# Patient Record
Sex: Male | Born: 1970 | Race: White | Hispanic: No | Marital: Single | Smoking: Current every day smoker
Health system: Southern US, Community
[De-identification: ages and names within clinical notes are randomized; demographics above are authoritative.]

## PROBLEM LIST (undated history)

## (undated) DIAGNOSIS — K219 Gastro-esophageal reflux disease without esophagitis: Secondary | ICD-10-CM

## (undated) DIAGNOSIS — G893 Neoplasm related pain (acute) (chronic): Secondary | ICD-10-CM

## (undated) DIAGNOSIS — C099 Malignant neoplasm of tonsil, unspecified: Secondary | ICD-10-CM

## (undated) HISTORY — DX: Neoplasm related pain (acute) (chronic): G89.3

## (undated) HISTORY — DX: Malignant neoplasm of tonsil, unspecified: C09.9

---

## 2006-04-09 ENCOUNTER — Emergency Department: Payer: Self-pay | Admitting: Emergency Medicine

## 2019-05-06 ENCOUNTER — Emergency Department: Payer: Self-pay

## 2019-05-06 ENCOUNTER — Emergency Department
Admission: EM | Admit: 2019-05-06 | Discharge: 2019-05-06 | Disposition: A | Discharge status: Home / Self Care | Payer: Self-pay | Attending: Emergency Medicine | Admitting: Emergency Medicine

## 2019-05-06 ENCOUNTER — Other Ambulatory Visit: Payer: Self-pay

## 2019-05-06 DIAGNOSIS — F10920 Alcohol use, unspecified with intoxication, uncomplicated: Secondary | ICD-10-CM

## 2019-05-06 DIAGNOSIS — Y907 Blood alcohol level of 200-239 mg/100 ml: Secondary | ICD-10-CM | POA: Insufficient documentation

## 2019-05-06 LAB — URINE DRUG SCREEN, QUALITATIVE (ARMC ONLY)
Amphetamines, Ur Screen: NOT DETECTED
Barbiturates, Ur Screen: NOT DETECTED
Benzodiazepine, Ur Scrn: NOT DETECTED
Cannabinoid 50 Ng, Ur ~~LOC~~: POSITIVE — AB
Cocaine Metabolite,Ur ~~LOC~~: NOT DETECTED
MDMA (Ecstasy)Ur Screen: NOT DETECTED
Methadone Scn, Ur: NOT DETECTED
Opiate, Ur Screen: NOT DETECTED
Phencyclidine (PCP) Ur S: NOT DETECTED
Tricyclic, Ur Screen: NOT DETECTED

## 2019-05-06 LAB — CBC WITH DIFFERENTIAL/PLATELET
Abs Immature Granulocytes: 0.04 10*3/uL (ref 0.00–0.07)
Basophils Absolute: 0.1 10*3/uL (ref 0.0–0.1)
Basophils Relative: 1 %
Eosinophils Absolute: 0.2 10*3/uL (ref 0.0–0.5)
Eosinophils Relative: 2 %
HCT: 43.7 % (ref 39.0–52.0)
Hemoglobin: 15.7 g/dL (ref 13.0–17.0)
Immature Granulocytes: 1 %
Lymphocytes Relative: 33 %
Lymphs Abs: 2.7 10*3/uL (ref 0.7–4.0)
MCH: 33.7 pg (ref 26.0–34.0)
MCHC: 35.9 g/dL (ref 30.0–36.0)
MCV: 93.8 fL (ref 80.0–100.0)
Monocytes Absolute: 0.6 10*3/uL (ref 0.1–1.0)
Monocytes Relative: 8 %
Neutro Abs: 4.7 10*3/uL (ref 1.7–7.7)
Neutrophils Relative %: 55 %
Platelets: 223 10*3/uL (ref 150–400)
RBC: 4.66 MIL/uL (ref 4.22–5.81)
RDW: 12.4 % (ref 11.5–15.5)
WBC: 8.2 10*3/uL (ref 4.0–10.5)
nRBC: 0 % (ref 0.0–0.2)

## 2019-05-06 LAB — URINALYSIS, COMPLETE (UACMP) WITH MICROSCOPIC
Bacteria, UA: NONE SEEN
Bilirubin Urine: NEGATIVE
Glucose, UA: NEGATIVE mg/dL
Hgb urine dipstick: NEGATIVE
Ketones, ur: NEGATIVE mg/dL
Leukocytes,Ua: NEGATIVE
Nitrite: NEGATIVE
Protein, ur: NEGATIVE mg/dL
Specific Gravity, Urine: 1.002 — ABNORMAL LOW (ref 1.005–1.030)
Squamous Epithelial / HPF: NONE SEEN (ref 0–5)
WBC, UA: NONE SEEN WBC/hpf (ref 0–5)
pH: 6 (ref 5.0–8.0)

## 2019-05-06 LAB — COMPREHENSIVE METABOLIC PANEL
ALT: 15 U/L (ref 0–44)
AST: 24 U/L (ref 15–41)
Albumin: 4.3 g/dL (ref 3.5–5.0)
Alkaline Phosphatase: 58 U/L (ref 38–126)
Anion gap: 13 (ref 5–15)
BUN: 6 mg/dL (ref 6–20)
CO2: 22 mmol/L (ref 22–32)
Calcium: 8.3 mg/dL — ABNORMAL LOW (ref 8.9–10.3)
Chloride: 104 mmol/L (ref 98–111)
Creatinine, Ser: 0.75 mg/dL (ref 0.61–1.24)
GFR calc Af Amer: >60 mL/min (ref >60)
GFR calc non Af Amer: >60 mL/min (ref >60)
Glucose, Bld: 109 mg/dL — ABNORMAL HIGH (ref 70–99)
Potassium: 2.5 mmol/L — CL (ref 3.5–5.1)
Sodium: 139 mmol/L (ref 135–145)
Total Bilirubin: 0.6 mg/dL (ref 0.3–1.2)
Total Protein: 7 g/dL (ref 6.5–8.1)

## 2019-05-06 LAB — ETHANOL: Alcohol, Ethyl (B): 222 mg/dL — ABNORMAL HIGH (ref <10)

## 2019-05-06 LAB — GLUCOSE, CAPILLARY: Glucose-Capillary: 121 mg/dL — ABNORMAL HIGH (ref 70–99)

## 2019-05-06 LAB — LACTIC ACID, PLASMA
Lactic Acid, Venous: 2.2 mmol/L (ref 0.5–1.9)
Lactic Acid, Venous: 1.2 mmol/L (ref 0.5–1.9)

## 2019-05-06 LAB — TROPONIN I (HIGH SENSITIVITY): Troponin I (High Sensitivity): 4 ng/L (ref <18)

## 2019-05-06 LAB — SALICYLATE LEVEL: Salicylate Lvl: <7 mg/dL (ref 2.8–30.0)

## 2019-05-06 LAB — ACETAMINOPHEN LEVEL: Acetaminophen (Tylenol), Serum: <10 ug/mL — ABNORMAL LOW (ref 10–30)

## 2019-05-06 MED ORDER — THIAMINE HCL 100 MG/ML IJ SOLN
Freq: Once | INTRAVENOUS | Status: AC
  Administered 2019-05-06: 05:00:00 via INTRAVENOUS
  Filled 2019-05-06: qty 1000

## 2019-05-06 MED ORDER — POTASSIUM CHLORIDE 20 MEQ/15ML (10%) PO SOLN
40.0000 meq | Freq: Once | ORAL | Status: AC
  Administered 2019-05-06: 06:00:00 40 meq via ORAL
  Filled 2019-05-06: qty 30

## 2019-05-06 MED ORDER — NALOXONE HCL 2 MG/2ML IJ SOSY
1.0000 mg | PREFILLED_SYRINGE | Freq: Once | INTRAMUSCULAR | Status: AC
  Administered 2019-05-06: 03:00:00 1 mg via INTRAVENOUS
  Filled 2019-05-06: qty 2

## 2019-05-06 MED ORDER — NALOXONE HCL 2 MG/2ML IJ SOSY
2.0000 mg | PREFILLED_SYRINGE | Freq: Once | INTRAMUSCULAR | Status: AC
  Administered 2019-05-06: 03:00:00 2 mg via INTRAVENOUS

## 2019-05-06 MED ORDER — THIAMINE HCL 100 MG/ML IJ SOLN
100.0000 mg | Freq: Once | INTRAMUSCULAR | Status: AC
  Administered 2019-05-06: 04:00:00 100 mg via INTRAVENOUS

## 2019-05-06 MED ORDER — SODIUM CHLORIDE 0.9 % IV BOLUS
1000.0000 mL | Freq: Once | INTRAVENOUS | Status: AC
  Administered 2019-05-06: 03:00:00 1000 mL via INTRAVENOUS

## 2019-05-06 MED ORDER — POTASSIUM CHLORIDE 10 MEQ/100ML IV SOLN
10.0000 meq | Freq: Once | INTRAVENOUS | Status: AC
  Administered 2019-05-06: 05:00:00 10 meq via INTRAVENOUS
  Filled 2019-05-06: qty 100

## 2019-05-06 MED ORDER — NALOXONE HCL 2 MG/2ML IJ SOSY
PREFILLED_SYRINGE | INTRAMUSCULAR | Status: AC
  Filled 2019-05-06: qty 2

## 2019-05-06 MED ORDER — POTASSIUM CHLORIDE 10 MEQ/100ML IV SOLN
10.0000 meq | Freq: Once | INTRAVENOUS | Status: AC
  Administered 2019-05-06: 06:00:00 10 meq via INTRAVENOUS
  Filled 2019-05-06: qty 100

## 2019-05-06 NOTE — ED Notes (Signed)
Pt refuses to get blood drawn. Pt st "I have to get out of here now". This Rn explained pt he has half a bag of infusion running at this time. Pt st 'I don't care I'm leaving now". MD Jimmye Norman made aware.

## 2019-05-06 NOTE — Discharge Instructions (Addendum)
Please be careful.  You passed out in the bathtub.  He could have hit your head and possibly even kill yourself.  Do not drink so much.

## 2019-05-06 NOTE — ED Notes (Signed)
Patient awake and alert x 4. Patient unable to recall how he got to ED.

## 2019-05-06 NOTE — ED Notes (Signed)
This RN walks into pt's room. Pt removed his IV and standing at the door at this time. Pt refuses to sing DC. Pt with DC documents on hand. This RN has offered to provide disposable blue scrub top, pt refuses and walks out the room w/o a shirt on.

## 2019-05-06 NOTE — ED Triage Notes (Signed)
Pt to ED via EMS from motel. Pt found in bathtub unresponsive, no water in tub. Pt is breathing wnl, pt opened eyes when being moved from ems stretcher to ed stretcher. CBG 121, per ems pt with male at motel, pt arrives with scratches on his neck, per ems male stated pt bit male. Unknown if any drugs or alcohol were used at this time.

## 2019-05-06 NOTE — ED Provider Notes (Signed)
[facility] Emergency Department Provider Note   ____________________________________________   First MD Initiated Contact with Patient 05/06/19 0153     (approximate)  I have reviewed the triage vital signs and the nursing notes.   HISTORY  Chief Complaint Drug Overdose    HPI Paul Hebert is a 48 y.o. male brought in by EMS.  He was found sitting across the bathtub and members motor large.  He has some scratches on his neck apparently from his girlfriend is what EMS heard.  Patient CBG here in the emergency room was 121.  He is breathing responsive only to deep pain with moaning.  He does not have any apparent bumps on his head.         History reviewed. No pertinent past medical history.  There are no active problems to display for this patient.   History reviewed. No pertinent surgical history.  Prior to Admission medications   Not on File    Allergies Patient has no allergy information on record.  No family history on file.  Social History Social History   Tobacco Use  . Smoking status: Unknown If Ever Smoked  Substance Use Topics  . Alcohol use: Not Currently  . Drug use: Not Currently    Review of Systems   ____________________________________________   PHYSICAL EXAM:  VITAL SIGNS: ED Triage Vitals  Enc Vitals Group     BP      Pulse      Resp      Temp      Temp src      SpO2      Weight      Height      Head Circumference      Peak Flow      Pain Score      Pain Loc      Pain Edu?      Excl. in [place]?     Constitutional: Sleeping and not responsive Eyes: Conjunctivae are normal.  Disconjugate gaze pupils small round reactive Head: Atraumatic. Nose: No congestion/rhinnorhea. Mouth/Throat: Mucous membranes are moist.  Oropharynx non-erythematous. Neck: No stridor. Cardiovascular: Normal rate, regular rhythm. Grossly normal heart sounds.  Good peripheral circulation. Respiratory: Normal respiratory  effort.  No retractions. Lungs CTAB. Gastrointestinal: Soft and nontender. No distention. No abdominal bruits. No CVA tenderness. Musculoskeletal: No lower extremity tenderness nor edema.  Neurologic: Unable to get good exam due to being unresponsive Skin:  Skin is warm, dry and intact. No rash noted.   ____________________________________________   LABS (all labs ordered are listed, but only abnormal results are displayed)  Labs Reviewed  ACETAMINOPHEN LEVEL - Abnormal; Notable for the following components:      Result Value   Acetaminophen (Tylenol), Serum <10 (*)    All other components within normal limits  COMPREHENSIVE METABOLIC PANEL - Abnormal; Notable for the following components:   Potassium 2.5 (*)    Glucose, Bld 109 (*)    Calcium 8.3 (*)    All other components within normal limits  ETHANOL - Abnormal; Notable for the following components:   Alcohol, Ethyl (B) 222 (*)    All other components within normal limits  LACTIC ACID, PLASMA - Abnormal; Notable for the following components:   Lactic Acid, Venous 2.2 (*)    All other components within normal limits  GLUCOSE, CAPILLARY - Abnormal; Notable for the following components:   Glucose-Capillary 121 (*)    All other components within normal limits  SALICYLATE  LEVEL  CBC WITH DIFFERENTIAL/PLATELET  LACTIC ACID, PLASMA  URINALYSIS, COMPLETE (UACMP) WITH MICROSCOPIC  URINE DRUG SCREEN, QUALITATIVE ([facility] ONLY)  CBG MONITORING, ED  TROPONIN I (HIGH SENSITIVITY)  TROPONIN I (HIGH SENSITIVITY)   ____________________________________________  EKG  EKG read interpreted by me shows normal sinus rhythm rate of 85 normal axis essentially normal EKG ____________________________________________  RADIOLOGY  ED MD interpretation: CT of the head neck and chest x-ray read by radiology reviewed by me showed no acute pathology  Official radiology report(s): Ct Head Wo Contrast  Result Date: 05/06/2019 CLINICAL DATA:   Found unresponsive in hotel bath tub. EXAM: CT HEAD WITHOUT CONTRAST CT CERVICAL SPINE WITHOUT CONTRAST TECHNIQUE: Multidetector CT imaging of the head and cervical spine was performed following the standard protocol without intravenous contrast. Multiplanar CT image reconstructions of the cervical spine were also generated. COMPARISON:  None. FINDINGS: CT HEAD FINDINGS Brain: There is no mass, hemorrhage or extra-axial collection. The size and configuration of the ventricles and extra-axial CSF spaces are normal. The brain parenchyma is normal, without evidence of acute or chronic infarction. Vascular: No abnormal hyperdensity of the major intracranial arteries or dural venous sinuses. No intracranial atherosclerosis. Skull: The visualized skull base, calvarium and extracranial soft tissues are normal. Sinuses/Orbits: No fluid levels or advanced mucosal thickening of the visualized paranasal sinuses. No mastoid or middle ear effusion. The orbits are normal. CT CERVICAL SPINE FINDINGS Alignment: No static subluxation. Facets are aligned. Occipital condyles are normally positioned. Skull base and vertebrae: No acute fracture. Soft tissues and spinal canal: No prevertebral fluid or swelling. No visible canal hematoma. Disc levels: No advanced spinal canal or neural foraminal stenosis. Upper chest: No pneumothorax, pulmonary nodule or pleural effusion. Other: Normal visualized paraspinal cervical soft tissues. IMPRESSION: Normal head and cervical spine. Electronically Signed   By: Ulyses Jarred M.D.   On: 05/06/2019 02:36   Ct Cervical Spine Wo Contrast  Result Date: 05/06/2019 CLINICAL DATA:  Found unresponsive in hotel bath tub. EXAM: CT HEAD WITHOUT CONTRAST CT CERVICAL SPINE WITHOUT CONTRAST TECHNIQUE: Multidetector CT imaging of the head and cervical spine was performed following the standard protocol without intravenous contrast. Multiplanar CT image reconstructions of the cervical spine were also generated.  COMPARISON:  None. FINDINGS: CT HEAD FINDINGS Brain: There is no mass, hemorrhage or extra-axial collection. The size and configuration of the ventricles and extra-axial CSF spaces are normal. The brain parenchyma is normal, without evidence of acute or chronic infarction. Vascular: No abnormal hyperdensity of the major intracranial arteries or dural venous sinuses. No intracranial atherosclerosis. Skull: The visualized skull base, calvarium and extracranial soft tissues are normal. Sinuses/Orbits: No fluid levels or advanced mucosal thickening of the visualized paranasal sinuses. No mastoid or middle ear effusion. The orbits are normal. CT CERVICAL SPINE FINDINGS Alignment: No static subluxation. Facets are aligned. Occipital condyles are normally positioned. Skull base and vertebrae: No acute fracture. Soft tissues and spinal canal: No prevertebral fluid or swelling. No visible canal hematoma. Disc levels: No advanced spinal canal or neural foraminal stenosis. Upper chest: No pneumothorax, pulmonary nodule or pleural effusion. Other: Normal visualized paraspinal cervical soft tissues. IMPRESSION: Normal head and cervical spine. Electronically Signed   By: Ulyses Jarred M.D.   On: 05/06/2019 02:36   Dg Chest Portable 1 View  Result Date: 05/06/2019 CLINICAL DATA:  Suspected drug overdose EXAM: PORTABLE CHEST 1 VIEW COMPARISON:  None. FINDINGS: The heart size and mediastinal contours are within normal limits. Both lungs are clear. The visualized skeletal  structures are unremarkable. IMPRESSION: No active disease. Electronically Signed   By: Ulyses Jarred M.D.   On: 05/06/2019 02:37    ____________________________________________   PROCEDURES  Procedure(s) performed (including Critical Care):  Procedures   ____________________________________________   INITIAL IMPRESSION / ASSESSMENT AND PLAN / ED COURSE  Patient does wake up later.  He understands that he has had a little bit too much to drink  after went for a little bit unless he can get a ride.    Patient's hypokalemia should be treated with p.o. and IV potassium.    SANG BLOUNT was evaluated in Emergency Department on 05/06/2019 for the symptoms described in the history of present illness. He was evaluated in the context of the global COVID-19 pandemic, which necessitated consideration that the patient might be at risk for infection with the SARS-CoV-2 virus that causes COVID-19. Institutional protocols and algorithms that pertain to the evaluation of patients at risk for COVID-19 are in a state of rapid change based on information released by regulatory bodies including the CDC and federal and state organizations. These policies and algorithms were followed during the patient's care in the ED.      ____________________________________________   FINAL CLINICAL IMPRESSION(S) / ED DIAGNOSES  Final diagnoses:  Alcoholic intoxication without complication [facility])     ED Discharge Orders    None       Note:  This document was prepared using Dragon voice recognition software and may include unintentional dictation errors.    Nena Polio, MD 05/06/19 [phone]

## 2019-05-06 NOTE — ED Notes (Signed)
Patient snoring when this writer came into room. 2 mg Narcan given with no effect.

## 2019-05-06 NOTE — ED Notes (Signed)
Patient woke up when this writer unhooked IV fluids. Writer asked patient what medications or drug he took. Patient states might have took Suboxone.

## 2019-08-16 ENCOUNTER — Encounter: Payer: Self-pay | Admitting: Emergency Medicine

## 2019-08-16 ENCOUNTER — Emergency Department: Payer: Self-pay

## 2019-08-16 ENCOUNTER — Other Ambulatory Visit: Payer: Self-pay

## 2019-08-16 ENCOUNTER — Emergency Department
Admission: EM | Admit: 2019-08-16 | Discharge: 2019-08-16 | Disposition: A | Discharge status: Home / Self Care | Payer: Self-pay | Attending: Emergency Medicine | Admitting: Emergency Medicine

## 2019-08-16 DIAGNOSIS — F17210 Nicotine dependence, cigarettes, uncomplicated: Secondary | ICD-10-CM | POA: Insufficient documentation

## 2019-08-16 DIAGNOSIS — L03011 Cellulitis of right finger: Secondary | ICD-10-CM | POA: Insufficient documentation

## 2019-08-16 LAB — COMPREHENSIVE METABOLIC PANEL
ALT: 16 U/L (ref 0–44)
AST: 19 U/L (ref 15–41)
Albumin: 4.2 g/dL (ref 3.5–5.0)
Alkaline Phosphatase: 63 U/L (ref 38–126)
Anion gap: 9 (ref 5–15)
BUN: 9 mg/dL (ref 6–20)
CO2: 30 mmol/L (ref 22–32)
Calcium: 9.4 mg/dL (ref 8.9–10.3)
Chloride: 98 mmol/L (ref 98–111)
Creatinine, Ser: 0.83 mg/dL (ref 0.61–1.24)
GFR calc Af Amer: >60 mL/min (ref >60)
GFR calc non Af Amer: >60 mL/min (ref >60)
Glucose, Bld: 109 mg/dL — ABNORMAL HIGH (ref 70–99)
Potassium: 4.5 mmol/L (ref 3.5–5.1)
Sodium: 137 mmol/L (ref 135–145)
Total Bilirubin: 0.6 mg/dL (ref 0.3–1.2)
Total Protein: 7.5 g/dL (ref 6.5–8.1)

## 2019-08-16 LAB — CBC WITH DIFFERENTIAL/PLATELET
Abs Immature Granulocytes: 0.03 10*3/uL (ref 0.00–0.07)
Basophils Absolute: 0 10*3/uL (ref 0.0–0.1)
Basophils Relative: 0 %
Eosinophils Absolute: 0.2 10*3/uL (ref 0.0–0.5)
Eosinophils Relative: 2 %
HCT: 42.3 % (ref 39.0–52.0)
Hemoglobin: 14.8 g/dL (ref 13.0–17.0)
Immature Granulocytes: 0 %
Lymphocytes Relative: 18 %
Lymphs Abs: 1.7 10*3/uL (ref 0.7–4.0)
MCH: 33.5 pg (ref 26.0–34.0)
MCHC: 35 g/dL (ref 30.0–36.0)
MCV: 95.7 fL (ref 80.0–100.0)
Monocytes Absolute: 0.6 10*3/uL (ref 0.1–1.0)
Monocytes Relative: 6 %
Neutro Abs: 6.7 10*3/uL (ref 1.7–7.7)
Neutrophils Relative %: 74 %
Platelets: 253 10*3/uL (ref 150–400)
RBC: 4.42 MIL/uL (ref 4.22–5.81)
RDW: 12.5 % (ref 11.5–15.5)
WBC: 9.2 10*3/uL (ref 4.0–10.5)
nRBC: 0 % (ref 0.0–0.2)

## 2019-08-16 LAB — LACTIC ACID, PLASMA: Lactic Acid, Venous: 0.9 mmol/L (ref 0.5–1.9)

## 2019-08-16 MED ORDER — CLINDAMYCIN PHOSPHATE 600 MG/50ML IV SOLN: 600.0000 mg | Freq: Once | INTRAVENOUS | Status: DC

## 2019-08-16 MED ORDER — SODIUM CHLORIDE 0.9 % IV SOLN
1.0000 g | Freq: Once | INTRAVENOUS | Status: AC
  Administered 2019-08-16: 1 g via INTRAVENOUS
  Filled 2019-08-16: qty 10

## 2019-08-16 MED ORDER — KETOROLAC TROMETHAMINE 30 MG/ML IJ SOLN
30.0000 mg | Freq: Once | INTRAMUSCULAR | Status: AC
  Administered 2019-08-16: 30 mg via INTRAVENOUS
  Filled 2019-08-16: qty 1

## 2019-08-16 MED ORDER — LIDOCAINE 5 % EX PTCH: 1.0000 | MEDICATED_PATCH | CUTANEOUS | Status: DC

## 2019-08-16 MED ORDER — CEPHALEXIN 500 MG PO CAPS: 500.0000 mg | ORAL_CAPSULE | Freq: Four times a day (QID) | ORAL | 0 refills | Status: AC

## 2019-08-16 MED ORDER — DOXYCYCLINE MONOHYDRATE 100 MG PO CAPS: 100.0000 mg | ORAL_CAPSULE | Freq: Two times a day (BID) | ORAL | 0 refills | Status: DC

## 2019-08-16 MED ORDER — IBUPROFEN 600 MG PO TABS: 600.0000 mg | ORAL_TABLET | Freq: Once | ORAL | Status: DC

## 2019-08-16 MED ORDER — NAPROXEN 500 MG PO TABS: 500.0000 mg | ORAL_TABLET | Freq: Two times a day (BID) | ORAL | Status: DC

## 2019-08-16 NOTE — ED Triage Notes (Signed)
Here for wound to right 4th proximal digit.  Sever pain per pt radiating up to elbow.  Redness and swelling extend from 4th digit to wrist.  Started 2 days ago.  Pt unsure if got bit by anything. No known fevers.

## 2019-08-16 NOTE — ED Notes (Signed)
See triage note  States he thought he had an infected hair to finger  States finger is more swollen and tender today

## 2019-08-16 NOTE — ED Provider Notes (Signed)
[facility] Emergency Department Provider Note   ____________________________________________   First MD Initiated Contact with Patient 08/16/19 1142     (approximate)  I have reviewed the triage vital signs and the nursing notes.   HISTORY  Chief Complaint Hand Problem    HPI Paul Hebert is a 48 y.o. male patient presents with edema and erythema to the right hand.  Patient has ulcer to the dorsal aspect of the proximal fourth digit of the right hand.  Edema and erythema spreading to the right wrist.  Patient states there was a bump on his hand 2 days ago.  Patient state he ruptured the lesion and tried to express material out with minimal success.  Patient state after rupture increased pain and edema.  Patient denies loss of sensation or loss of function.  Patient denies fever associated with complaint.  Patient is right-hand dominant.  Patient rates his pain as 8/10.  Patient described the pain as "sore".  No palliative measure for complaint.         History reviewed. No pertinent past medical history.  There are no active problems to display for this patient.   History reviewed. No pertinent surgical history.  Prior to Admission medications   Medication Sig Start Date End Date Taking? Authorizing Provider  cephALEXin (KEFLEX) 500 MG capsule Take 1 capsule (500 mg total) by mouth 4 (four) times daily for 10 days. 08/16/19 08/26/19  Sable Feil, PA-C  doxycycline (MONODOX) 100 MG capsule Take 1 capsule (100 mg total) by mouth 2 (two) times daily. 08/16/19   Sable Feil, PA-C  naproxen (NAPROSYN) 500 MG tablet Take 1 tablet (500 mg total) by mouth 2 (two) times daily with a meal. 08/16/19   Sable Feil, PA-C    Allergies Patient has no known allergies.  History reviewed. No pertinent family history.  Social History Social History   Tobacco Use  . Smoking status: Current Every Day Smoker  . Smokeless tobacco: Never Used   Substance Use Topics  . Alcohol use: Not Currently  . Drug use: Not Currently    Review of Systems Constitutional: No fever/chills Eyes: No visual changes. ENT: No sore throat. Cardiovascular: Denies chest pain. Respiratory: Denies shortness of breath. Gastrointestinal: No abdominal pain.  No nausea, no vomiting.  No diarrhea.  No constipation. Genitourinary: Negative for dysuria. Musculoskeletal: Negative for back pain. Skin: Negative for rash.  Redness and swelling to the fourth digit right hand. Neurological: Negative for headaches, focal weakness or numbness.   ____________________________________________   PHYSICAL EXAM:  VITAL SIGNS: ED Triage Vitals [08/16/19 1036]  Enc Vitals Group     BP (!) 141/84     Pulse Rate (!) 104     Resp 20     Temp 98.6 F (37 C)     Temp Source Oral     SpO2 97 %     Weight 160 lb (72.6 kg)     Height 5\' 10"  (1.778 m)     Head Circumference      Peak Flow      Pain Score 8     Pain Loc      Pain Edu?      Excl. in [address]?    Constitutional: Alert and oriented. Well appearing and in no acute distress. Hematological/Lymphatic/Immunilogical: No cervical lymphadenopathy. Cardiovascular: Normal rate, regular rhythm. Grossly normal heart sounds.  Good peripheral circulation. Respiratory: Normal respiratory effort.  No retractions. Lungs CTAB. Neurologic:  Normal  speech and language. No gross focal neurologic deficits are appreciated. No gait instability. Skin: Edema and erythema to fourth digit right hand spreading to right wrist. Psychiatric: Mood and affect are normal. Speech and behavior are normal.  ____________________________________________   LABS (all labs ordered are listed, but only abnormal results are displayed)  Labs Reviewed  COMPREHENSIVE METABOLIC PANEL - Abnormal; Notable for the following components:      Result Value   Glucose, Bld 109 (*)    All other components within normal limits  LACTIC ACID, PLASMA  CBC  WITH DIFFERENTIAL/PLATELET  LACTIC ACID, PLASMA   ____________________________________________  EKG   ____________________________________________  RADIOLOGY  ED MD interpretation:    Official radiology report(s): Dg Hand Complete Right  Result Date: 08/16/2019 CLINICAL DATA:  Wound posterior to the right ring finger.  Swelling. EXAM: RIGHT HAND - COMPLETE 3+ VIEW COMPARISON:  None. FINDINGS: No fractures or dislocations. Soft tissue swelling is seen over the dorsum of the hand. No foreign body seen near the base of the fourth finger. A punctate focus of high attenuation projects over the distal soft tissues of the fourth finger. IMPRESSION: 1. No fractures. 2. No foreign bodies near the base of the fourth finger, at the region of soft tissue swelling. 3. Small round focus of high attenuation projected over the distal soft tissues of the fourth finger may represent an age indeterminate foreign body or something on the patient or under the fingernail. Electronically Signed   By: Dorise Bullion III M.D   On: 08/16/2019 12:00    ____________________________________________   PROCEDURES  Procedure(s) performed (including Critical Care):  Procedures   ____________________________________________   INITIAL IMPRESSION / ASSESSMENT AND PLAN / ED COURSE  As part of my medical decision making, I reviewed the following data within the [place] was evaluated in Emergency Department on 08/16/2019 for the symptoms described in the history of present illness. He was evaluated in the context of the global COVID-19 pandemic, which necessitated consideration that the patient might be at risk for infection with the SARS-CoV-2 virus that causes COVID-19. Institutional protocols and algorithms that pertain to the evaluation of patients at risk for COVID-19 are in a state of rapid change based on information released by regulatory bodies including the CDC  and federal and state organizations. These policies and algorithms were followed during the patient's care in the ED.  Patient presents with cellulitis to the right hand secondary to infected fourth digit of the right hand.  Patient given IV Rocephin.  Patient given discharge care instructions.  Patient will be discharged with Keflex and doxycycline.  Patient advised [facility].      ____________________________________________   FINAL CLINICAL IMPRESSION(S) / ED DIAGNOSES  Final diagnoses:  Cellulitis of finger of right hand     ED Discharge Orders         Ordered    cephALEXin (KEFLEX) 500 MG capsule  4 times daily     08/16/19 1256    doxycycline (MONODOX) 100 MG capsule  2 times daily     08/16/19 1256    naproxen (NAPROSYN) 500 MG tablet  2 times daily with meals     08/16/19 1256           Note:  This document was prepared using Dragon voice recognition software and may include unintentional dictation errors.    Sable Feil, PA-C 08/16/19  [place] Cameron, MD 08/16/19 2005

## 2019-08-16 NOTE — ED Notes (Signed)
First Nurse Note: Pt ED stating that he is having swelling and pain in his right hand and arm. Pt is in NAD.

## 2019-08-16 NOTE — Discharge Instructions (Addendum)
Follow discharge care instruction take medication as directed. °

## 2019-08-26 ENCOUNTER — Encounter: Payer: Self-pay | Admitting: Emergency Medicine

## 2019-08-26 ENCOUNTER — Emergency Department
Admission: EM | Admit: 2019-08-26 | Discharge: 2019-08-26 | Disposition: A | Discharge status: Home / Self Care | Payer: Self-pay | Attending: Student | Admitting: Student

## 2019-08-26 ENCOUNTER — Other Ambulatory Visit: Payer: Self-pay

## 2019-08-26 DIAGNOSIS — Y92810 Car as the place of occurrence of the external cause: Secondary | ICD-10-CM | POA: Insufficient documentation

## 2019-08-26 DIAGNOSIS — S0181XA Laceration without foreign body of other part of head, initial encounter: Secondary | ICD-10-CM | POA: Insufficient documentation

## 2019-08-26 DIAGNOSIS — R451 Restlessness and agitation: Secondary | ICD-10-CM | POA: Insufficient documentation

## 2019-08-26 DIAGNOSIS — Y9389 Activity, other specified: Secondary | ICD-10-CM | POA: Insufficient documentation

## 2019-08-26 DIAGNOSIS — Y999 Unspecified external cause status: Secondary | ICD-10-CM | POA: Insufficient documentation

## 2019-08-26 DIAGNOSIS — W2209XA Striking against other stationary object, initial encounter: Secondary | ICD-10-CM | POA: Insufficient documentation

## 2019-08-26 DIAGNOSIS — Z23 Encounter for immunization: Secondary | ICD-10-CM | POA: Insufficient documentation

## 2019-08-26 DIAGNOSIS — Z008 Encounter for other general examination: Secondary | ICD-10-CM

## 2019-08-26 DIAGNOSIS — F17210 Nicotine dependence, cigarettes, uncomplicated: Secondary | ICD-10-CM | POA: Insufficient documentation

## 2019-08-26 MED ORDER — TETANUS-DIPHTH-ACELL PERTUSSIS 5-2.5-18.5 LF-MCG/0.5 IM SUSP
0.5000 mL | Freq: Once | INTRAMUSCULAR | Status: AC
  Administered 2019-08-26: 19:00:00 0.5 mL via INTRAMUSCULAR
  Filled 2019-08-26: qty 0.5
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
## 2019-08-26 NOTE — Discharge Instructions (Addendum)
Patient sustained a laceration to the forehead.  This shallow and does not need sutures. The laceration is closed with derma-clips.  These will fall off in 5 to 7 days.  No further treatment necessary of the wound is needed.  Should the patient should remove the clips prior to the 5 to 7 days, cover the area with a dressing/Band-Aids. TDAP was administered today. No medications at this time.  Patient is cleared for incarceration.

## 2020-01-16 ENCOUNTER — Emergency Department (HOSPITAL_COMMUNITY)
Admission: EM | Admit: 2020-01-16 | Discharge: 2020-01-16 | Disposition: A | Discharge status: Home / Self Care | Payer: Self-pay | Attending: Emergency Medicine | Admitting: Emergency Medicine

## 2020-01-16 ENCOUNTER — Other Ambulatory Visit: Payer: Self-pay

## 2020-01-16 ENCOUNTER — Encounter (HOSPITAL_COMMUNITY): Payer: Self-pay | Admitting: Emergency Medicine

## 2020-01-16 ENCOUNTER — Emergency Department (HOSPITAL_COMMUNITY): Payer: Self-pay

## 2020-01-16 DIAGNOSIS — F17210 Nicotine dependence, cigarettes, uncomplicated: Secondary | ICD-10-CM | POA: Insufficient documentation

## 2020-01-16 DIAGNOSIS — M542 Cervicalgia: Secondary | ICD-10-CM | POA: Insufficient documentation

## 2020-01-16 LAB — CBC WITH DIFFERENTIAL/PLATELET
Abs Immature Granulocytes: 0.01 10*3/uL (ref 0.00–0.07)
Basophils Absolute: 0 10*3/uL (ref 0.0–0.1)
Basophils Relative: 1 %
Eosinophils Absolute: 0.2 10*3/uL (ref 0.0–0.5)
Eosinophils Relative: 4 %
HCT: 40.2 % (ref 39.0–52.0)
Hemoglobin: 14.1 g/dL (ref 13.0–17.0)
Immature Granulocytes: 0 %
Lymphocytes Relative: 38 %
Lymphs Abs: 2.3 10*3/uL (ref 0.7–4.0)
MCH: 33.5 pg (ref 26.0–34.0)
MCHC: 35.1 g/dL (ref 30.0–36.0)
MCV: 95.5 fL (ref 80.0–100.0)
Monocytes Absolute: 0.6 10*3/uL (ref 0.1–1.0)
Monocytes Relative: 9 %
Neutro Abs: 3 10*3/uL (ref 1.7–7.7)
Neutrophils Relative %: 48 %
Platelets: 192 10*3/uL (ref 150–400)
RBC: 4.21 MIL/uL — ABNORMAL LOW (ref 4.22–5.81)
RDW: 12.1 % (ref 11.5–15.5)
WBC: 6.1 10*3/uL (ref 4.0–10.5)
nRBC: 0 % (ref 0.0–0.2)

## 2020-01-16 LAB — COMPREHENSIVE METABOLIC PANEL
ALT: 17 U/L (ref 0–44)
AST: 18 U/L (ref 15–41)
Albumin: 4.3 g/dL (ref 3.5–5.0)
Alkaline Phosphatase: 62 U/L (ref 38–126)
Anion gap: 9 (ref 5–15)
BUN: 11 mg/dL (ref 6–20)
CO2: 27 mmol/L (ref 22–32)
Calcium: 9.1 mg/dL (ref 8.9–10.3)
Chloride: 107 mmol/L (ref 98–111)
Creatinine, Ser: 0.79 mg/dL (ref 0.61–1.24)
GFR calc Af Amer: >60 mL/min (ref >60)
GFR calc non Af Amer: >60 mL/min (ref >60)
Glucose, Bld: 86 mg/dL (ref 70–99)
Potassium: 3.9 mmol/L (ref 3.5–5.1)
Sodium: 143 mmol/L (ref 135–145)
Total Bilirubin: 0.6 mg/dL (ref 0.3–1.2)
Total Protein: 7.4 g/dL (ref 6.5–8.1)

## 2020-01-16 MED ORDER — SODIUM CHLORIDE 0.9 % IV BOLUS
1000.0000 mL | Freq: Once | INTRAVENOUS | Status: AC
  Administered 2020-01-16: 1000 mL via INTRAVENOUS

## 2020-01-16 MED ORDER — IOHEXOL 300 MG/ML  SOLN
75.0000 mL | Freq: Once | INTRAMUSCULAR | Status: AC | PRN
  Administered 2020-01-16: 75 mL via INTRAVENOUS

## 2020-01-16 MED ORDER — SODIUM CHLORIDE (PF) 0.9 % IJ SOLN
INTRAMUSCULAR | Status: AC
  Filled 2020-01-16: qty 50

## 2020-01-16 NOTE — Discharge Instructions (Signed)
If you develop new or worsening neck pain, trouble breathing or swallowing, coughing up blood, fever, or any other new/concerning symptoms then return to the ER for evaluation.  It is important to establish care with a primary care physician for outpatient follow-up and further work-up.  One suggestion has been given to you.

## 2020-01-16 NOTE — ED Triage Notes (Signed)
Per pt, states he has a swollen gland, left neck-states pain radiates up into left ear, head-states symptoms for 4 months

## 2020-01-16 NOTE — ED Provider Notes (Signed)
[place] DEPT Provider Note   CSN: [id number] Arrival date & time: 01/16/20  1325     History Chief Complaint  Patient presents with  . Lymphadenopathy    Paul Hebert is a 49 y.o. male.  HPI 49 year old male presents with swollen left neck.  States he thinks it is lymph nodes.  Has been ongoing for about 4 months.  Getting bigger and more painful.  Now feels like a burning sensation.  He also has pain on the inside of his mouth and his cheek and left teeth.  Painful swallowing.  Longtime smoker and is worried about cancer.  Pain radiates into his head and ear on the left side.  No shortness of breath, fever, diaphoresis, or weight loss.  He has not noticed swelling/lymphadenopathy anywhere else.   History reviewed. No pertinent past medical history.  There are no problems to display for this patient.   History reviewed. No pertinent surgical history.     No family history on file.  Social History   Tobacco Use  . Smoking status: Current Every Day Smoker  . Smokeless tobacco: Never Used  Substance Use Topics  . Alcohol use: Yes  . Drug use: Not on file    Home Medications Prior to Admission medications   Medication Sig Start Date End Date Taking? Authorizing Provider  doxycycline (MONODOX) 100 MG capsule Take 1 capsule (100 mg total) by mouth 2 (two) times daily. 08/16/19   Sable Feil, PA-C  naproxen (NAPROSYN) 500 MG tablet Take 1 tablet (500 mg total) by mouth 2 (two) times daily with a meal. 08/16/19   Sable Feil, PA-C    Allergies    Patient has no known allergies.  Review of Systems   Review of Systems  Constitutional: Negative for diaphoresis, fever and unexpected weight change.  HENT: Positive for trouble swallowing.   Respiratory: Negative for shortness of breath.   Musculoskeletal: Positive for neck pain.  All other systems reviewed and are negative.   Physical Exam Updated Vital Signs BP (!) 142/90 (BP  Location: Right Arm)   Pulse 92   Temp 98.1 F (36.7 C) (Oral)   Resp 17   SpO2 100%   Physical Exam Vitals and nursing note reviewed.  Constitutional:      Appearance: He is well-developed.  HENT:     Head: Normocephalic and atraumatic.     Right Ear: Tympanic membrane and external ear normal.     Left Ear: External ear normal.     Nose: Nose normal.     Mouth/Throat:     Pharynx: No oropharyngeal exudate or posterior oropharyngeal erythema.     Comments: Overall poor dentition but no acute obvious intraoral abscess/infection. Eyes:     General:        Right eye: No discharge.        Left eye: No discharge.  Neck:     Comments: Mild lymphadenopathy to left anterior/lateral neck. Minimal tenderness. overall no significant swelling. Cardiovascular:     Rate and Rhythm: Normal rate and regular rhythm.     Heart sounds: Normal heart sounds.  Pulmonary:     Effort: Pulmonary effort is normal.     Breath sounds: Normal breath sounds.  Abdominal:     Palpations: Abdomen is soft.     Tenderness: There is no abdominal tenderness.  Musculoskeletal:     Cervical back: Neck supple.  Lymphadenopathy:     Cervical: Cervical adenopathy (left lateral neck,  small but palpable) present.  Skin:    General: Skin is warm and dry.  Neurological:     Mental Status: He is alert.  Psychiatric:        Mood and Affect: Mood is not anxious.     ED Results / Procedures / Treatments   Labs (all labs ordered are listed, but only abnormal results are displayed) Labs Reviewed  CBC WITH DIFFERENTIAL/PLATELET - Abnormal; Notable for the following components:      Result Value   RBC 4.21 (*)    All other components within normal limits  COMPREHENSIVE METABOLIC PANEL    EKG None  Radiology CT Soft Tissue Neck W Contrast  Result Date: 01/16/2020 CLINICAL DATA:  Lymphadenopathy, neck. Additional history provided: Patient reports left-sided neck swelling for 3-4 months. EXAM: CT NECK WITH  CONTRAST TECHNIQUE: Multidetector CT imaging of the neck was performed using the standard protocol following the bolus administration of intravenous contrast. CONTRAST:  71mL OMNIPAQUE IOHEXOL 300 MG/ML  SOLN COMPARISON:  05/06/2019 CT cervical spine FINDINGS: Pharynx and larynx: Streak artifact from dental restoration limits evaluation of the oral cavity. Within this limitation, no swelling or discrete mass is identified within the oral cavity, pharynx or larynx. Salivary glands: No inflammation, mass, or stone. Thyroid: Unremarkable. Lymph nodes: No pathologically enlarged cervical chain lymph nodes are identified. Vascular: The major vascular structures of the neck appear patent. Limited intracranial: No abnormality identified. Visualized orbits: Incompletely imaged. Visualized orbits demonstrate no acute abnormality. Mastoids and visualized paranasal sinuses: Scattered opacification of the ethmoid air cells at the imaged levels. No significant mastoid effusion. Skeleton: No acute bony abnormality or aggressive osseous lesion. Upper chest: No consolidation within the imaged lung apices. Right apical blebs. IMPRESSION: No soft tissue neck mass or pathologically enlarged cervical chain lymph nodes identified. Specifically, no abnormality is identified deep to the left upper neck skin surface marker to account for the reported palpable finding. Ethmoid sinusitis. Electronically Signed   By: Kellie Simmering DO   On: 01/16/2020 15:37    Procedures Procedures (including critical care time)  Medications Ordered in ED Medications  sodium chloride (PF) 0.9 % injection (has no administration in time range)  sodium chloride 0.9 % bolus 1,000 mL (1,000 mLs Intravenous New Bag/Given 01/16/20 1420)  iohexol (OMNIPAQUE) 300 MG/ML solution 75 mL (75 mLs Intravenous Contrast Given 01/16/20 1507)    ED Course  I have reviewed the triage vital signs and the nursing notes.  Pertinent labs & imaging results that were  available during my care of the patient were reviewed by me and considered in my medical decision making (see chart for details).    MDM Rules/Calculators/A&P                      Patient CT scan is reassuring.  Labs are overall unremarkable.  I can feel lymph nodes on the left side of his neck but they are not enlarged or with skin erythema or neck swelling.  Normal WBC.  I think he needs to follow-up with a PCP as an outpatient for further work-up if he continues to have this type of pain but at this point there does not appear to be an emergent condition requiring further treatment.  Doubt infection.  Discharged home with return precautions. Final Clinical Impression(s) / ED Diagnoses Final diagnoses:  Neck pain    Rx / DC Orders ED Discharge Orders    None       Sherwood Gambler,  MD 01/16/20 1608

## 2020-04-07 ENCOUNTER — Emergency Department (HOSPITAL_COMMUNITY)
Admission: EM | Admit: 2020-04-07 | Discharge: 2020-04-07 | Disposition: A | Discharge status: Home / Self Care | Payer: Self-pay | Attending: Emergency Medicine | Admitting: Emergency Medicine

## 2020-04-07 ENCOUNTER — Other Ambulatory Visit: Payer: Self-pay

## 2020-04-07 ENCOUNTER — Encounter (HOSPITAL_COMMUNITY): Payer: Self-pay | Admitting: Emergency Medicine

## 2020-04-07 DIAGNOSIS — F17210 Nicotine dependence, cigarettes, uncomplicated: Secondary | ICD-10-CM | POA: Insufficient documentation

## 2020-04-07 DIAGNOSIS — M7918 Myalgia, other site: Secondary | ICD-10-CM | POA: Insufficient documentation

## 2020-04-07 MED ORDER — IBUPROFEN 800 MG PO TABS
800.0000 mg | ORAL_TABLET | Freq: Once | ORAL | Status: AC
  Administered 2020-04-07: 800 mg via ORAL
  Filled 2020-04-07: qty 1

## 2020-04-07 NOTE — Discharge Instructions (Signed)
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
## 2020-04-21 ENCOUNTER — Telehealth: Payer: Self-pay | Admitting: General Practice

## 2020-04-21 NOTE — Telephone Encounter (Signed)
Individual has been contacted 3+ times regarding the ED referral and has been given information regarding the clinic. No further attempts to contact the individual will be made.

## 2020-09-27 ENCOUNTER — Emergency Department: Payer: Medicaid Other

## 2020-09-27 ENCOUNTER — Emergency Department
Admission: EM | Admit: 2020-09-27 | Discharge: 2020-09-27 | Disposition: A | Discharge status: Home / Self Care | Payer: Medicaid Other | Attending: Emergency Medicine | Admitting: Emergency Medicine

## 2020-09-27 ENCOUNTER — Other Ambulatory Visit: Payer: Self-pay

## 2020-09-27 ENCOUNTER — Encounter: Payer: Self-pay | Admitting: Emergency Medicine

## 2020-09-27 DIAGNOSIS — F17200 Nicotine dependence, unspecified, uncomplicated: Secondary | ICD-10-CM | POA: Insufficient documentation

## 2020-09-27 DIAGNOSIS — K148 Other diseases of tongue: Secondary | ICD-10-CM

## 2020-09-27 DIAGNOSIS — D490 Neoplasm of unspecified behavior of digestive system: Secondary | ICD-10-CM | POA: Diagnosis not present

## 2020-09-27 DIAGNOSIS — R1310 Dysphagia, unspecified: Secondary | ICD-10-CM | POA: Insufficient documentation

## 2020-09-27 LAB — COMPREHENSIVE METABOLIC PANEL
ALT: 15 U/L (ref 0–44)
AST: 26 U/L (ref 15–41)
Albumin: 4.4 g/dL (ref 3.5–5.0)
Alkaline Phosphatase: 59 U/L (ref 38–126)
Anion gap: 13 (ref 5–15)
BUN: 14 mg/dL (ref 6–20)
CO2: 23 mmol/L (ref 22–32)
Calcium: 9.1 mg/dL (ref 8.9–10.3)
Chloride: 101 mmol/L (ref 98–111)
Creatinine, Ser: 0.94 mg/dL (ref 0.61–1.24)
GFR, Estimated: >60 mL/min (ref >60)
Glucose, Bld: 119 mg/dL — ABNORMAL HIGH (ref 70–99)
Potassium: 3.8 mmol/L (ref 3.5–5.1)
Sodium: 137 mmol/L (ref 135–145)
Total Bilirubin: 0.7 mg/dL (ref 0.3–1.2)
Total Protein: 7.5 g/dL (ref 6.5–8.1)

## 2020-09-27 LAB — CBC
HCT: 40.1 % (ref 39.0–52.0)
Hemoglobin: 14.5 g/dL (ref 13.0–17.0)
MCH: 34 pg (ref 26.0–34.0)
MCHC: 36.2 g/dL — ABNORMAL HIGH (ref 30.0–36.0)
MCV: 93.9 fL (ref 80.0–100.0)
Platelets: 236 10*3/uL (ref 150–400)
RBC: 4.27 MIL/uL (ref 4.22–5.81)
RDW: 12.2 % (ref 11.5–15.5)
WBC: 9.5 10*3/uL (ref 4.0–10.5)
nRBC: 0 % (ref 0.0–0.2)

## 2020-09-27 MED ORDER — IOHEXOL 300 MG/ML  SOLN
75.0000 mL | Freq: Once | INTRAMUSCULAR | Status: AC | PRN
  Administered 2020-09-27: 75 mL via INTRAVENOUS

## 2020-09-27 NOTE — ED Notes (Signed)
Patient verbalizes understanding of discharge instructions. Opportunity for questioning and answers were provided. Armband removed by staff, pt discharged from ED. Advised of followup need for Onc and ENT referral. Ambulated out to lobby with SO

## 2020-09-27 NOTE — ED Provider Notes (Signed)
[facility] Emergency Department Provider Note   ____________________________________________   Event Date/Time   First MD Initiated Contact with Patient 09/27/20 0209     (approximate)  I have reviewed the triage vital signs and the nursing notes.   HISTORY  Chief Complaint Neck Pain    HPI Paul Hebert is a 49 y.o. male with past medical history of heavy tobacco abuse who presents for 1 year of a sensation of a mass in the back of his throat.  Patient states that he is having difficulty swallowing and has had some weight loss over the past 3 months.  Patient states that he has been seen for this in the past and had a CT scan that showed "nothing there".  Patient states that it is particularly painful to swallow acidic drinks.  Patient denies any difficulty breathing, shortness of breath, dyspnea on exertion         History reviewed. No pertinent past medical history.  There are no problems to display for this patient.   History reviewed. No pertinent surgical history.  Prior to Admission medications   Medication Sig Start Date End Date Taking? Authorizing Provider  doxycycline (MONODOX) 100 MG capsule Take 1 capsule (100 mg total) by mouth 2 (two) times daily. Patient not taking: Reported on 04/07/2020 08/16/19   Sable Feil, PA-C  naproxen (NAPROSYN) 500 MG tablet Take 1 tablet (500 mg total) by mouth 2 (two) times daily with a meal. Patient not taking: Reported on 04/07/2020 08/16/19   Sable Feil, PA-C    Allergies Patient has no known allergies.  No family history on file.  Social History Social History   Tobacco Use   Smoking status: Current Every Day Smoker   Smokeless tobacco: Never Used  Scientific laboratory technician Use: Unknown  Substance Use Topics   Alcohol use: Yes    Review of Systems Constitutional: No fever/chills Eyes: No visual changes. ENT: Endorses sore throat and masslike sensation Cardiovascular: Denies  chest pain. Respiratory: Denies shortness of breath. Gastrointestinal: No abdominal pain.  No nausea, no vomiting.  No diarrhea. Genitourinary: Negative for dysuria. Musculoskeletal: Negative for acute arthralgias Skin: Negative for rash. Neurological: Negative for headaches, weakness/numbness/paresthesias in any extremity Psychiatric: Negative for suicidal ideation/homicidal ideation   ____________________________________________   PHYSICAL EXAM:  VITAL SIGNS: ED Triage Vitals  Enc Vitals Group     BP 09/27/20 0021 133/76     Pulse Rate 09/27/20 0021 100     Resp 09/27/20 0021 18     Temp 09/27/20 0021 98 F (36.7 C)     Temp Source 09/27/20 0021 Oral     SpO2 09/27/20 0021 96 %     Weight 09/27/20 0022 149 lb (67.6 kg)     Height 09/27/20 0022 5\' 10"  (1.778 m)     Head Circumference --      Peak Flow --      Pain Score 09/27/20 0022 8     Pain Loc --      Pain Edu? --      Excl. in [place]? --    Constitutional: Alert and oriented. Well appearing and in no acute distress. Eyes: Conjunctivae are normal. PERRL. Head: Atraumatic. Nose: No congestion/rhinnorhea. Mouth/Throat: Mucous membranes are moist.  Mass to the left aspect of the proximal tongue Neck: No stridor Cardiovascular: Grossly normal heart sounds.  Good peripheral circulation. Respiratory: Normal respiratory effort.  No retractions. Gastrointestinal: Soft and nontender. No distention. Musculoskeletal: No  obvious deformities Neurologic:  Normal speech and language. No gross focal neurologic deficits are appreciated. Skin:  Skin is warm and dry. No rash noted. Psychiatric: Mood and affect are normal. Speech and behavior are normal.  ____________________________________________   LABS (all labs ordered are listed, but only abnormal results are displayed)  Labs Reviewed  CBC - Abnormal; Notable for the following components:      Result Value   MCHC 36.2 (*)    All other components within normal limits   COMPREHENSIVE METABOLIC PANEL - Abnormal; Notable for the following components:   Glucose, Bld 119 (*)    All other components within normal limits   RADIOLOGY  ED MD interpretation: CT soft tissue neck with IV contrast shows a mass to the left tongue base concerning for primary malignancy as well as reactive lymphadenopathy  Official radiology report(s): CT Soft Tissue Neck W Contrast  Result Date: 09/27/2020 CLINICAL DATA:  Initial evaluation for left neck mass EXAM: CT NECK WITH CONTRAST TECHNIQUE: Multidetector CT imaging of the neck was performed using the standard protocol following the bolus administration of intravenous contrast. CONTRAST:  66mL OMNIPAQUE IOHEXOL 300 MG/ML  SOLN COMPARISON:  Prior CT from 01/16/2020 FINDINGS: Pharynx and larynx: Somewhat poorly defined enhancing mass centered at the left base of tongue measures approximately 3.4 x 2.6 x 3.3 cm (series 2, image 49). Lesion invades the tongue along its anterior and medial margin. Inferior and medial aspect of the lesion partially extends into the left vallecula (series 2, image 53). Probable superior and posterior extension to involve the left palatine tonsil (series 2, image 38). Superior and medial aspect of the lesion abuts the uvula with possible early extension into the adjacent soft palate (series 7, image 45). Asymmetric fullness at the left nasopharynx without definite neoplastic involvement. Lesion closely approximates but does not definitely cross the midline. Finding is highly concerning for a primary head neck malignancy/squamous cell carcinoma. Right oropharynx within normal limits. No retropharyngeal collection or swelling. Epiglottis itself appears to be within normal limits. Remainder of the hypopharynx and supraglottic larynx within normal limits. Glottis normal. Subglottic airway clear. Salivary glands: Salivary glands including the parotid and submandibular glands are within normal limits. Thyroid: Normal.  Lymph nodes: Multiple necrotic left level II lymph nodes are seen, largest of which measures 1.4 cm (series 2, images 47, 48). Additional necrotic left level II/III nodal conglomerate measures up to 1.3 cm (series 2, images 61, 64). Few additional subcentimeter left level III nodes are slightly increased in prominence from prior exam, and may be involved as well (series 2, image 55). No right-sided adenopathy. Vascular: Normal intravascular enhancement seen throughout the neck. Limited intracranial: Unremarkable. Visualized orbits: Unremarkable. Mastoids and visualized paranasal sinuses: Moderate mucosal thickening noted throughout the ethmoidal air cells, likely allergic/inflammatory nature. More mild mucosal thickening seen elsewhere within the visualized paranasal sinuses. No air-fluid levels to suggest acute sinusitis. Mastoid air cells and middle ear cavities are largely clear and free of fluid. Skeleton: No acute osseous abnormality. No discrete lytic or blastic osseous lesions. Upper chest: Visualized upper chest demonstrates no acute finding. Partially visualized lungs are clear. Paraseptal emphysematous changes noted at the lung apices. Other: None. IMPRESSION: 1. 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 ENT referral and consultation recommended. 2. Enlarged and necrotic left level II/III lymph nodes as above, consistent with nodal metastases. 3. No other  acute abnormality within the neck. 4.  Emphysema (ICD10-J43.9). Electronically Signed   By: Jeannine Boga M.D.   On: 09/27/2020 04:18    ____________________________________________   PROCEDURES  Procedure(s) performed (including Critical Care):  Procedures   ____________________________________________   INITIAL IMPRESSION / ASSESSMENT AND PLAN / ED COURSE  As part of my medical decision making, I  reviewed the following data within the [place] notes reviewed and incorporated, Labs reviewed, Old chart reviewed, Radiograph reviewed and Notes from prior ED visits reviewed and incorporated        Patient is a 49 year old male who presents for a throat mass for the last year.  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  Patient agrees with plan for discharge home and follow-up with ENT and oncology for further work-up of this mass.  Patient p.o. tolerant and pain under control.      ____________________________________________   FINAL CLINICAL IMPRESSION(S) / ED DIAGNOSES  Final diagnoses:  Mass of tongue  Dysphagia, unspecified type     ED Discharge Orders    None       Note:  This document was prepared using Dragon voice recognition software and may include unintentional dictation errors.   Naaman Plummer, MD 09/27/20 [phone]

## 2020-09-27 NOTE — ED Notes (Signed)
Patient transported to CT 

## 2020-09-27 NOTE — ED Triage Notes (Signed)
Patient states that he has a growth in the back of his throat time one year. Patient says he has been seen twice for it in the past. Patient states that now it is larger. Patient states that he is starting to have difficulty swallowing and that he has had some weight loss.

## 2020-09-27 NOTE — ED Triage Notes (Addendum)
FIRST NURSE NOTE:  Pt reports there is a "growth" in the back of his throat for the past year. Pt states he has been seen for it in the past. Painful to swallow acidic drinks.  No distress noted at this time, respirations equal and unlabored.   Pt states he came to the ED today because SO was coming.

## 2020-09-28 ENCOUNTER — Inpatient Hospital Stay: Payer: Medicaid Other

## 2020-09-28 ENCOUNTER — Other Ambulatory Visit: Payer: Self-pay | Admitting: *Deleted

## 2020-09-28 ENCOUNTER — Telehealth: Payer: Self-pay | Admitting: *Deleted

## 2020-09-28 ENCOUNTER — Encounter: Payer: Self-pay | Admitting: Oncology

## 2020-09-28 ENCOUNTER — Inpatient Hospital Stay: Payer: Medicaid Other | Attending: Oncology | Admitting: Oncology

## 2020-09-28 VITALS — BP 126/89 | HR 89 | Temp 97.8°F | Resp 16 | Wt 149.0 lb

## 2020-09-28 DIAGNOSIS — G893 Neoplasm related pain (acute) (chronic): Secondary | ICD-10-CM | POA: Diagnosis not present

## 2020-09-28 DIAGNOSIS — K148 Other diseases of tongue: Secondary | ICD-10-CM

## 2020-09-28 DIAGNOSIS — R599 Enlarged lymph nodes, unspecified: Secondary | ICD-10-CM | POA: Diagnosis not present

## 2020-09-28 MED ORDER — OXYCODONE HCL 5 MG PO TABS: 5.0000 mg | ORAL_TABLET | Freq: Three times a day (TID) | ORAL | 0 refills | Status: DC | PRN

## 2020-09-28 NOTE — Telephone Encounter (Signed)
Called pt and let him know that we did get his call about the [facility] does not have any oxycodone and wants a rx sent to [facility] on [address]. Originally I was told that he uses [facility] and pt says no he did not say that. I apologized for the error and I called [facility] and cancelled the prescription. Then I called [facility] and was on hold for 35 minutes and I finally left message on there provider line to cancel the rx due to they do not have any in stock. I have sent a rx of the drug to Dr. Janese Banks that will go to [facility] and pt has been told that dr Janese Banks will send the rx to the other pharmacy and he can check with them when it is ready.

## 2020-10-01 ENCOUNTER — Other Ambulatory Visit: Payer: Self-pay | Admitting: Unknown Physician Specialty

## 2020-10-01 DIAGNOSIS — R221 Localized swelling, mass and lump, neck: Secondary | ICD-10-CM

## 2020-10-04 ENCOUNTER — Encounter: Payer: Self-pay | Admitting: Oncology

## 2020-10-04 NOTE — Progress Notes (Signed)
Hematology/Oncology Consult note [facility] Telephone:([phone] [phone]  Patient Care Team: Patient, No Pcp Per as PCP - General (General Practice)   Name of the patient: Paul Hebert  [id number]  1971-05-04    Reason for referral-base of tongue lesion concerning for malignancy   Referring physician-Dr. Cheri Hebert  Date of visit: 10/04/20   History of presenting illness-patient is a 49 year old male who has been having ongoing throat pain for over 6 months. Patient states that he presented with these complaints 6 months ago and had a CT soft tissue neck which did not show any malignancy.  He continued to have on and off pain which was gradually getting worse and he presented back to the ER on 09/27/2020.    CT soft tissue neck showed a left base of tongue mass measuring 3.4 x 2.6 x 3.3 cm which was invading the tongue along the anterior and medial margin and extending into the left vallecula.  Possible superior and posterior extension to involve the left palatine tonsil the lesion abuts the uvula with early extension into the soft palate.  Multiple left level 2 lymph nodes largest of which was 1.2 cm.  Additional necrotic limited to level 3 nodal conglomerate measures 1.3 cm.  No right-sided adenopathy.  Patient will also be meeting Dr. Tami Hebert soon for ENT exam  Currently patient states that his pain is not well controlled And he has been trying to take naproxen on a daily basis to help him with his pain.  Pain mainly bothers him at night.  Reports some difficulty swallowing but is able to eat normally.  Denies any difficulty breathing  ECOG PS- 0  Pain scale- 7   Review of systems- Review of Systems  Constitutional: Negative for chills, fever, malaise/fatigue and weight loss.  HENT: Negative for congestion, ear discharge and nosebleeds.        Left-sided neck pain  Eyes: Negative for blurred vision.  Respiratory: Negative for cough,  hemoptysis, sputum production, shortness of breath and wheezing.   Cardiovascular: Negative for chest pain, palpitations, orthopnea and claudication.  Gastrointestinal: Negative for abdominal pain, blood in stool, constipation, diarrhea, heartburn, melena, nausea and vomiting.  Genitourinary: Negative for dysuria, flank pain, frequency, hematuria and urgency.  Musculoskeletal: Negative for back pain, joint pain and myalgias.  Skin: Negative for rash.  Neurological: Negative for dizziness, tingling, focal weakness, seizures, weakness and headaches.  Endo/Heme/Allergies: Does not bruise/bleed easily.  Psychiatric/Behavioral: Negative for depression and suicidal ideas. The patient does not have insomnia.     No Known Allergies  There are no problems to display for this patient.    History reviewed. No pertinent past medical history.   History reviewed. No pertinent surgical history.  Social History   Socioeconomic History  . Marital status: Single    Spouse name: Not on file  . Number of children: Not on file  . Years of education: Not on file  . Highest education level: Not on file  Occupational History  . Not on file  Tobacco Use  . Smoking status: Current Every Day Smoker  . Smokeless tobacco: Never Used  Vaping Use  . Vaping Use: Unknown  Substance and Sexual Activity  . Alcohol use: Yes  . Drug use: Not on file  . Sexual activity: Not on file  Other Topics Concern  . Not on file  Social History Narrative  . Not on file   Social Determinants of Health   Financial Resource Strain: Not on  file  Food Insecurity: Not on file  Transportation Needs: Not on file  Physical Activity: Not on file  Stress: Not on file  Social Connections: Not on file  Intimate Partner Violence: Not on file     History reviewed. No pertinent family history.   Current Outpatient Medications:  .  doxycycline (MONODOX) 100 MG capsule, Take 1 capsule (100 mg total) by mouth 2 (two) times  daily. (Patient not taking: Reported on 04/07/2020), Disp: 20 capsule, Rfl: 0 .  naproxen (NAPROSYN) 500 MG tablet, Take 1 tablet (500 mg total) by mouth 2 (two) times daily with a meal. (Patient not taking: Reported on 04/07/2020), Disp: 20 tablet, Rfl: 00 .  oxyCODONE (OXY IR/ROXICODONE) 5 MG immediate release tablet, Take 1 tablet (5 mg total) by mouth every 8 (eight) hours as needed for severe pain., Disp: 42 tablet, Rfl: 0   Physical exam:  Vitals:   09/28/20 1409  BP: 126/89  Pulse: 89  Resp: 16  Temp: 97.8 F (36.6 C)  TempSrc: Tympanic  SpO2: 98%  Weight: 149 lb (67.6 kg)   Physical Exam HENT:     Head: Normocephalic and atraumatic.     Mouth/Throat:     Mouth: Mucous membranes are moist.     Pharynx: Oropharynx is clear.     Comments: There is a mass seen in the left base of tongue/oropharynx but difficult to quantify the size of the mass based on exam Eyes:     Extraocular Movements: EOM normal.  Cardiovascular:     Rate and Rhythm: Normal rate and regular rhythm.     Heart sounds: Normal heart sounds.  Pulmonary:     Effort: Pulmonary effort is normal.     Breath sounds: Normal breath sounds.  Abdominal:     General: Bowel sounds are normal.     Palpations: Abdomen is soft.  Lymphadenopathy:     Comments: Palpable left cervical adenopathy which are firm and appear multiple.  Skin:    General: Skin is warm and dry.  Neurological:     Mental Status: He is alert and oriented to person, place, and time.        CMP Latest Ref Rng & Units 09/27/2020  Glucose 70 - 99 mg/dL 119(H)  BUN 6 - 20 mg/dL 14  Creatinine 0.61 - 1.24 mg/dL 0.94  Sodium 135 - 145 mmol/L 137  Potassium 3.5 - 5.1 mmol/L 3.8  Chloride 98 - 111 mmol/L 101  CO2 22 - 32 mmol/L 23  Calcium 8.9 - 10.3 mg/dL 9.1  Total Protein 6.5 - 8.1 g/dL 7.5  Total Bilirubin 0.3 - 1.2 mg/dL 0.7  Alkaline Phos 38 - 126 U/L 59  AST 15 - 41 U/L 26  ALT 0 - 44 U/L 15   CBC Latest Ref Rng & Units 09/27/2020   WBC 4.0 - 10.5 K/uL 9.5  Hemoglobin 13.0 - 17.0 g/dL 14.5  Hematocrit 39.0 - 52.0 % 40.1  Platelets 150 - 400 K/uL 236    No images are attached to the encounter.  CT Soft Tissue Neck W Contrast  Result Date: 09/27/2020 CLINICAL DATA:  Initial evaluation for left neck mass EXAM: CT NECK WITH CONTRAST TECHNIQUE: Multidetector CT imaging of the neck was performed using the standard protocol following the bolus administration of intravenous contrast. CONTRAST:  30mL OMNIPAQUE IOHEXOL 300 MG/ML  SOLN COMPARISON:  Prior CT from 01/16/2020 FINDINGS: Pharynx and larynx: Somewhat poorly defined enhancing mass centered at the left base of tongue measures approximately  3.4 x 2.6 x 3.3 cm (series 2, image 49). Lesion invades the tongue along its anterior and medial margin. Inferior and medial aspect of the lesion partially extends into the left vallecula (series 2, image 53). Probable superior and posterior extension to involve the left palatine tonsil (series 2, image 38). Superior and medial aspect of the lesion abuts the uvula with possible early extension into the adjacent soft palate (series 7, image 45). Asymmetric fullness at the left nasopharynx without definite neoplastic involvement. Lesion closely approximates but does not definitely cross the midline. Finding is highly concerning for a primary head neck malignancy/squamous cell carcinoma. Right oropharynx within normal limits. No retropharyngeal collection or swelling. Epiglottis itself appears to be within normal limits. Remainder of the hypopharynx and supraglottic larynx within normal limits. Glottis normal. Subglottic airway clear. Salivary glands: Salivary glands including the parotid and submandibular glands are within normal limits. Thyroid: Normal. Lymph nodes: Multiple necrotic left level II lymph nodes are seen, largest of which measures 1.4 cm (series 2, images 47, 48). Additional necrotic left level II/III nodal conglomerate measures up to  1.3 cm (series 2, images 61, 64). Few additional subcentimeter left level III nodes are slightly increased in prominence from prior exam, and may be involved as well (series 2, image 55). No right-sided adenopathy. Vascular: Normal intravascular enhancement seen throughout the neck. Limited intracranial: Unremarkable. Visualized orbits: Unremarkable. Mastoids and visualized paranasal sinuses: Moderate mucosal thickening noted throughout the ethmoidal air cells, likely allergic/inflammatory nature. More mild mucosal thickening seen elsewhere within the visualized paranasal sinuses. No air-fluid levels to suggest acute sinusitis. Mastoid air cells and middle ear cavities are largely clear and free of fluid. Skeleton: No acute osseous abnormality. No discrete lytic or blastic osseous lesions. Upper chest: Visualized upper chest demonstrates no acute finding. Partially visualized lungs are clear. Paraseptal emphysematous changes noted at the lung apices. Other: None. IMPRESSION: 1. 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 ENT referral and consultation recommended. 2. Enlarged and necrotic left level II/III lymph nodes as above, consistent with nodal metastases. 3. No other acute abnormality within the neck. 4.  Emphysema (ICD10-J43.9). Electronically Signed   By: Jeannine Boga M.D.   On: 09/27/2020 04:18    Assessment and plan- Patient is a 49 y.o. 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 Tami Hebert to get an ENT exam and will likely get a biopsy soon.  I will proceed with a PET CT scan at this time.  Patient does not have any health insurance and we will work on getting him financial aid if possible.  We are likely looking at concurrent chemoradiation if this turns out to be  squamous cell carcinoma of the oropharynx.  I will see him tentatively in 2 weeks time and he will see Dr. Donella Stade from radiation oncology on the same day as well.  Neoplasm related pain: I have asked him to hold off on using NSAIDs regularly and I will send him a prescription for oxycodone 5 mg every 8 hours as needed for pain  Thank you for this kind referral and the opportunity to participate in the care of this patient   Visit Diagnosis 1. Tongue mass     Dr. Randa Evens, MD, MPH [facility] [phone] 10/04/2020  8:49  AM

## 2020-10-05 NOTE — Progress Notes (Signed)
Patient on schedule for Cervical LN biopsy,10/07/20. Called and spoke with patient on phone with pre procedure instructions given. Made aware to be here @ 0930, NPO after MN prior to procedure.,and driver for discharge post procedure/recovery. Stated understanding.

## 2020-10-06 ENCOUNTER — Other Ambulatory Visit: Payer: Self-pay | Admitting: Radiology

## 2020-10-07 ENCOUNTER — Other Ambulatory Visit: Payer: Self-pay

## 2020-10-07 ENCOUNTER — Ambulatory Visit
Admission: RE | Admit: 2020-10-07 | Discharge: 2020-10-07 | Disposition: A | Discharge status: Home / Self Care | Payer: Medicaid Other | Source: Ambulatory Visit | Attending: Unknown Physician Specialty | Admitting: Unknown Physician Specialty

## 2020-10-07 DIAGNOSIS — C770 Secondary and unspecified malignant neoplasm of lymph nodes of head, face and neck: Secondary | ICD-10-CM | POA: Diagnosis not present

## 2020-10-07 DIAGNOSIS — C801 Malignant (primary) neoplasm, unspecified: Secondary | ICD-10-CM | POA: Insufficient documentation

## 2020-10-07 DIAGNOSIS — R221 Localized swelling, mass and lump, neck: Secondary | ICD-10-CM | POA: Diagnosis present

## 2020-10-07 NOTE — Progress Notes (Signed)
Patient awake/alert x4. After discussion with radiologist , patient will not have sedation. Verbalizes understanding of procedure and follow up care.

## 2020-10-07 NOTE — Discharge Instructions (Signed)

## 2020-10-12 ENCOUNTER — Other Ambulatory Visit: Payer: Self-pay | Admitting: Anatomic Pathology & Clinical Pathology

## 2020-10-12 LAB — SURGICAL PATHOLOGY

## 2020-10-13 ENCOUNTER — Other Ambulatory Visit: Payer: Self-pay | Admitting: *Deleted

## 2020-10-13 ENCOUNTER — Telehealth: Payer: Self-pay | Admitting: *Deleted

## 2020-10-13 MED ORDER — OXYCODONE HCL 5 MG PO TABS: 5.0000 mg | ORAL_TABLET | Freq: Three times a day (TID) | ORAL | 0 refills | Status: DC | PRN

## 2020-10-13 NOTE — Telephone Encounter (Signed)
Steward Drone in triage got a call that pt wants to talk to Smith Robert about him. Steward Drone let him know that Smith Robert is not at work today. I told Steward Drone I would call him. When I called I got his girlfriend and she said that the pt. Has a knot that is hard under his left arm and has 3 knots under right arm .. they are hard and non moving. Pt. Was going to come to the clinic after his pet scan tomorrow. I called Dr. Smith Robert and she says that the pt. Is having scan tomorrow and that scan will show [place] if there is anything there and how it looks and if need be after the scan has been read then when dr Smith Robert look at it and he may need a bx. Of one of these nodules. He has an appt 1/3 and he should keep the appt. Already made.  When I called the pt's number the phone call went to voicemail so I left the message about the answer from Dr. Smith Robert. I did leave direct phone number if she or the pt. Has questions

## 2020-10-14 ENCOUNTER — Ambulatory Visit
Admission: RE | Admit: 2020-10-14 | Discharge: 2020-10-14 | Disposition: A | Discharge status: Home / Self Care | Payer: Medicaid Other | Source: Ambulatory Visit | Attending: Oncology | Admitting: Oncology

## 2020-10-14 ENCOUNTER — Other Ambulatory Visit: Payer: Self-pay

## 2020-10-14 DIAGNOSIS — I700 Atherosclerosis of aorta: Secondary | ICD-10-CM | POA: Diagnosis not present

## 2020-10-14 DIAGNOSIS — K148 Other diseases of tongue: Secondary | ICD-10-CM | POA: Insufficient documentation

## 2020-10-14 DIAGNOSIS — J439 Emphysema, unspecified: Secondary | ICD-10-CM | POA: Diagnosis not present

## 2020-10-14 LAB — GLUCOSE, CAPILLARY: Glucose-Capillary: 90 mg/dL (ref 70–99)

## 2020-10-14 MED ORDER — FLUDEOXYGLUCOSE F - 18 (FDG) INJECTION
7.8650 | Freq: Once | INTRAVENOUS | Status: AC | PRN
  Administered 2020-10-14: 12:00:00 7.865 via INTRAVENOUS

## 2020-10-18 ENCOUNTER — Encounter: Payer: Self-pay | Admitting: Oncology

## 2020-10-18 ENCOUNTER — Inpatient Hospital Stay (HOSPITAL_BASED_OUTPATIENT_CLINIC_OR_DEPARTMENT_OTHER): Payer: Medicaid Other | Admitting: Oncology

## 2020-10-18 ENCOUNTER — Other Ambulatory Visit: Payer: Self-pay | Admitting: *Deleted

## 2020-10-18 ENCOUNTER — Ambulatory Visit: Payer: Self-pay

## 2020-10-18 ENCOUNTER — Encounter: Payer: Self-pay | Admitting: Radiation Oncology

## 2020-10-18 ENCOUNTER — Ambulatory Visit
Admission: RE | Admit: 2020-10-18 | Discharge: 2020-10-18 | Disposition: A | Discharge status: Home / Self Care | Payer: Self-pay | Source: Ambulatory Visit | Attending: Radiation Oncology | Admitting: Radiation Oncology

## 2020-10-18 VITALS — BP 129/93 | HR 78 | Temp 98.8°F | Resp 16 | Wt 147.0 lb

## 2020-10-18 VITALS — BP 129/93 | HR 78 | Temp 97.0°F | Wt 147.0 lb

## 2020-10-18 DIAGNOSIS — F17210 Nicotine dependence, cigarettes, uncomplicated: Secondary | ICD-10-CM | POA: Insufficient documentation

## 2020-10-18 DIAGNOSIS — Z510 Encounter for antineoplastic radiation therapy: Secondary | ICD-10-CM | POA: Diagnosis present

## 2020-10-18 DIAGNOSIS — C109 Malignant neoplasm of oropharynx, unspecified: Secondary | ICD-10-CM | POA: Insufficient documentation

## 2020-10-18 DIAGNOSIS — A630 Anogenital (venereal) warts: Secondary | ICD-10-CM | POA: Insufficient documentation

## 2020-10-18 DIAGNOSIS — E46 Unspecified protein-calorie malnutrition: Secondary | ICD-10-CM | POA: Insufficient documentation

## 2020-10-18 DIAGNOSIS — G893 Neoplasm related pain (acute) (chronic): Secondary | ICD-10-CM

## 2020-10-18 DIAGNOSIS — Z7189 Other specified counseling: Secondary | ICD-10-CM

## 2020-10-18 DIAGNOSIS — Z79899 Other long term (current) drug therapy: Secondary | ICD-10-CM | POA: Insufficient documentation

## 2020-10-18 DIAGNOSIS — R221 Localized swelling, mass and lump, neck: Secondary | ICD-10-CM

## 2020-10-18 DIAGNOSIS — Z5111 Encounter for antineoplastic chemotherapy: Secondary | ICD-10-CM | POA: Insufficient documentation

## 2020-10-18 NOTE — Addendum Note (Signed)
Addended by: Soledad Gerlach on: 10/18/2020 04:07 PM   Modules accepted: Orders

## 2020-10-18 NOTE — Progress Notes (Signed)
   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 Patient states that he presented with these complaints 6 months ago and had a CT soft tissue neck which did not show any malignancy.  He continued to have on and off pain which was gradually getting worse and he presented back to the ER on 09/27/2020.    CT soft tissue neck showed a left base of tongue mass measuring 3.4 x 2.6 x 3.3 cm which was invading the tongue along the anterior and medial margin and extending into the left vallecula.  Possible superior and posterior extension to involve the left palatine tonsil the lesion abuts the uvula with early extension into the soft palate.  Multiple left level 2 lymph nodes largest of which was 1.2 cm.  Additional necrotic limited to level 3 nodal conglomerate measures 1.3 cm.  No right-sided adenopathy.  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  PET CT scan Showed left base of tongue mass measuring about 2.8 cm with enlarged centrally necrotic level 2/3 left-sided and lymph nodes with an SUV about 3.7.  No hypermetabolic right neck nodes.  No findings of distant metastatic disease  Interval history-patient continues to have pain during swallowing but is able to drink liquids without difficulty.  He remains active after his ADLs and IADLs  ECOG PS- 0 Pain scale- 4 Opioid associated constipation- no  Review of systems- Review of Systems  Constitutional: Negative for chills,  fever, malaise/fatigue and weight loss.  HENT: Negative for congestion, ear discharge and nosebleeds.        Left neck pain and pain during swallowing  Eyes: Negative for blurred vision.  Respiratory: Negative for cough, hemoptysis, sputum production, shortness of breath and wheezing.   Cardiovascular: Negative for chest pain, palpitations, orthopnea and claudication.  Gastrointestinal: Negative for abdominal pain, blood in stool, constipation, diarrhea, heartburn, melena, nausea and vomiting.  Genitourinary: Negative for dysuria, flank pain, frequency, hematuria and urgency.  Musculoskeletal: Positive for neck pain. Negative for back pain, joint pain and myalgias.  Skin: Negative for rash.  Neurological: Negative for dizziness, tingling, focal weakness, seizures, weakness and headaches.  Endo/Heme/Allergies: Does not bruise/bleed easily.  Psychiatric/Behavioral: Negative for depression and suicidal ideas. The patient does not have insomnia.       No Known Allergies   Past Medical History:  Diagnosis Date  . Tonsil cancer (HCC)      History reviewed. No pertinent surgical history.  Social History   Socioeconomic History  . Marital status: Single    Spouse name: Not on file  . Number of children: Not on file  . Years of education: Not on file  . Highest education level: Not on file  Occupational History  . Not on file  Tobacco Use  . Smoking status: Current Every Day Smoker  . Smokeless tobacco: Never Used  Vaping Use  . Vaping Use: Unknown  Substance and Sexual Activity  . Alcohol use: Yes  . Drug use: Not on file  . Sexual activity: Not on   file  Other Topics Concern  . Not on file  Social History Narrative  . Not on file   Social Determinants of Health   Financial Resource Strain: Not on file  Food Insecurity: Not on file  Transportation Needs: Not on file  Physical Activity: Not on file  Stress: Not on file  Social Connections: Not on file  Intimate  Partner Violence: Not on file    Family History  Problem Relation Age of Onset  . Cancer Father      Current Outpatient Medications:  .  oxyCODONE (OXY IR/ROXICODONE) 5 MG immediate release tablet, Take 1 tablet (5 mg total) by mouth every 8 (eight) hours as needed for severe pain., Disp: 42 tablet, Rfl: 0  Physical exam:  Vitals:   10/18/20 1402  BP: (!) 129/93  Pulse: 78  Resp: 16  Temp: 98.8 F (37.1 C)  TempSrc: Tympanic  Weight: 146 lb 15.7 oz (66.7 kg)   Physical Exam Eyes:     Extraocular Movements: EOM normal.  Neck:     Comments: Palpable left cervical adenopathy Cardiovascular:     Rate and Rhythm: Normal rate and regular rhythm.     Heart sounds: Normal heart sounds.  Pulmonary:     Effort: Pulmonary effort is normal.     Breath sounds: Normal breath sounds.  Abdominal:     General: Bowel sounds are normal.     Palpations: Abdomen is soft.  Musculoskeletal:     Cervical back: Normal range of motion.  Skin:    General: Skin is warm and dry.  Neurological:     Mental Status: He is alert and oriented to person, place, and time.      CMP Latest Ref Rng & Units 09/27/2020  Glucose 70 - 99 mg/dL 119(H)  BUN 6 - 20 mg/dL 14  Creatinine 0.61 - 1.24 mg/dL 0.94  Sodium 135 - 145 mmol/L 137  Potassium 3.5 - 5.1 mmol/L 3.8  Chloride 98 - 111 mmol/L 101  CO2 22 - 32 mmol/L 23  Calcium 8.9 - 10.3 mg/dL 9.1  Total Protein 6.5 - 8.1 g/dL 7.5  Total Bilirubin 0.3 - 1.2 mg/dL 0.7  Alkaline Phos 38 - 126 U/L 59  AST 15 - 41 U/L 26  ALT 0 - 44 U/L 15   CBC Latest Ref Rng & Units 09/27/2020  WBC 4.0 - 10.5 K/uL 9.5  Hemoglobin 13.0 - 17.0 g/dL 14.5  Hematocrit 39.0 - 52.0 % 40.1  Platelets 150 - 400 K/uL 236    No images are attached to the encounter.  CT Soft Tissue Neck W Contrast  Result Date: 09/27/2020 CLINICAL DATA:  Initial evaluation for left neck mass EXAM: CT NECK WITH CONTRAST TECHNIQUE: Multidetector CT imaging of the neck was performed using  the standard protocol following the bolus administration of intravenous contrast. CONTRAST:  19mL OMNIPAQUE IOHEXOL 300 MG/ML  SOLN COMPARISON:  Prior CT from 01/16/2020 FINDINGS: Pharynx and larynx: Somewhat poorly defined enhancing mass centered at the left base of tongue measures approximately 3.4 x 2.6 x 3.3 cm (series 2, image 49). Lesion invades the tongue along its anterior and medial margin. Inferior and medial aspect of the lesion partially extends into the left vallecula (series 2, image 53). Probable superior and posterior extension to involve the left palatine tonsil (series 2, image 38). Superior and medial aspect of the lesion abuts the uvula with possible early extension into the adjacent soft palate (series 7, image 45). Asymmetric fullness at the left  nasopharynx without definite neoplastic involvement. Lesion closely approximates but does not definitely cross the midline. Finding is highly concerning for a primary head neck malignancy/squamous cell carcinoma. Right oropharynx within normal limits. No retropharyngeal collection or swelling. Epiglottis itself appears to be within normal limits. Remainder of the hypopharynx and supraglottic larynx within normal limits. Glottis normal. Subglottic airway clear. Salivary glands: Salivary glands including the parotid and submandibular glands are within normal limits. Thyroid: Normal. Lymph nodes: Multiple necrotic left level II lymph nodes are seen, largest of which measures 1.4 cm (series 2, images 47, 48). Additional necrotic left level II/III nodal conglomerate measures up to 1.3 cm (series 2, images 61, 64). Few additional subcentimeter left level III nodes are slightly increased in prominence from prior exam, and may be involved as well (series 2, image 55). No right-sided adenopathy. Vascular: Normal intravascular enhancement seen throughout the neck. Limited intracranial: Unremarkable. Visualized orbits: Unremarkable. Mastoids and visualized  paranasal sinuses: Moderate mucosal thickening noted throughout the ethmoidal air cells, likely allergic/inflammatory nature. More mild mucosal thickening seen elsewhere within the visualized paranasal sinuses. No air-fluid levels to suggest acute sinusitis. Mastoid air cells and middle ear cavities are largely clear and free of fluid. Skeleton: No acute osseous abnormality. No discrete lytic or blastic osseous lesions. Upper chest: Visualized upper chest demonstrates no acute finding. Partially visualized lungs are clear. Paraseptal emphysematous changes noted at the lung apices. Other: None. IMPRESSION: 1. 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 ENT referral and consultation recommended. 2. Enlarged and necrotic left level II/III lymph nodes as above, consistent with nodal metastases. 3. No other acute abnormality within the neck. 4.  Emphysema (ICD10-J43.9). Electronically Signed   By: Jeannine Boga M.D.   On: 09/27/2020 04:18   NM PET Image Initial (PI) Skull Base To Thigh  Result Date: 10/14/2020 CLINICAL DATA:  Initial treatment strategy for left base of tongue mass with necrotic left neck lymph nodes. 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 EXAM: NUCLEAR MEDICINE PET SKULL BASE TO THIGH TECHNIQUE: 7.9 mCi F-18 FDG was injected intravenously. Full-ring PET imaging was performed from the skull base to thigh after the radiotracer. CT data was obtained and used for attenuation correction and anatomic localization. Fasting blood glucose: 121 mg/dl COMPARISON:  09/27/2020 neck CT. FINDINGS: Mediastinal blood pool activity: SUV max 2.0 Liver activity: SUV max NA NECK: Hypermetabolic left base of tongue mass measuring approximately 2.8 cm with max SUV 10.8 (series 3/image 40). Mildly enlarged centrally necrotic left level 2/3 neck lymph nodes with low  level hypermetabolism. Representative 1.2 cm high left level 2 node with max SUV 3.7 (series 3/image 40). Representative 1.2 cm low left level 2/upper level 3 node with max SUV 3.4 (series 3/image 52). No hypermetabolic right neck lymph nodes. No hypermetabolic lateral retropharyngeal nodes. Incidental CT findings: none CHEST: No enlarged or hypermetabolic axillary, mediastinal or hilar lymph nodes. No hypermetabolic pulmonary findings. Incidental CT findings: Mildly atherosclerotic nonaneurysmal thoracic aorta. Mild centrilobular and paraseptal emphysema. Tiny 3 mm dense anterior right lower lobe (series 3/image 130) and 3 mm anterior left lower lobe (series 3/image 106) solid pulmonary nodules, below PET resolution. ABDOMEN/PELVIS: No abnormal hypermetabolic activity within the liver, pancreas, adrenal glands, or spleen. No hypermetabolic lymph nodes in the abdomen or pelvis. Incidental CT findings: Atherosclerotic nonaneurysmal abdominal aorta. SKELETON: No focal hypermetabolic activity to suggest skeletal metastasis. Incidental  CT findings: none IMPRESSION: 1. Hypermetabolic left base of tongue mass compatible with known primary malignancy in this location. 2. Hypermetabolic left level 2/3 neck lymph nodes. 3. No hypermetabolic metastatic disease in the chest, abdomen, pelvis or skeleton. 4. Two tiny 3 mm solid pulmonary nodules, below PET resolution. Recommend follow-up chest CT in 3 months. 5. Aortic Atherosclerosis (ICD10-I70.0) and Emphysema (ICD10-J43.9). Electronically Signed   By: Ilona Sorrel M.D.   On: 10/14/2020 15:54   [place] CORE BIOPSY (LYMPH NODES)  Result Date: 10/07/2020 INDICATION: Base of tongue tumor, left cervical adenopathy. EXAM: ULTRASOUND GUIDED CORE BIOPSY OF LEFT CERVICAL ADENOPATHY MEDICATIONS: Lidocaine 1% subcutaneous ANESTHESIA/SEDATION: None PROCEDURE: The procedure, risks, benefits, and alternatives were explained to the patient. Questions regarding the procedure were encouraged  and answered. The patient understands and consents to the procedure. Survey ultrasound elect Cordella Register was performed. Complex left cervical adenopathy was localized corresponding to recent CT neck, and an appropriate skin entry site was determined and marked. The operative field was prepped with chlorhexidine in a sterile fashion, and a sterile drape was applied covering the operative field. A sterile gown and sterile gloves were used for the procedure. Local anesthesia was provided with 1% Lidocaine. Under real-time ultrasound guidance, multiple 18-gauge core biopsy samples were obtained, submitted in saline on Telfa to surgical pathology. The guide needle was removed. Postprocedure scans show no hemorrhage or other apparent complication. The patient tolerated the procedure well. COMPLICATIONS: None immediate. FINDINGS: Complex left cervical adenopathy was localized corresponding to recent CT findings. Representative core biopsy samples obtained as above. IMPRESSION: 1. Technically successful ultrasound-guided core biopsy, left cervical adenopathy. Electronically Signed   By: Lucrezia Europe M.D.   On: 10/07/2020 14:28     Assessment and plan- Patient is a 49 y.o. 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  Overall patient noted to have a 2.8 cm tumor on PET CT scan with multiple ipsilateral left-sided cervical adenopathy But no contralateral adenopathy.  Biopsy was consistent with HPV positive squamous cell carcinoma which makes this a stage I T2 N1 M0 disease.  Per NCCN guidelines I would recommend concurrent chemoradiation with weekly cisplatin at 40 mg per metered square for 7 weeks.  Discussed risks and benefits of cisplatin including all but not limited to nausea, vomiting, low blood counts, risk of infections and  hospitalization.  Risk of acute kidney injury, hearing loss, peripheral neuropathy associated with cisplatin.  Treatment will be given with a curative intent.  Patient understands and agrees to proceed as planned.  He will need port placement to start chemotherapy as well as chemo teach.  I will also have him see Cyndi Bender from nutrition.  For now patient is able to swallow liquids.  If he is unable to keep up with his oral intake during chemoradiation feeding tube would be recommended.  Neoplasm related pain: Continue as needed oxycodone  I will tentatively see him back in 2 weeks time with CBC with differential, CMP to start weekly cisplatin chemotherapy  Patient was concerned about swollen lymph nodes in his axilla.  He does not have any palpable axillary adenopathy or PET uptake in the axilla.  He has folliculitis especially in his left axilla.  This appears mild and does not require any oral antibiotic treatment at this time.  I have encouraged the patient to take  his Covid vaccine prior to starting chemotherapy  Cancer Staging Squamous cell carcinoma of oropharynx (HCC) Staging form: Pharynx - HPV-Mediated Oropharynx, AJCC 8th Edition - Clinical stage from 10/18/2020: Stage I (cT2, cN1, cM0) - Signed by Creig Hines, MD on 10/18/2020     Visit Diagnosis 1. Squamous cell carcinoma of oropharynx (HCC)   2. Goals of care, counseling/discussion   3. Neoplasm related pain      Dr. Owens Shark, MD, MPH [facility] [phone] 10/18/2020 3:05 PM

## 2020-10-18 NOTE — Progress Notes (Signed)
Pt was checked in through radiation so info should be updated. Tonsil pain and lymph nodes under his arms

## 2020-10-18 NOTE — H&P (View-Only) (Signed)
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 Patient states that he presented with these complaints 6 months ago and had a CT soft tissue neck which did not show any malignancy.  He continued to have on and off pain which was gradually getting worse and he presented back to the ER on 09/27/2020.    CT soft tissue neck showed a left base of tongue mass measuring 3.4 x 2.6 x 3.3 cm which was invading the tongue along the anterior and medial margin and extending into the left vallecula.  Possible superior and posterior extension to involve the left palatine tonsil the lesion abuts the uvula with early extension into the soft palate.  Multiple left level 2 lymph nodes largest of which was 1.2 cm.  Additional necrotic limited to level 3 nodal conglomerate measures 1.3 cm.  No right-sided adenopathy.  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  PET CT scan Showed left base of tongue mass measuring about 2.8 cm with enlarged centrally necrotic level 2/3 left-sided and lymph nodes with an SUV about 3.7.  No hypermetabolic right neck nodes.  No findings of distant metastatic disease  Interval history-patient continues to have pain during swallowing but is able to drink liquids without difficulty.  He remains active after his ADLs and IADLs  ECOG PS- 0 Pain scale- 4 Opioid associated constipation- no  Review of systems- Review of Systems  Constitutional: Negative for chills,  fever, malaise/fatigue and weight loss.  HENT: Negative for congestion, ear discharge and nosebleeds.        Left neck pain and pain during swallowing  Eyes: Negative for blurred vision.  Respiratory: Negative for cough, hemoptysis, sputum production, shortness of breath and wheezing.   Cardiovascular: Negative for chest pain, palpitations, orthopnea and claudication.  Gastrointestinal: Negative for abdominal pain, blood in stool, constipation, diarrhea, heartburn, melena, nausea and vomiting.  Genitourinary: Negative for dysuria, flank pain, frequency, hematuria and urgency.  Musculoskeletal: Positive for neck pain. Negative for back pain, joint pain and myalgias.  Skin: Negative for rash.  Neurological: Negative for dizziness, tingling, focal weakness, seizures, weakness and headaches.  Endo/Heme/Allergies: Does not bruise/bleed easily.  Psychiatric/Behavioral: Negative for depression and suicidal ideas. The patient does not have insomnia.       No Known Allergies   Past Medical History:  Diagnosis Date  . Tonsil cancer ([place])      History reviewed. No pertinent surgical history.  Social History   Socioeconomic History  . Marital status: Single    Spouse name: Not on file  . Number of children: Not on file  . Years of education: Not on file  . Highest education level: Not on file  Occupational History  . Not on file  Tobacco Use  . Smoking status: Current Every Day Smoker  . Smokeless tobacco: Never Used  Vaping Use  . Vaping Use: Unknown  Substance and Sexual Activity  . Alcohol use: Yes  . Drug use: Not on file  . Sexual activity: Not on  file  Other Topics Concern  . Not on file  Social History Narrative  . Not on file   Social Determinants of Health   Financial Resource Strain: Not on file  Food Insecurity: Not on file  Transportation Needs: Not on file  Physical Activity: Not on file  Stress: Not on file  Social Connections: Not on file  Intimate  Partner Violence: Not on file    Family History  Problem Relation Age of Onset  . Cancer Father      Current Outpatient Medications:  .  oxyCODONE (OXY IR/ROXICODONE) 5 MG immediate release tablet, Take 1 tablet (5 mg total) by mouth every 8 (eight) hours as needed for severe pain., Disp: 42 tablet, Rfl: 0  Physical exam:  Vitals:   10/18/20 1402  BP: (!) 129/93  Pulse: 78  Resp: 16  Temp: 98.8 F (37.1 C)  TempSrc: Tympanic  Weight: 146 lb 15.7 oz (66.7 kg)   Physical Exam Eyes:     Extraocular Movements: EOM normal.  Neck:     Comments: Palpable left cervical adenopathy Cardiovascular:     Rate and Rhythm: Normal rate and regular rhythm.     Heart sounds: Normal heart sounds.  Pulmonary:     Effort: Pulmonary effort is normal.     Breath sounds: Normal breath sounds.  Abdominal:     General: Bowel sounds are normal.     Palpations: Abdomen is soft.  Musculoskeletal:     Cervical back: Normal range of motion.  Skin:    General: Skin is warm and dry.  Neurological:     Mental Status: He is alert and oriented to person, place, and time.      CMP Latest Ref Rng & Units 09/27/2020  Glucose 70 - 99 mg/dL 119(H)  BUN 6 - 20 mg/dL 14  Creatinine 0.61 - 1.24 mg/dL 0.94  Sodium 135 - 145 mmol/L 137  Potassium 3.5 - 5.1 mmol/L 3.8  Chloride 98 - 111 mmol/L 101  CO2 22 - 32 mmol/L 23  Calcium 8.9 - 10.3 mg/dL 9.1  Total Protein 6.5 - 8.1 g/dL 7.5  Total Bilirubin 0.3 - 1.2 mg/dL 0.7  Alkaline Phos 38 - 126 U/L 59  AST 15 - 41 U/L 26  ALT 0 - 44 U/L 15   CBC Latest Ref Rng & Units 09/27/2020  WBC 4.0 - 10.5 K/uL 9.5  Hemoglobin 13.0 - 17.0 g/dL 14.5  Hematocrit 39.0 - 52.0 % 40.1  Platelets 150 - 400 K/uL 236    No images are attached to the encounter.  CT Soft Tissue Neck W Contrast  Result Date: 09/27/2020 CLINICAL DATA:  Initial evaluation for left neck mass EXAM: CT NECK WITH CONTRAST TECHNIQUE: Multidetector CT imaging of the neck was performed using  the standard protocol following the bolus administration of intravenous contrast. CONTRAST:  82mL OMNIPAQUE IOHEXOL 300 MG/ML  SOLN COMPARISON:  Prior CT from 01/16/2020 FINDINGS: Pharynx and larynx: Somewhat poorly defined enhancing mass centered at the left base of tongue measures approximately 3.4 x 2.6 x 3.3 cm (series 2, image 49). Lesion invades the tongue along its anterior and medial margin. Inferior and medial aspect of the lesion partially extends into the left vallecula (series 2, image 53). Probable superior and posterior extension to involve the left palatine tonsil (series 2, image 38). Superior and medial aspect of the lesion abuts the uvula with possible early extension into the adjacent soft palate (series 7, image 45). Asymmetric fullness at the left  nasopharynx without definite neoplastic involvement. Lesion closely approximates but does not definitely cross the midline. Finding is highly concerning for a primary head neck malignancy/squamous cell carcinoma. Right oropharynx within normal limits. No retropharyngeal collection or swelling. Epiglottis itself appears to be within normal limits. Remainder of the hypopharynx and supraglottic larynx within normal limits. Glottis normal. Subglottic airway clear. Salivary glands: Salivary glands including the parotid and submandibular glands are within normal limits. Thyroid: Normal. Lymph nodes: Multiple necrotic left level II lymph nodes are seen, largest of which measures 1.4 cm (series 2, images 47, 48). Additional necrotic left level II/III nodal conglomerate measures up to 1.3 cm (series 2, images 61, 64). Few additional subcentimeter left level III nodes are slightly increased in prominence from prior exam, and may be involved as well (series 2, image 55). No right-sided adenopathy. Vascular: Normal intravascular enhancement seen throughout the neck. Limited intracranial: Unremarkable. Visualized orbits: Unremarkable. Mastoids and visualized  paranasal sinuses: Moderate mucosal thickening noted throughout the ethmoidal air cells, likely allergic/inflammatory nature. More mild mucosal thickening seen elsewhere within the visualized paranasal sinuses. No air-fluid levels to suggest acute sinusitis. Mastoid air cells and middle ear cavities are largely clear and free of fluid. Skeleton: No acute osseous abnormality. No discrete lytic or blastic osseous lesions. Upper chest: Visualized upper chest demonstrates no acute finding. Partially visualized lungs are clear. Paraseptal emphysematous changes noted at the lung apices. Other: None. IMPRESSION: 1. 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 ENT referral and consultation recommended. 2. Enlarged and necrotic left level II/III lymph nodes as above, consistent with nodal metastases. 3. No other acute abnormality within the neck. 4.  Emphysema (ICD10-J43.9). Electronically Signed   By: Jeannine Boga M.D.   On: 09/27/2020 04:18   NM PET Image Initial (PI) Skull Base To Thigh  Result Date: 10/14/2020 CLINICAL DATA:  Initial treatment strategy for left base of tongue mass with necrotic left neck lymph nodes. 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 EXAM: NUCLEAR MEDICINE PET SKULL BASE TO THIGH TECHNIQUE: 7.9 mCi F-18 FDG was injected intravenously. Full-ring PET imaging was performed from the skull base to thigh after the radiotracer. CT data was obtained and used for attenuation correction and anatomic localization. Fasting blood glucose: 121 mg/dl COMPARISON:  09/27/2020 neck CT. FINDINGS: Mediastinal blood pool activity: SUV max 2.0 Liver activity: SUV max NA NECK: Hypermetabolic left base of tongue mass measuring approximately 2.8 cm with max SUV 10.8 (series 3/image 40). Mildly enlarged centrally necrotic left level 2/3 neck lymph nodes with low  level hypermetabolism. Representative 1.2 cm high left level 2 node with max SUV 3.7 (series 3/image 40). Representative 1.2 cm low left level 2/upper level 3 node with max SUV 3.4 (series 3/image 52). No hypermetabolic right neck lymph nodes. No hypermetabolic lateral retropharyngeal nodes. Incidental CT findings: none CHEST: No enlarged or hypermetabolic axillary, mediastinal or hilar lymph nodes. No hypermetabolic pulmonary findings. Incidental CT findings: Mildly atherosclerotic nonaneurysmal thoracic aorta. Mild centrilobular and paraseptal emphysema. Tiny 3 mm dense anterior right lower lobe (series 3/image 130) and 3 mm anterior left lower lobe (series 3/image 106) solid pulmonary nodules, below PET resolution. ABDOMEN/PELVIS: No abnormal hypermetabolic activity within the liver, pancreas, adrenal glands, or spleen. No hypermetabolic lymph nodes in the abdomen or pelvis. Incidental CT findings: Atherosclerotic nonaneurysmal abdominal aorta. SKELETON: No focal hypermetabolic activity to suggest skeletal metastasis. Incidental  CT findings: none IMPRESSION: 1. Hypermetabolic left base of tongue mass compatible with known primary malignancy in this location. 2. Hypermetabolic left level 2/3 neck lymph nodes. 3. No hypermetabolic metastatic disease in the chest, abdomen, pelvis or skeleton. 4. Two tiny 3 mm solid pulmonary nodules, below PET resolution. Recommend follow-up chest CT in 3 months. 5. Aortic Atherosclerosis (ICD10-I70.0) and Emphysema (ICD10-J43.9). Electronically Signed   By: Ilona Sorrel M.D.   On: 10/14/2020 15:54   [place] CORE BIOPSY (LYMPH NODES)  Result Date: 10/07/2020 INDICATION: Base of tongue tumor, left cervical adenopathy. EXAM: ULTRASOUND GUIDED CORE BIOPSY OF LEFT CERVICAL ADENOPATHY MEDICATIONS: Lidocaine 1% subcutaneous ANESTHESIA/SEDATION: None PROCEDURE: The procedure, risks, benefits, and alternatives were explained to the patient. Questions regarding the procedure were encouraged  and answered. The patient understands and consents to the procedure. Survey ultrasound elect Cordella Register was performed. Complex left cervical adenopathy was localized corresponding to recent CT neck, and an appropriate skin entry site was determined and marked. The operative field was prepped with chlorhexidine in a sterile fashion, and a sterile drape was applied covering the operative field. A sterile gown and sterile gloves were used for the procedure. Local anesthesia was provided with 1% Lidocaine. Under real-time ultrasound guidance, multiple 18-gauge core biopsy samples were obtained, submitted in saline on Telfa to surgical pathology. The guide needle was removed. Postprocedure scans show no hemorrhage or other apparent complication. The patient tolerated the procedure well. COMPLICATIONS: None immediate. FINDINGS: Complex left cervical adenopathy was localized corresponding to recent CT findings. Representative core biopsy samples obtained as above. IMPRESSION: 1. Technically successful ultrasound-guided core biopsy, left cervical adenopathy. Electronically Signed   By: Lucrezia Europe M.D.   On: 10/07/2020 14:28     Assessment and plan- Patient is a 50 y.o. 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  Overall patient noted to have a 2.8 cm tumor on PET CT scan with multiple ipsilateral left-sided cervical adenopathy But no contralateral adenopathy.  Biopsy was consistent with HPV positive squamous cell carcinoma which makes this a stage I T2 N1 M0 disease.  Per NCCN guidelines I would recommend concurrent chemoradiation with weekly cisplatin at 40 mg per metered square for 7 weeks.  Discussed risks and benefits of cisplatin including all but not limited to nausea, vomiting, low blood counts, risk of infections and  hospitalization.  Risk of acute kidney injury, hearing loss, peripheral neuropathy associated with cisplatin.  Treatment will be given with a curative intent.  Patient understands and agrees to proceed as planned.  He will need port placement to start chemotherapy as well as chemo teach.  I will also have him see Cyndi Bender from nutrition.  For now patient is able to swallow liquids.  If he is unable to keep up with his oral intake during chemoradiation feeding tube would be recommended.  Neoplasm related pain: Continue as needed oxycodone  I will tentatively see him back in 2 weeks time with CBC with differential, CMP to start weekly cisplatin chemotherapy  Patient was concerned about swollen lymph nodes in his axilla.  He does not have any palpable axillary adenopathy or PET uptake in the axilla.  He has folliculitis especially in his left axilla.  This appears mild and does not require any oral antibiotic treatment at this time.  I have encouraged the patient to take  his Covid vaccine prior to starting chemotherapy  Cancer Staging Squamous cell carcinoma of oropharynx (HCC) Staging form: Pharynx - HPV-Mediated Oropharynx, AJCC 8th Edition - Clinical stage from 10/18/2020: Stage I (cT2, cN1, cM0) - Signed by Creig Hines, MD on 10/18/2020     Visit Diagnosis 1. Squamous cell carcinoma of oropharynx (HCC)   2. Goals of care, counseling/discussion   3. Neoplasm related pain      Dr. Owens Shark, MD, MPH [facility] [phone] 10/18/2020 3:05 PM

## 2020-10-18 NOTE — Progress Notes (Addendum)
Nutrition Assessment   Reason for Assessment:  New head and neck cancer patient   ASSESSMENT:  50 year old male with squamous cell carcinoma of oropharynx/left base of tongue, HPV +.  Planning concurrent chemotherapy and radiation therapy.    Met with patient today as add on in clinic.  Patient reports that he has lost 24 lbs in the last several months. Reports burning when he swallows foods on when foods hit the spot on his tongue.  Is avoiding spicy foods, tomato based foods but recently all foods are burning.  Reports was able to eat ribs, green beans last night but burned. Patient is concerned about his weight loss and ability to eat during treatment.     Medications: reviewed   Labs: reviewed   Anthropometrics:   Height: 69 inches Weight: 146 lb 15.7 today UBW: 167-169 lb about 4 months ago BMI: 21  14% weight loss in the last 4 months, signficant   Estimated Energy Needs  Kcals: 1980-2300 Protein: 99-115 g Fluid: > 2 L   NUTRITION DIAGNOSIS: Predicted sub optimal energy intake related to base of tongue cancer and side effects of treatment effecting nutritional intake.    INTERVENTION:  Concerned about patient's nutritional status prior to starting treatment with significant weight loss, burning when eating foods and would recommend upfront PEG tube to provide nutrition.  Spoke with MD regarding recommendations Discussed PEG tube with patient today and sample tube shown. Patient to discuss with sister and decide about tube placement. Discussed ways to add calories and protein to diet. Handouts provided Provided recipes (shakes, smoothies, etc) Provides samples of ensure plus, ensure complete, Dillard [place], boost plus shake). Recommend evaluation by SLP. Contact information provided   MONITORING, EVALUATION, GOAL: weight trends, intake   Next Visit: Thursday, Jan 20th phone visit if not sooner  Shalaunda Weatherholtz B. Zenia Resides, [place], [place] Registered Dietitian [phone]  (mobile)

## 2020-10-18 NOTE — Consult Note (Signed)
NEW PATIENT EVALUATION  Name: Paul Hebert  MRN: [id number]  Date:   10/18/2020     DOB: Feb 20, 1971   This 50 y.o. male patient presents to the clinic for initial evaluation of stage IVa (T2N2B M0) p16 positive squamous cell carcinoma of the left tongue base.  REFERRING PHYSICIAN: Sindy Guadeloupe, MD  CHIEF COMPLAINT:  Chief Complaint  Patient presents with  . Consult    DIAGNOSIS: The encounter diagnosis was Neck mass.   PREVIOUS INVESTIGATIONS:  PET/CT and CT scans reviewed Clinical notes reviewed Pathology report reviewed  HPI: Patient is a 50 year old male whose father was a previous lung cancer patient of ours back in 2018.  He is presented with a year of increasing dysphagia pain in his posterior oropharynx.  He had a CT scan initially which did not show any evidence of malignancy.Marland Kitchen  He was seen in the ER back 1213 when CT scan demonstrated a 3.4 x 2.6 x 3.3 cm base of tongue mass invading the tongue along the anterior medial margin extending into the left vallecula.  There is also extension to involve the left palatine tonsil.  There is also multiple level 2 left-sided lymph nodes largest measuring 1.2 cm.  There was no right sided adenopathy.  He underwent ultrasound-guided biopsy of his neck nodes showing metastatic squamous cell carcinoma p16 positive.  PET CT scan demonstrated hypermetabolic activity in the left tongue base compatible with known malignancy there is also left level 2 3 lymph nodes which are hypermetabolic no other evidence of distant metastatic disease.  He has been seen by medical oncology is now referred to radiation collagen for consideration of treatment.  He continues to have dysphagia is had about a 20 pound weight loss over the past several months.  PLANNED TREATMENT REGIMEN: Concurrent chemoradiation  PAST MEDICAL HISTORY:  has no past medical history on file.    PAST SURGICAL HISTORY: No past surgical history on file.  FAMILY HISTORY: family history  is not on file.  SOCIAL HISTORY:  reports that he has been smoking. He has never used smokeless tobacco. He reports current alcohol use.  Drug: Marijuana.  ALLERGIES: Patient has no known allergies.  MEDICATIONS:  Current Outpatient Medications  Medication Sig Dispense Refill  . oxyCODONE (OXY IR/ROXICODONE) 5 MG immediate release tablet Take 1 tablet (5 mg total) by mouth every 8 (eight) hours as needed for severe pain. 42 tablet 0   No current facility-administered medications for this encounter.    ECOG PERFORMANCE STATUS:  1 - Symptomatic but completely ambulatory  REVIEW OF SYSTEMS: Patient denies any weight loss, fatigue, weakness, fever, chills or night sweats. Patient denies any loss of vision, blurred vision. Patient denies any ringing  of the ears or hearing loss. No irregular heartbeat. Patient denies heart murmur or history of fainting. Patient denies any chest pain or pain radiating to her upper extremities. Patient denies any shortness of breath, difficulty breathing at night, cough or hemoptysis. Patient denies any swelling in the lower legs. Patient denies any nausea vomiting, vomiting of blood, or coffee ground material in the vomitus. Patient denies any stomach pain. Patient states has had normal bowel movements no significant constipation or diarrhea. Patient denies any dysuria, hematuria or significant nocturia. Patient denies any problems walking, swelling in the joints or loss of balance. Patient denies any skin changes, loss of hair or loss of weight. Patient denies any excessive worrying or anxiety or significant depression. Patient denies any problems with insomnia. Patient denies  excessive thirst, polyuria, polydipsia. Patient denies any swollen glands, patient denies easy bruising or easy bleeding. Patient denies any recent infections, allergies or URI. Patient "s visual fields have not changed significantly in recent time.   PHYSICAL EXAM: BP (!) 129/93   Pulse 78    Temp (!) 97 F (36.1 C) (Tympanic)   Wt 147 lb (66.7 kg)   BMI 21.71 kg/m  Patient does have prominent high cervical lymph node involvement.  Well-developed well-nourished patient in NAD. HEENT reveals PERLA, EOMI, discs not visualized.  Oral cavity is clear. No oral mucosal lesions are identified. Neck is clear without evidence of cervical or supraclavicular adenopathy. Lungs are clear to A&P. Cardiac examination is essentially unremarkable with regular rate and rhythm without murmur rub or thrill. Abdomen is benign with no organomegaly or masses noted. Motor sensory and DTR levels are equal and symmetric in the upper and lower extremities. Cranial nerves II through XII are grossly intact. Proprioception is intact. No peripheral adenopathy or edema is identified. No motor or sensory levels are noted. Crude visual fields are within normal range.  LABORATORY DATA: Pathology report reviewed    RADIOLOGY RESULTS: CT scan PET CT scan reviewed compatible with above-stated findings   IMPRESSION: Stage IVa p16Positive squamous cell carcinoma the base of tongue in 50 year old male  PLAN: At this time elect go ahead with concurrent chemoradiation I would use IMRT treatment planning and delivery to deliver 70 Gray to areas of hypermetabolic activity both in the primary base of tongue tumor as well as his left cervical nodes.  Because the lesion approaches the midline would treat the remaining lymph nodes on both sides of his neck up to 5400 cGy using IMRT dose painting technique.  Risks and benefits of treatment occluding loss of taste oral mucositis xerostomia skin reaction alteration of blood counts fatigue all were described in detail to the patient.  I have personally set up and ordered CT simulation for later this week.  We will coordinate chemotherapy with medical oncology.  There will be extra effort by both professional staff as well as technical staff to coordinate and manage concurrent chemoradiation  and ensuing side effects during his treatments. Patient comprehends my recommendations well.  I personally set up and ordered CT simulation and will use PET CT fusion study for treatment planning.  I would like to take this opportunity to thank you for allowing me to participate in the care of your patient.Carmina Miller, MD

## 2020-10-19 ENCOUNTER — Telehealth (INDEPENDENT_AMBULATORY_CARE_PROVIDER_SITE_OTHER): Payer: Self-pay

## 2020-10-19 ENCOUNTER — Telehealth: Payer: Self-pay

## 2020-10-19 ENCOUNTER — Ambulatory Visit
Admission: RE | Admit: 2020-10-19 | Discharge: 2020-10-19 | Disposition: A | Discharge status: Home / Self Care | Payer: Medicaid Other | Source: Ambulatory Visit | Attending: Radiation Oncology | Admitting: Radiation Oncology

## 2020-10-19 DIAGNOSIS — Z510 Encounter for antineoplastic radiation therapy: Secondary | ICD-10-CM | POA: Insufficient documentation

## 2020-10-19 DIAGNOSIS — C109 Malignant neoplasm of oropharynx, unspecified: Secondary | ICD-10-CM | POA: Insufficient documentation

## 2020-10-19 NOTE — Telephone Encounter (Signed)
Spoke with the patient and he is scheduled with Dr. Wyn Quaker for a port placement on 10/25/20 with a 10:45 am arrival time to the [facility]. Covid testing on  10/21/20 between 8-1 pm at the [facility]. Pre-procedure instructions were discussed and will be mailed.

## 2020-10-19 NOTE — Telephone Encounter (Signed)
Nutrition  Patient called RD this am to say that he is interested in going ahead and having feeding tube placed.  I shared with patient that I would inform Dr Smith Robert and her team of his decision and they would be able to get this set up.  Patient verbalized understanding.   RD sent secure chart message to Dr Smith Robert and Roanna Raider regarding patients decision.    Tawsha Terrero B. Freida Busman, RD, LDN Registered Dietitian [phone] (mobile)

## 2020-10-19 NOTE — Telephone Encounter (Signed)
Nutrition  Patient just called RD back to say that he does not want feeding tube placement at this time.  He apologized for being undecided about tube placement.  Dr Smith Robert and team notified via secure chat.  Tayvien Kane B. Freida Busman, RD, LDN Registered Dietitian [phone] (mobile)

## 2020-10-20 ENCOUNTER — Ambulatory Visit: Payer: Self-pay | Admitting: Surgery

## 2020-10-21 ENCOUNTER — Other Ambulatory Visit: Payer: Self-pay

## 2020-10-21 ENCOUNTER — Other Ambulatory Visit
Admission: RE | Admit: 2020-10-21 | Discharge: 2020-10-21 | Disposition: A | Discharge status: Home / Self Care | Payer: Medicaid Other | Source: Ambulatory Visit | Attending: Vascular Surgery | Admitting: Vascular Surgery

## 2020-10-21 DIAGNOSIS — Z01812 Encounter for preprocedural laboratory examination: Secondary | ICD-10-CM | POA: Insufficient documentation

## 2020-10-21 DIAGNOSIS — Z20822 Contact with and (suspected) exposure to covid-19: Secondary | ICD-10-CM | POA: Diagnosis not present

## 2020-10-21 LAB — SARS CORONAVIRUS 2 (TAT 6-24 HRS): SARS Coronavirus 2: NEGATIVE

## 2020-10-25 ENCOUNTER — Encounter: Payer: Self-pay | Admitting: Vascular Surgery

## 2020-10-25 ENCOUNTER — Other Ambulatory Visit: Payer: Self-pay | Admitting: Oncology

## 2020-10-25 ENCOUNTER — Telehealth: Payer: Self-pay | Admitting: *Deleted

## 2020-10-25 ENCOUNTER — Other Ambulatory Visit (INDEPENDENT_AMBULATORY_CARE_PROVIDER_SITE_OTHER): Payer: Self-pay | Admitting: Nurse Practitioner

## 2020-10-25 ENCOUNTER — Encounter
Admission: RE | Disposition: A | Discharge status: Home / Self Care | Payer: Self-pay | Source: Home / Self Care | Attending: Vascular Surgery

## 2020-10-25 ENCOUNTER — Ambulatory Visit
Admission: RE | Admit: 2020-10-25 | Discharge: 2020-10-25 | Disposition: A | Discharge status: Home / Self Care | Payer: Medicaid Other | Attending: Vascular Surgery | Admitting: Vascular Surgery

## 2020-10-25 DIAGNOSIS — G893 Neoplasm related pain (acute) (chronic): Secondary | ICD-10-CM | POA: Insufficient documentation

## 2020-10-25 DIAGNOSIS — C109 Malignant neoplasm of oropharynx, unspecified: Secondary | ICD-10-CM | POA: Insufficient documentation

## 2020-10-25 DIAGNOSIS — F17200 Nicotine dependence, unspecified, uncomplicated: Secondary | ICD-10-CM | POA: Diagnosis not present

## 2020-10-25 HISTORY — PX: PORTA CATH INSERTION: CATH118285

## 2020-10-25 SURGERY — PORTA CATH INSERTION
Anesthesia: Moderate Sedation

## 2020-10-25 MED ORDER — FAMOTIDINE 20 MG PO TABS: 40.0000 mg | ORAL_TABLET | Freq: Once | ORAL | Status: DC | PRN

## 2020-10-25 MED ORDER — OXYCODONE HCL 10 MG PO TABS: 10.0000 mg | ORAL_TABLET | ORAL | 0 refills | Status: DC | PRN

## 2020-10-25 MED ORDER — FENTANYL CITRATE (PF) 100 MCG/2ML IJ SOLN
INTRAMUSCULAR | Status: DC | PRN
  Administered 2020-10-25: 50 ug via INTRAVENOUS

## 2020-10-25 MED ORDER — MIDAZOLAM HCL 2 MG/ML PO SYRP: 8.0000 mg | ORAL_SOLUTION | Freq: Once | ORAL | Status: DC | PRN

## 2020-10-25 MED ORDER — MIDAZOLAM HCL 2 MG/2ML IJ SOLN
INTRAMUSCULAR | Status: DC | PRN
  Administered 2020-10-25: 2 mg via INTRAVENOUS

## 2020-10-25 MED ORDER — SODIUM CHLORIDE 0.9 % IV SOLN
Freq: Once | INTRAVENOUS | Status: DC
  Filled 2020-10-25: qty 2

## 2020-10-25 MED ORDER — LORAZEPAM 0.5 MG PO TABS: 0.5000 mg | ORAL_TABLET | Freq: Three times a day (TID) | ORAL | 0 refills | Status: DC

## 2020-10-25 MED ORDER — METHYLPREDNISOLONE SODIUM SUCC 125 MG IJ SOLR: 125.0000 mg | Freq: Once | INTRAMUSCULAR | Status: DC | PRN

## 2020-10-25 MED ORDER — HYDROMORPHONE HCL 1 MG/ML IJ SOLN: 1.0000 mg | Freq: Once | INTRAMUSCULAR | Status: DC | PRN

## 2020-10-25 MED ORDER — CEFAZOLIN SODIUM-DEXTROSE 2-4 GM/100ML-% IV SOLN: 2.0000 g | Freq: Once | INTRAVENOUS | Status: DC

## 2020-10-25 MED ORDER — DIPHENHYDRAMINE HCL 50 MG/ML IJ SOLN: 50.0000 mg | Freq: Once | INTRAMUSCULAR | Status: DC | PRN

## 2020-10-25 MED ORDER — CHLORHEXIDINE GLUCONATE CLOTH 2 % EX PADS
6.0000 | MEDICATED_PAD | Freq: Every day | CUTANEOUS | Status: DC
  Administered 2020-10-25: 6 via TOPICAL

## 2020-10-25 MED ORDER — SODIUM CHLORIDE 0.9 % IV SOLN
INTRAVENOUS | Status: DC
  Administered 2020-10-25: 1000 mL via INTRAVENOUS

## 2020-10-25 MED ORDER — ONDANSETRON HCL 4 MG/2ML IJ SOLN: 4.0000 mg | Freq: Four times a day (QID) | INTRAMUSCULAR | Status: DC | PRN

## 2020-10-25 MED ORDER — CEFAZOLIN SODIUM-DEXTROSE 2-4 GM/100ML-% IV SOLN
INTRAVENOUS | Status: AC
  Filled 2020-10-25: qty 100

## 2020-10-25 SURGICAL SUPPLY — 7 items
DERMABOND ADVANCED (GAUZE/BANDAGES/DRESSINGS) ×1
DERMABOND ADVANCED .7 DNX12 (GAUZE/BANDAGES/DRESSINGS) ×1 IMPLANT
KIT PORT POWER 8FR ISP CVUE (Port) ×2 IMPLANT
PACK ANGIOGRAPHY (CUSTOM PROCEDURE TRAY) ×2 IMPLANT
SUT MNCRL AB 4-0 PS2 18 (SUTURE) ×2 IMPLANT
SUT VIC AB 3-0 CT1 27 (SUTURE) ×1
SUT VIC AB 3-0 CT1 TAPERPNT 27 (SUTURE) ×1 IMPLANT

## 2020-10-25 NOTE — Interval H&P Note (Signed)
History and Physical Interval Note:  10/25/2020 12:09 PM  Paul Hebert  has presented today for surgery, with the diagnosis of Port Placement   Squamous cell carcinoma of oropharynx Pt to have Covid test on 1-6.  The various methods of treatment have been discussed with the patient and family. After consideration of risks, benefits and other options for treatment, the patient has consented to  Procedure(s): PORTA CATH INSERTION (N/A) as a surgical intervention.  The patient's history has been reviewed, patient examined, no change in status, stable for surgery.  I have reviewed the patient's chart and labs.  Questions were answered to the patient's satisfaction.     Leotis Pain

## 2020-10-25 NOTE — Discharge Instructions (Signed)
Moderate Conscious Sedation, Adult, Care After This sheet gives you information about how to care for yourself after your procedure. Your health care provider may also give you more specific instructions. If you have problems or questions, contact your health care provider. What can I expect after the procedure? After the procedure, it is common to have:  Sleepiness for several hours.  Impaired judgment for several hours.  Difficulty with balance.  Vomiting if you eat too soon. Follow these instructions at home: For the time period you were told by your health care provider:  Rest.  Do not participate in activities where you could fall or become injured.  Do not drive or use machinery.  Do not drink alcohol.  Do not take sleeping pills or medicines that cause drowsiness.  Do not make important decisions or sign legal documents.  Do not take care of children on your own.      Eating and drinking  Follow the diet recommended by your health care provider.  Drink enough fluid to keep your urine pale yellow.  If you vomit: ? Drink water, juice, or soup when you can drink without vomiting. ? Make sure you have little or no nausea before eating solid foods.   General instructions  Take over-the-counter and prescription medicines only as told by your health care provider.  Have a responsible adult stay with you for the time you are told. It is important to have someone help care for you until you are awake and alert.  Do not smoke.  Keep all follow-up visits as told by your health care provider. This is important. Contact a health care provider if:  You are still sleepy or having trouble with balance after 24 hours.  You feel light-headed.  You keep feeling nauseous or you keep vomiting.  You develop a rash.  You have a fever.  You have redness or swelling around the IV site. Get help right away if:  You have trouble breathing.  You have new-onset confusion at  home. Summary  After the procedure, it is common to feel sleepy, have impaired judgment, or feel nauseous if you eat too soon.  Rest after you get home. Know the things you should not do after the procedure.  Follow the diet recommended by your health care provider and drink enough fluid to keep your urine pale yellow.  Get help right away if you have trouble breathing or new-onset confusion at home. This information is not intended to replace advice given to you by your health care provider. Make sure you discuss any questions you have with your health care provider. Document Revised: 01/30/2020 Document Reviewed: 08/28/2019 Elsevier Patient Education  2021 Elsevier Inc. Implanted Port Insertion, Care After This sheet gives you information about how to care for yourself after your procedure. Your health care provider may also give you more specific instructions. If you have problems or questions, contact your health care provider. What can I expect after the procedure? After the procedure, it is common to have:  Discomfort at the port insertion site.  Bruising on the skin over the port. This should improve over 3-4 days. Follow these instructions at home: Port care  After your port is placed, you will get a manufacturer's information card. The card has information about your port. Keep this card with you at all times.  Take care of the port as told by your health care provider. Ask your health care provider if you or a family member can   get training for taking care of the port at home. A home health care nurse may also take care of the port.  Make sure to remember what type of port you have. Incision care  Follow instructions from your health care provider about how to take care of your port insertion site. Make sure you: ? Wash your hands with soap and water before and after you change your bandage (dressing). If soap and water are not available, use hand sanitizer. ? Change your  dressing as told by your health care provider. ? Leave stitches (sutures), skin glue, or adhesive strips in place. These skin closures may need to stay in place for 2 weeks or longer. If adhesive strip edges start to loosen and curl up, you may trim the loose edges. Do not remove adhesive strips completely unless your health care provider tells you to do that.  Check your port insertion site every day for signs of infection. Check for: ? Redness, swelling, or pain. ? Fluid or blood. ? Warmth. ? Pus or a bad smell.      Activity  Return to your normal activities as told by your health care provider. Ask your health care provider what activities are safe for you.  Do not lift anything that is heavier than 10 lb (4.5 kg), or the limit that you are told, until your health care provider says that it is safe. General instructions  Take over-the-counter and prescription medicines only as told by your health care provider.  Do not take baths, swim, or use a hot tub until your health care provider approves. Ask your health care provider if you may take showers. You may only be allowed to take sponge baths.  Do not drive for 24 hours if you were given a sedative during your procedure.  Wear a medical alert bracelet in case of an emergency. This will tell any health care providers that you have a port.  Keep all follow-up visits as told by your health care provider. This is important. Contact a health care provider if:  You cannot flush your port with saline as directed, or you cannot draw blood from the port.  You have a fever or chills.  You have redness, swelling, or pain around your port insertion site.  You have fluid or blood coming from your port insertion site.  Your port insertion site feels warm to the touch.  You have pus or a bad smell coming from the port insertion site. Get help right away if:  You have chest pain or shortness of breath.  You have bleeding from your port  that you cannot control. Summary  Take care of the port as told by your health care provider. Keep the manufacturer's information card with you at all times.  Change your dressing as told by your health care provider.  Contact a health care provider if you have a fever or chills or if you have redness, swelling, or pain around your port insertion site.  Keep all follow-up visits as told by your health care provider. This information is not intended to replace advice given to you by your health care provider. Make sure you discuss any questions you have with your health care provider. Document Revised: 04/30/2018 Document Reviewed: 04/30/2018 Elsevier Patient Education  2021 Elsevier Inc.  

## 2020-10-25 NOTE — Op Note (Addendum)
      [facility]       Operative Note  Date: 10/25/2020  Preoperative diagnosis:  1. Oropharyngeal cancer  Postoperative diagnosis:  Same as above  Procedures: #1. Ultrasound guidance for vascular access to the right internal jugular vein. #2. Fluoroscopic guidance for placement of catheter. #3. Placement of CT compatible Port-A-Cath, right internal jugular vein.  Surgeon: Leotis Pain, MD.   Anesthesia: Local with moderate conscious sedation for approximately 27  minutes using 2 mg of Versed and 50 mcg of Fentanyl  Fluoroscopy time: less than 1 minute  Contrast used: 0  Estimated blood loss: 5 cc  Indication for the procedure:  The patient is a 50 y.o.male with oropharyngeal cancer.  The patient needs a Port-A-Cath for durable venous access, chemotherapy, lab draws, and CT scans. We are asked to place this. Risks and benefits were discussed and informed consent was obtained.  Description of procedure: The patient was brought to the vascular and interventional radiology suite.  Moderate conscious sedation was administered throughout the procedure during a face to face encounter with the patient with my supervision of the RN administering medicines and monitoring the patient's vital signs, pulse oximetry, telemetry and mental status throughout from the start of the procedure until the patient was taken to the recovery room. The right neck chest and shoulder were sterilely prepped and draped, and a sterile surgical field was created. Ultrasound was used to help visualize a patent right internal jugular vein. This was then accessed under direct ultrasound guidance without difficulty with the Seldinger needle and a permanent image was recorded. A J-wire was placed. After skin nick and dilatation, the peel-away sheath was then placed over the wire. I then anesthetized an area under the clavicle approximately 1-2 fingerbreadths. A transverse incision was created and an  inferior pocket was created with electrocautery and blunt dissection. The port was then brought onto the field, placed into the pocket and secured to the chest wall with 2 Prolene sutures. The catheter was connected to the port and tunneled from the subclavicular incision to the access site. Fluoroscopic guidance was then used to cut the catheter to an appropriate length. The catheter was then placed through the peel-away sheath and the peel-away sheath was removed. The catheter tip was parked in excellent location under fluorocoscopic guidance in the cavoatrial junction. The pocket was then irrigated with antibiotic impregnated saline and the wound was closed with a running 3-0 Vicryl and a 4-0 Monocryl. The access incision was closed with a single 4-0 Monocryl. The Huber needle was used to withdraw blood and flush the port with heparinized saline. Dermabond was then placed as a dressing. The patient tolerated the procedure well and was taken to the recovery room in stable condition.   Leotis Pain 10/25/2020 12:58 PM   This note was created with Dragon Medical transcription system. Any errors in dictation are purely unintentional.

## 2020-10-25 NOTE — Telephone Encounter (Signed)
Patient called reporting that Paul Hebert does not have the 2 prescription Dr Janese Banks sent in and would like for them to be resent to [facility] instead

## 2020-10-26 DIAGNOSIS — Z510 Encounter for antineoplastic radiation therapy: Secondary | ICD-10-CM | POA: Diagnosis not present

## 2020-10-27 NOTE — Patient Instructions (Signed)

## 2020-10-28 ENCOUNTER — Encounter (INDEPENDENT_AMBULATORY_CARE_PROVIDER_SITE_OTHER): Payer: Self-pay

## 2020-10-28 ENCOUNTER — Inpatient Hospital Stay: Payer: Medicaid Other

## 2020-10-28 ENCOUNTER — Other Ambulatory Visit: Payer: Self-pay | Admitting: *Deleted

## 2020-10-28 ENCOUNTER — Ambulatory Visit: Admission: RE | Admit: 2020-10-28 | Payer: Medicaid Other | Source: Ambulatory Visit

## 2020-10-28 MED ORDER — FENTANYL 12 MCG/HR TD PT72: 1.0000 | MEDICATED_PATCH | TRANSDERMAL | 0 refills | Status: DC

## 2020-11-01 ENCOUNTER — Inpatient Hospital Stay: Payer: Medicaid Other

## 2020-11-01 ENCOUNTER — Encounter: Payer: Self-pay | Admitting: Oncology

## 2020-11-01 ENCOUNTER — Ambulatory Visit
Admission: RE | Admit: 2020-11-01 | Discharge: 2020-11-01 | Disposition: A | Discharge status: Home / Self Care | Payer: Medicaid Other | Source: Ambulatory Visit | Attending: Radiation Oncology | Admitting: Radiation Oncology

## 2020-11-01 ENCOUNTER — Inpatient Hospital Stay: Payer: Medicaid Other | Admitting: Oncology

## 2020-11-01 ENCOUNTER — Inpatient Hospital Stay (HOSPITAL_BASED_OUTPATIENT_CLINIC_OR_DEPARTMENT_OTHER): Payer: Medicaid Other | Admitting: Oncology

## 2020-11-01 ENCOUNTER — Ambulatory Visit: Payer: Self-pay | Admitting: Surgery

## 2020-11-01 VITALS — BP 125/91 | HR 84 | Temp 98.4°F | Resp 16 | Ht 69.0 in | Wt 153.0 lb

## 2020-11-01 DIAGNOSIS — G893 Neoplasm related pain (acute) (chronic): Secondary | ICD-10-CM | POA: Diagnosis not present

## 2020-11-01 DIAGNOSIS — C109 Malignant neoplasm of oropharynx, unspecified: Secondary | ICD-10-CM

## 2020-11-01 DIAGNOSIS — Z510 Encounter for antineoplastic radiation therapy: Secondary | ICD-10-CM | POA: Diagnosis not present

## 2020-11-01 DIAGNOSIS — Z5111 Encounter for antineoplastic chemotherapy: Secondary | ICD-10-CM

## 2020-11-01 LAB — COMPREHENSIVE METABOLIC PANEL
ALT: 18 U/L (ref 0–44)
AST: 22 U/L (ref 15–41)
Albumin: 4 g/dL (ref 3.5–5.0)
Alkaline Phosphatase: 55 U/L (ref 38–126)
Anion gap: 11 (ref 5–15)
BUN: 14 mg/dL (ref 6–20)
CO2: 26 mmol/L (ref 22–32)
Calcium: 8.8 mg/dL — ABNORMAL LOW (ref 8.9–10.3)
Chloride: 99 mmol/L (ref 98–111)
Creatinine, Ser: 0.9 mg/dL (ref 0.61–1.24)
GFR, Estimated: >60 mL/min (ref >60)
Glucose, Bld: 133 mg/dL — ABNORMAL HIGH (ref 70–99)
Potassium: 3.8 mmol/L (ref 3.5–5.1)
Sodium: 136 mmol/L (ref 135–145)
Total Bilirubin: 0.3 mg/dL (ref 0.3–1.2)
Total Protein: 7 g/dL (ref 6.5–8.1)

## 2020-11-01 LAB — CBC WITH DIFFERENTIAL/PLATELET
Abs Immature Granulocytes: 0.01 10*3/uL (ref 0.00–0.07)
Basophils Absolute: 0 10*3/uL (ref 0.0–0.1)
Basophils Relative: 1 %
Eosinophils Absolute: 0.2 10*3/uL (ref 0.0–0.5)
Eosinophils Relative: 2 %
HCT: 38 % — ABNORMAL LOW (ref 39.0–52.0)
Hemoglobin: 13.7 g/dL (ref 13.0–17.0)
Immature Granulocytes: 0 %
Lymphocytes Relative: 25 %
Lymphs Abs: 1.8 10*3/uL (ref 0.7–4.0)
MCH: 33.5 pg (ref 26.0–34.0)
MCHC: 36.1 g/dL — ABNORMAL HIGH (ref 30.0–36.0)
MCV: 92.9 fL (ref 80.0–100.0)
Monocytes Absolute: 0.5 10*3/uL (ref 0.1–1.0)
Monocytes Relative: 8 %
Neutro Abs: 4.5 10*3/uL (ref 1.7–7.7)
Neutrophils Relative %: 64 %
Platelets: 212 10*3/uL (ref 150–400)
RBC: 4.09 MIL/uL — ABNORMAL LOW (ref 4.22–5.81)
RDW: 12 % (ref 11.5–15.5)
WBC: 7 10*3/uL (ref 4.0–10.5)
nRBC: 0 % (ref 0.0–0.2)

## 2020-11-01 MED ORDER — MAGNESIUM SULFATE 2 GM/50ML IV SOLN
2.0000 g | Freq: Once | INTRAVENOUS | Status: AC
  Administered 2020-11-01: 2 g via INTRAVENOUS
  Filled 2020-11-01: qty 50

## 2020-11-01 MED ORDER — PROCHLORPERAZINE MALEATE 10 MG PO TABS: 10.0000 mg | ORAL_TABLET | Freq: Four times a day (QID) | ORAL | 1 refills | Status: DC | PRN

## 2020-11-01 MED ORDER — SODIUM CHLORIDE 0.9 % IV SOLN
40.0000 mg/m2 | Freq: Once | INTRAVENOUS | Status: AC
  Administered 2020-11-01: 74 mg via INTRAVENOUS
  Filled 2020-11-01: qty 74

## 2020-11-01 MED ORDER — SODIUM CHLORIDE 0.9 % IV SOLN
Freq: Once | INTRAVENOUS | Status: AC
  Filled 2020-11-01: qty 250

## 2020-11-01 MED ORDER — SODIUM CHLORIDE 0.9 % IV SOLN
10.0000 mg | Freq: Once | INTRAVENOUS | Status: AC
  Administered 2020-11-01: 10 mg via INTRAVENOUS
  Filled 2020-11-01: qty 10

## 2020-11-01 MED ORDER — ONDANSETRON HCL 8 MG PO TABS: 8.0000 mg | ORAL_TABLET | Freq: Two times a day (BID) | ORAL | 1 refills | Status: DC | PRN

## 2020-11-01 MED ORDER — HEPARIN SOD (PORK) LOCK FLUSH 100 UNIT/ML IV SOLN
500.0000 [IU] | Freq: Once | INTRAVENOUS | Status: AC
  Administered 2020-11-01: 500 [IU] via INTRAVENOUS
  Filled 2020-11-01: qty 5

## 2020-11-01 MED ORDER — DEXAMETHASONE 4 MG PO TABS: 8.0000 mg | ORAL_TABLET | Freq: Every day | ORAL | 1 refills | Status: DC

## 2020-11-01 MED ORDER — POTASSIUM CHLORIDE IN NACL 20-0.9 MEQ/L-% IV SOLN
Freq: Once | INTRAVENOUS | Status: AC
  Filled 2020-11-01: qty 1000

## 2020-11-01 MED ORDER — LIDOCAINE-PRILOCAINE 2.5-2.5 % EX CREA: TOPICAL_CREAM | CUTANEOUS | 3 refills | Status: DC

## 2020-11-01 MED ORDER — SODIUM CHLORIDE 0.9% FLUSH
10.0000 mL | INTRAVENOUS | Status: DC | PRN
  Filled 2020-11-01: qty 10

## 2020-11-01 MED ORDER — GABAPENTIN 300 MG PO CAPS: 300.0000 mg | ORAL_CAPSULE | Freq: Three times a day (TID) | ORAL | 2 refills | Status: DC

## 2020-11-01 MED ORDER — PALONOSETRON HCL INJECTION 0.25 MG/5ML
0.2500 mg | Freq: Once | INTRAVENOUS | Status: AC
  Administered 2020-11-01: 0.25 mg via INTRAVENOUS
  Filled 2020-11-01: qty 5

## 2020-11-01 MED ORDER — SODIUM CHLORIDE 0.9% FLUSH
10.0000 mL | INTRAVENOUS | Status: DC | PRN
  Administered 2020-11-01: 10 mL via INTRAVENOUS
  Filled 2020-11-01: qty 10

## 2020-11-01 MED ORDER — SODIUM CHLORIDE 0.9 % IV SOLN
150.0000 mg | Freq: Once | INTRAVENOUS | Status: AC
  Administered 2020-11-01: 150 mg via INTRAVENOUS
  Filled 2020-11-01: qty 5

## 2020-11-01 NOTE — Progress Notes (Signed)
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 He continued to have on and off pain which was gradually getting worse and he presented back to the ER on 09/27/2020.   CT soft tissue neck showed a left base of tongue mass measuring 3.4 x 2.6 x 3.3 cm which was invading the tongue along the anterior and medial margin and extending into the left vallecula. Possible superior and posterior extension to involve the left palatine tonsil the lesion abuts the uvula with early extension into the soft palate. Multiple left level 2 lymph nodes largest of which was 1.2 cm. Additional necrotic limited to level 3 nodal conglomerate measures 1.3 cm. No right-sided adenopathy.  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  PET CT scan Showed left base of tongue mass measuring about 2.8 cm with enlarged centrally necrotic level 2/3 left-sided and lymph nodes with an SUV about 3.7.  No hypermetabolic right neck nodes.  No findings of distant metastatic disease Plan is for concurrent weekly cisplatin with radiation treatment  Interval history-he still has significant involving the left side of his throat that radiates to his ear. He is currently on fentanyl patch 12 mcg and as needed oxycodone which is not controlling his pain  well  ECOG PS- 0 Pain scale- 9 Opioid associated constipation- no  Review of systems- Review of Systems  Constitutional: Negative for chills, fever, malaise/fatigue and weight loss.  HENT: Negative for congestion, ear discharge and nosebleeds.        Left-sided neck pain  Eyes: Negative for blurred vision.  Respiratory: Negative for cough, hemoptysis, sputum production, shortness of breath and wheezing.   Cardiovascular: Negative for chest pain, palpitations, orthopnea and claudication.  Gastrointestinal: Negative for abdominal pain, blood in stool, constipation, diarrhea, heartburn, melena, nausea and vomiting.  Genitourinary: Negative for dysuria, flank pain, frequency, hematuria and urgency.  Musculoskeletal: Negative for back pain, joint pain and myalgias.  Skin: Negative for rash.  Neurological: Negative for dizziness, tingling, focal weakness, seizures, weakness and headaches.  Endo/Heme/Allergies: Does not bruise/bleed easily.  Psychiatric/Behavioral: Negative for depression and suicidal ideas. The patient does not have insomnia.       No Known Allergies   Past Medical History:  Diagnosis Date  . 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 PORTA CATH INSERTION N/A 10/25/2020   Procedure: PORTA CATH INSERTION;  Surgeon: Algernon Huxley, MD;  Location: [facility];  Service: Cardiovascular;  Laterality: N/A;    Social History   Socioeconomic History  . Marital status: Single    Spouse name: Not on file  . Number of children: Not on file  . Years of education: Not on file  . Highest education level: Not on file  Occupational History  . Not on file  Tobacco Use  .  Smoking status: Current Every Day Smoker  . Smokeless tobacco: Never Used  Vaping Use  . Vaping Use: Unknown  Substance and Sexual Activity  . Alcohol use: Not Currently  . Drug use: Not on file  . Sexual activity: Not on file  Other Topics Concern  . Not on  file  Social History Narrative  . Not on file   Social Determinants of Health   Financial Resource Strain: Not on file  Food Insecurity: Not on file  Transportation Needs: Not on file  Physical Activity: Not on file  Stress: Not on file  Social Connections: Not on file  Intimate Partner Violence: Not on file    Family History  Problem Relation Age of Onset  . Cancer Father      Current Outpatient Medications:  .  fentaNYL (DURAGESIC) 12 MCG/HR, Place 1 patch onto the skin every 3 (three) days., Disp: 10 patch, Rfl: 0 .  LORazepam (ATIVAN) 0.5 MG tablet, Take 1 tablet (0.5 mg total) by mouth every 8 (eight) hours., Disp: 60 tablet, Rfl: 0 .  Oxycodone HCl 10 MG TABS, Take 1 tablet (10 mg total) by mouth every 4 (four) hours as needed., Disp: 180 tablet, Rfl: 0  Physical exam:  Vitals:   11/01/20 1121  BP: (!) 125/91  Pulse: 84  Resp: 16  Temp: 98.4 F (36.9 C)  TempSrc: Oral  Weight: 153 lb (69.4 kg)  Height: 5\' 9"  (1.753 m)   Physical Exam Constitutional:      Comments: Appears in distress due to ongoing pain  HENT:     Head: Normocephalic and atraumatic.  Eyes:     Extraocular Movements: EOM normal.     Pupils: Pupils are equal, round, and reactive to light.  Cardiovascular:     Rate and Rhythm: Normal rate and regular rhythm.     Heart sounds: Normal heart sounds.  Pulmonary:     Effort: Pulmonary effort is normal.     Breath sounds: Normal breath sounds.  Abdominal:     General: Bowel sounds are normal.     Palpations: Abdomen is soft.  Musculoskeletal:     Cervical back: Normal range of motion.  Lymphadenopathy:     Comments: Palpable left cervical adenopathy  Skin:    General: Skin is warm and dry.  Neurological:     Mental Status: He is alert and oriented to person, place, and time.      CMP Latest Ref Rng & Units 09/27/2020  Glucose 70 - 99 mg/dL 119(H)  BUN 6 - 20 mg/dL 14  Creatinine 0.61 - 1.24 mg/dL 0.94  Sodium 135 - 145 mmol/L 137   Potassium 3.5 - 5.1 mmol/L 3.8  Chloride 98 - 111 mmol/L 101  CO2 22 - 32 mmol/L 23  Calcium 8.9 - 10.3 mg/dL 9.1  Total Protein 6.5 - 8.1 g/dL 7.5  Total Bilirubin 0.3 - 1.2 mg/dL 0.7  Alkaline Phos 38 - 126 U/L 59  AST 15 - 41 U/L 26  ALT 0 - 44 U/L 15   CBC Latest Ref Rng & Units 09/27/2020  WBC 4.0 - 10.5 K/uL 9.5  Hemoglobin 13.0 - 17.0 g/dL 14.5  Hematocrit 39.0 - 52.0 % 40.1  Platelets 150 - 400 K/uL 236    No images are attached to the encounter.  PERIPHERAL VASCULAR CATHETERIZATION  Result Date: 10/25/2020 See op note  NM PET Image Initial (PI) Skull Base To Thigh  Result Date: 10/14/2020 CLINICAL DATA:  Initial treatment strategy for  left base of tongue mass with necrotic left neck lymph nodes. 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 EXAM: NUCLEAR MEDICINE PET SKULL BASE TO THIGH TECHNIQUE: 7.9 mCi F-18 FDG was injected intravenously. Full-ring PET imaging was performed from the skull base to thigh after the radiotracer. CT data was obtained and used for attenuation correction and anatomic localization. Fasting blood glucose: 121 mg/dl COMPARISON:  76/22/6333 neck CT. FINDINGS: Mediastinal blood pool activity: SUV max 2.0 Liver activity: SUV max NA NECK: Hypermetabolic left base of tongue mass measuring approximately 2.8 cm with max SUV 10.8 (series 3/image 40). Mildly enlarged centrally necrotic left level 2/3 neck lymph nodes with low level hypermetabolism. Representative 1.2 cm high left level 2 node with max SUV 3.7 (series 3/image 40). Representative 1.2 cm low left level 2/upper level 3 node with max SUV 3.4 (series 3/image 52). No hypermetabolic right neck lymph nodes. No hypermetabolic lateral retropharyngeal nodes. Incidental CT findings: none CHEST: No enlarged or hypermetabolic axillary, mediastinal or hilar lymph nodes. No hypermetabolic pulmonary findings. Incidental CT findings: Mildly atherosclerotic nonaneurysmal thoracic aorta. Mild  centrilobular and paraseptal emphysema. Tiny 3 mm dense anterior right lower lobe (series 3/image 130) and 3 mm anterior left lower lobe (series 3/image 106) solid pulmonary nodules, below PET resolution. ABDOMEN/PELVIS: No abnormal hypermetabolic activity within the liver, pancreas, adrenal glands, or spleen. No hypermetabolic lymph nodes in the abdomen or pelvis. Incidental CT findings: Atherosclerotic nonaneurysmal abdominal aorta. SKELETON: No focal hypermetabolic activity to suggest skeletal metastasis. Incidental CT findings: none IMPRESSION: 1. Hypermetabolic left base of tongue mass compatible with known primary malignancy in this location. 2. Hypermetabolic left level 2/3 neck lymph nodes. 3. No hypermetabolic metastatic disease in the chest, abdomen, pelvis or skeleton. 4. Two tiny 3 mm solid pulmonary nodules, below PET resolution. Recommend follow-up chest CT in 3 months. 5. Aortic Atherosclerosis (ICD10-I70.0) and Emphysema (ICD10-J43.9). Electronically Signed   By: Delbert Phenix M.D.   On: 10/14/2020 15:54   [place] CORE BIOPSY (LYMPH NODES)  Result Date: 10/07/2020 INDICATION: Base of tongue tumor, left cervical adenopathy. EXAM: ULTRASOUND GUIDED CORE BIOPSY OF LEFT CERVICAL ADENOPATHY MEDICATIONS: Lidocaine 1% subcutaneous ANESTHESIA/SEDATION: None PROCEDURE: The procedure, risks, benefits, and alternatives were explained to the patient. Questions regarding the procedure were encouraged and answered. The patient understands and consents to the procedure. Survey ultrasound elect Penelope Coop was performed. Complex left cervical adenopathy was localized corresponding to recent CT neck, and an appropriate skin entry site was determined and marked. The operative field was prepped with chlorhexidine in a sterile fashion, and a sterile drape was applied covering the operative field. A sterile gown and sterile gloves were used for the procedure. Local anesthesia was provided with 1% Lidocaine. Under real-time  ultrasound guidance, multiple 18-gauge core biopsy samples were obtained, submitted in saline on Telfa to surgical pathology. The guide needle was removed. Postprocedure scans show no hemorrhage or other apparent complication. The patient tolerated the procedure well. COMPLICATIONS: None immediate. FINDINGS: Complex left cervical adenopathy was localized corresponding to recent CT findings. Representative core biopsy samples obtained as above. IMPRESSION: 1. Technically successful ultrasound-guided core biopsy, left cervical adenopathy. Electronically Signed   By: Corlis Leak M.D.   On: 10/07/2020 14:28     Assessment and plan- Patient is a 50 y.o. male with stage I HPV positive squamous cell carcinoma of the oropharynx/left base of tongue T2 N1 M0 here for on treatment assessment prior to cycle 1 of weekly cisplatin chemotherapy  Counts okay to proceed with  cycle 1 of weekly cisplatin chemotherapy today.  I will see him back in 1 week's time for cycle 2.  He starts radiation treatment today as well.  Neoplasm related pain: Currently he is on fentanyl patch 12 mcg along with as needed oxycodone 10 mg every 4 hours as needed.  Pain is likely to improve after he starts radiation treatment. I will also add a gabapentin 300 mg 3 times daily starting today. Continue to monitor  Protein calorie malnutrition: Patient reports significant throat pain especially when he swallows.  The food irritates the mass and causes him to bleed intermittently.  He is therefore not been able to keep up with his oral intake.  Plan was to get a feeding tube placed today but due to weather he will not be completing his chemotherapy in a timely manner to get a feeding tube today.  This will therefore have to be rescheduled and we will get in touch with Dr. Dahlia Byes  about this     Visit Diagnosis 1. Encounter for antineoplastic chemotherapy   2. Squamous cell carcinoma of oropharynx ([place])   3. Neoplasm related pain      Dr.  Randa Evens, MD, MPH [facility] [phone] 11/01/2020 10:26 AM

## 2020-11-01 NOTE — Progress Notes (Signed)
Pt tolerated infusion well. No s/s of distress or reaction noted. Pt stable at discharge.  

## 2020-11-01 NOTE — Progress Notes (Signed)
Pt says that his cancer is causing pain to radiate to his left ear. Rating pain 9. The pain does not get better and he is taking patch, oxycodone and ativan. The ear pain is severe

## 2020-11-01 NOTE — Progress Notes (Signed)
START ON PATHWAY REGIMEN - Head and Neck     A cycle is every 7 days:     Cisplatin   **Always confirm dose/schedule in your pharmacy ordering system**  Patient Characteristics: Oropharynx, HPV Positive, Preoperative or Nonsurgical Candidate (Clinical Staging), cT0-4, cN1-3 or cT3-4, cN0 Disease Classification: Oropharynx HPV Status: Positive (+) Therapeutic Status: Preoperative or Nonsurgical Candidate (Clinical Staging) AJCC T Category: cT2 AJCC 8 Stage Grouping: I AJCC N Category: cN1 AJCC M Category: cM0 Intent of Therapy: Curative Intent, Discussed with Patient 

## 2020-11-02 ENCOUNTER — Other Ambulatory Visit: Payer: Self-pay | Admitting: Internal Medicine

## 2020-11-02 ENCOUNTER — Ambulatory Visit
Admission: RE | Admit: 2020-11-02 | Discharge: 2020-11-02 | Disposition: A | Discharge status: Home / Self Care | Payer: Medicaid Other | Source: Ambulatory Visit | Attending: Radiation Oncology | Admitting: Radiation Oncology

## 2020-11-02 ENCOUNTER — Other Ambulatory Visit: Payer: Self-pay

## 2020-11-02 ENCOUNTER — Ambulatory Visit: Payer: Medicaid Other | Attending: Internal Medicine

## 2020-11-02 DIAGNOSIS — Z510 Encounter for antineoplastic radiation therapy: Secondary | ICD-10-CM | POA: Diagnosis not present

## 2020-11-02 DIAGNOSIS — Z23 Encounter for immunization: Secondary | ICD-10-CM

## 2020-11-02 NOTE — Progress Notes (Signed)
   Covid-19 Vaccination Clinic  Name:  Paul Hebert    MRN: [id number] DOB: 1971-02-27  11/02/2020  [name] was observed post Covid-19 immunization for 15 minutes without incident. He was provided with Vaccine Information Sheet and instruction to access the V-Safe system.   [name] was instructed to call 911 with any severe reactions post vaccine: Marland Kitchen Difficulty breathing  . Swelling of face and throat  . A fast heartbeat  . A bad rash all over body  . Dizziness and weakness   Immunizations Administered    Name Date Dose VIS Date Route   Pfizer COVID-19 Vaccine 11/02/2020 11:13 AM 0.3 mL 08/04/2020 Intramuscular   Manufacturer: [employer]   Lot: [id number]   [place]: [id number]

## 2020-11-03 ENCOUNTER — Encounter: Payer: Self-pay | Admitting: Surgery

## 2020-11-03 ENCOUNTER — Ambulatory Visit (INDEPENDENT_AMBULATORY_CARE_PROVIDER_SITE_OTHER): Payer: Medicaid Other | Admitting: Surgery

## 2020-11-03 ENCOUNTER — Ambulatory Visit
Admission: RE | Admit: 2020-11-03 | Discharge: 2020-11-03 | Disposition: A | Discharge status: Home / Self Care | Payer: Medicaid Other | Source: Ambulatory Visit | Attending: Radiation Oncology | Admitting: Radiation Oncology

## 2020-11-03 ENCOUNTER — Telehealth: Payer: Self-pay | Admitting: *Deleted

## 2020-11-03 ENCOUNTER — Telehealth: Payer: Self-pay

## 2020-11-03 ENCOUNTER — Other Ambulatory Visit: Payer: Self-pay | Admitting: *Deleted

## 2020-11-03 VITALS — BP 160/116 | HR 97 | Temp 98.3°F | Ht 68.0 in | Wt 152.8 lb

## 2020-11-03 DIAGNOSIS — C109 Malignant neoplasm of oropharynx, unspecified: Secondary | ICD-10-CM

## 2020-11-03 DIAGNOSIS — Z510 Encounter for antineoplastic radiation therapy: Secondary | ICD-10-CM | POA: Diagnosis not present

## 2020-11-03 DIAGNOSIS — F17210 Nicotine dependence, cigarettes, uncomplicated: Secondary | ICD-10-CM

## 2020-11-03 NOTE — Patient Instructions (Addendum)
Our surgery scheduler Pamala Hurry will contact you within the next 24-48 hours to discuss the preparation prior to surgery and also discuss the times to align with your schedule. Please have the BLUE sheet when she contacts you. If you have any questions or concerns, please do not hesitate to give our office a call.  Gastrostomy Tube Home Guide, Adult A gastrostomy tube, or G-tube, is a tube that is inserted through the abdomen into the stomach. The tube is used to give feedings and medicines when a person cannot eat and drink enough on his or her own or take medicines by mouth. How to care for the insertion site Supplies needed:  Saline solution or clean, warm water and soap. Saline solution is made of salt and water.  Cotton swab or gauze.  Pre-cut gauze bandage (dressing) and tape, if needed. Instructions Follow these steps daily to clean the insertion site: 1. Wash your hands with soap and water for at least 20 seconds. 2. Remove the dressing (if there is one) that is between the person's skin and the tube. 3. Check the area where the tube enters the skin. Check daily for problems such as: ? Redness, rash, or irritation. ? Swelling. ? Pus-like drainage. ? Extra skin growth. 4. Moisten the cotton swab or gauze with the saline solution or with a soap-and-water mixture. Gently clean around the insertion site. Remove any drainage or crusted material. ? When the G-tube is first put in, a normal saline solution or water can be used to clean the skin. ? After the skin around the tube has healed, mild soap and water may be used. 5. Apply a dressing (if there should be one) between the person's skin and the tube.   How to flush a G-tube Flush the G-tube regularly to keep it from clogging. Flush it before and after feedings and as often as told by the health care provider. Supplies needed:  Purified or germ-free (sterile) water, warmed.  Container with lid for boiling water, if needed.  60 cc  G-tube syringe. Instructions Before you begin, decide whether to use sterile water or purified drinking water.  Use only sterile water if: ? The person has a weak disease-fighting (immune) system. ? The person has trouble fighting off infections (is immunocompromised). ? You are unsure about the amount of chemical contaminants in purified or drinking water.  Use purified drinking water in all other cases. To purify drinking water by boiling: ? Boil water for at least 1 minute. Keep lid over water while it boils. ? Let water cool to room temperature before using. Follow these steps to flush the G-tube: 1. Wash your hands with soap and water for at least 20 seconds. 2. Bring out (draw up) 30 mL of warm water in a syringe. 3. Connect the syringe to the tube. 4. Slowly and gently push the water into the tube. General tips  If the tube comes out: ? Cover the opening with a clean dressing and tape. ? Get help right away.  If there is skin or scar tissue growing where the tube enters the skin: ? Keep the area clean and dry. ? Secure the tube with tape so that the tube does not move around too much.  If the tube gets clogged: ? Slowly push warm water into the tube with a large syringe. ? Do not force the fluid into the tube or push an object into the tube. ? Get help right away if you cannot unclog the  tube. Follow these instructions at home: Feedings  Give feedings at room temperature.  If feedings are continuous: ? Do not put more than 4 hours' worth of feedings in the feeding bag. ? Stop the feedings when you need to give medicine or flush the tube. Be sure to restart the feedings. ? Make sure the person's head is above his or her stomach (upright position). This will prevent choking and discomfort.  Make sure the person is in the right position during and after feedings. ? During feedings, have the person in the upright position. ? After a non-continuous feeding (bolus  feeding), have the person stay in the upright position for 1 hour.  Cover and place unused feedings in the refrigerator.  Replace feeding bags and syringes as told. Good hygiene  Make sure the person takes good care of his or her mouth and teeth (oral hygiene), such as by brushing his or her teeth.  Keep the area where the tube enters the skin clean and dry. General instructions  Use syringes made only for G-tubes.  Do not pull or put tension on the tube.  Before you remove the tube cap or disconnect a syringe, close the tube by using a clamp (clamping) or bending (kinking) the tube.  Measure the length of the G-tube every day from the insertion site to the end of the tube.  If the person's G-tube has a balloon, check the fluid in the balloon every week. Check the manufacturer's specifications to find the amount of fluid that should be in the balloon.  Remove excess air from the G-tube as told. This is called venting.  Do not push feedings, medicines, or flushes fast. Contact a health care provider if:  The person with the tube has constipation or a fever.  A large amount of fluid or mucus-like liquid is leaking from the tube.  Skin or scar tissue appears to be growing where the tube enters the skin.  The length of tube from the insertion site to the G-tube gets longer. Get help right away if:  The person with the tube has any of these problems: ? Severe pain, tenderness, or bloating in the abdomen. ? Nausea or vomiting. ? Trouble breathing or shortness of breath.  Any of these problems happen in the area where the tube enters the skin: ? Redness, irritation, swelling, or soreness. ? Pus-like discharge. ? A bad smell.  The tube is clogged and cannot be flushed.  The tube comes out. The tube will need to put back in within 4 hours. Summary  A gastrostomy tube, or G-tube, is a tube that is inserted through the abdomen into the stomach. The tube is used to give  feedings and medicines when a person cannot eat and drink enough on his or her own or cannot take medicine by mouth.  Check and clean the insertion site daily as told by the person's health care provider.  Flush the G-tube regularly to keep it from clogging. Flush it before and after feedings and as often as told.  Keep the area where the tube enters the skin clean and dry. This information is not intended to replace advice given to you by your health care provider. Make sure you discuss any questions you have with your health care provider. Document Revised: 02/16/2020 Document Reviewed: 02/19/2020 Elsevier Patient Education  [employer].

## 2020-11-03 NOTE — Telephone Encounter (Addendum)
Patient called reporting that he will be out of his pain medicine in the morning due to having to double up on it to control his pain. He states Dr Janese Banks said to let her know before he runs out if he had to do this. Please reorder with new directions if in agreement

## 2020-11-03 NOTE — H&P (View-Only) (Signed)
Patient ID: Paul Hebert, male   DOB: 1970-10-27, 50 y.o.   MRN: [id number]  HPI Paul Hebert is a 50 y.o. male in consultation at the request of Dr. Janese Banks for gastrostomy tube.  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  He has lost significant weight over the last few months.  He now has some dysphagia for solids and definitely significant pain when taking large meals.  He also reports some left sided neck pain that is intermittent and chronic in nature moderate intensity and sharp.  He has started on chemoradiation therapy already.  He is serous starting to feels worsening pain in his left neck. Initially he thought he was able to cope with the pain and with the p.o. intake but he is realized that he will be able to do it.  I have personally reviewed his PET/CT as well as a CT of the neck showing evidence of a mass Left neck.  There is also evidence of lymphadenopathy.  Abdominally there is no acute abnormalities.  There is no contraindication for surgical G-tube. He is able to perform more than 4 METS of activity without any shortness of breath or chest pain.  He denies any previous abdominal operations.  HPI  Past Medical History:  Diagnosis Date  . 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 PORTA CATH INSERTION N/A 10/25/2020   Procedure: PORTA CATH INSERTION;  Surgeon: Algernon Huxley, MD;  Location: [facility];  Service: Cardiovascular;  Laterality: N/A;    Family History  Problem Relation Age of Onset  . Cancer Father     Social History Social History   Tobacco Use  . Smoking status: Current Every Day Smoker    Packs/day: 1.00  . Smokeless tobacco: Never Used  Vaping Use  . Vaping Use: Never used  Substance Use Topics  . Alcohol use: Not Currently    No Known Allergies  Current Outpatient Medications  Medication Sig Dispense Refill  . dexamethasone (DECADRON) 4 MG tablet Take  2 tablets (8 mg total) by mouth daily. Take daily x 3 days starting the day after cisplatin chemotherapy. Take with food. 30 tablet 1  . fentaNYL (DURAGESIC) 12 MCG/HR Place 1 patch onto the skin every 3 (three) days. 10 patch 0  . gabapentin (NEURONTIN) 300 MG capsule Take 1 capsule (300 mg total) by mouth 3 (three) times daily. 90 capsule 2  . lidocaine-prilocaine (EMLA) cream Apply to affected area once 30 g 3  . LORazepam (ATIVAN) 0.5 MG tablet Take 1 tablet (0.5 mg total) by mouth every 8 (eight) hours. 60 tablet 0  . ondansetron (ZOFRAN) 8 MG tablet Take 1 tablet (8 mg total) by mouth 2 (two) times daily as needed. Start on the third day after cisplatin chemotherapy. 30 tablet 1  . Oxycodone HCl 10 MG TABS Take 1 tablet (10 mg total) by mouth every 4 (four) hours as needed. 180 tablet 0  . prochlorperazine (COMPAZINE) 10 MG tablet Take 1 tablet (10 mg total) by mouth every 6 (six) hours as needed (Nausea or vomiting). 30 tablet 1   No current facility-administered medications for this visit.     Review of Systems Full ROS  was asked and was negative except for the information on the HPI  Physical Exam Blood pressure (!) 160/116, pulse 97, temperature 98.3 F (36.8 C), temperature source Oral, height 5\' 8"  (1.727 m),  weight 152 lb 12.8 oz (69.3 kg), SpO2 97 %. CONSTITUTIONAL:NAD EYES: Pupils are equal, round, and reactive to light, Sclera are non-icteric. EARS, NOSE, MOUTH AND THROAT: Wearing a mask,  Hearing is intact to voice. Neck left neck fullness with LAD, ntrach midline, no stridor.  RESPIRATORY:  Lungs are clear. There is normal respiratory effort, with equal breath sounds bilaterally, and without pathologic use of accessory muscles. CARDIOVASCULAR: Heart is regular without murmurs, gallops, or rubs. GI: The abdomen is  soft, nontender, and nondistended. There are no palpable masses. There is no hepatosplenomegaly. There are normal bowel sounds in all quadrants. GU: Rectal  deferred.   MUSCULOSKELETAL: Normal muscle strength and tone. No cyanosis or edema.   SKIN: Turgor is good and there are no pathologic skin lesions or ulcers. NEUROLOGIC: Motor and sensation is grossly normal. Cranial nerves are grossly intact. PSYCH:  Oriented to person, place and time. Affect is normal.  Data Reviewed  I have personally reviewed the patient's imaging, laboratory findings and medical records.    Assessment/Plan 50 year old male with history of head and neck cancer with significant weight loss and malnutrition undergoing chemoradiation therapy.  He is currently in need for enteral access to optimize his nutritional status.  They will be a good candidate for robotic gastrostomy tube.  Procedure discussed with patient detail.  Risks, benefits and possible indications including but not limited to: Bleeding, infection injury to adjacent organs, catheter malfunction and potential intervention we will tentatively put him on the schedule next week. A copy of the report was sent to the referring provider    Caroleen Hamman, MD FACS General Surgeon 11/03/2020, 1:25 PM

## 2020-11-03 NOTE — Progress Notes (Signed)
Patient ID: Paul Hebert, male   DOB: 1970-10-27, 50 y.o.   MRN: [id number]  HPI Paul Hebert is a 50 y.o. male in consultation at the request of Dr. Janese Banks for gastrostomy tube.  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  He has lost significant weight over the last few months.  He now has some dysphagia for solids and definitely significant pain when taking large meals.  He also reports some left sided neck pain that is intermittent and chronic in nature moderate intensity and sharp.  He has started on chemoradiation therapy already.  He is serous starting to feels worsening pain in his left neck. Initially he thought he was able to cope with the pain and with the p.o. intake but he is realized that he will be able to do it.  I have personally reviewed his PET/CT as well as a CT of the neck showing evidence of a mass Left neck.  There is also evidence of lymphadenopathy.  Abdominally there is no acute abnormalities.  There is no contraindication for surgical G-tube. He is able to perform more than 4 METS of activity without any shortness of breath or chest pain.  He denies any previous abdominal operations.  HPI  Past Medical History:  Diagnosis Date  . 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 PORTA CATH INSERTION N/A 10/25/2020   Procedure: PORTA CATH INSERTION;  Surgeon: Algernon Huxley, MD;  Location: [facility];  Service: Cardiovascular;  Laterality: N/A;    Family History  Problem Relation Age of Onset  . Cancer Father     Social History Social History   Tobacco Use  . Smoking status: Current Every Day Smoker    Packs/day: 1.00  . Smokeless tobacco: Never Used  Vaping Use  . Vaping Use: Never used  Substance Use Topics  . Alcohol use: Not Currently    No Known Allergies  Current Outpatient Medications  Medication Sig Dispense Refill  . dexamethasone (DECADRON) 4 MG tablet Take  2 tablets (8 mg total) by mouth daily. Take daily x 3 days starting the day after cisplatin chemotherapy. Take with food. 30 tablet 1  . fentaNYL (DURAGESIC) 12 MCG/HR Place 1 patch onto the skin every 3 (three) days. 10 patch 0  . gabapentin (NEURONTIN) 300 MG capsule Take 1 capsule (300 mg total) by mouth 3 (three) times daily. 90 capsule 2  . lidocaine-prilocaine (EMLA) cream Apply to affected area once 30 g 3  . LORazepam (ATIVAN) 0.5 MG tablet Take 1 tablet (0.5 mg total) by mouth every 8 (eight) hours. 60 tablet 0  . ondansetron (ZOFRAN) 8 MG tablet Take 1 tablet (8 mg total) by mouth 2 (two) times daily as needed. Start on the third day after cisplatin chemotherapy. 30 tablet 1  . Oxycodone HCl 10 MG TABS Take 1 tablet (10 mg total) by mouth every 4 (four) hours as needed. 180 tablet 0  . prochlorperazine (COMPAZINE) 10 MG tablet Take 1 tablet (10 mg total) by mouth every 6 (six) hours as needed (Nausea or vomiting). 30 tablet 1   No current facility-administered medications for this visit.     Review of Systems Full ROS  was asked and was negative except for the information on the HPI  Physical Exam Blood pressure (!) 160/116, pulse 97, temperature 98.3 F (36.8 C), temperature source Oral, height 5\' 8"  (1.727 m),  weight 152 lb 12.8 oz (69.3 kg), SpO2 97 %. CONSTITUTIONAL:NAD EYES: Pupils are equal, round, and reactive to light, Sclera are non-icteric. EARS, NOSE, MOUTH AND THROAT: Wearing a mask,  Hearing is intact to voice. Neck left neck fullness with LAD, ntrach midline, no stridor.  RESPIRATORY:  Lungs are clear. There is normal respiratory effort, with equal breath sounds bilaterally, and without pathologic use of accessory muscles. CARDIOVASCULAR: Heart is regular without murmurs, gallops, or rubs. GI: The abdomen is  soft, nontender, and nondistended. There are no palpable masses. There is no hepatosplenomegaly. There are normal bowel sounds in all quadrants. GU: Rectal  deferred.   MUSCULOSKELETAL: Normal muscle strength and tone. No cyanosis or edema.   SKIN: Turgor is good and there are no pathologic skin lesions or ulcers. NEUROLOGIC: Motor and sensation is grossly normal. Cranial nerves are grossly intact. PSYCH:  Oriented to person, place and time. Affect is normal.  Data Reviewed  I have personally reviewed the patient's imaging, laboratory findings and medical records.    Assessment/Plan 50-year-old male with history of head and neck cancer with significant weight loss and malnutrition undergoing chemoradiation therapy.  He is currently in need for enteral access to optimize his nutritional status.  They will be a good candidate for robotic gastrostomy tube.  Procedure discussed with patient detail.  Risks, benefits and possible indications including but not limited to: Bleeding, infection injury to adjacent organs, catheter malfunction and potential intervention we will tentatively put him on the schedule next week. A copy of the report was sent to the referring provider    Diego Pabon, MD FACS General Surgeon 11/03/2020, 1:25 PM   

## 2020-11-03 NOTE — Telephone Encounter (Signed)
Telephone call to patient for follow up after receiving first infusion.   Patient states infusion went great.  States eating good and drinking plenty of fluids.   Denies any nausea or vomiting.  Encouraged patient to call for any concerns or questions. 

## 2020-11-04 ENCOUNTER — Ambulatory Visit
Admission: RE | Admit: 2020-11-04 | Discharge: 2020-11-04 | Disposition: A | Discharge status: Home / Self Care | Payer: Medicaid Other | Source: Ambulatory Visit | Attending: Radiation Oncology | Admitting: Radiation Oncology

## 2020-11-04 ENCOUNTER — Inpatient Hospital Stay: Payer: Medicaid Other

## 2020-11-04 ENCOUNTER — Telehealth: Payer: Self-pay | Admitting: Surgery

## 2020-11-04 ENCOUNTER — Other Ambulatory Visit: Payer: Self-pay | Admitting: *Deleted

## 2020-11-04 ENCOUNTER — Ambulatory Visit: Payer: Self-pay | Admitting: Oncology

## 2020-11-04 ENCOUNTER — Telehealth: Payer: Self-pay | Admitting: *Deleted

## 2020-11-04 ENCOUNTER — Ambulatory Visit: Payer: Self-pay

## 2020-11-04 ENCOUNTER — Other Ambulatory Visit: Payer: Self-pay

## 2020-11-04 DIAGNOSIS — Z510 Encounter for antineoplastic radiation therapy: Secondary | ICD-10-CM | POA: Diagnosis not present

## 2020-11-04 MED ORDER — OXYCODONE HCL 20 MG PO TABS: 20.0000 mg | ORAL_TABLET | ORAL | 0 refills | Status: DC | PRN

## 2020-11-04 MED ORDER — OSMOLITE 1.5 CAL PO LIQD: ORAL | 0 refills | Status: DC

## 2020-11-04 NOTE — Progress Notes (Addendum)
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  Patient receiving chemotherapy and radiation therapy.   Met with patient following radiation.  Patient in pain with eating solid foods but still trying to eat orally.  Drinking ensure shakes, 3-4 per day.  Reports eating steak recently with pain but forcing self to increase calories and protein.  Planning G-tube placement on 1/25 (Tuesday).  Reports that he is drinking water 5-6 bottles/day, tea and coffee orally.    Medications: reviwed  Labs: reviewed  Anthropometrics:   Weight increase to 152 lb on 1/19 from 146 lb 15.7 oz on 1/3.    UBW of 167-169 lb about 4 months ago   Estimated Energy Needs  Kcals: 1980-2300 Protein: 99-115 g Fluid: > 2 L  NUTRITION DIAGNOSIS: Predicted sub optimal energy intake continues   INTERVENTION:  Recommend osmolite 1.5, 6 cartons per day (1 1/2 cartons QID) Flush with 60 ml of water before and after each feeding.  Will need to drink additional 240 ml of water TID or give via feeding tube for better hydration.  Tube feeding will provide 2130 calories, 89 g protein and 2280 ml free water. Meets 100% of calorie needs.  May need to add protein modular if unable to meet protein needs with tube feeding and oral intake. Patient will start with giving 1 carton of tube feeding via feeding tube on day 1. Day 2 will give 2 cartons of tube feeding and continue to add 1 carton daily until up to goal rate.   Continue oral intake as able.  Case of ensure plus given to patient today. Complimentary case of osmolite and supplies given as well along with written instructions for tube feeding, G-tube care.   Patient does not have preference of enteral supply company.  Will contact [facility] with referral.   Patient has contact number      MONITORING, EVALUATION, GOAL: weight trends, intake, tube feeding   NEXT VISIT: Jan 27 phone  Mianna Iezzi B. Zenia Resides, [place],  [place] Registered Dietitian [phone] (mobile)

## 2020-11-04 NOTE — Telephone Encounter (Signed)
Patient has been advised of Pre-Admission date/time, COVID Testing date and Surgery date.  Surgery Date: 11/09/20 Preadmission Testing Date: 11/08/20(phone 8a1p- Covid Testing Date: 11/05/20 - patient advised to go to the [facility] ([place]) between 8a-1p  Patient has been made aware to call [phone], between 1-3:00pm the day before surgery, to find out what time to arrive for surgery.

## 2020-11-04 NOTE — Addendum Note (Signed)
Addended by: Jennet Maduro B on: 11/04/2020 12:46 PM   Modules accepted: Orders

## 2020-11-04 NOTE — Telephone Encounter (Signed)
Pt called yest. Evening late and then when he went to radiation today he told them that he was doubling up on meds and the ear pain not getting better, and he did start the gabapentin. Dr/ Janese Banks said she will increase dose to 20 mg and every 4 hours and he is not to increase himself unless he has called in and given permission for it. I sent this message to [facility] in radiation and pt was good with that.

## 2020-11-05 ENCOUNTER — Other Ambulatory Visit
Admission: RE | Admit: 2020-11-05 | Discharge: 2020-11-05 | Disposition: A | Discharge status: Home / Self Care | Payer: HRSA Program | Source: Ambulatory Visit | Attending: Surgery | Admitting: Surgery

## 2020-11-05 ENCOUNTER — Encounter: Payer: Self-pay | Admitting: Oncology

## 2020-11-05 ENCOUNTER — Ambulatory Visit
Admission: RE | Admit: 2020-11-05 | Discharge: 2020-11-05 | Disposition: A | Discharge status: Home / Self Care | Payer: Medicaid Other | Source: Ambulatory Visit | Attending: Radiation Oncology | Admitting: Radiation Oncology

## 2020-11-05 ENCOUNTER — Other Ambulatory Visit: Payer: Self-pay

## 2020-11-05 ENCOUNTER — Other Ambulatory Visit
Admission: RE | Admit: 2020-11-05 | Discharge: 2020-11-05 | Disposition: A | Discharge status: Home / Self Care | Payer: Self-pay | Source: Ambulatory Visit | Attending: Surgery | Admitting: Surgery

## 2020-11-05 DIAGNOSIS — Z01812 Encounter for preprocedural laboratory examination: Secondary | ICD-10-CM | POA: Insufficient documentation

## 2020-11-05 DIAGNOSIS — Z510 Encounter for antineoplastic radiation therapy: Secondary | ICD-10-CM | POA: Diagnosis not present

## 2020-11-05 DIAGNOSIS — Z20822 Contact with and (suspected) exposure to covid-19: Secondary | ICD-10-CM | POA: Insufficient documentation

## 2020-11-05 LAB — SARS CORONAVIRUS 2 (TAT 6-24 HRS): SARS Coronavirus 2: NEGATIVE

## 2020-11-05 NOTE — Patient Instructions (Signed)
Your procedure is scheduled on: Tuesday November 09, 2020 Report to Day Surgery inside [facility] 2nd floor (stop by Admissions desk first, before going upstairs). To find out your arrival time please call [phone] between 1PM - 3PM on Monday November 08, 2020.  Remember: Instructions that are not followed completely may result in serious medical risk,  up to and including death, or upon the discretion of your surgeon and anesthesiologist your  surgery may need to be rescheduled.     _X__ 1. Do not eat food after midnight the night before your procedure.                 No chewing gum or hard candies. You may drink clear liquids up to 2 hours                 before you are scheduled to arrive for your surgery- DO not drink clear                 liquids within 2 hours of the start of your surgery.                 Clear Liquids include:  water, apple juice without pulp, clear Gatorade, G2 or                  Gatorade Zero (avoid Red/Purple/Blue), Black Coffee or Tea (Do not add                 anything to coffee or tea).  __X__2.  On the morning of surgery brush your teeth with toothpaste and water, you                may rinse your mouth with mouthwash if you wish.  Do not swallow any toothpaste of mouthwash.     _X__ 3.  No Alcohol for 24 hours before or after surgery.   _X__ 4.  Do Not Smoke or use e-cigarettes For 24 Hours Prior to Your Surgery.                 Do not use any chewable tobacco products for at least 6 hours prior to                 Surgery.  _X__  5.  Do not use any recreational drugs (marijuana, cocaine, heroin, ecstasy, MDMA or other)                For at least one week prior to your surgery.  Combination of these drugs with anesthesia                May have life threatening results.  __X__ 6.  Notify your doctor if there is any change in your medical condition      (cold, fever, infections).     Do not wear jewelry, make-up, hairpins,  clips or nail polish. Do not wear lotions, powders, or perfumes. You may wear deodorant. Do not shave 48 hours prior to surgery. Men may shave face and neck. Do not bring valuables to the hospital.    [facility] is not responsible for any belongings or valuables.  Contacts, dentures or bridgework may not be worn into surgery. Leave your suitcase in the car. After surgery it may be brought to your room. For patients admitted to the hospital, discharge time is determined by your treatment team.   Patients discharged the day of surgery will not be allowed to drive home.  Make arrangements for someone to be with you for the first 24 hours of your Same Day Discharge.   __X__ Take these medicines the morning of surgery with A SIP OF WATER:    1.dexamethasone (DECADRON) 4 MG   2. gabapentin (NEURONTIN) 300 MG  3. ondansetron (ZOFRAN) 8 MG   4. Oxycodone HCl 20 MG    ____ Fleet Enema (as directed)   __X__ Use Antibacterial Soap as directed  ____ Use Benzoyl Peroxide Gel as instructed  ____ Use inhalers on the day of surgery  ____ Stop metformin 2 days prior to surgery    ____ Take 1/2 of usual insulin dose the night before surgery. No insulin the morning          of surgery.   __X__ Stop Anti-inflammatories such as Ibuprofen, Aleve, Advil, naproxen, aspirin and or BC powders.    __X__ Stop supplements until after surgery.    __X__ Do not start any herbal supplements before your procedure.    If you have any questions regarding your pre-procedure instructions,  Please call Pre-admit Testing at [phone].

## 2020-11-05 NOTE — Patient Instructions (Signed)
Your procedure is scheduled on: Tuesday November 09, 2020. Report to Day Surgery inside [facility] 2nd floor (stop by Admissions desk first before going upstairs). To find out your arrival time please call [phone] between 1PM - 3PM on Monday November 08, 2020.  Remember: Instructions that are not followed completely may result in serious medical risk,  up to and including death, or upon the discretion of your surgeon and anesthesiologist your  surgery may need to be rescheduled.     _X__ 1. Do not eat food after midnight the night before your procedure.                 No chewing gum or hard candies. You may drink clear liquids up to 2 hours                 before you are scheduled to arrive for your surgery- DO not drink clear                 liquids within 2 hours of the start of your surgery.                 Clear Liquids include:  water, apple juice without pulp, clear Gatorade, G2 or                  Gatorade Zero (avoid Red/Purple/Blue), Black Coffee or Tea (Do not add                 anything to coffee or tea).  __X__2.  On the morning of surgery brush your teeth with toothpaste and water, you                may rinse your mouth with mouthwash if you wish.  Do not swallow any toothpaste of mouthwash.     _X__ 3.  No Alcohol for 24 hours before or after surgery.   _X__ 4.  Do Not Smoke or use e-cigarettes For 24 Hours Prior to Your Surgery.                 Do not use any chewable tobacco products for at least 6 hours prior to                 Surgery.  _X__  5.  Do not use any recreational drugs (marijuana, cocaine, heroin, ecstasy, MDMA or other)                For at least one week prior to your surgery.  Combination of these drugs with anesthesia                May have life threatening results.  __X__6.  Notify your doctor if there is any change in your medical condition      (cold, fever, infections).     Do not wear jewelry, make-up, hairpins,  clips or nail polish. Do not wear lotions, powders, or perfumes. You may wear deodorant. Do not shave 48 hours prior to surgery. Men may shave face and neck. Do not bring valuables to the hospital.    [facility] is not responsible for any belongings or valuables.  Contacts, dentures or bridgework may not be worn into surgery. Leave your suitcase in the car. After surgery it may be brought to your room. For patients admitted to the hospital, discharge time is determined by your treatment team.   Patients discharged the day of surgery will not be allowed to drive home.  Make arrangements for someone to be with you for the first 24 hours of your Same Day Discharge.   __X__ Take these medicines the morning of surgery with A SIP OF WATER:    1. gabapentin (NEURONTIN) 300 MG   2. ondansetron (ZOFRAN) 8 MG  3. Oxycodone HCl 20 MG  4. dexamethasone (DECADRON) 4 MG tablet    ____ Fleet Enema (as directed)   __X__ Use Antibacterial soap as directed  ____ Use Benzoyl Peroxide Gel as instructed  ____ Use inhalers on the day of surgery  ____ Stop metformin 2 days prior to surgery    ____ Take 1/2 of usual insulin dose the night before surgery. No insulin the morning          of surgery.   __x__ Stop Anti-inflammatories such as Ibuprofen, Aleve, Advil, naproxen, aspirin and or BC powders.    __x__ Stop supplements until after surgery.    __x__ Do not start any herbal supplements before your procedure.     If you have any questions regarding your pre-procedure instructions,  Please call Pre-admit Testing at [phone].

## 2020-11-08 ENCOUNTER — Inpatient Hospital Stay
Admission: RE | Admit: 2020-11-08 | Discharge: 2020-11-08 | Disposition: A | Discharge status: Home / Self Care | Payer: Medicaid Other | Source: Ambulatory Visit

## 2020-11-08 ENCOUNTER — Encounter: Payer: Self-pay | Admitting: Anesthesiology

## 2020-11-08 ENCOUNTER — Ambulatory Visit
Admission: RE | Admit: 2020-11-08 | Discharge: 2020-11-08 | Disposition: A | Discharge status: Home / Self Care | Payer: Medicaid Other | Source: Ambulatory Visit | Attending: Radiation Oncology | Admitting: Radiation Oncology

## 2020-11-08 ENCOUNTER — Other Ambulatory Visit: Payer: Self-pay

## 2020-11-08 ENCOUNTER — Inpatient Hospital Stay (HOSPITAL_BASED_OUTPATIENT_CLINIC_OR_DEPARTMENT_OTHER): Payer: Medicaid Other | Admitting: Oncology

## 2020-11-08 ENCOUNTER — Inpatient Hospital Stay: Payer: Medicaid Other

## 2020-11-08 ENCOUNTER — Ambulatory Visit: Payer: Self-pay | Admitting: Surgery

## 2020-11-08 VITALS — BP 144/95 | HR 75 | Temp 99.3°F | Resp 18 | Wt 158.3 lb

## 2020-11-08 DIAGNOSIS — Z510 Encounter for antineoplastic radiation therapy: Secondary | ICD-10-CM | POA: Diagnosis not present

## 2020-11-08 DIAGNOSIS — G893 Neoplasm related pain (acute) (chronic): Secondary | ICD-10-CM

## 2020-11-08 DIAGNOSIS — Z5111 Encounter for antineoplastic chemotherapy: Secondary | ICD-10-CM

## 2020-11-08 DIAGNOSIS — C109 Malignant neoplasm of oropharynx, unspecified: Secondary | ICD-10-CM | POA: Diagnosis not present

## 2020-11-08 LAB — BASIC METABOLIC PANEL
Anion gap: 8 (ref 5–15)
BUN: 24 mg/dL — ABNORMAL HIGH (ref 6–20)
CO2: 31 mmol/L (ref 22–32)
Calcium: 9.1 mg/dL (ref 8.9–10.3)
Chloride: 92 mmol/L — ABNORMAL LOW (ref 98–111)
Creatinine, Ser: 0.73 mg/dL (ref 0.61–1.24)
GFR, Estimated: >60 mL/min (ref >60)
Glucose, Bld: 115 mg/dL — ABNORMAL HIGH (ref 70–99)
Potassium: 4.6 mmol/L (ref 3.5–5.1)
Sodium: 131 mmol/L — ABNORMAL LOW (ref 135–145)

## 2020-11-08 LAB — CBC WITH DIFFERENTIAL/PLATELET
Abs Immature Granulocytes: 0.14 10*3/uL — ABNORMAL HIGH (ref 0.00–0.07)
Basophils Absolute: 0 10*3/uL (ref 0.0–0.1)
Basophils Relative: 0 %
Eosinophils Absolute: 0 10*3/uL (ref 0.0–0.5)
Eosinophils Relative: 0 %
HCT: 35.9 % — ABNORMAL LOW (ref 39.0–52.0)
Hemoglobin: 13.1 g/dL (ref 13.0–17.0)
Immature Granulocytes: 1 %
Lymphocytes Relative: 7 %
Lymphs Abs: 1 10*3/uL (ref 0.7–4.0)
MCH: 33.9 pg (ref 26.0–34.0)
MCHC: 36.5 g/dL — ABNORMAL HIGH (ref 30.0–36.0)
MCV: 92.8 fL (ref 80.0–100.0)
Monocytes Absolute: 0.6 10*3/uL (ref 0.1–1.0)
Monocytes Relative: 4 %
Neutro Abs: 13.7 10*3/uL — ABNORMAL HIGH (ref 1.7–7.7)
Neutrophils Relative %: 88 %
Platelets: 244 10*3/uL (ref 150–400)
RBC: 3.87 MIL/uL — ABNORMAL LOW (ref 4.22–5.81)
RDW: 11.9 % (ref 11.5–15.5)
WBC: 15.5 10*3/uL — ABNORMAL HIGH (ref 4.0–10.5)
nRBC: 0 % (ref 0.0–0.2)

## 2020-11-08 LAB — MAGNESIUM: Magnesium: 2 mg/dL (ref 1.7–2.4)

## 2020-11-08 MED ORDER — SODIUM CHLORIDE 0.9 % IV SOLN
10.0000 mg | Freq: Once | INTRAVENOUS | Status: AC
  Administered 2020-11-08: 10 mg via INTRAVENOUS
  Filled 2020-11-08: qty 10

## 2020-11-08 MED ORDER — GABAPENTIN 300 MG PO CAPS: 600.0000 mg | ORAL_CAPSULE | Freq: Three times a day (TID) | ORAL | 2 refills | Status: DC

## 2020-11-08 MED ORDER — SODIUM CHLORIDE 0.9 % IV SOLN
150.0000 mg | Freq: Once | INTRAVENOUS | Status: AC
  Administered 2020-11-08: 150 mg via INTRAVENOUS
  Filled 2020-11-08: qty 150

## 2020-11-08 MED ORDER — HEPARIN SOD (PORK) LOCK FLUSH 100 UNIT/ML IV SOLN
500.0000 [IU] | Freq: Once | INTRAVENOUS | Status: AC | PRN
  Administered 2020-11-08: 500 [IU]
  Filled 2020-11-08: qty 5

## 2020-11-08 MED ORDER — MAGNESIUM SULFATE 2 GM/50ML IV SOLN
2.0000 g | Freq: Once | INTRAVENOUS | Status: AC
  Administered 2020-11-08: 2 g via INTRAVENOUS
  Filled 2020-11-08: qty 50

## 2020-11-08 MED ORDER — HEPARIN SOD (PORK) LOCK FLUSH 100 UNIT/ML IV SOLN
INTRAVENOUS | Status: AC
  Filled 2020-11-08: qty 5

## 2020-11-08 MED ORDER — SODIUM CHLORIDE 0.9% FLUSH
10.0000 mL | INTRAVENOUS | Status: DC | PRN
  Filled 2020-11-08: qty 10

## 2020-11-08 MED ORDER — POTASSIUM CHLORIDE IN NACL 20-0.9 MEQ/L-% IV SOLN
Freq: Once | INTRAVENOUS | Status: AC
  Filled 2020-11-08: qty 1000

## 2020-11-08 MED ORDER — SODIUM CHLORIDE 0.9 % IV SOLN
Freq: Once | INTRAVENOUS | Status: AC
  Filled 2020-11-08: qty 250

## 2020-11-08 MED ORDER — SODIUM CHLORIDE 0.9 % IV SOLN
40.0000 mg/m2 | Freq: Once | INTRAVENOUS | Status: AC
  Administered 2020-11-08: 74 mg via INTRAVENOUS
  Filled 2020-11-08: qty 74

## 2020-11-08 MED ORDER — PALONOSETRON HCL INJECTION 0.25 MG/5ML
0.2500 mg | Freq: Once | INTRAVENOUS | Status: AC
  Administered 2020-11-08: 0.25 mg via INTRAVENOUS
  Filled 2020-11-08: qty 5

## 2020-11-08 NOTE — Progress Notes (Signed)
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 He continued to have on and off pain which was gradually getting worse and he presented back to the ER on 09/27/2020.   CT soft tissue neck showed a left base of tongue mass measuring 3.4 x 2.6 x 3.3 cm which was invading the tongue along the anterior and medial margin and extending into the left vallecula. Possible superior and posterior extension to involve the left palatine tonsil the lesion abuts the uvula with early extension into the soft palate. Multiple left level 2 lymph nodes largest of which was 1.2 cm. Additional necrotic limited to level 3 nodal conglomerate measures 1.3 cm. No right-sided adenopathy.  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 PET CT scan Showed left base of tongue mass measuring about 2.8 cm with enlarged centrally necrotic level 2/3 left-sided and lymph nodes with an SUV about 3.7. No hypermetabolic right neck nodes. No findings of distant metastatic disease Plan is for concurrent weekly cisplatin with radiation treatment   Interval history-patient continues to report significant left-sided facial/neck pain which radiates to his ear.  He has been using 20 mg of oxycodone every 4 hours as needed along with 12 mcg of fentanyl patch  but it has not been helping his pain adequately.  Also reports significant anxiety He will be getting a feeding tube placed tomorrow.  He is trying to eat and drink boost as much as he can ECOG PS- 0 Pain scale- 8 Opioid associated constipation- no  Review of systems- Review of Systems  Constitutional: Positive for malaise/fatigue. Negative for chills, fever and weight loss.  HENT: Negative for congestion, ear discharge and nosebleeds.        Left-sided neck/oral pain  Eyes: Negative for blurred vision.  Respiratory: Negative for cough, hemoptysis, sputum production, shortness of breath and wheezing.   Cardiovascular: Negative for chest pain, palpitations, orthopnea and claudication.  Gastrointestinal: Negative for abdominal pain, blood in stool, constipation, diarrhea, heartburn, melena, nausea and vomiting.  Genitourinary: Negative for dysuria, flank pain, frequency, hematuria and urgency.  Musculoskeletal: Negative for back pain, joint pain and myalgias.  Skin: Negative for rash.  Neurological: Negative for dizziness, tingling, focal weakness, seizures, weakness and headaches.  Endo/Heme/Allergies: Does not bruise/bleed easily.  Psychiatric/Behavioral: Negative for depression and suicidal ideas. The patient does not have insomnia.       No Known Allergies   Past Medical History:  Diagnosis Date  . 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 PORTA CATH INSERTION N/A 10/25/2020   Procedure: PORTA CATH INSERTION;  Surgeon: Algernon Huxley, MD;  Location: [facility];  Service: Cardiovascular;  Laterality: N/A;    Social History   Socioeconomic History  . Marital status: Single    Spouse name: Not on file  .  Number of children: Not on file  . Years of education: Not on file  . Highest education level: Not on file  Occupational History  . Not on file  Tobacco Use  . Smoking status: Current Every Day Smoker    Packs/day: 1.00  .  Smokeless tobacco: Never Used  Vaping Use  . Vaping Use: Never used  Substance and Sexual Activity  . Alcohol use: Yes    Comment: rare   . Drug use: Not on file  . Sexual activity: Not on file  Other Topics Concern  . Not on file  Social History Narrative  . Not on file   Social Determinants of Health   Financial Resource Strain: Not on file  Food Insecurity: Not on file  Transportation Needs: Not on file  Physical Activity: Not on file  Stress: Not on file  Social Connections: Not on file  Intimate Partner Violence: Not on file    Family History  Problem Relation Age of Onset  . Cancer Father      Current Outpatient Medications:  .  dexamethasone (DECADRON) 4 MG tablet, Take 2 tablets (8 mg total) by mouth daily. Take daily x 3 days starting the day after cisplatin chemotherapy. Take with food. (Patient taking differently: Take 8 mg by mouth See admin instructions. Take 8 mg daily x 3 days starting the day after cisplatin chemotherapy. Take with food.), Disp: 30 tablet, Rfl: 1 .  fentaNYL (DURAGESIC) 12 MCG/HR, Place 1 patch onto the skin every 3 (three) days., Disp: 10 patch, Rfl: 0 .  gabapentin (NEURONTIN) 300 MG capsule, Take 1 capsule (300 mg total) by mouth 3 (three) times daily., Disp: 90 capsule, Rfl: 2 .  lidocaine-prilocaine (EMLA) cream, Apply to affected area once (Patient taking differently: Apply 1 application topically daily as needed (port access).), Disp: 30 g, Rfl: 3 .  LORazepam (ATIVAN) 0.5 MG tablet, Take 1 tablet (0.5 mg total) by mouth every 8 (eight) hours., Disp: 60 tablet, Rfl: 0 .  Nutritional Supplements (FEEDING SUPPLEMENT, OSMOLITE 1.5 CAL,) LIQD, Give 1 1/2 cartons of formula 4 times per day via feeding tube (8am, noon, 4pm and 8pm). Flush with 48ml of water before and after each feeding. Drink or give additional 255ml water TID between feedings via tube for better hydration., Disp: 1422 mL, Rfl: 0 .  ondansetron (ZOFRAN) 8 MG tablet, Take 1  tablet (8 mg total) by mouth 2 (two) times daily as needed. Start on the third day after cisplatin chemotherapy. (Patient taking differently: Take 8 mg by mouth 2 (two) times daily as needed for nausea or vomiting. Start on the third day after cisplatin chemotherapy.), Disp: 30 tablet, Rfl: 1 .  Oxycodone HCl 20 MG TABS, Take 1 tablet (20 mg total) by mouth every 4 (four) hours as needed., Disp: 180 tablet, Rfl: 0 .  prochlorperazine (COMPAZINE) 10 MG tablet, Take 1 tablet (10 mg total) by mouth every 6 (six) hours as needed (Nausea or vomiting)., Disp: 30 tablet, Rfl: 1  Physical exam:  Vitals:   11/08/20 0840  BP: (!) 144/95  Pulse: 75  Resp: 18  Temp: 99.3 F (37.4 C)  TempSrc: Tympanic  SpO2: 100%  Weight: 158 lb 5 oz (71.8 kg)   Physical Exam HENT:     Head: Normocephalic and atraumatic.     Mouth/Throat:     Mouth: Mucous membranes are moist.     Pharynx: Oropharynx is clear.  Eyes:     Extraocular Movements: EOM normal.  Neck:     Comments: Left cervical lymph node appears smaller Cardiovascular:     Rate and Rhythm: Normal rate and regular rhythm.     Heart sounds: Normal heart sounds.  Pulmonary:     Effort: Pulmonary effort is normal.     Breath sounds: Normal breath sounds.  Abdominal:     General: Bowel sounds are normal.     Palpations: Abdomen is soft.  Skin:    General: Skin is warm and dry.  Neurological:     Mental Status: He is alert and oriented to person, place, and time.      CMP Latest Ref Rng & Units 11/01/2020  Glucose 70 - 99 mg/dL 133(H)  BUN 6 - 20 mg/dL 14  Creatinine 0.61 - 1.24 mg/dL 0.90  Sodium 135 - 145 mmol/L 136  Potassium 3.5 - 5.1 mmol/L 3.8  Chloride 98 - 111 mmol/L 99  CO2 22 - 32 mmol/L 26  Calcium 8.9 - 10.3 mg/dL 8.8(L)  Total Protein 6.5 - 8.1 g/dL 7.0  Total Bilirubin 0.3 - 1.2 mg/dL 0.3  Alkaline Phos 38 - 126 U/L 55  AST 15 - 41 U/L 22  ALT 0 - 44 U/L 18   CBC Latest Ref Rng & Units 11/08/2020  WBC 4.0 - 10.5 K/uL  15.5(H)  Hemoglobin 13.0 - 17.0 g/dL 13.1  Hematocrit 39.0 - 52.0 % 35.9(L)  Platelets 150 - 400 K/uL 244    No images are attached to the encounter.  PERIPHERAL VASCULAR CATHETERIZATION  Result Date: 10/25/2020 See op note  NM PET Image Initial (PI) Skull Base To Thigh  Result Date: 10/14/2020 CLINICAL DATA:  Initial treatment strategy for left base of tongue mass with necrotic left neck lymph nodes. 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 EXAM: NUCLEAR MEDICINE PET SKULL BASE TO THIGH TECHNIQUE: 7.9 mCi F-18 FDG was injected intravenously. Full-ring PET imaging was performed from the skull base to thigh after the radiotracer. CT data was obtained and used for attenuation correction and anatomic localization. Fasting blood glucose: 121 mg/dl COMPARISON:  09/27/2020 neck CT. FINDINGS: Mediastinal blood pool activity: SUV max 2.0 Liver activity: SUV max NA NECK: Hypermetabolic left base of tongue mass measuring approximately 2.8 cm with max SUV 10.8 (series 3/image 40). Mildly enlarged centrally necrotic left level 2/3 neck lymph nodes with low level hypermetabolism. Representative 1.2 cm high left level 2 node with max SUV 3.7 (series 3/image 40). Representative 1.2 cm low left level 2/upper level 3 node with max SUV 3.4 (series 3/image 52). No hypermetabolic right neck lymph nodes. No hypermetabolic lateral retropharyngeal nodes. Incidental CT findings: none CHEST: No enlarged or hypermetabolic axillary, mediastinal or hilar lymph nodes. No hypermetabolic pulmonary findings. Incidental CT findings: Mildly atherosclerotic nonaneurysmal thoracic aorta. Mild centrilobular and paraseptal emphysema. Tiny 3 mm dense anterior right lower lobe (series 3/image 130) and 3 mm anterior left lower lobe (series 3/image 106) solid pulmonary nodules, below PET resolution. ABDOMEN/PELVIS: No abnormal hypermetabolic activity within the liver, pancreas, adrenal glands, or spleen. No  hypermetabolic lymph nodes in the abdomen or pelvis. Incidental CT findings: Atherosclerotic nonaneurysmal abdominal aorta. SKELETON: No focal hypermetabolic activity to suggest skeletal metastasis. Incidental CT findings: none IMPRESSION: 1. Hypermetabolic left base of tongue mass compatible with known primary malignancy in this location. 2. Hypermetabolic left level 2/3 neck lymph nodes. 3. No hypermetabolic metastatic disease in the chest, abdomen, pelvis or skeleton. 4. Two tiny 3 mm solid pulmonary nodules, below PET resolution. Recommend follow-up chest  CT in 3 months. 5. Aortic Atherosclerosis (ICD10-I70.0) and Emphysema (ICD10-J43.9). Electronically Signed   By: Ilona Sorrel M.D.   On: 10/14/2020 15:54     Assessment and plan- Patient is a 50 y.o. 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  He is here for on treatment assessment prior to cycle 2 of weekly cisplatin chemotherapy  Counts okay to proceed with cycle 2 of weekly cisplatin chemotherapy today.  He will proceed with cycle 3 next week and I will see him back in 2 weeks for cycle 4.  Neoplasm related pain: Patient is presently on oxycodone 20 mg every 4 hours as needed for pain in addition to fentanyl patch 12 mcg And gabapentin 300 mg 3 times daily.  I have asked him to increase his fentanyl patch to 25 mcg daily.  Continue oxycodone at the same dose.  Also slowly increase gabapentin to 600 mg 3 times daily.  Patient does admit to using marijuana and has agreed to stop taking it altogether.  Denies any illicit drug use  Protein calorie malnutrition: This is due to difficulty swallowing in the setting of oropharyngeal mass.  He is scheduled for a PEG tube tomorrow  Anxiety: Getting worse.  I will increase his Ativan to 0.5 mg every 6 hours as needed   Visit Diagnosis 1. Encounter for antineoplastic chemotherapy   2. Neoplasm related pain   3. Squamous cell carcinoma of  oropharynx ([place])      Dr. Randa Evens, MD, MPH [facility] at [facility] [id number] 11/08/2020 9:04 AM

## 2020-11-08 NOTE — Progress Notes (Signed)
Pt tolerated all infusions well today with no complaints or problems noted.  Pt left infusion suite stable and ambulatory.

## 2020-11-09 ENCOUNTER — Telehealth: Payer: Self-pay | Admitting: Surgery

## 2020-11-09 ENCOUNTER — Ambulatory Visit
Admission: RE | Admit: 2020-11-09 | Discharge: 2020-11-09 | Disposition: A | Discharge status: Home / Self Care | Payer: Medicaid Other | Source: Ambulatory Visit | Attending: Radiation Oncology | Admitting: Radiation Oncology

## 2020-11-09 ENCOUNTER — Encounter: Admission: RE | Payer: Self-pay | Source: Home / Self Care

## 2020-11-09 ENCOUNTER — Inpatient Hospital Stay: Admission: RE | Admit: 2020-11-09 | Payer: Self-pay | Source: Ambulatory Visit

## 2020-11-09 ENCOUNTER — Ambulatory Visit: Admission: RE | Admit: 2020-11-09 | Payer: Self-pay | Source: Home / Self Care | Admitting: Surgery

## 2020-11-09 DIAGNOSIS — Z510 Encounter for antineoplastic radiation therapy: Secondary | ICD-10-CM | POA: Diagnosis not present

## 2020-11-09 SURGERY — INSERTION, GASTROSTOMY TUBE, ROBOT-ASSISTED
Anesthesia: General

## 2020-11-09 MED ORDER — ORAL CARE MOUTH RINSE: 15.0000 mL | Freq: Once | OROMUCOSAL | Status: DC

## 2020-11-09 MED ORDER — CHLORHEXIDINE GLUCONATE CLOTH 2 % EX PADS: 6.0000 | MEDICATED_PAD | Freq: Once | CUTANEOUS | Status: DC

## 2020-11-09 MED ORDER — GABAPENTIN 300 MG PO CAPS: 300.0000 mg | ORAL_CAPSULE | ORAL | Status: DC

## 2020-11-09 MED ORDER — SODIUM CHLORIDE 0.9 % IV SOLN: 2.0000 g | INTRAVENOUS | Status: DC

## 2020-11-09 MED ORDER — CELECOXIB 200 MG PO CAPS: 200.0000 mg | ORAL_CAPSULE | ORAL | Status: DC

## 2020-11-09 MED ORDER — CHLORHEXIDINE GLUCONATE 0.12 % MT SOLN: 15.0000 mL | Freq: Once | OROMUCOSAL | Status: DC

## 2020-11-09 MED ORDER — LACTATED RINGERS IV SOLN: INTRAVENOUS | Status: DC

## 2020-11-09 MED ORDER — ACETAMINOPHEN 500 MG PO TABS: 1000.0000 mg | ORAL_TABLET | ORAL | Status: DC

## 2020-11-09 NOTE — Telephone Encounter (Signed)
Incoming call from the patient.  He saw his oncologist today.  Patient has already gained 10.5 lbs.  Per the patient his oncologist told him that at this time the gastrostomy tube not needed.  Patient wants to cancel his surgery  Dr. Dahlia Byes is informed and at patient request he is taken off the OR board from today and wished well.

## 2020-11-10 ENCOUNTER — Other Ambulatory Visit: Payer: Self-pay | Admitting: *Deleted

## 2020-11-10 ENCOUNTER — Ambulatory Visit
Admission: RE | Admit: 2020-11-10 | Discharge: 2020-11-10 | Disposition: A | Discharge status: Home / Self Care | Payer: Medicaid Other | Source: Ambulatory Visit | Attending: Radiation Oncology | Admitting: Radiation Oncology

## 2020-11-10 DIAGNOSIS — Z510 Encounter for antineoplastic radiation therapy: Secondary | ICD-10-CM | POA: Diagnosis not present

## 2020-11-10 NOTE — Telephone Encounter (Signed)
Patient called reporting that when he lies down at night he feels like his esophagus is closing off and he cannot breath causing a panic attack and him having to get up out of bed and sit in a chair . He also reports that he needs a refill of his anxiety medicine today

## 2020-11-11 ENCOUNTER — Ambulatory Visit: Payer: Medicaid Other

## 2020-11-11 ENCOUNTER — Inpatient Hospital Stay: Payer: Medicaid Other

## 2020-11-11 ENCOUNTER — Telehealth: Payer: Self-pay

## 2020-11-11 MED ORDER — LORAZEPAM 0.5 MG PO TABS: 0.5000 mg | ORAL_TABLET | Freq: Three times a day (TID) | ORAL | 0 refills | Status: DC

## 2020-11-11 NOTE — Telephone Encounter (Signed)
Nutrition  Called patient for nutrition follow-up.  No answer. Left message with call back number  [place] B. Javion Holmer, RD, LDN Registered Dietitian [phone] (mobile)  

## 2020-11-12 ENCOUNTER — Ambulatory Visit
Admission: RE | Admit: 2020-11-12 | Discharge: 2020-11-12 | Disposition: A | Discharge status: Home / Self Care | Payer: Medicaid Other | Source: Ambulatory Visit | Attending: Radiation Oncology | Admitting: Radiation Oncology

## 2020-11-12 DIAGNOSIS — Z510 Encounter for antineoplastic radiation therapy: Secondary | ICD-10-CM | POA: Diagnosis not present

## 2020-11-15 ENCOUNTER — Inpatient Hospital Stay (HOSPITAL_BASED_OUTPATIENT_CLINIC_OR_DEPARTMENT_OTHER): Payer: Medicaid Other | Admitting: Oncology

## 2020-11-15 ENCOUNTER — Inpatient Hospital Stay: Payer: Medicaid Other

## 2020-11-15 ENCOUNTER — Ambulatory Visit
Admission: RE | Admit: 2020-11-15 | Discharge: 2020-11-15 | Disposition: A | Discharge status: Home / Self Care | Payer: Medicaid Other | Source: Ambulatory Visit | Attending: Radiation Oncology | Admitting: Radiation Oncology

## 2020-11-15 ENCOUNTER — Encounter: Payer: Self-pay | Admitting: Oncology

## 2020-11-15 ENCOUNTER — Telehealth: Payer: Self-pay | Admitting: Surgery

## 2020-11-15 ENCOUNTER — Telehealth: Payer: Self-pay | Admitting: *Deleted

## 2020-11-15 ENCOUNTER — Other Ambulatory Visit: Payer: Self-pay | Admitting: *Deleted

## 2020-11-15 VITALS — BP 150/90 | HR 84 | Temp 97.8°F

## 2020-11-15 VITALS — BP 130/97 | HR 90 | Temp 97.6°F | Resp 18 | Wt 145.0 lb

## 2020-11-15 DIAGNOSIS — Z5111 Encounter for antineoplastic chemotherapy: Secondary | ICD-10-CM | POA: Diagnosis not present

## 2020-11-15 DIAGNOSIS — K1230 Oral mucositis (ulcerative), unspecified: Secondary | ICD-10-CM

## 2020-11-15 DIAGNOSIS — F419 Anxiety disorder, unspecified: Secondary | ICD-10-CM

## 2020-11-15 DIAGNOSIS — E860 Dehydration: Secondary | ICD-10-CM

## 2020-11-15 DIAGNOSIS — K1233 Oral mucositis (ulcerative) due to radiation: Secondary | ICD-10-CM

## 2020-11-15 DIAGNOSIS — R634 Abnormal weight loss: Secondary | ICD-10-CM

## 2020-11-15 DIAGNOSIS — Z510 Encounter for antineoplastic radiation therapy: Secondary | ICD-10-CM | POA: Diagnosis not present

## 2020-11-15 DIAGNOSIS — G893 Neoplasm related pain (acute) (chronic): Secondary | ICD-10-CM

## 2020-11-15 DIAGNOSIS — E871 Hypo-osmolality and hyponatremia: Secondary | ICD-10-CM

## 2020-11-15 DIAGNOSIS — C109 Malignant neoplasm of oropharynx, unspecified: Secondary | ICD-10-CM

## 2020-11-15 LAB — CBC WITH DIFFERENTIAL/PLATELET
Abs Immature Granulocytes: 0.04 10*3/uL (ref 0.00–0.07)
Basophils Absolute: 0 10*3/uL (ref 0.0–0.1)
Basophils Relative: 0 %
Eosinophils Absolute: 0 10*3/uL (ref 0.0–0.5)
Eosinophils Relative: 0 %
HCT: 40.7 % (ref 39.0–52.0)
Hemoglobin: 15.2 g/dL (ref 13.0–17.0)
Immature Granulocytes: 0 %
Lymphocytes Relative: 21 %
Lymphs Abs: 1.9 10*3/uL (ref 0.7–4.0)
MCH: 34 pg (ref 26.0–34.0)
MCHC: 37.3 g/dL — ABNORMAL HIGH (ref 30.0–36.0)
MCV: 91.1 fL (ref 80.0–100.0)
Monocytes Absolute: 0.8 10*3/uL (ref 0.1–1.0)
Monocytes Relative: 9 %
Neutro Abs: 6.4 10*3/uL (ref 1.7–7.7)
Neutrophils Relative %: 70 %
Platelets: 260 10*3/uL (ref 150–400)
RBC: 4.47 MIL/uL (ref 4.22–5.81)
RDW: 12.1 % (ref 11.5–15.5)
WBC: 9.2 10*3/uL (ref 4.0–10.5)
nRBC: 0 % (ref 0.0–0.2)

## 2020-11-15 LAB — BASIC METABOLIC PANEL
Anion gap: 8 (ref 5–15)
BUN: 18 mg/dL (ref 6–20)
CO2: 27 mmol/L (ref 22–32)
Calcium: 9 mg/dL (ref 8.9–10.3)
Chloride: 93 mmol/L — ABNORMAL LOW (ref 98–111)
Creatinine, Ser: 0.83 mg/dL (ref 0.61–1.24)
GFR, Estimated: >60 mL/min (ref >60)
Glucose, Bld: 98 mg/dL (ref 70–99)
Potassium: 3.8 mmol/L (ref 3.5–5.1)
Sodium: 128 mmol/L — ABNORMAL LOW (ref 135–145)

## 2020-11-15 LAB — MAGNESIUM: Magnesium: 2 mg/dL (ref 1.7–2.4)

## 2020-11-15 LAB — PHOSPHORUS: Phosphorus: 3.7 mg/dL (ref 2.5–4.6)

## 2020-11-15 MED ORDER — SODIUM CHLORIDE 0.9% FLUSH
10.0000 mL | INTRAVENOUS | Status: DC | PRN
  Administered 2020-11-15: 10 mL via INTRAVENOUS
  Filled 2020-11-15: qty 10

## 2020-11-15 MED ORDER — SODIUM CHLORIDE 0.9 % IV SOLN
Freq: Once | INTRAVENOUS | Status: AC
  Filled 2020-11-15: qty 250

## 2020-11-15 MED ORDER — HEPARIN SOD (PORK) LOCK FLUSH 100 UNIT/ML IV SOLN
500.0000 [IU] | Freq: Once | INTRAVENOUS | Status: AC
  Administered 2020-11-15: 500 [IU] via INTRAVENOUS
  Filled 2020-11-15: qty 5

## 2020-11-15 MED ORDER — LORAZEPAM 0.5 MG PO TABS: 0.5000 mg | ORAL_TABLET | Freq: Three times a day (TID) | ORAL | 0 refills | Status: DC

## 2020-11-15 NOTE — Progress Notes (Signed)
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  Patient receiving chemotherapy and radiation.    Met with patient during IV fluids. Chemotherapy cancelled for today due to mucositis.  Patient can't tolerate taste of magic mouthwash.  He cancelled PEG placement times 2.  Patient can only tolerate chicken broth with cooked egg whites in the soup.  Ensure shakes are too thick due to mucus in back of throat.     Medications: doxepin mouthwash   Labs: Na 128, phosphorus 3.7, mag 2.0  Anthropometrics:   Weight 145 lb today decreased from 158 lb on 11/08/20   Estimated Energy Needs  Kcals: 1980-2300 Protein: 99-115 g Fluid: > 2 L  NUTRITION DIAGNOSIS: Predicted sub optimal energy intake continues     INTERVENTION:  RD has asked patient to let me know when PEG will be reschedule to set back up enteral supplies.  Patient confirms that he has RD's contact information Provided samples of boost soothe (thinner, juice shake and [employer] 1.4 shake) to try.  Provide ensure rapid hydration packets to mix in water.      MONITORING, EVALUATION, GOAL: weight trends, intake   NEXT VISIT: Feb 7th or sooner pending PEG placement  Jonanthan Bolender B. Zenia Resides, [place], [place] Registered Dietitian [phone] (mobile)

## 2020-11-15 NOTE — Telephone Encounter (Signed)
[place] and cancelled the Lorazepam order that was sent on 11/11/20. RN will then send prescription refill to [facility] with [employer].

## 2020-11-15 NOTE — Progress Notes (Signed)
No chemotherapy today. IVF's only d/t dehydration / mucositis. Nutritionist visit while he is receiving fluids as pt has requested a G tube for TF's. Discharged to home. Ambulatory.

## 2020-11-15 NOTE — Telephone Encounter (Signed)
Patient calls back, he states that he wants to proceed with scheduling his surgery.  He thought about it and at this time realizing he needs the gastrostomy tube placed.     Patient has been advised of Pre-Admission date/time, COVID Testing date and Surgery date.  Surgery Date: 11/18/20 Preadmission Testing Date: 11/05/20 (phone already done) Covid Testing Date: 11/16/20 - patient advised to go to the [facility] ([place]) between 8a-1p  Patient has been made aware to call [phone], between 1-3:00pm the day before surgery, to find out what time to arrive for surgery.

## 2020-11-15 NOTE — Progress Notes (Signed)
Lost 13 lbs in 1 week. Having mouth soreness in throat as well. Difficulty eating. Can't tolerate medicated mouthwash- unplalatable. Pt is changing his mind about needing a G tube. Keeps a constant headache. Pt has panic attacks in radiation.

## 2020-11-15 NOTE — Progress Notes (Signed)
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 He continued to have on and off pain which was gradually getting worse and he presented back to the ER on 09/27/2020.   CT soft tissue neck showed a left base of tongue mass measuring 3.4 x 2.6 x 3.3 cm which was invading the tongue along the anterior and medial margin and extending into the left vallecula. Possible superior and posterior extension to involve the left palatine tonsil the lesion abuts the uvula with early extension into the soft palate. Multiple left level 2 lymph nodes largest of which was 1.2 cm. Additional necrotic limited to level 3 nodal conglomerate measures 1.3 cm. No right-sided adenopathy.  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 PET CT scan Showed left base of tongue mass measuring about 2.8 cm with enlarged centrally necrotic level 2/3 left-sided and lymph nodes with an SUV about 3.7. No hypermetabolic right neck nodes. No findings of distant metastatic disease Plan is for concurrent weekly cisplatin with radiation treatment   Interval history-patient canceled his PEG tube appointment the second time with Dr. Dahlia Byes and now he regrets his decision.  His mucositis is getting worse and he is unable to eat much and has lost 13 pounds in  the last 1 week.  Continues to have significant throat pain for which he uses fentanyl patch, as needed oxycodone.  He has been unable to use Magic mouthwash as he does not like the taste of it and would like to try something different  ECOG PS- 1 Pain scale- 8 Opioid associated constipation- no  Review of systems- Review of Systems  Constitutional: Positive for malaise/fatigue. Negative for chills, fever and weight loss.  HENT: Negative for congestion, ear discharge and nosebleeds.        Throat pain  Eyes: Negative for blurred vision.  Respiratory: Negative for cough, hemoptysis, sputum production, shortness of breath and wheezing.   Cardiovascular: Negative for chest pain, palpitations, orthopnea and claudication.  Gastrointestinal: Negative for abdominal pain, blood in stool, constipation, diarrhea, heartburn, melena, nausea and vomiting.  Genitourinary: Negative for dysuria, flank pain, frequency, hematuria and urgency.  Musculoskeletal: Negative for back pain, joint pain and myalgias.  Skin: Negative for rash.  Neurological: Negative for dizziness, tingling, focal weakness, seizures, weakness and headaches.  Endo/Heme/Allergies: Does not bruise/bleed easily.  Psychiatric/Behavioral: Negative for depression and suicidal ideas. The patient does not have insomnia.       No Known Allergies   Past Medical History:  Diagnosis Date  . 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 PORTA CATH INSERTION N/A 10/25/2020   Procedure: PORTA CATH INSERTION;  Surgeon: Algernon Huxley, MD;  Location: [place] [facility];  Service: Cardiovascular;  Laterality: N/A;    Social History   Socioeconomic History  . Marital status:  Single    Spouse name: Not on file  . Number of children: Not on file  . Years of education: Not on file  . Highest education level: Not on file  Occupational History  . Not on file  Tobacco Use  . Smoking status: Current Every  Day Smoker    Packs/day: 1.00  . Smokeless tobacco: Never Used  Vaping Use  . Vaping Use: Never used  Substance and Sexual Activity  . Alcohol use: Yes    Comment: rare   . Drug use: Not on file  . Sexual activity: Not on file  Other Topics Concern  . Not on file  Social History Narrative  . Not on file   Social Determinants of Health   Financial Resource Strain: Not on file  Food Insecurity: Not on file  Transportation Needs: Not on file  Physical Activity: Not on file  Stress: Not on file  Social Connections: Not on file  Intimate Partner Violence: Not on file    Family History  Problem Relation Age of Onset  . Cancer Father      Current Outpatient Medications:  .  dexamethasone (DECADRON) 4 MG tablet, Take 2 tablets (8 mg total) by mouth daily. Take daily x 3 days starting the day after cisplatin chemotherapy. Take with food. (Patient taking differently: Take 8 mg by mouth See admin instructions. Take 8 mg daily x 3 days starting the day after cisplatin chemotherapy. Take with food.), Disp: 30 tablet, Rfl: 1 .  fentaNYL (DURAGESIC) 12 MCG/HR, Place 1 patch onto the skin every 3 (three) days., Disp: 10 patch, Rfl: 0 .  gabapentin (NEURONTIN) 300 MG capsule, Take 2 capsules (600 mg total) by mouth 3 (three) times daily., Disp: 90 capsule, Rfl: 2 .  lidocaine-prilocaine (EMLA) cream, Apply to affected area once (Patient taking differently: Apply 1 application topically daily as needed (port access).), Disp: 30 g, Rfl: 3 .  LORazepam (ATIVAN) 0.5 MG tablet, Take 1 tablet (0.5 mg total) by mouth every 8 (eight) hours., Disp: 60 tablet, Rfl: 0 .  Nutritional Supplements (FEEDING SUPPLEMENT, OSMOLITE 1.5 CAL,) LIQD, Give 1 1/2 cartons of formula 4 times per day via feeding tube (8am, noon, 4pm and 8pm). Flush with 59ml of water before and after each feeding. Drink or give additional 252ml water TID between feedings via tube for better hydration., Disp: 1422 mL, Rfl: 0 .   ondansetron (ZOFRAN) 8 MG tablet, Take 1 tablet (8 mg total) by mouth 2 (two) times daily as needed. Start on the third day after cisplatin chemotherapy. (Patient taking differently: Take 8 mg by mouth 2 (two) times daily as needed for nausea or vomiting. Start on the third day after cisplatin chemotherapy.), Disp: 30 tablet, Rfl: 1 .  Oxycodone HCl 20 MG TABS, Take 1 tablet (20 mg total) by mouth every 4 (four) hours as needed., Disp: 180 tablet, Rfl: 0 .  prochlorperazine (COMPAZINE) 10 MG tablet, Take 1 tablet (10 mg total) by mouth every 6 (six) hours as needed (Nausea or vomiting)., Disp: 30 tablet, Rfl: 1 No current facility-administered medications for this visit.  Facility-Administered Medications Ordered in Other Visits:  .  heparin lock flush 100 unit/mL, 500 Units, Intravenous, Once, Randa Evens C, MD .  sodium chloride flush (NS) 0.9 % injection 10 mL, 10 mL, Intravenous, PRN, Sindy Guadeloupe, MD, 10 mL at 11/15/20 0815  Physical exam:  Vitals:   11/15/20 0906  BP: (!) 130/97  Pulse: 90  Resp: 18  Temp: 97.6 F (36.4 C)  TempSrc: Tympanic  SpO2: 100%  Weight: 145 lb (65.8 kg)   Physical Exam HENT:     Mouth/Throat:     Comments: Grade 2 radiation mucositis noted Eyes:     Extraocular Movements: EOM normal.  Cardiovascular:     Rate and Rhythm: Normal rate and regular rhythm.     Heart sounds: Normal heart sounds.  Pulmonary:     Effort: Pulmonary effort is normal.     Breath sounds: Normal breath sounds.  Abdominal:     General: Bowel sounds are normal.     Palpations: Abdomen is soft.  Skin:    General: Skin is warm and dry.  Neurological:     Mental Status: He is alert and oriented to person, place, and time.      CMP Latest Ref Rng & Units 11/08/2020  Glucose 70 - 99 mg/dL 115(H)  BUN 6 - 20 mg/dL 24(H)  Creatinine 0.61 - 1.24 mg/dL 0.73  Sodium 135 - 145 mmol/L 131(L)  Potassium 3.5 - 5.1 mmol/L 4.6  Chloride 98 - 111 mmol/L 92(L)  CO2 22 - 32 mmol/L  31  Calcium 8.9 - 10.3 mg/dL 9.1  Total Protein 6.5 - 8.1 g/dL -  Total Bilirubin 0.3 - 1.2 mg/dL -  Alkaline Phos 38 - 126 U/L -  AST 15 - 41 U/L -  ALT 0 - 44 U/L -   CBC Latest Ref Rng & Units 11/15/2020  WBC 4.0 - 10.5 K/uL 9.2  Hemoglobin 13.0 - 17.0 g/dL 15.2  Hematocrit 39.0 - 52.0 % 40.7  Platelets 150 - 400 K/uL 260    No images are attached to the encounter.  PERIPHERAL VASCULAR CATHETERIZATION  Result Date: 10/25/2020 See op note    Assessment and plan- Patient is a 50 y.o. male  with stage IHPV positive squamous cell carcinoma of the oropharynx/left base of tongue T2 N1 M0.   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 Patient has lost 13 pounds in the last week as well.  I did get in touch with Dr. Donella Stade and I plan to hold chemotherapy today.  Dr. Donella Stade will assess him tomorrow and may give him a break from radiation this week as well.  I will send a prescription for doxepin mouthwash as well since patient is not able to swallow Magic mouthwash because of its taste  Neoplasm related pain: Continue present dose of fentanyl patch, oxycodone and gabapentin.  Patient knows that he should only take pain medicine as prescribed as I will not be able to refill his medications ahead of time  Anxiety: We will res[facility] the [facility]  Abnormal weight loss: Patient has canceled his PEG tube appointment twice with Dr. Dahlia Byes and now regrets his decision.  He does want a feeding tube especially given his weight loss of 13 pounds in the last 1 week.  I have asked the patient to get in touch with Dr. Corlis Leak office himself at this time.  We have done that for him in the past twice.  Patient understands and will call himself  Hyponatremia: We will give him 1 L of IV fluids today  I will see him in 1 week to restart  chemotherapy   Visit Diagnosis 1. Encounter for antineoplastic chemotherapy   2. Squamous cell carcinoma of  oropharynx ([place])      Dr. Randa Evens, MD, MPH [facility] [id number] 11/15/2020 10:08 AM

## 2020-11-16 ENCOUNTER — Ambulatory Visit: Admission: RE | Admit: 2020-11-16 | Payer: Medicaid Other | Source: Ambulatory Visit

## 2020-11-16 ENCOUNTER — Other Ambulatory Visit: Payer: Self-pay

## 2020-11-16 ENCOUNTER — Ambulatory Visit: Payer: Medicaid Other

## 2020-11-16 ENCOUNTER — Other Ambulatory Visit
Admission: RE | Admit: 2020-11-16 | Discharge: 2020-11-16 | Disposition: A | Discharge status: Home / Self Care | Payer: Medicaid Other | Source: Ambulatory Visit | Attending: Surgery | Admitting: Surgery

## 2020-11-16 ENCOUNTER — Telehealth: Payer: Self-pay | Admitting: *Deleted

## 2020-11-16 ENCOUNTER — Other Ambulatory Visit: Payer: Self-pay | Admitting: Licensed Clinical Social Worker

## 2020-11-16 DIAGNOSIS — C109 Malignant neoplasm of oropharynx, unspecified: Secondary | ICD-10-CM

## 2020-11-16 DIAGNOSIS — Z01812 Encounter for preprocedural laboratory examination: Secondary | ICD-10-CM | POA: Insufficient documentation

## 2020-11-16 DIAGNOSIS — U071 COVID-19: Secondary | ICD-10-CM | POA: Diagnosis not present

## 2020-11-16 LAB — SARS CORONAVIRUS 2 (TAT 6-24 HRS): SARS Coronavirus 2: POSITIVE — AB

## 2020-11-16 MED ORDER — DEXAMETHASONE 4 MG PO TABS: 4.0000 mg | ORAL_TABLET | Freq: Two times a day (BID) | ORAL | 1 refills | Status: DC

## 2020-11-16 NOTE — Telephone Encounter (Signed)
[facility] to see if they can compound doxepin. Judeen Hammans said that they would have to call [employer] and see what is all the ingredients in it to make it. She is not here tomorrow and will leave this for Kennyth Lose when she comes in. I told her I will check again tomorrow. The pt. Does not have insurance to pay for the med and we have been using [employer] for his cost of meds but we do not have an account with [employer].

## 2020-11-17 ENCOUNTER — Telehealth: Payer: Self-pay

## 2020-11-17 ENCOUNTER — Inpatient Hospital Stay
Admission: RE | Admit: 2020-11-17 | Discharge: 2020-11-17 | Disposition: A | Discharge status: Home / Self Care | Payer: Medicaid Other | Source: Ambulatory Visit

## 2020-11-17 ENCOUNTER — Ambulatory Visit: Payer: Medicaid Other

## 2020-11-17 ENCOUNTER — Telehealth: Payer: Self-pay | Admitting: Family

## 2020-11-17 ENCOUNTER — Telehealth: Payer: Self-pay | Admitting: Surgery

## 2020-11-17 NOTE — Telephone Encounter (Signed)
Called to discuss with patient about COVID-19 symptoms and the use of one of the available treatments for those with mild to moderate Covid symptoms and at a high risk of hospitalization.  Pt appears to qualify for outpatient treatment due to co-morbid conditions and/or a member of an at-risk group in accordance with the FDA Emergency Use Authorization.    Symptom onset: Pt. Is unsure "because I'm getting chemo and radiation so I'm not sure." Vaccinated:  X 1 Booster? No Immunocompromised? Yes Qualifiers: Cancer  Discussed with patient via phone. I have asked them to expect a call from APP to discuss treatment options. Moved to pre-screened list.   Paul Hebert

## 2020-11-17 NOTE — Telephone Encounter (Signed)
Pt aware of covid results and will need to r/s his procedure. Verbalizes understanding.

## 2020-11-17 NOTE — Telephone Encounter (Signed)
Called to discuss with patient about COVID-19 symptoms and the use of one of the available treatments for those with mild to moderate Covid symptoms and at a high risk of hospitalization.  Pt appears to qualify for outpatient treatment due to co-morbid conditions and/or a member of an at-risk group in accordance with the FDA Emergency Use Authorization.   Symptom onset:  Vaccinated:  X 1 Booster? No Immunocompromised? Yes Qualifiers: Cancer  Mr. Paul Hebert was unable to speak at this time and would like a call back after 4pm today.   Terri Piedra, NP 11/17/2020 10:37 AM

## 2020-11-17 NOTE — Telephone Encounter (Signed)
Updated information regarding rescheduled surgery.  Patient tested positive for his Covid test on 11/16/20.  Per Dr. Dahlia Byes we will reschedule on Dr. Corlis Leak flex day, Feb. 14th as patient will need to get this done asap.  Patient has been advised of Pre-Admission date/time, COVID Testing date and Surgery date.  Surgery Date: 11/29/20 Preadmission Testing Date: 11/05/20 (phone already done) Covid Testing Date: 11/16/20 - patient advised to go to the [facility] between 8a-1p  Patient has been made aware to call [phone], between 1-3:00pm the day before surgery, to find out what time to arrive for surgery.

## 2020-11-18 ENCOUNTER — Ambulatory Visit: Payer: Medicaid Other

## 2020-11-18 ENCOUNTER — Telehealth: Payer: Self-pay | Admitting: *Deleted

## 2020-11-18 ENCOUNTER — Other Ambulatory Visit: Payer: Self-pay | Admitting: *Deleted

## 2020-11-18 MED ORDER — FENTANYL 25 MCG/HR TD PT72: 1.0000 | MEDICATED_PATCH | TRANSDERMAL | 0 refills | Status: DC

## 2020-11-18 NOTE — Telephone Encounter (Signed)
Patient requests refill for Duragesic patch. Patient states that after visit with Dr. Janese Banks last month she advised him to increase his dose by doubling patches every three days.

## 2020-11-18 NOTE — Telephone Encounter (Signed)
I called and spoke to pt and let him know that we [facility] and also spoke to him about the other mouthwash to use. It I sonly made at [employer] in [place]. It takes a few days to make it and I have asked the staff to call pt when it is ready and it will be paid for. Pa. Agreeable to this.

## 2020-11-18 NOTE — Telephone Encounter (Signed)
Called the pt and let him know that we have scheduled him for 3 weeks out which is 2/21. Arrival at cancer center 8 am for port labs, then radiation , followed by seeing Janese Banks and then chemo. I have asked Joli the nutrition person to see pt while in chemo. Pt agreeable to this.

## 2020-11-19 ENCOUNTER — Ambulatory Visit: Payer: Medicaid Other

## 2020-11-22 ENCOUNTER — Inpatient Hospital Stay: Payer: Medicaid Other | Admitting: Oncology

## 2020-11-22 ENCOUNTER — Inpatient Hospital Stay: Payer: Medicaid Other

## 2020-11-22 ENCOUNTER — Ambulatory Visit: Payer: Medicaid Other

## 2020-11-23 ENCOUNTER — Ambulatory Visit: Payer: Medicaid Other

## 2020-11-24 ENCOUNTER — Ambulatory Visit: Payer: Medicaid Other

## 2020-11-25 ENCOUNTER — Ambulatory Visit: Payer: Medicaid Other

## 2020-11-26 ENCOUNTER — Ambulatory Visit: Payer: Medicaid Other

## 2020-11-26 NOTE — Addendum Note (Signed)
Addended by: Caroleen Hamman F on: 11/26/2020 03:51 PM   Modules accepted: Orders, SmartSet

## 2020-11-29 ENCOUNTER — Ambulatory Visit: Payer: Medicaid Other

## 2020-11-29 ENCOUNTER — Other Ambulatory Visit: Payer: Self-pay

## 2020-11-29 ENCOUNTER — Encounter: Payer: Self-pay | Admitting: Surgery

## 2020-11-29 ENCOUNTER — Ambulatory Visit: Payer: Self-pay | Admitting: Oncology

## 2020-11-29 ENCOUNTER — Encounter
Admission: RE | Disposition: A | Discharge status: Home / Self Care | Payer: Self-pay | Source: Home / Self Care | Attending: Surgery

## 2020-11-29 ENCOUNTER — Ambulatory Visit
Admission: RE | Admit: 2020-11-29 | Discharge: 2020-11-29 | Disposition: A | Discharge status: Home / Self Care | Payer: Medicaid Other | Attending: Surgery | Admitting: Surgery

## 2020-11-29 ENCOUNTER — Telehealth: Payer: Self-pay

## 2020-11-29 ENCOUNTER — Ambulatory Visit: Payer: Self-pay

## 2020-11-29 DIAGNOSIS — E46 Unspecified protein-calorie malnutrition: Secondary | ICD-10-CM | POA: Insufficient documentation

## 2020-11-29 DIAGNOSIS — F17210 Nicotine dependence, cigarettes, uncomplicated: Secondary | ICD-10-CM | POA: Insufficient documentation

## 2020-11-29 DIAGNOSIS — K7460 Unspecified cirrhosis of liver: Secondary | ICD-10-CM | POA: Insufficient documentation

## 2020-11-29 DIAGNOSIS — Z79899 Other long term (current) drug therapy: Secondary | ICD-10-CM | POA: Diagnosis not present

## 2020-11-29 DIAGNOSIS — C109 Malignant neoplasm of oropharynx, unspecified: Secondary | ICD-10-CM

## 2020-11-29 DIAGNOSIS — Z7952 Long term (current) use of systemic steroids: Secondary | ICD-10-CM | POA: Diagnosis not present

## 2020-11-29 DIAGNOSIS — R1310 Dysphagia, unspecified: Secondary | ICD-10-CM | POA: Insufficient documentation

## 2020-11-29 LAB — URINE DRUG SCREEN, QUALITATIVE (ARMC ONLY)
Amphetamines, Ur Screen: NOT DETECTED
Barbiturates, Ur Screen: NOT DETECTED
Benzodiazepine, Ur Scrn: NOT DETECTED
Cannabinoid 50 Ng, Ur ~~LOC~~: POSITIVE — AB
Cocaine Metabolite,Ur ~~LOC~~: NOT DETECTED
MDMA (Ecstasy)Ur Screen: NOT DETECTED
Methadone Scn, Ur: NOT DETECTED
Opiate, Ur Screen: NOT DETECTED
Phencyclidine (PCP) Ur S: NOT DETECTED
Tricyclic, Ur Screen: NOT DETECTED

## 2020-11-29 LAB — COMPREHENSIVE METABOLIC PANEL
ALT: 15 U/L (ref 0–44)
AST: 20 U/L (ref 15–41)
Albumin: 3.7 g/dL (ref 3.5–5.0)
Alkaline Phosphatase: 55 U/L (ref 38–126)
Anion gap: 9 (ref 5–15)
BUN: 13 mg/dL (ref 6–20)
CO2: 29 mmol/L (ref 22–32)
Calcium: 9 mg/dL (ref 8.9–10.3)
Chloride: 98 mmol/L (ref 98–111)
Creatinine, Ser: 0.93 mg/dL (ref 0.61–1.24)
GFR, Estimated: >60 mL/min (ref >60)
Glucose, Bld: 104 mg/dL — ABNORMAL HIGH (ref 70–99)
Potassium: 4.2 mmol/L (ref 3.5–5.1)
Sodium: 136 mmol/L (ref 135–145)
Total Bilirubin: 0.7 mg/dL (ref 0.3–1.2)
Total Protein: 6.5 g/dL (ref 6.5–8.1)

## 2020-11-29 SURGERY — INSERTION, GASTROSTOMY TUBE, ROBOT-ASSISTED
Anesthesia: General | Site: Abdomen

## 2020-11-29 MED ORDER — GABAPENTIN 300 MG PO CAPS
ORAL_CAPSULE | ORAL | Status: AC
  Administered 2020-11-29: 300 mg via ORAL
  Filled 2020-11-29: qty 1

## 2020-11-29 MED ORDER — CHLORHEXIDINE GLUCONATE 0.12 % MT SOLN: 15.0000 mL | Freq: Once | OROMUCOSAL | Status: AC

## 2020-11-29 MED ORDER — ACETAMINOPHEN 500 MG PO TABS
ORAL_TABLET | ORAL | Status: AC
  Administered 2020-11-29: 1000 mg via ORAL
  Filled 2020-11-29: qty 2

## 2020-11-29 MED ORDER — BUPIVACAINE LIPOSOME 1.3 % IJ SUSP
INTRAMUSCULAR | Status: DC | PRN
  Administered 2020-11-29: 20 mL

## 2020-11-29 MED ORDER — BUPIVACAINE-EPINEPHRINE (PF) 0.25% -1:200000 IJ SOLN
INTRAMUSCULAR | Status: AC
  Filled 2020-11-29: qty 30

## 2020-11-29 MED ORDER — ORAL CARE MOUTH RINSE: 15.0000 mL | Freq: Once | OROMUCOSAL | Status: AC

## 2020-11-29 MED ORDER — HYDROMORPHONE HCL 1 MG/ML IJ SOLN
INTRAMUSCULAR | Status: AC
  Administered 2020-11-29: 0.5 mg via INTRAVENOUS
  Filled 2020-11-29: qty 1

## 2020-11-29 MED ORDER — GABAPENTIN 300 MG PO CAPS: 300.0000 mg | ORAL_CAPSULE | ORAL | Status: AC

## 2020-11-29 MED ORDER — BUPIVACAINE-EPINEPHRINE 0.25% -1:200000 IJ SOLN
INTRAMUSCULAR | Status: DC | PRN
  Administered 2020-11-29: 30 mL

## 2020-11-29 MED ORDER — OXYCODONE-ACETAMINOPHEN 10-325 MG PO TABS: 1.0000 | ORAL_TABLET | ORAL | 0 refills | Status: DC | PRN

## 2020-11-29 MED ORDER — DEXAMETHASONE SODIUM PHOSPHATE 10 MG/ML IJ SOLN
INTRAMUSCULAR | Status: DC | PRN
  Administered 2020-11-29: 10 mg via INTRAVENOUS

## 2020-11-29 MED ORDER — FENTANYL CITRATE (PF) 100 MCG/2ML IJ SOLN
INTRAMUSCULAR | Status: DC | PRN
  Administered 2020-11-29 (×3): 50 ug via INTRAVENOUS

## 2020-11-29 MED ORDER — OXYCODONE HCL 5 MG PO TABS: 5.0000 mg | ORAL_TABLET | Freq: Once | ORAL | Status: DC | PRN

## 2020-11-29 MED ORDER — CHLORHEXIDINE GLUCONATE CLOTH 2 % EX PADS
6.0000 | MEDICATED_PAD | Freq: Once | CUTANEOUS | Status: AC
  Administered 2020-11-29: 6 via TOPICAL

## 2020-11-29 MED ORDER — FAMOTIDINE 20 MG PO TABS: 20.0000 mg | ORAL_TABLET | Freq: Once | ORAL | Status: AC

## 2020-11-29 MED ORDER — LIDOCAINE HCL (PF) 2 % IJ SOLN
INTRAMUSCULAR | Status: AC
  Filled 2020-11-29: qty 5

## 2020-11-29 MED ORDER — PROMETHAZINE HCL 25 MG/ML IJ SOLN: 6.2500 mg | INTRAMUSCULAR | Status: DC | PRN

## 2020-11-29 MED ORDER — MEPERIDINE HCL 50 MG/ML IJ SOLN: 6.2500 mg | INTRAMUSCULAR | Status: DC | PRN

## 2020-11-29 MED ORDER — SUGAMMADEX SODIUM 200 MG/2ML IV SOLN
INTRAVENOUS | Status: DC | PRN
  Administered 2020-11-29: 130 mg via INTRAVENOUS

## 2020-11-29 MED ORDER — DROPERIDOL 2.5 MG/ML IJ SOLN
0.6250 mg | Freq: Once | INTRAMUSCULAR | Status: DC | PRN
  Filled 2020-11-29: qty 2

## 2020-11-29 MED ORDER — CELECOXIB 200 MG PO CAPS
ORAL_CAPSULE | ORAL | Status: AC
  Administered 2020-11-29: 200 mg via ORAL
  Filled 2020-11-29: qty 1

## 2020-11-29 MED ORDER — OXYCODONE HCL 5 MG/5ML PO SOLN: 5.0000 mg | Freq: Once | ORAL | Status: DC | PRN

## 2020-11-29 MED ORDER — LIDOCAINE HCL (CARDIAC) PF 100 MG/5ML IV SOSY
PREFILLED_SYRINGE | INTRAVENOUS | Status: DC | PRN
  Administered 2020-11-29: 100 mg via INTRAVENOUS

## 2020-11-29 MED ORDER — FENTANYL CITRATE (PF) 100 MCG/2ML IJ SOLN
INTRAMUSCULAR | Status: AC
  Filled 2020-11-29: qty 2

## 2020-11-29 MED ORDER — MIDAZOLAM HCL 2 MG/2ML IJ SOLN
INTRAMUSCULAR | Status: DC | PRN
  Administered 2020-11-29: 2 mg via INTRAVENOUS

## 2020-11-29 MED ORDER — CHLORHEXIDINE GLUCONATE 0.12 % MT SOLN
OROMUCOSAL | Status: AC
  Administered 2020-11-29: 15 mL via OROMUCOSAL
  Filled 2020-11-29: qty 15

## 2020-11-29 MED ORDER — FAMOTIDINE 20 MG PO TABS
ORAL_TABLET | ORAL | Status: AC
  Administered 2020-11-29: 20 mg via ORAL
  Filled 2020-11-29: qty 1

## 2020-11-29 MED ORDER — DEXMEDETOMIDINE (PRECEDEX) IN NS 20 MCG/5ML (4 MCG/ML) IV SYRINGE
PREFILLED_SYRINGE | INTRAVENOUS | Status: DC | PRN
  Administered 2020-11-29 (×2): 8 ug via INTRAVENOUS
  Administered 2020-11-29: 4 ug via INTRAVENOUS

## 2020-11-29 MED ORDER — ONDANSETRON HCL 4 MG/2ML IJ SOLN
INTRAMUSCULAR | Status: DC | PRN
  Administered 2020-11-29: 4 mg via INTRAVENOUS

## 2020-11-29 MED ORDER — SODIUM CHLORIDE 0.9 % IV SOLN
INTRAVENOUS | Status: DC | PRN
  Administered 2020-11-29: 40 ug/min via INTRAVENOUS

## 2020-11-29 MED ORDER — SODIUM CHLORIDE 0.9 % IV SOLN
INTRAVENOUS | Status: AC
  Filled 2020-11-29: qty 2

## 2020-11-29 MED ORDER — PROPOFOL 10 MG/ML IV BOLUS
INTRAVENOUS | Status: AC
  Filled 2020-11-29: qty 20

## 2020-11-29 MED ORDER — ROCURONIUM BROMIDE 10 MG/ML (PF) SYRINGE
PREFILLED_SYRINGE | INTRAVENOUS | Status: AC
  Filled 2020-11-29: qty 10

## 2020-11-29 MED ORDER — GLYCOPYRROLATE 0.2 MG/ML IJ SOLN
INTRAMUSCULAR | Status: DC | PRN
  Administered 2020-11-29: .2 mg via INTRAVENOUS

## 2020-11-29 MED ORDER — ONDANSETRON HCL 4 MG/2ML IJ SOLN
INTRAMUSCULAR | Status: AC
  Filled 2020-11-29: qty 2

## 2020-11-29 MED ORDER — HYDROMORPHONE HCL 1 MG/ML IJ SOLN
0.2500 mg | INTRAMUSCULAR | Status: DC | PRN
  Administered 2020-11-29 (×2): 0.5 mg via INTRAVENOUS

## 2020-11-29 MED ORDER — SUCCINYLCHOLINE CHLORIDE 20 MG/ML IJ SOLN
INTRAMUSCULAR | Status: DC | PRN
  Administered 2020-11-29: 140 mg via INTRAVENOUS

## 2020-11-29 MED ORDER — SODIUM CHLORIDE 0.9 % IV SOLN
2.0000 g | INTRAVENOUS | Status: AC
  Administered 2020-11-29: 2 g via INTRAVENOUS

## 2020-11-29 MED ORDER — CELECOXIB 200 MG PO CAPS: 200.0000 mg | ORAL_CAPSULE | ORAL | Status: AC

## 2020-11-29 MED ORDER — DEXAMETHASONE SODIUM PHOSPHATE 10 MG/ML IJ SOLN
INTRAMUSCULAR | Status: AC
  Filled 2020-11-29: qty 1

## 2020-11-29 MED ORDER — DEXMEDETOMIDINE (PRECEDEX) IN NS 20 MCG/5ML (4 MCG/ML) IV SYRINGE
PREFILLED_SYRINGE | INTRAVENOUS | Status: AC
  Filled 2020-11-29: qty 5

## 2020-11-29 MED ORDER — SODIUM CHLORIDE 0.9 % IV SOLN
INTRAVENOUS | Status: DC | PRN
  Administered 2020-11-29 (×2): 100 ug via INTRAVENOUS
  Administered 2020-11-29: 200 ug via INTRAVENOUS
  Administered 2020-11-29 (×3): 100 ug via INTRAVENOUS

## 2020-11-29 MED ORDER — ROCURONIUM BROMIDE 100 MG/10ML IV SOLN
INTRAVENOUS | Status: DC | PRN
  Administered 2020-11-29 (×3): 10 mg via INTRAVENOUS
  Administered 2020-11-29: 20 mg via INTRAVENOUS

## 2020-11-29 MED ORDER — BUPIVACAINE LIPOSOME 1.3 % IJ SUSP
INTRAMUSCULAR | Status: AC
  Filled 2020-11-29: qty 20

## 2020-11-29 MED ORDER — SUCCINYLCHOLINE CHLORIDE 200 MG/10ML IV SOSY
PREFILLED_SYRINGE | INTRAVENOUS | Status: AC
  Filled 2020-11-29: qty 10

## 2020-11-29 MED ORDER — LORAZEPAM 2 MG/ML IJ SOLN: 1.0000 mg | Freq: Once | INTRAMUSCULAR | Status: DC | PRN

## 2020-11-29 MED ORDER — ACETAMINOPHEN 500 MG PO TABS: 1000.0000 mg | ORAL_TABLET | ORAL | Status: AC

## 2020-11-29 MED ORDER — MIDAZOLAM HCL 2 MG/2ML IJ SOLN
INTRAMUSCULAR | Status: AC
  Filled 2020-11-29: qty 2

## 2020-11-29 MED ORDER — PROPOFOL 10 MG/ML IV BOLUS
INTRAVENOUS | Status: DC | PRN
  Administered 2020-11-29: 170 mg via INTRAVENOUS

## 2020-11-29 MED ORDER — LACTATED RINGERS IV SOLN: INTRAVENOUS | Status: DC

## 2020-11-29 SURGICAL SUPPLY — 65 items
ADH SKN CLS APL DERMABOND .7 (GAUZE/BANDAGES/DRESSINGS) ×1
APL PRP STRL LF DISP 70% ISPRP (MISCELLANEOUS) ×1
CANISTER SUCT 1200ML W/VALVE (MISCELLANEOUS) ×2 IMPLANT
CANNULA REDUC XI 12-8 STAPL (CANNULA)
CANNULA REDUCER 12-8 DVNC XI (CANNULA) IMPLANT
CHLORAPREP W/TINT 26 (MISCELLANEOUS) ×2 IMPLANT
COVER TIP SHEARS 8 DVNC (MISCELLANEOUS) ×1 IMPLANT
COVER TIP SHEARS 8MM DA VINCI (MISCELLANEOUS) ×1
COVER WAND RF STERILE (DRAPES) ×4 IMPLANT
DEFOGGER SCOPE WARMER CLEARIFY (MISCELLANEOUS) ×2 IMPLANT
DERMABOND ADVANCED (GAUZE/BANDAGES/DRESSINGS) ×1
DERMABOND ADVANCED .7 DNX12 (GAUZE/BANDAGES/DRESSINGS) ×1 IMPLANT
DRAPE ARM DVNC X/XI (DISPOSABLE) ×4 IMPLANT
DRAPE COLUMN DVNC XI (DISPOSABLE) ×1 IMPLANT
DRAPE DA VINCI XI ARM (DISPOSABLE) ×4
DRAPE DA VINCI XI COLUMN (DISPOSABLE) ×1
ELECT CAUTERY BLADE 6.4 (BLADE) ×2 IMPLANT
ELECT REM PT RETURN 9FT ADLT (ELECTROSURGICAL) ×2
ELECTRODE REM PT RTRN 9FT ADLT (ELECTROSURGICAL) ×1 IMPLANT
GLOVE SURG ENC MOIS LTX SZ7 (GLOVE) ×4 IMPLANT
GOWN STRL REUS W/ TWL LRG LVL3 (GOWN DISPOSABLE) ×3 IMPLANT
GOWN STRL REUS W/TWL LRG LVL3 (GOWN DISPOSABLE) ×6
IRRIGATION STRYKERFLOW (MISCELLANEOUS) IMPLANT
IRRIGATOR STRYKERFLOW (MISCELLANEOUS)
KIT PINK PAD W/HEAD ARE REST (MISCELLANEOUS) ×2
KIT PINK PAD W/HEAD ARM REST (MISCELLANEOUS) ×1 IMPLANT
LABEL OR SOLS (LABEL) ×2 IMPLANT
MANIFOLD NEPTUNE II (INSTRUMENTS) ×2 IMPLANT
NEEDLE HYPO 22GX1.5 SAFETY (NEEDLE) ×2 IMPLANT
NEEDLE INSUFFLATION 14GA 120MM (NEEDLE) ×2 IMPLANT
OBTURATOR OPTICAL STANDARD 8MM (TROCAR) ×1
OBTURATOR OPTICAL STND 8 DVNC (TROCAR) ×1
OBTURATOR OPTICALSTD 8 DVNC (TROCAR) ×1 IMPLANT
PACK LAP CHOLECYSTECTOMY (MISCELLANEOUS) ×2 IMPLANT
PENCIL ELECTRO HAND CTR (MISCELLANEOUS) ×2 IMPLANT
SEAL CANN UNIV 5-8 DVNC XI (MISCELLANEOUS) ×3 IMPLANT
SEAL XI 5MM-8MM UNIVERSAL (MISCELLANEOUS) ×3
SEALER VESSEL DA VINCI XI (MISCELLANEOUS)
SEALER VESSEL EXT DVNC XI (MISCELLANEOUS) IMPLANT
SET TUBE SMOKE EVAC HIGH FLOW (TUBING) ×2 IMPLANT
SOLUTION ELECTROLUBE (MISCELLANEOUS) ×2 IMPLANT
SPONGE DRAIN TRACH 4X4 STRL 2S (GAUZE/BANDAGES/DRESSINGS) ×2 IMPLANT
SPONGE LAP 18X18 RF (DISPOSABLE) ×2 IMPLANT
SPONGE LAP 4X18 RFD (DISPOSABLE) ×2 IMPLANT
STAPLER CANNULA SEAL DVNC XI (STAPLE) ×1 IMPLANT
STAPLER CANNULA SEAL XI (STAPLE) ×1
SUT ETHILON 3-0 FS-10 30 BLK (SUTURE) ×2
SUT MNCRL 4-0 (SUTURE) ×2
SUT MNCRL 4-0 27XMFL (SUTURE) ×1
SUT V-LOC 90 ABS 3-0 VLT  V-20 (SUTURE)
SUT V-LOC 90 ABS 3-0 VLT V-20 (SUTURE) IMPLANT
SUT VIC AB 2-0 SH 27 (SUTURE) ×2
SUT VIC AB 2-0 SH 27XBRD (SUTURE) ×1 IMPLANT
SUT VICRYL 0 AB UR-6 (SUTURE) IMPLANT
SUT VLOC 180 2-0 9IN GS21 (SUTURE) IMPLANT
SUT VLOC 90 S/L VL9 GS22 (SUTURE) ×4 IMPLANT
SUTURE EHLN 3-0 FS-10 30 BLK (SUTURE) ×1 IMPLANT
SUTURE MNCRL 4-0 27XMF (SUTURE) ×1 IMPLANT
SYR 20ML LL LF (SYRINGE) ×2 IMPLANT
SYR 30ML LL (SYRINGE) ×2 IMPLANT
TROCAR BALLN GELPORT 12X130M (ENDOMECHANICALS) IMPLANT
TUBE GASTRO 14F 5C (TUBING) IMPLANT
TUBE JEJUNO 16X14 (TUBING) IMPLANT
TUBE JEJUNO 18X14 (TUBING) ×2 IMPLANT
TUBE JEJUNO 22X14 (TUBING) IMPLANT

## 2020-11-29 NOTE — Anesthesia Preprocedure Evaluation (Signed)
Anesthesia Evaluation  Patient identified by MRN, date of birth, ID band Patient awake    Reviewed: Allergy & Precautions, H&P , NPO status , Patient's Chart, lab work & pertinent test results  Airway Mallampati: II       Dental  (+) Partial Upper   Pulmonary neg pulmonary ROS, Current Smoker and Patient abstained from smoking.,    Pulmonary exam normal        Cardiovascular negative cardio ROS Normal cardiovascular exam Rhythm:Regular Rate:Normal     Neuro/Psych negative neurological ROS  negative psych ROS   GI/Hepatic negative GI ROS, Neg liver ROS,   Endo/Other  negative endocrine ROS  Renal/GU negative Renal ROS  negative genitourinary   Musculoskeletal negative musculoskeletal ROS (+)   Abdominal   Peds negative pediatric ROS (+)  Hematology negative hematology ROS (+)   Anesthesia Other Findings 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[place])   Reproductive/Obstetrics negative OB ROS                             Anesthesia Physical Anesthesia Plan  ASA: III  Anesthesia Plan: General   Post-op Pain Management:    Induction: Intravenous  PONV Risk Score and Plan: 1 and Ondansetron and Dexamethasone  Airway Management Planned: Oral ETT  Additional Equipment:   Intra-op Plan:   Post-operative Plan:   Informed Consent: I have reviewed the patients History and Physical, chart, labs and discussed the procedure including the risks, benefits and alternatives for the proposed anesthesia with the patient or authorized representative who has indicated his/her understanding and acceptance.     Dental advisory given  Plan Discussed with: CRNA, Anesthesiologist and Surgeon  Anesthesia Plan Comments:         Anesthesia Quick Evaluation

## 2020-11-29 NOTE — Op Note (Signed)
Robotic assisted laparoscopic gastrostomy tube   Pre-operative Diagnosis: malnutrition head and neck CA   Post-operative Diagnosis: same   Procedure:  Robotic assisted laparoscopic G tube # 18 FR   Surgeon: Caroleen Hamman, MD FACS   Anesthesia: Gen. with endotracheal tube   Findings: Stomach tacked to the abd wall , widely patent G tube w/o leaks   Estimated Blood Loss:5 cc             Complications: none     Procedure Details  The patient was seen again in the Holding Room. The benefits, complications, treatment options, and expected outcomes were discussed with the Family. The risks of bleeding, infection, recurrence of symptoms, failure to resolve symptoms, bowel injury, any of which could require further surgery were reviewed .   The  family concurred with the proposed plan, giving informed consent.  The patient was taken to Operating Room, identified  and the procedure verified. A Time Out was held and the above information confirmed.   Prior to the induction of general anesthesia, antibiotic prophylaxis was administered. VTE prophylaxis was in place. General endotracheal anesthesia was then administered and tolerated well. After the induction, the abdomen was prepped with Chloraprep and draped in the sterile fashion. The patient was positioned in the supine position.   Palmer's point was identified and using the a varies needle I was able to insert the needle in the standard fashion.  Drop saline test performed with good results and good pressures were observed.  Pneumoperitoneum was then created with CO2 and tolerated well without any adverse changes in the patient's vital signs.  Three 8-mm ports were placed under direct vision.  Inspection revealed large cirrhotic liver, Colon close to stomach. I grasped the body  stomach and tented toward the abdominal wall, small left subcostal incision created to placed Gastrostomy tube under direct visualization.     The patient was  positioned  in reverse Trendelenburg, robot was brought to the surgical field and docked in the standard fashion.  We made sure all the instrumentation was kept indirect view at all times and that there were no collision between the arms. I scrubbed out and went to the console. Using the scissors I performed a gastrotomy and confirmed that I was intraluminally. A partial pursestring suture was placed using 2 0 V-Loc suture around the G-tube in an inner fashion. I placed the G-tube through the gastrotomy in direct fashion.  I was able to get my scrub to flush saline via the G-tube confirming the proper position of the tube intraluminally.  The balloon was inflated and pulled back.  We then were able to tied the first pursestring in the standard fashion and completing our circumferential outer pursestring with the 2-0 V-Loc sutures Inspection of the  upper quadrant was performed. No bleeding, bile duct injury or leak, or bowel injury was noted.  All the needles were removed under direct visualization. Robotic instruments and robotic arms were undocked in the standard fashion.  I scrubbed back in. Liposomal Marcaine was used to infiltrate the abdominal wall at all incision sites   Pneumoperitoneum was released.  The periumbilical port site was closed with interrumpted 0 Vicryl sutures. 4-0 subcuticular Monocryl was used to close the skin. Dermabond was  applied.  The patient was then extubated and brought to the recovery room in stable condition. Sponge, lap, and needle counts were correct at closure and at the conclusion of the case.  Caroleen Hamman, MD, FACS

## 2020-11-29 NOTE — Discharge Instructions (Signed)

## 2020-11-29 NOTE — Anesthesia Postprocedure Evaluation (Signed)
Anesthesia Post Note  Patient: Paul Hebert  Procedure(s) Performed: XI ROBOT ASSISTED GASTROSTOMY TUBE PLACEMENT (N/A Abdomen)  Patient location during evaluation: PACU Anesthesia Type: General Level of consciousness: awake Pain management: pain level controlled Vital Signs Assessment: post-procedure vital signs reviewed and stable Respiratory status: spontaneous breathing Cardiovascular status: stable Postop Assessment: no apparent nausea or vomiting Anesthetic complications: no   No complications documented.   Last Vitals:  Vitals:   11/29/20 1313 11/29/20 1315  BP:  (!) 131/92  Pulse: 87 89  Resp: 15 (!) 24  Temp:  36.7 C  SpO2: 97% 100%    Last Pain:  Vitals:   11/29/20 1313  TempSrc:   PainSc: 5                  Neva Seat

## 2020-11-29 NOTE — Telephone Encounter (Signed)
Nutrition  Patient called RD and had G-tube placed today.  [employer] has been notified. Patient currently does not have DME coverage with current medicaid plan.  He is going to [employer].    RD provided education on how to clean around tube and give bolus tube feeding via phone.  RD has already discussed how to feed using syringe on past visits. Patient has seen sample tube. Patient reports that surgeon told him to wait 48 hours before using tube.    Patient has been eating well orally as has been on break from treatment due to COVID+.     RD will email hand written instructions to patient regarding cleaning site and how to give bolus feeding.   Patient will start on 2/16 1/2 carton of osmolite 1.5 in the AM and other 1/2 in PM. Flush 66ml before and after each feeding.  RD will meet with him on 2/21 when back in clinic for treatment.   Patient to continue to eat orally as able and drink ensure supplements.   Next visit 2/21  Shalom Mcguiness B. Zenia Resides, [place], [place] Registered Dietitian [phone] (mobile)

## 2020-11-29 NOTE — Anesthesia Procedure Notes (Signed)
Procedure Name: Intubation Date/Time: 11/29/2020 10:21 AM Performed by: Hedda Slade, CRNA Pre-anesthesia Checklist: Patient identified, Patient being monitored, Timeout performed, Emergency Drugs available and Suction available Patient Re-evaluated:Patient Re-evaluated prior to induction Oxygen Delivery Method: Circle system utilized Preoxygenation: Pre-oxygenation with 100% oxygen Induction Type: IV induction Ventilation: Mask ventilation without difficulty Laryngoscope Size: McGraph and 4 Grade View: Grade I Tube type: Oral Tube size: 7.0 mm Number of attempts: 1 Airway Equipment and Method: Stylet Placement Confirmation: ETT inserted through vocal cords under direct vision,  positive ETCO2 and breath sounds checked- equal and bilateral Secured at: 21 cm Tube secured with: Tape Dental Injury: Teeth and Oropharynx as per pre-operative assessment  Comments: Performed by Heide Scales, SRNA under direct supervision by Dr. Addison Bailey and Hedda Slade, CRNA

## 2020-11-29 NOTE — Transfer of Care (Signed)
Immediate Anesthesia Transfer of Care Note  Patient: Paul Hebert  Procedure(s) Performed: XI ROBOT ASSISTED GASTROSTOMY TUBE PLACEMENT (N/A Abdomen)  Patient Location: PACU  Anesthesia Type:General  Level of Consciousness: sedated  Airway & Oxygen Therapy: Patient Spontanous Breathing and Patient connected to face mask oxygen  Post-op Assessment: Report given to RN and Post -op Vital signs reviewed and stable  Post vital signs: Reviewed and stable  Last Vitals:  Vitals Value Taken Time  BP 116/72 11/29/20 1217  Temp 36.6 C 11/29/20 1217  Pulse 80 11/29/20 1217  Resp 14 11/29/20 1217  SpO2 100 % 11/29/20 1217  Vitals shown include unvalidated device data.  Last Pain:  Vitals:   11/29/20 1217  TempSrc:   PainSc: 0-No pain         Complications: No complications documented.

## 2020-11-29 NOTE — Interval H&P Note (Signed)
History and Physical Interval Note:  11/29/2020 9:35 AM  Paul Hebert  has presented today for surgery, with the diagnosis of head and neck cancer.  The various methods of treatment have been discussed with the patient and family. After consideration of risks, benefits and other options for treatment, the patient has consented to  Procedure(s): XI [place] (N/A) as a surgical intervention.  The patient's history has been reviewed, patient examined, no change in status, stable for surgery.  I have reviewed the patient's chart and labs.  Questions were answered to the patient's satisfaction.     [place]

## 2020-11-30 ENCOUNTER — Telehealth: Payer: Self-pay | Admitting: *Deleted

## 2020-11-30 ENCOUNTER — Other Ambulatory Visit: Payer: Self-pay | Admitting: *Deleted

## 2020-11-30 ENCOUNTER — Ambulatory Visit: Payer: Medicaid Other

## 2020-11-30 NOTE — Telephone Encounter (Signed)
Patient called and stated that he had a g tube placed yesterday by Dr Dahlia Byes, last night when he went to lay down he had really bad acid reflux. He had to sleep in a recliner all night. He did eat Roast Beef, mashed potatoes, salmon, biscuits and salad with [place] dressing. This morning he stated the acid reflux is better, he did take 4 Tums.   He is not using the feeding tube yet, he was told to wait 48 hours after surgery but when he moves he can feel the tube move on the inside of him. He wants to know if this is normal or not.  Patient also stated that he has some stomach jucies in the tube now, should he empty it and clean it or wait the 48 hours?

## 2020-11-30 NOTE — Telephone Encounter (Signed)
Patient states he wanted some reassurance as to cleaning his G- tube- he was instructed to wait the 48 hours before doing anything. He is eating by mouth right now.

## 2020-12-01 ENCOUNTER — Ambulatory Visit: Payer: Medicaid Other

## 2020-12-01 ENCOUNTER — Other Ambulatory Visit: Payer: Self-pay

## 2020-12-01 ENCOUNTER — Ambulatory Visit (INDEPENDENT_AMBULATORY_CARE_PROVIDER_SITE_OTHER): Payer: Self-pay | Admitting: Surgery

## 2020-12-01 DIAGNOSIS — Z09 Encounter for follow-up examination after completed treatment for conditions other than malignant neoplasm: Secondary | ICD-10-CM

## 2020-12-02 ENCOUNTER — Telehealth: Payer: Self-pay | Admitting: *Deleted

## 2020-12-02 ENCOUNTER — Ambulatory Visit: Payer: Medicaid Other

## 2020-12-02 ENCOUNTER — Telehealth: Payer: Self-pay

## 2020-12-02 ENCOUNTER — Other Ambulatory Visit: Payer: Self-pay | Admitting: *Deleted

## 2020-12-02 ENCOUNTER — Encounter: Payer: Self-pay | Admitting: Surgery

## 2020-12-02 MED ORDER — LORAZEPAM 0.5 MG PO TABS: 0.5000 mg | ORAL_TABLET | Freq: Three times a day (TID) | ORAL | 0 refills | Status: DC

## 2020-12-02 MED ORDER — FENTANYL 25 MCG/HR TD PT72: 1.0000 | MEDICATED_PATCH | TRANSDERMAL | 0 refills | Status: DC

## 2020-12-02 MED ORDER — OXYCODONE HCL 20 MG PO TABS: 20.0000 mg | ORAL_TABLET | ORAL | 0 refills | Status: DC | PRN

## 2020-12-02 NOTE — Telephone Encounter (Signed)
I called and left message that Paul Hebert and I both bout in refills for pain med. And I don't know how but it went to [facility] and I know it should have [facility] because the comments on the prescription was to pay for on byrd fund. I had to call [facility] 3 different times because of bring at lunch. I did speak to pharmacist and we cancelled the meds at [facility] and I called [facility] and they have both pain meds and ativan and they will have it ready by 5 pm. I tried both  Numbers and only got voicemail. So I left this message

## 2020-12-02 NOTE — Telephone Encounter (Signed)
Nutrition  Spoke with patient via phone for nutrition follow-up.  Patient reports soreness was rough for the first few days but it is improving from G-tube placement.  Patient states that Paul Hebert is coming out this afternoon.  Current Medicaid Family Planning does not cover DME supplies (enteral nutrition).  Patient has been informed to contact DOS regarding completing application for regular adult Medicaid.    RD has printed application for Paul Hebert program to see if he will qualify for assistance with tube feeding formula from [employer].  Discussed with patient and agreeable for RD and medical team to complete.  Informed patient that he will need to bring copies of either 1040 form, W2, paystub, SSA 1099 from previous year, unemployment letter to accompany application.  Patient will also need to sign application.  Patient agreeable and will bring everything on Monday, 2/21.    RD has completed nutrition part of application.  Past application on to medical team, Sherri, RN for further completion.    Paul Hebert, [place], [place] Registered Dietitian [phone] (mobile)

## 2020-12-02 NOTE — Progress Notes (Signed)
POD # 2 s/p robotic G tube HAs issues with anchoring stitch Otherwise doing well w only mild pain  PE Abd;soft g tube in place. Anchoring stitch removed per pt request. It is interfering with dressing. No infection or peritonirs Taught about proper care   A/P Doing well, ok to use G tube RTC prn

## 2020-12-02 NOTE — Telephone Encounter (Signed)
Earlier prescription was sent to wrong pharmacy

## 2020-12-03 ENCOUNTER — Ambulatory Visit: Payer: Medicaid Other

## 2020-12-05 ENCOUNTER — Other Ambulatory Visit: Payer: Self-pay | Admitting: *Deleted

## 2020-12-05 DIAGNOSIS — C109 Malignant neoplasm of oropharynx, unspecified: Secondary | ICD-10-CM

## 2020-12-05 DIAGNOSIS — R634 Abnormal weight loss: Secondary | ICD-10-CM

## 2020-12-06 ENCOUNTER — Inpatient Hospital Stay: Payer: Medicaid Other

## 2020-12-06 ENCOUNTER — Ambulatory Visit
Admission: RE | Admit: 2020-12-06 | Discharge: 2020-12-06 | Disposition: A | Discharge status: Home / Self Care | Payer: Medicaid Other | Source: Ambulatory Visit | Attending: Radiation Oncology | Admitting: Radiation Oncology

## 2020-12-06 ENCOUNTER — Other Ambulatory Visit: Payer: Self-pay | Admitting: *Deleted

## 2020-12-06 ENCOUNTER — Other Ambulatory Visit: Payer: Self-pay

## 2020-12-06 ENCOUNTER — Inpatient Hospital Stay (HOSPITAL_BASED_OUTPATIENT_CLINIC_OR_DEPARTMENT_OTHER): Payer: Medicaid Other | Admitting: Oncology

## 2020-12-06 ENCOUNTER — Encounter: Payer: Self-pay | Admitting: Oncology

## 2020-12-06 VITALS — BP 132/91 | HR 92 | Temp 98.9°F | Resp 20 | Wt 149.9 lb

## 2020-12-06 VITALS — BP 132/83 | HR 80 | Resp 18

## 2020-12-06 DIAGNOSIS — F17210 Nicotine dependence, cigarettes, uncomplicated: Secondary | ICD-10-CM | POA: Insufficient documentation

## 2020-12-06 DIAGNOSIS — Z8616 Personal history of COVID-19: Secondary | ICD-10-CM | POA: Insufficient documentation

## 2020-12-06 DIAGNOSIS — C109 Malignant neoplasm of oropharynx, unspecified: Secondary | ICD-10-CM | POA: Insufficient documentation

## 2020-12-06 DIAGNOSIS — Z79899 Other long term (current) drug therapy: Secondary | ICD-10-CM | POA: Insufficient documentation

## 2020-12-06 DIAGNOSIS — R634 Abnormal weight loss: Secondary | ICD-10-CM

## 2020-12-06 DIAGNOSIS — Z510 Encounter for antineoplastic radiation therapy: Secondary | ICD-10-CM | POA: Insufficient documentation

## 2020-12-06 DIAGNOSIS — Z5111 Encounter for antineoplastic chemotherapy: Secondary | ICD-10-CM | POA: Insufficient documentation

## 2020-12-06 DIAGNOSIS — F419 Anxiety disorder, unspecified: Secondary | ICD-10-CM | POA: Insufficient documentation

## 2020-12-06 DIAGNOSIS — Z923 Personal history of irradiation: Secondary | ICD-10-CM | POA: Insufficient documentation

## 2020-12-06 DIAGNOSIS — G893 Neoplasm related pain (acute) (chronic): Secondary | ICD-10-CM | POA: Insufficient documentation

## 2020-12-06 LAB — BASIC METABOLIC PANEL
Anion gap: 10 (ref 5–15)
BUN: 20 mg/dL (ref 6–20)
CO2: 29 mmol/L (ref 22–32)
Calcium: 8.7 mg/dL — ABNORMAL LOW (ref 8.9–10.3)
Chloride: 96 mmol/L — ABNORMAL LOW (ref 98–111)
Creatinine, Ser: 1 mg/dL (ref 0.61–1.24)
GFR, Estimated: >60 mL/min (ref >60)
Glucose, Bld: 127 mg/dL — ABNORMAL HIGH (ref 70–99)
Potassium: 4.1 mmol/L (ref 3.5–5.1)
Sodium: 135 mmol/L (ref 135–145)

## 2020-12-06 LAB — MAGNESIUM: Magnesium: 2 mg/dL (ref 1.7–2.4)

## 2020-12-06 LAB — CBC WITH DIFFERENTIAL/PLATELET
Abs Immature Granulocytes: 0.1 10*3/uL — ABNORMAL HIGH (ref 0.00–0.07)
Basophils Absolute: 0 10*3/uL (ref 0.0–0.1)
Basophils Relative: 0 %
Eosinophils Absolute: 0 10*3/uL (ref 0.0–0.5)
Eosinophils Relative: 0 %
HCT: 34 % — ABNORMAL LOW (ref 39.0–52.0)
Hemoglobin: 12.2 g/dL — ABNORMAL LOW (ref 13.0–17.0)
Immature Granulocytes: 1 %
Lymphocytes Relative: 6 %
Lymphs Abs: 0.7 10*3/uL (ref 0.7–4.0)
MCH: 34.6 pg — ABNORMAL HIGH (ref 26.0–34.0)
MCHC: 35.9 g/dL (ref 30.0–36.0)
MCV: 96.3 fL (ref 80.0–100.0)
Monocytes Absolute: 0.6 10*3/uL (ref 0.1–1.0)
Monocytes Relative: 5 %
Neutro Abs: 10.6 10*3/uL — ABNORMAL HIGH (ref 1.7–7.7)
Neutrophils Relative %: 88 %
Platelets: 236 10*3/uL (ref 150–400)
RBC: 3.53 MIL/uL — ABNORMAL LOW (ref 4.22–5.81)
RDW: 13.2 % (ref 11.5–15.5)
WBC: 12 10*3/uL — ABNORMAL HIGH (ref 4.0–10.5)
nRBC: 0 % (ref 0.0–0.2)

## 2020-12-06 LAB — PHOSPHORUS: Phosphorus: 3 mg/dL (ref 2.5–4.6)

## 2020-12-06 MED ORDER — SODIUM CHLORIDE 0.9% FLUSH
10.0000 mL | Freq: Once | INTRAVENOUS | Status: AC
  Administered 2020-12-06: 10 mL via INTRAVENOUS
  Filled 2020-12-06: qty 10

## 2020-12-06 MED ORDER — SODIUM CHLORIDE 0.9 % IV SOLN
150.0000 mg | Freq: Once | INTRAVENOUS | Status: AC
  Administered 2020-12-06: 150 mg via INTRAVENOUS
  Filled 2020-12-06: qty 150

## 2020-12-06 MED ORDER — ONDANSETRON HCL 8 MG PO TABS: 8.0000 mg | ORAL_TABLET | Freq: Two times a day (BID) | ORAL | 1 refills | Status: DC | PRN

## 2020-12-06 MED ORDER — PALONOSETRON HCL INJECTION 0.25 MG/5ML
0.2500 mg | Freq: Once | INTRAVENOUS | Status: AC
  Administered 2020-12-06: 0.25 mg via INTRAVENOUS
  Filled 2020-12-06: qty 5

## 2020-12-06 MED ORDER — HEPARIN SOD (PORK) LOCK FLUSH 100 UNIT/ML IV SOLN
500.0000 [IU] | Freq: Once | INTRAVENOUS | Status: AC
  Administered 2020-12-06: 500 [IU] via INTRAVENOUS
  Filled 2020-12-06: qty 5

## 2020-12-06 MED ORDER — SODIUM CHLORIDE 0.9 % IV SOLN
40.0000 mg/m2 | Freq: Once | INTRAVENOUS | Status: AC
  Administered 2020-12-06: 74 mg via INTRAVENOUS
  Filled 2020-12-06: qty 74

## 2020-12-06 MED ORDER — MAGNESIUM SULFATE 2 GM/50ML IV SOLN
2.0000 g | Freq: Once | INTRAVENOUS | Status: AC
  Administered 2020-12-06: 2 g via INTRAVENOUS
  Filled 2020-12-06: qty 50

## 2020-12-06 MED ORDER — HEPARIN SOD (PORK) LOCK FLUSH 100 UNIT/ML IV SOLN
INTRAVENOUS | Status: AC
  Filled 2020-12-06: qty 5

## 2020-12-06 MED ORDER — PROCHLORPERAZINE MALEATE 10 MG PO TABS: 10.0000 mg | ORAL_TABLET | Freq: Four times a day (QID) | ORAL | 1 refills | Status: DC | PRN

## 2020-12-06 MED ORDER — OXYCODONE HCL 5 MG PO TABS
20.0000 mg | ORAL_TABLET | Freq: Once | ORAL | Status: AC
  Administered 2020-12-06: 20 mg via ORAL
  Filled 2020-12-06: qty 4

## 2020-12-06 MED ORDER — SERTRALINE HCL 50 MG PO TABS: 50.0000 mg | ORAL_TABLET | Freq: Every day | ORAL | 2 refills | Status: DC

## 2020-12-06 MED ORDER — DEXAMETHASONE 4 MG PO TABS: 4.0000 mg | ORAL_TABLET | Freq: Two times a day (BID) | ORAL | 1 refills | Status: DC

## 2020-12-06 MED ORDER — SODIUM CHLORIDE 0.9 % IV SOLN
10.0000 mg | Freq: Once | INTRAVENOUS | Status: AC
  Administered 2020-12-06: 10 mg via INTRAVENOUS
  Filled 2020-12-06: qty 10

## 2020-12-06 MED ORDER — POTASSIUM CHLORIDE IN NACL 20-0.9 MEQ/L-% IV SOLN
Freq: Once | INTRAVENOUS | Status: AC
  Filled 2020-12-06: qty 1000

## 2020-12-06 MED ORDER — SODIUM CHLORIDE 0.9 % IV SOLN
Freq: Once | INTRAVENOUS | Status: AC
  Filled 2020-12-06: qty 250

## 2020-12-06 NOTE — Progress Notes (Signed)
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 He continued to have on and off pain which was gradually getting worse and he presented back to the ER on 09/27/2020.   CT soft tissue neck showed a left base of tongue mass measuring 3.4 x 2.6 x 3.3 cm which was invading the tongue along the anterior and medial margin and extending into the left vallecula. Possible superior and posterior extension to involve the left palatine tonsil the lesion abuts the uvula with early extension into the soft palate. Multiple left level 2 lymph nodes largest of which was 1.2 cm. Additional necrotic limited to level 3 nodal conglomerate measures 1.3 cm. No right-sided adenopathy.  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 PET CT scan Showed left base of tongue mass measuring about 2.8 cm with enlarged centrally necrotic level 2/3 left-sided and lymph nodes with an SUV about 3.7. No hypermetabolic right neck nodes. No findings of distant metastatic disease Plan is for concurrent weekly cisplatin with radiation treatment   Interval history-patient has not had daily chemotherapy for about 3 weeks now as he tested positive for Covid and his radiation was put on hold.  He starts radiation back today.  Reports that his tongue feels  raw.  Neck swelling has decreased in size.  Pain is currently reasonably well controlled with fentanyl patch and as needed oxycodone.  He would like to take something more for his anxiety and would like his lorazepam dose to be increased.  He had his PEG tube placed last week.  He is concerned about leak around the PEG tube.  Overall he is trying to improve his oral intake  ECOG PS- 1 Pain scale- 3 Opioid associated constipation- no  Review of systems- Review of Systems  Constitutional: Positive for malaise/fatigue. Negative for chills, fever and weight loss.  HENT: Positive for sore throat. Negative for congestion, ear discharge and nosebleeds.   Eyes: Negative for blurred vision.  Respiratory: Negative for cough, hemoptysis, sputum production, shortness of breath and wheezing.   Cardiovascular: Negative for chest pain, palpitations, orthopnea and claudication.  Gastrointestinal: Negative for abdominal pain, blood in stool, constipation, diarrhea, heartburn, melena, nausea and vomiting.  Genitourinary: Negative for dysuria, flank pain, frequency, hematuria and urgency.  Musculoskeletal: Negative for back pain, joint pain and myalgias.  Skin: Negative for rash.  Neurological: Negative for dizziness, tingling, focal weakness, seizures, weakness and headaches.  Endo/Heme/Allergies: Does not bruise/bleed easily.  Psychiatric/Behavioral: Negative for depression and suicidal ideas. The patient does not have insomnia.       No Known Allergies   Past Medical History:  Diagnosis Date  . 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 PORTA CATH INSERTION N/A 10/25/2020   Procedure: PORTA CATH INSERTION;  Surgeon: Algernon Huxley, MD;  Location: [place]  CV LAB;  Service: Cardiovascular;  Laterality: N/A;    Social History   Socioeconomic History  . Marital status: Single    Spouse name: Not on file  . Number of children: Not on file  . Years of  education: Not on file  . Highest education level: Not on file  Occupational History  . Not on file  Tobacco Use  . Smoking status: Current Every Day Smoker    Packs/day: 1.00  . Smokeless tobacco: Never Used  Vaping Use  . Vaping Use: Never used  Substance and Sexual Activity  . Alcohol use: Yes    Comment: rare   . Drug use: Not on file  . Sexual activity: Not on file  Other Topics Concern  . Not on file  Social History Narrative  . Not on file   Social Determinants of Health   Financial Resource Strain: Not on file  Food Insecurity: Not on file  Transportation Needs: Not on file  Physical Activity: Not on file  Stress: Not on file  Social Connections: Not on file  Intimate Partner Violence: Not on file    Family History  Problem Relation Age of Onset  . Cancer Father      Current Outpatient Medications:  .  dexamethasone (DECADRON) 4 MG tablet, Take 2 tablets (8 mg total) by mouth daily. Take daily x 3 days starting the day after cisplatin chemotherapy. Take with food. (Patient taking differently: Take 8 mg by mouth See admin instructions. Take 8 mg daily x 3 days starting the day after cisplatin chemotherapy. Take with food.), Disp: 30 tablet, Rfl: 1 .  dexamethasone (DECADRON) 4 MG tablet, Take 1 tablet (4 mg total) by mouth 2 (two) times daily with a meal., Disp: 60 tablet, Rfl: 1 .  fentaNYL (DURAGESIC) 25 MCG/HR, Place 1 patch onto the skin every 3 (three) days., Disp: 10 patch, Rfl: 0 .  gabapentin (NEURONTIN) 300 MG capsule, Take 2 capsules (600 mg total) by mouth 3 (three) times daily., Disp: 90 capsule, Rfl: 2 .  lidocaine-prilocaine (EMLA) cream, Apply to affected area once (Patient taking differently: Apply 1 application topically daily as needed (port access).), Disp: 30 g, Rfl: 3 .  LORazepam (ATIVAN) 0.5 MG tablet, Take 1 tablet (0.5 mg total) by mouth every 8 (eight) hours., Disp: 60 tablet, Rfl: 0 .  Nutritional Supplements (FEEDING SUPPLEMENT, OSMOLITE  1.5 CAL,) LIQD, Give 1 1/2 cartons of formula 4 times per day via feeding tube (8am, noon, 4pm and 8pm). Flush with 7ml of water before and after each feeding. Drink or give additional 237ml water TID between feedings via tube for better hydration., Disp: 1422 mL, Rfl: 0 .  ondansetron (ZOFRAN) 8 MG tablet, Take 1 tablet (8 mg total) by mouth 2 (two) times daily as needed. Start on the third day after cisplatin chemotherapy. (Patient taking differently: Take 8 mg by mouth 2 (two) times daily as needed for nausea or vomiting. Start on the third day after cisplatin chemotherapy.), Disp: 30 tablet, Rfl: 1 .  Oxycodone HCl 20 MG TABS, Take 1 tablet (20 mg total) by mouth every 4 (four) hours as needed., Disp: 180 tablet, Rfl: 0 .  prochlorperazine (COMPAZINE) 10 MG tablet, Take 1 tablet (10 mg total) by mouth every 6 (six) hours as needed (Nausea or vomiting)., Disp: 30 tablet, Rfl: 1 No current facility-administered medications for this visit.  Facility-Administered Medications Ordered in Other Visits:  .  heparin lock flush 100 unit/mL, 500 Units, Intravenous, Once,  Sindy Guadeloupe, MD  Physical exam:  Vitals:   12/06/20 0856  BP: (!) 132/91  Pulse: 92  Resp: 20  Temp: 98.9 F (37.2 C)  TempSrc: Tympanic  SpO2: 96%  Weight: 149 lb 14.4 oz (68 kg)   Physical Exam HENT:     Mouth/Throat:     Mouth: Mucous membranes are moist.     Pharynx: Oropharynx is clear.  Eyes:     Extraocular Movements: EOM normal.  Cardiovascular:     Rate and Rhythm: Normal rate and regular rhythm.     Heart sounds: Normal heart sounds.  Pulmonary:     Effort: Pulmonary effort is normal.     Breath sounds: Normal breath sounds.  Abdominal:     Comments: PEG tube in place with dressing around it and minor leak noted around the PEG tube  Skin:    General: Skin is warm and dry.  Neurological:     Mental Status: He is alert and oriented to person, place, and time.      CMP Latest Ref Rng & Units 11/29/2020   Glucose 70 - 99 mg/dL 104(H)  BUN 6 - 20 mg/dL 13  Creatinine 0.61 - 1.24 mg/dL 0.93  Sodium 135 - 145 mmol/L 136  Potassium 3.5 - 5.1 mmol/L 4.2  Chloride 98 - 111 mmol/L 98  CO2 22 - 32 mmol/L 29  Calcium 8.9 - 10.3 mg/dL 9.0  Total Protein 6.5 - 8.1 g/dL 6.5  Total Bilirubin 0.3 - 1.2 mg/dL 0.7  Alkaline Phos 38 - 126 U/L 55  AST 15 - 41 U/L 20  ALT 0 - 44 U/L 15   CBC Latest Ref Rng & Units 12/06/2020  WBC 4.0 - 10.5 K/uL 12.0(H)  Hemoglobin 13.0 - 17.0 g/dL 12.2(L)  Hematocrit 39.0 - 52.0 % 34.0(L)  Platelets 150 - 400 K/uL 236      Assessment and plan- Patient is a 50 y.o. male with stage IHPV positive squamous cell carcinoma of the oropharynx/left base of tongue T2 N1 M0.   He is here for on treatment assessment prior to cycle 3 of weekly cisplatin chemotherapy.  Counts okay to proceed with cycle 3 of weekly cisplatin chemotherapy today.  I will see him back in 1 week for cycle 4.  Treatment was on hold for about 3 weeks now due to worsening mucositis following which patient tested positive for Covid.  Mucositis has resolved now.  Neoplasm related pain: Continue gabapentin fentanyl patch and as needed oxycodone.  Medications were recently refilled 3 days ago.  Anxiety: Continue as needed lorazepam but I have added Zoloft 50 mg daily to his regimen.  Leak around PEG tube: It appears to be minimal and I have asked the patient to adjust the cork of the PEG tube.  If the leakage continues to be a problem he will need to see Dr. Adora Fridge   Visit Diagnosis 1. Encounter for antineoplastic chemotherapy   2. Squamous cell carcinoma of oropharynx (HCC)      Dr. Randa Evens, MD, MPH [facility] [phone] 12/06/2020 8:30 AM

## 2020-12-06 NOTE — Progress Notes (Signed)
Nutrition Follow-up:  Patient with squamous cell carcinoma of oropharynx/left base of tongue, HPV+.  Patient restarting chemotherapy and radiation today on hold due to recent COVID+.   Met with patient during infusion.  Patient reports that dog jumped up on feeding tube, Dr Janese Banks aware.  Patient has been giving 1-1 1/2 cartons of osmolite 1.5 per day along with eating and drinking orally.  Patient reports eating yesterday bottle of ensure and equate shake. Breakfast was egg, sausage and jalepeno burrito. Lunch was fish, shrimp, slaw.  Supper last night was roast beef sandwich from [employer]. Ate another sandwich for breakfast and took 1 carton of osmolite.  Reports having regular bowel movement without difficulty.     Medications: reviewed  Labs: reviewed  Anthropometrics:   Weight 149 lb 14.4 oz today increased from 145 lb on 1/31   Estimated Energy Needs  Kcals: 1980-2300 Protein: 99-115 g Fluid: > 2 L  NUTRITION DIAGNOSIS: Predicted suboptimal energy intake continues with resuming treatment    INTERVENTION:  Handout on instructions for PEG feeding, cleaning, etc given to patient again today. Emailed information earlier.  [employer] has been out to do teaching with patient.   Recommend at least 1-2 cartons of osmolite 1.5 via feeding tube over next week.  Flush with 28m of water before and after.  As intake decreases can increase feedings of osmolite 1.5 to 6 cartons per day (1 1/2 cartons QID), if unable to take much in via mouth.  Written instructions given to patient.  Verbalized understanding. Sample cases of osmolite 1.5 given to patient today.  Current insurance will not cover enteral nutrition. Patient says he will contact Social Services again today.  Patient has contact information.     MONITORING, EVALUATION, GOAL: weight trends, intake, tube feeding   NEXT VISIT: Feb 28 during infusion  Paul Hebert B. [place] Resides [place] LIsabelaRegistered Dietitian [phone](mobile)

## 2020-12-07 ENCOUNTER — Ambulatory Visit: Payer: Medicaid Other

## 2020-12-08 ENCOUNTER — Encounter: Payer: Self-pay | Admitting: Surgery

## 2020-12-08 ENCOUNTER — Ambulatory Visit: Payer: Medicaid Other

## 2020-12-08 ENCOUNTER — Ambulatory Visit (INDEPENDENT_AMBULATORY_CARE_PROVIDER_SITE_OTHER): Payer: Self-pay | Admitting: Surgery

## 2020-12-08 ENCOUNTER — Other Ambulatory Visit: Payer: Self-pay

## 2020-12-08 VITALS — BP 126/85 | HR 88 | Temp 98.3°F | Ht 69.0 in | Wt 147.0 lb

## 2020-12-08 DIAGNOSIS — K9423 Gastrostomy malfunction: Secondary | ICD-10-CM

## 2020-12-08 NOTE — Progress Notes (Signed)
12/08/2020  HPI: Paul Hebert is a 50 y.o. male s/p robotic assisted gastrostomy tube placement on 11/29/20 by Dr. Dahlia Byes.  He was seen by Dr. Dahlia Byes on 2/16 because the suture holding the tube to the skin was irritating him a lot, this was removed at bedside.  Yesterday he called the office because his dog jumped on him yesterday and he feels that the tube was pulled somewhat, and he's been having pain at the tube insertion site with some bloody drainage.  Denies any troubles with the tube flushing or feeds.  Vital signs: BP 126/85   Pulse 88   Temp 98.3 F (36.8 C) (Oral)   Ht 5\' 9"  (1.753 m)   Wt 147 lb (66.7 kg)   SpO2 94%   BMI 21.71 kg/m    Physical Exam: Constitutional: No acute distress Abdomen:  Soft, non-distended, with some focal tenderness at the tube insertion site.  The balloon was deflated and the gastrostomy tube pushed inwards slowly and slightly.  The balloon was then inflated again using 20 cc of air.  At that point, the patient reported feeling much better.  The flange was secured down again and gauze dressing applied.  Assessment/Plan: This is a 50 y.o. male s/p robotic gastrostomy tube placement.  --I think when his dog jumped on him it may have pulled the tube enough that the balloon may have become lodged within the stomach wall, causing the bleeding and discomfort.  After repositioning, the pain was much improved.   --Advised him to keep the tube well dressed at all times so that there is no tugging or pulling. --Follow up with Dr. Dahlia Byes as needed.   Melvyn Neth, [facility]

## 2020-12-08 NOTE — Patient Instructions (Signed)
You will need to keep a piece of gauze under the tubing and place a dressing over the top.  Please call the office if you have questions or concerns.

## 2020-12-09 ENCOUNTER — Ambulatory Visit
Admission: RE | Admit: 2020-12-09 | Discharge: 2020-12-09 | Disposition: A | Discharge status: Home / Self Care | Payer: Medicaid Other | Source: Ambulatory Visit | Attending: Physician Assistant | Admitting: Physician Assistant

## 2020-12-09 ENCOUNTER — Ambulatory Visit: Payer: Medicaid Other

## 2020-12-09 ENCOUNTER — Telehealth: Payer: Self-pay | Admitting: *Deleted

## 2020-12-09 ENCOUNTER — Other Ambulatory Visit: Payer: Self-pay

## 2020-12-09 ENCOUNTER — Ambulatory Visit (INDEPENDENT_AMBULATORY_CARE_PROVIDER_SITE_OTHER): Payer: Medicaid Other | Admitting: Physician Assistant

## 2020-12-09 ENCOUNTER — Encounter: Payer: Self-pay | Admitting: Physician Assistant

## 2020-12-09 ENCOUNTER — Ambulatory Visit: Admission: RE | Admit: 2020-12-09 | Payer: Medicaid Other | Source: Ambulatory Visit

## 2020-12-09 VITALS — BP 152/98 | HR 102 | Temp 98.8°F | Wt 142.4 lb

## 2020-12-09 DIAGNOSIS — K9420 Gastrostomy complication, unspecified: Secondary | ICD-10-CM

## 2020-12-09 DIAGNOSIS — Z09 Encounter for follow-up examination after completed treatment for conditions other than malignant neoplasm: Secondary | ICD-10-CM

## 2020-12-09 MED ORDER — DIATRIZOATE MEGLUMINE & SODIUM 66-10 % PO SOLN
30.0000 mL | Freq: Once | ORAL | Status: AC
  Administered 2020-12-09: 30 mL via ORAL

## 2020-12-09 NOTE — Telephone Encounter (Signed)
He states the area is very red and burning. He states that the button that the tube is coming out about two inches when he breaths. He will come in this morning to see Paul Plover PA-C.

## 2020-12-09 NOTE — Progress Notes (Signed)
[facility] POST-OP OFFICE VISIT  12/09/2020  HPI: Paul Hebert is a 50 y.o. male 10 days s/p robotic assisted laparoscopic gastrostomy tube placement for malnutrition secondary to oropharyngeal cancer with Dr Dahlia Byes.   He was seen in clinic yesterday (02/23) by Dr Hampton Abbot as his dog had dislodged his gastrostomy. Dr Hampton Abbot repositioned the tube and inflated the balloon which seemed to resolve his issues. However, when he got home he continued to notice persistent drainage from around his tube no matter what he tried to eat or instill through the tube. This has resulted in significant erythema and tenderness surrounding the tube site as well. He states "he wants it removed and doesn't care if he loses significant weight."    Vital signs: BP (!) 152/98   Pulse (!) 102   Temp 98.8 F (37.1 C) (Oral)   Wt 142 lb 6.4 oz (64.6 kg)   SpO2 98%   BMI 21.03 kg/m    Physical Exam: Constitutional: Appears uncomfortable, NAD Abdomen: Soft, tenderness around G-tube site, skin is erythematous, appears to have significant drainage from around the tube. With the help of Dr Celine Ahr, we were able to install 20 ccs into balloon and secure this. He seemed to have immediate improvement. The tube flushed without leaking.   Assessment/Plan: This is a 50 y.o. male 10 days s/p robotic assisted laparoscopic gastrostomy tube placement for malnutrition secondary to oropharyngeal cancer   - G-tube balloon re-inflated, secured, and flushed without leaking. Dressing placed.   - We will send him for stat imaging to ensure G-tube is in right place  - Follow up as needed based on imaging results  -- Edison Simon, PA-C [facility] 12/09/2020, 11:09 AM [phone] M-F: 7am - 4pm

## 2020-12-09 NOTE — Patient Instructions (Addendum)
Please go directly to [facility] through the [place] entrance. Check in at the registration desk on the right.

## 2020-12-09 NOTE — Telephone Encounter (Signed)
Patient came in yesterday due to the feeding tube being pulled out by his dog yesterday. He stated that all night last night he was in extreme pain. He is taking oxycodone but it is not helping. When he puts the feeding supplements in the tube most of it comes running out on his stomach. Patient noticed that around his tube where it is inserted that it is inflamed and red. Please call and advise

## 2020-12-10 ENCOUNTER — Other Ambulatory Visit: Payer: Self-pay

## 2020-12-10 ENCOUNTER — Encounter: Payer: Self-pay | Admitting: Physician Assistant

## 2020-12-10 ENCOUNTER — Ambulatory Visit
Admission: RE | Admit: 2020-12-10 | Discharge: 2020-12-10 | Disposition: A | Discharge status: Home / Self Care | Payer: Medicaid Other | Source: Ambulatory Visit | Attending: Physician Assistant | Admitting: Physician Assistant

## 2020-12-10 ENCOUNTER — Other Ambulatory Visit: Payer: Self-pay | Admitting: Physician Assistant

## 2020-12-10 ENCOUNTER — Ambulatory Visit: Payer: Medicaid Other

## 2020-12-10 ENCOUNTER — Ambulatory Visit (INDEPENDENT_AMBULATORY_CARE_PROVIDER_SITE_OTHER): Payer: Medicaid Other | Admitting: Physician Assistant

## 2020-12-10 VITALS — BP 166/90 | HR 84 | Temp 98.2°F | Ht 69.0 in | Wt 142.3 lb

## 2020-12-10 DIAGNOSIS — C109 Malignant neoplasm of oropharynx, unspecified: Secondary | ICD-10-CM | POA: Insufficient documentation

## 2020-12-10 DIAGNOSIS — K9423 Gastrostomy malfunction: Secondary | ICD-10-CM

## 2020-12-10 DIAGNOSIS — K9420 Gastrostomy complication, unspecified: Secondary | ICD-10-CM

## 2020-12-10 DIAGNOSIS — Z09 Encounter for follow-up examination after completed treatment for conditions other than malignant neoplasm: Secondary | ICD-10-CM

## 2020-12-10 MED ORDER — IOHEXOL 300 MG/ML  SOLN
10.0000 mL | Freq: Once | INTRAMUSCULAR | Status: AC | PRN
  Administered 2020-12-10: 10 mL

## 2020-12-10 NOTE — Progress Notes (Signed)
[facility] POST-OP OFFICE VISIT  12/10/2020  HPI: Paul Hebert is a 50 y.o. male 11 days s/p robotic assisted laparoscopic gastrostomy tube placement for malnutrition secondary to oropharyngeal cancer with Dr Dahlia Byes.   He was seen in clinic yesterday for persistent gastrostomy tube issues. We again attempted to reposition the tube and re-inflate the balloon. This provided temporary relief but continued to leak around the tube itself. I did obtain XR yesterday which confirmed position. He presents today for follow up. Unfortunately, his tube continues to leak even with PO intake. He has been using Desitin which has help with his skin maceration.   Vital signs: BP (!) 166/90   Pulse 84   Temp 98.2 F (36.8 C) (Oral)   Ht 5\' 9"  (1.753 m)   Wt 142 lb 4.8 oz (64.5 kg)   SpO2 98%   BMI 21.01 kg/m    Physical Exam: Constitutional: Appears uncomfortable, NAD Abdomen: Soft, tenderness around G-tube site, skin is erythematous and macerated but improved from yesterday, this does appear to be leaking around the tube  Assessment/Plan: This is a 50 y.o. male 11 days s/p robotic assisted laparoscopic gastrostomy tube placement for malnutrition secondary to oropharyngeal cancer   - I did discuss this case on the phone with Dr Darrick Huntsman (IR) regarding ability to preform gastrostomy tube exchange today, which he was in agreement with. Hope to upsize from 18 FR tube to 20 Fr tube today. He understands that there is no guarantee this will completely subside his leaking.   - He will follow up as needed following this; encouraged him to call office on Monday if issues persist  -- Edison Simon, PA-C [facility] 12/10/2020, 10:28 AM [phone] M-F: 7am - 4pm

## 2020-12-10 NOTE — Procedures (Signed)
Interventional Radiology Procedure Note  Procedure: Gastrostomy tube exchange  Findings: Please refer to procedural dictation for full description.  Indwelling tube in good position.  Exchanged for 20 Fr balloon retention gastrostomy, balloon inflated with 10 mL sterile water.  Good position confirmed fluoroscopically.  The patient stated feeling better immediately.  Complications: None immediate  Estimated Blood Loss: < 5 mL  Recommendations: Continue good local wound care. I recommended wearing an abdominal binder, and demonstrated how to protect the tube. Please call Interventional Radiology with any questions or further issues with the gastrostomy tube.   Ruthann Cancer, MD Pager: [phone]

## 2020-12-10 NOTE — Patient Instructions (Signed)
Please go directly to [facility] to have the tube exchanged.

## 2020-12-13 ENCOUNTER — Inpatient Hospital Stay: Payer: Medicaid Other

## 2020-12-13 ENCOUNTER — Encounter: Payer: Self-pay | Admitting: Oncology

## 2020-12-13 ENCOUNTER — Ambulatory Visit
Admission: RE | Admit: 2020-12-13 | Discharge: 2020-12-13 | Disposition: A | Discharge status: Home / Self Care | Payer: Medicaid Other | Source: Ambulatory Visit | Attending: Radiation Oncology | Admitting: Radiation Oncology

## 2020-12-13 ENCOUNTER — Telehealth: Payer: Self-pay

## 2020-12-13 ENCOUNTER — Inpatient Hospital Stay (HOSPITAL_BASED_OUTPATIENT_CLINIC_OR_DEPARTMENT_OTHER): Payer: Medicaid Other | Admitting: Oncology

## 2020-12-13 ENCOUNTER — Other Ambulatory Visit: Payer: Self-pay

## 2020-12-13 VITALS — BP 140/82 | HR 88 | Temp 98.2°F | Resp 16 | Wt 145.5 lb

## 2020-12-13 DIAGNOSIS — G893 Neoplasm related pain (acute) (chronic): Secondary | ICD-10-CM | POA: Diagnosis not present

## 2020-12-13 DIAGNOSIS — B370 Candidal stomatitis: Secondary | ICD-10-CM | POA: Diagnosis not present

## 2020-12-13 DIAGNOSIS — R12 Heartburn: Secondary | ICD-10-CM

## 2020-12-13 DIAGNOSIS — Z5111 Encounter for antineoplastic chemotherapy: Secondary | ICD-10-CM | POA: Diagnosis not present

## 2020-12-13 DIAGNOSIS — R634 Abnormal weight loss: Secondary | ICD-10-CM

## 2020-12-13 DIAGNOSIS — E871 Hypo-osmolality and hyponatremia: Secondary | ICD-10-CM

## 2020-12-13 DIAGNOSIS — C109 Malignant neoplasm of oropharynx, unspecified: Secondary | ICD-10-CM | POA: Diagnosis not present

## 2020-12-13 DIAGNOSIS — Z510 Encounter for antineoplastic radiation therapy: Secondary | ICD-10-CM | POA: Diagnosis not present

## 2020-12-13 LAB — CBC WITH DIFFERENTIAL/PLATELET
Abs Immature Granulocytes: 0.04 10*3/uL (ref 0.00–0.07)
Basophils Absolute: 0 10*3/uL (ref 0.0–0.1)
Basophils Relative: 0 %
Eosinophils Absolute: 0 10*3/uL (ref 0.0–0.5)
Eosinophils Relative: 1 %
HCT: 35.2 % — ABNORMAL LOW (ref 39.0–52.0)
Hemoglobin: 13.1 g/dL (ref 13.0–17.0)
Immature Granulocytes: 1 %
Lymphocytes Relative: 18 %
Lymphs Abs: 1.4 10*3/uL (ref 0.7–4.0)
MCH: 34.7 pg — ABNORMAL HIGH (ref 26.0–34.0)
MCHC: 37.2 g/dL — ABNORMAL HIGH (ref 30.0–36.0)
MCV: 93.4 fL (ref 80.0–100.0)
Monocytes Absolute: 0.8 10*3/uL (ref 0.1–1.0)
Monocytes Relative: 10 %
Neutro Abs: 5.5 10*3/uL (ref 1.7–7.7)
Neutrophils Relative %: 70 %
Platelets: 314 10*3/uL (ref 150–400)
RBC: 3.77 MIL/uL — ABNORMAL LOW (ref 4.22–5.81)
RDW: 13.2 % (ref 11.5–15.5)
WBC: 7.8 10*3/uL (ref 4.0–10.5)
nRBC: 0 % (ref 0.0–0.2)

## 2020-12-13 LAB — BASIC METABOLIC PANEL
Anion gap: 10 (ref 5–15)
BUN: 19 mg/dL (ref 6–20)
CO2: 30 mmol/L (ref 22–32)
Calcium: 9.2 mg/dL (ref 8.9–10.3)
Chloride: 90 mmol/L — ABNORMAL LOW (ref 98–111)
Creatinine, Ser: 0.96 mg/dL (ref 0.61–1.24)
GFR, Estimated: >60 mL/min (ref >60)
Glucose, Bld: 97 mg/dL (ref 70–99)
Potassium: 3.9 mmol/L (ref 3.5–5.1)
Sodium: 130 mmol/L — ABNORMAL LOW (ref 135–145)

## 2020-12-13 LAB — MAGNESIUM: Magnesium: 1.9 mg/dL (ref 1.7–2.4)

## 2020-12-13 LAB — PHOSPHORUS: Phosphorus: 3.9 mg/dL (ref 2.5–4.6)

## 2020-12-13 MED ORDER — POTASSIUM CHLORIDE IN NACL 20-0.9 MEQ/L-% IV SOLN
Freq: Once | INTRAVENOUS | Status: AC
  Filled 2020-12-13: qty 1000

## 2020-12-13 MED ORDER — SODIUM CHLORIDE 0.9% FLUSH
10.0000 mL | INTRAVENOUS | Status: DC | PRN
  Administered 2020-12-13: 10 mL via INTRAVENOUS
  Filled 2020-12-13: qty 10

## 2020-12-13 MED ORDER — SODIUM CHLORIDE 0.9 % IV SOLN
40.0000 mg/m2 | Freq: Once | INTRAVENOUS | Status: AC
  Administered 2020-12-13: 74 mg via INTRAVENOUS
  Filled 2020-12-13: qty 74

## 2020-12-13 MED ORDER — SODIUM CHLORIDE 0.9 % IV SOLN
Freq: Once | INTRAVENOUS | Status: AC
  Filled 2020-12-13: qty 250

## 2020-12-13 MED ORDER — HEPARIN SOD (PORK) LOCK FLUSH 100 UNIT/ML IV SOLN
500.0000 [IU] | Freq: Once | INTRAVENOUS | Status: AC
  Administered 2020-12-13: 500 [IU] via INTRAVENOUS
  Filled 2020-12-13: qty 5

## 2020-12-13 MED ORDER — SODIUM CHLORIDE 0.9 % IV SOLN
150.0000 mg | Freq: Once | INTRAVENOUS | Status: AC
  Administered 2020-12-13: 150 mg via INTRAVENOUS
  Filled 2020-12-13: qty 150

## 2020-12-13 MED ORDER — SODIUM CHLORIDE 0.9 % IV SOLN
Freq: Once | INTRAVENOUS | Status: AC
Start: 2020-12-13 — End: 2020-12-13
  Filled 2020-12-13: qty 250

## 2020-12-13 MED ORDER — MAGNESIUM SULFATE 2 GM/50ML IV SOLN
2.0000 g | Freq: Once | INTRAVENOUS | Status: AC
  Administered 2020-12-13: 2 g via INTRAVENOUS
  Filled 2020-12-13: qty 50

## 2020-12-13 MED ORDER — SODIUM CHLORIDE 0.9 % IV SOLN
10.0000 mg | Freq: Once | INTRAVENOUS | Status: AC
  Administered 2020-12-13: 10 mg via INTRAVENOUS
  Filled 2020-12-13: qty 10

## 2020-12-13 MED ORDER — PALONOSETRON HCL INJECTION 0.25 MG/5ML
0.2500 mg | Freq: Once | INTRAVENOUS | Status: AC
  Administered 2020-12-13: 0.25 mg via INTRAVENOUS
  Filled 2020-12-13: qty 5

## 2020-12-13 MED ORDER — NYSTATIN 100000 UNIT/ML MT SUSP: 5.0000 mL | Freq: Four times a day (QID) | OROMUCOSAL | 1 refills | Status: DC

## 2020-12-13 MED ORDER — PANTOPRAZOLE SODIUM 20 MG PO TBEC: 20.0000 mg | DELAYED_RELEASE_TABLET | Freq: Every day | ORAL | 1 refills | Status: DC

## 2020-12-13 NOTE — Progress Notes (Signed)
Per MD- additional 1 liter NS today along with tx.

## 2020-12-13 NOTE — Progress Notes (Signed)
Pt had tube replaced and he is raw from the leaking of the side of tube and his skin is irritated. He is using desitin on it. He is experiencing reflux and took tums with no help

## 2020-12-13 NOTE — Progress Notes (Signed)
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 He continued to have on and off pain which was gradually getting worse and he presented back to the ER on 09/27/2020.   CT soft tissue neck showed a left base of tongue mass measuring 3.4 x 2.6 x 3.3 cm which was invading the tongue along the anterior and medial margin and extending into the left vallecula. Possible superior and posterior extension to involve the left palatine tonsil the lesion abuts the uvula with early extension into the soft palate. Multiple left level 2 lymph nodes largest of which was 1.2 cm. Additional necrotic limited to level 3 nodal conglomerate measures 1.3 cm. No right-sided adenopathy.  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 PET CT scan Showed left base of tongue mass measuring about 2.8 cm with enlarged centrally necrotic level 2/3 left-sided and lymph nodes with an SUV about 3.7. No hypermetabolic right neck nodes. No findings of distant metastatic disease Plan is for concurrent weekly cisplatin with radiation treatment   Interval history-patient reports persistent leakage around his PEG tube site.  His PEG tube was recently replaced by a new one but he continues to have problems with leakage.  He has come up with his own  dressing around the peg tube to minimize leakage and reports that it has been helping.  Patient reports symptoms of heartburn  ECOG PS- 1 Pain scale- 3 Opioid associated constipation- no  Review of systems- Review of Systems  Constitutional: Positive for malaise/fatigue. Negative for chills, fever and weight loss.  HENT: Negative for congestion, ear discharge and nosebleeds.   Eyes: Negative for blurred vision.  Respiratory: Negative for cough, hemoptysis, sputum production, shortness of breath and wheezing.   Cardiovascular: Negative for chest pain, palpitations, orthopnea and claudication.  Gastrointestinal: Negative for abdominal pain, blood in stool, constipation, diarrhea, heartburn, melena, nausea and vomiting.       Pain around PEG tube site  Genitourinary: Negative for dysuria, flank pain, frequency, hematuria and urgency.  Musculoskeletal: Negative for back pain, joint pain and myalgias.  Skin: Negative for rash.  Neurological: Negative for dizziness, tingling, focal weakness, seizures, weakness and headaches.  Endo/Heme/Allergies: Does not bruise/bleed easily.  Psychiatric/Behavioral: Negative for depression and suicidal ideas. The patient does not have insomnia.       No Known Allergies   Past Medical History:  Diagnosis Date  . 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 PORTA CATH INSERTION N/A 10/25/2020   Procedure: PORTA CATH INSERTION;  Surgeon: Algernon Huxley, MD;  Location: [facility];  Service: Cardiovascular;  Laterality: N/A;    Social History   Socioeconomic History  . Marital status: Single    Spouse name: Not on file  . Number of children: Not on file  . Years of  education: Not on file  . Highest education level: Not on file  Occupational History  . Not on file  Tobacco Use  . Smoking status: Current Every Day Smoker    Packs/day: 1.00  . Smokeless tobacco: Never Used  Vaping Use  . Vaping Use:  Never used  Substance and Sexual Activity  . Alcohol use: Yes    Comment: rare   . Drug use: Not on file  . Sexual activity: Not on file  Other Topics Concern  . Not on file  Social History Narrative  . Not on file   Social Determinants of Health   Financial Resource Strain: Not on file  Food Insecurity: Not on file  Transportation Needs: Not on file  Physical Activity: Not on file  Stress: Not on file  Social Connections: Not on file  Intimate Partner Violence: Not on file    Family History  Problem Relation Age of Onset  . Cancer Father      Current Outpatient Medications:  .  dexamethasone (DECADRON) 4 MG tablet, Take 2 tablets (8 mg total) by mouth daily. Take daily x 3 days starting the day after cisplatin chemotherapy. Take with food. (Patient taking differently: Take 8 mg by mouth See admin instructions. Take 8 mg daily x 3 days starting the day after cisplatin chemotherapy. Take with food.), Disp: 30 tablet, Rfl: 1 .  dexamethasone (DECADRON) 4 MG tablet, Take 1 tablet (4 mg total) by mouth 2 (two) times daily with a meal., Disp: 60 tablet, Rfl: 1 .  fentaNYL (DURAGESIC) 25 MCG/HR, Place 1 patch onto the skin every 3 (three) days., Disp: 10 patch, Rfl: 0 .  gabapentin (NEURONTIN) 300 MG capsule, Take 2 capsules (600 mg total) by mouth 3 (three) times daily., Disp: 90 capsule, Rfl: 2 .  lidocaine-prilocaine (EMLA) cream, Apply to affected area once (Patient taking differently: Apply 1 application topically daily as needed (port access).), Disp: 30 g, Rfl: 3 .  LORazepam (ATIVAN) 0.5 MG tablet, Take 1 tablet (0.5 mg total) by mouth every 8 (eight) hours., Disp: 60 tablet, Rfl: 0 .  Nutritional Supplements (FEEDING SUPPLEMENT, OSMOLITE 1.5 CAL,) LIQD, Give 1 1/2 cartons of formula 4 times per day via feeding tube (8am, noon, 4pm and 8pm). Flush with 54ml of water before and after each feeding. Drink or give additional 283ml water TID between feedings via tube for better  hydration., Disp: 1422 mL, Rfl: 0 .  ondansetron (ZOFRAN) 8 MG tablet, Take 1 tablet (8 mg total) by mouth 2 (two) times daily as needed. Start on the third day after cisplatin chemotherapy., Disp: 30 tablet, Rfl: 1 .  Oxycodone HCl 20 MG TABS, Take 1 tablet (20 mg total) by mouth every 4 (four) hours as needed., Disp: 180 tablet, Rfl: 0 .  prochlorperazine (COMPAZINE) 10 MG tablet, Take 1 tablet (10 mg total) by mouth every 6 (six) hours as needed (Nausea or vomiting)., Disp: 30 tablet, Rfl: 1 .  sertraline (ZOLOFT) 50 MG tablet, Take 1 tablet (50 mg total) by mouth daily., Disp: 30 tablet, Rfl: 2 No current facility-administered medications for this visit.  Facility-Administered Medications Ordered in Other Visits:  .  heparin lock flush 100 unit/mL, 500 Units, Intravenous, Once, Randa Evens C, MD .  sodium chloride flush (NS) 0.9 % injection 10 mL, 10 mL, Intravenous, PRN, Sindy Guadeloupe, MD, 10 mL at 12/13/20 0816  Physical exam:  Vitals:   12/13/20 0906  BP: 140/82  Pulse: 88  Resp: 16  Temp: 98.2 F (36.8 C)  TempSrc: Oral  Weight: 145 lb 8 oz (66 kg)   Physical Exam HENT:     Mouth/Throat:     Comments: Thrush noted over oropharynx Eyes:     Extraocular Movements: EOM normal.  Cardiovascular:     Rate and Rhythm: Normal rate and regular rhythm.     Heart sounds: Normal heart sounds.  Pulmonary:     Effort: Pulmonary effort is normal.     Breath sounds: Normal breath sounds.  Abdominal:     General: Bowel sounds are normal.     Palpations: Abdomen is soft.     Comments: PEG tube in place with minimal leakage around it and small area of excoriation around the PEG  Skin:    General: Skin is warm and dry.  Neurological:     Mental Status: He is alert and oriented to person, place, and time.      CMP Latest Ref Rng & Units 12/13/2020  Glucose 70 - 99 mg/dL 97  BUN 6 - 20 mg/dL 19  Creatinine 0.61 - 1.24 mg/dL 0.96  Sodium 135 - 145 mmol/L 130(L)  Potassium 3.5 -  5.1 mmol/L 3.9  Chloride 98 - 111 mmol/L 90(L)  CO2 22 - 32 mmol/L 30  Calcium 8.9 - 10.3 mg/dL 9.2  Total Protein 6.5 - 8.1 g/dL -  Total Bilirubin 0.3 - 1.2 mg/dL -  Alkaline Phos 38 - 126 U/L -  AST 15 - 41 U/L -  ALT 0 - 44 U/L -   CBC Latest Ref Rng & Units 12/13/2020  WBC 4.0 - 10.5 K/uL 7.8  Hemoglobin 13.0 - 17.0 g/dL 13.1  Hematocrit 39.0 - 52.0 % 35.2(L)  Platelets 150 - 400 K/uL 314    No images are attached to the encounter.  DG ABDOMEN PEG TUBE LOCATION  Result Date: 12/09/2020 CLINICAL DATA:  Gastrostomy catheter placement EXAM: ABDOMEN - 1 VIEW COMPARISON:  PET-CT October 14, 2020 FINDINGS: Contrast was administered into a gastrostomy catheter. Contrast flows through the gastrostomy catheter into the stomach. Contrast flows from the stomach into the duodenum. No contrast extravasation. There is fairly diffuse stool throughout the colon. There is no bowel dilatation or air-fluid level to suggest bowel obstruction. No free air. Lung bases are clear. IMPRESSION: Gastrostomy catheter position within the stomach. Contrast flows freely through the gastrostomy catheter in the stomach and from the stomach into the duodenum. No contrast extravasation. Diffuse stool in the colon may be indicative of underlying constipation. No bowel obstruction or free air. Lung bases are clear. Electronically Signed   By: Lowella Grip III M.D.   On: 12/09/2020 14:52   DG REPLACE G-TUBE SIMPLE WO FLUORO  Result Date: 12/10/2020 INDICATION: 50 year old male with history of oropharyngeal squamous cell carcinoma status post robotically assisted laparoscopic gastrostomy tube placement (Dr. Dahlia Byes, 11/29/20) referred by the surgery clinic for gastrostomy tube exchange in the setting of leakage from the indwelling gastrostomy. Radiograph from the prior day demonstrates appropriate position of the gastrostomy tube. EXAM: Gastrostomy tube exchange MEDICATIONS: None. ANESTHESIA/SEDATION: None. CONTRAST:  20  mL Omnipaque-administered into the gastric lumen. FLUOROSCOPY TIME:  Fluoroscopy Time: 0 minutes 9 seconds COMPLICATIONS: None immediate. PROCEDURE: Informed written consent was obtained from the patient after a thorough discussion of the procedural risks, benefits and alternatives. All questions were addressed. Maximal Sterile Barrier Technique was utilized including caps, mask, sterile gowns, sterile gloves, sterile drape, hand hygiene and skin antiseptic. A timeout was performed prior to the  initiation of the procedure. Hand injection of contrast via the indwelling tube confirmed appropriate position within the gastric lumen. An Amplatz wire was inserted and coiled within the stomach. The indwelling balloon was aspirated until totally deflated. The gastrostomy was removed over the wire. A new, 20 French balloon retention gastrostomy tube was then inserted over the wire and the balloon was inflated with 10 mL of sterile water. The wire was removed and gentle hand injection of contrast demonstrated per position within the stomach. The bumper was cinched down to the skin. The patient tolerated the procedure well without immediate complication. IMPRESSION: Successful upsize of indwelling 18 French gastrostomy tube to 20 French gastrostomy tube. Ruthann Cancer, MD [facility] Electronically Signed   By: Ruthann Cancer MD   On: 12/10/2020 14:18     Assessment and plan- Patient is a 50 y.o. male with stage IHPV positive squamous cell carcinoma of the oropharynx/left base of tongue T2 N1 M0.He is here for on treatment assessment prior to cycle 4 of weekly cisplatin chemotherapy  Positive proceed with cycle 4 of weekly cisplatin chemotherapy today.  He will directly proceed for cycle 5 next week and I will see him back in 2 weeks for cycle 6.  Hyponatremia: He will receive additional IV fluids today  Oral thrush: We will send him a prescription for  nystatin swish and swallow.  Anxiety: Continue as needed lorazepam.  Patient could not tolerate Zoloft 50 mg daily and I have asked him to reduce it to 25 mg  Neoplasm related pain: Continue fentanyl patch gabapentin and as needed oxycodone  Heartburn: We will add 20 mg of Protonix   Visit Diagnosis 1. Encounter for antineoplastic chemotherapy   2. Squamous cell carcinoma of oropharynx ([place])   3. Oral thrush   4. Neoplasm related pain   5. Hyponatremia      Dr. Randa Evens, MD, MPH [facility] [phone] 12/13/2020 8:33 AM

## 2020-12-13 NOTE — Telephone Encounter (Signed)
Nutrition Follow-up:  Patient with squamous cell carcinoma of oropharynx/left base of tongue, HPV+.  Patient receiving chemotherapy and radiation therapy. Notes reviewed regarding PEG tube.  Met with patient in infusion.  Patient reports that he has not used feeding tube for nutrition in the last week due to leaking.  "Everything I ate orally can out around the tube." Reports that he flushed tube yesterday with water and worked well.  Yesterday ate 2 scrambled eggs with salsa, 2 yogurts and probiotic shakes, ate pepperoni with crackers, mixed nuts, popcorn, milkshake and pork with green peppers and onions with squash and zucchini.  Reports that throat is sore, no taste, some issues with reflux.      Medications: reviewed  Labs: reviewed  Anthropometrics:   Weight 145 lb 8 oz today decreased from 149 lb 14.4 oz on 2/21.  145 lb 1/31   Estimated Energy Needs  Kcals: 1980-2300 Protein: 99-115 g Fluid: > 2 L  NUTRITION DIAGNOSIS: Predicted suboptimal energy intake continues   INTERVENTION:  Recommend patient to start using 1-2 cartons of osmolite 1.5 between eating orally.  Flush with 4m of water before and after each feeding.  Patient to increase osmolite 1.5 via tube on days when eating less orally.  Goal rate of 6 per day if unable to eat but only bites of food.       MONITORING, EVALUATION, GOAL: weight trends, intake   NEXT VISIT: Monday, March 7 during infusion  Joli B. AZenia Resides RPlain[place] LBaldwinRegistered Dietitian [phone](mobile)

## 2020-12-14 ENCOUNTER — Ambulatory Visit
Admission: RE | Admit: 2020-12-14 | Discharge: 2020-12-14 | Disposition: A | Discharge status: Home / Self Care | Payer: Medicaid Other | Source: Ambulatory Visit | Attending: Radiation Oncology | Admitting: Radiation Oncology

## 2020-12-14 DIAGNOSIS — Z510 Encounter for antineoplastic radiation therapy: Secondary | ICD-10-CM | POA: Diagnosis not present

## 2020-12-14 DIAGNOSIS — C109 Malignant neoplasm of oropharynx, unspecified: Secondary | ICD-10-CM | POA: Insufficient documentation

## 2020-12-15 ENCOUNTER — Ambulatory Visit
Admission: RE | Admit: 2020-12-15 | Discharge: 2020-12-15 | Disposition: A | Discharge status: Home / Self Care | Payer: Medicaid Other | Source: Ambulatory Visit | Attending: Radiation Oncology | Admitting: Radiation Oncology

## 2020-12-15 DIAGNOSIS — Z510 Encounter for antineoplastic radiation therapy: Secondary | ICD-10-CM | POA: Diagnosis not present

## 2020-12-16 ENCOUNTER — Ambulatory Visit
Admission: RE | Admit: 2020-12-16 | Discharge: 2020-12-16 | Disposition: A | Discharge status: Home / Self Care | Payer: Medicaid Other | Source: Ambulatory Visit | Attending: Radiation Oncology | Admitting: Radiation Oncology

## 2020-12-16 DIAGNOSIS — Z510 Encounter for antineoplastic radiation therapy: Secondary | ICD-10-CM | POA: Diagnosis not present

## 2020-12-17 ENCOUNTER — Ambulatory Visit
Admission: RE | Admit: 2020-12-17 | Discharge: 2020-12-17 | Disposition: A | Discharge status: Home / Self Care | Payer: Medicaid Other | Source: Ambulatory Visit | Attending: Radiation Oncology | Admitting: Radiation Oncology

## 2020-12-17 ENCOUNTER — Ambulatory Visit: Payer: Medicaid Other

## 2020-12-17 DIAGNOSIS — Z510 Encounter for antineoplastic radiation therapy: Secondary | ICD-10-CM | POA: Diagnosis not present

## 2020-12-20 ENCOUNTER — Ambulatory Visit: Payer: Medicaid Other

## 2020-12-20 ENCOUNTER — Inpatient Hospital Stay: Payer: Medicaid Other

## 2020-12-20 ENCOUNTER — Ambulatory Visit
Admission: RE | Admit: 2020-12-20 | Discharge: 2020-12-20 | Disposition: A | Discharge status: Home / Self Care | Payer: Medicaid Other | Source: Ambulatory Visit | Attending: Radiation Oncology | Admitting: Radiation Oncology

## 2020-12-20 ENCOUNTER — Inpatient Hospital Stay: Payer: Medicaid Other | Attending: Oncology

## 2020-12-20 VITALS — BP 144/85 | HR 82 | Temp 97.0°F | Resp 17 | Wt 140.4 lb

## 2020-12-20 DIAGNOSIS — E871 Hypo-osmolality and hyponatremia: Secondary | ICD-10-CM | POA: Insufficient documentation

## 2020-12-20 DIAGNOSIS — K1233 Oral mucositis (ulcerative) due to radiation: Secondary | ICD-10-CM | POA: Insufficient documentation

## 2020-12-20 DIAGNOSIS — L739 Follicular disorder, unspecified: Secondary | ICD-10-CM | POA: Insufficient documentation

## 2020-12-20 DIAGNOSIS — B370 Candidal stomatitis: Secondary | ICD-10-CM | POA: Insufficient documentation

## 2020-12-20 DIAGNOSIS — Z5111 Encounter for antineoplastic chemotherapy: Secondary | ICD-10-CM | POA: Insufficient documentation

## 2020-12-20 DIAGNOSIS — R634 Abnormal weight loss: Secondary | ICD-10-CM

## 2020-12-20 DIAGNOSIS — C109 Malignant neoplasm of oropharynx, unspecified: Secondary | ICD-10-CM

## 2020-12-20 DIAGNOSIS — Y842 Radiological procedure and radiotherapy as the cause of abnormal reaction of the patient, or of later complication, without mention of misadventure at the time of the procedure: Secondary | ICD-10-CM | POA: Insufficient documentation

## 2020-12-20 DIAGNOSIS — Z931 Gastrostomy status: Secondary | ICD-10-CM | POA: Insufficient documentation

## 2020-12-20 DIAGNOSIS — Z79899 Other long term (current) drug therapy: Secondary | ICD-10-CM | POA: Insufficient documentation

## 2020-12-20 DIAGNOSIS — Z510 Encounter for antineoplastic radiation therapy: Secondary | ICD-10-CM | POA: Diagnosis not present

## 2020-12-20 LAB — CBC WITH DIFFERENTIAL/PLATELET
Abs Immature Granulocytes: 0.06 10*3/uL (ref 0.00–0.07)
Basophils Absolute: 0 10*3/uL (ref 0.0–0.1)
Basophils Relative: 0 %
Eosinophils Absolute: 0 10*3/uL (ref 0.0–0.5)
Eosinophils Relative: 0 %
HCT: 34.5 % — ABNORMAL LOW (ref 39.0–52.0)
Hemoglobin: 12.7 g/dL — ABNORMAL LOW (ref 13.0–17.0)
Immature Granulocytes: 1 %
Lymphocytes Relative: 8 %
Lymphs Abs: 0.9 10*3/uL (ref 0.7–4.0)
MCH: 34.1 pg — ABNORMAL HIGH (ref 26.0–34.0)
MCHC: 36.8 g/dL — ABNORMAL HIGH (ref 30.0–36.0)
MCV: 92.7 fL (ref 80.0–100.0)
Monocytes Absolute: 0.6 10*3/uL (ref 0.1–1.0)
Monocytes Relative: 5 %
Neutro Abs: 9.6 10*3/uL — ABNORMAL HIGH (ref 1.7–7.7)
Neutrophils Relative %: 86 %
Platelets: 319 10*3/uL (ref 150–400)
RBC: 3.72 MIL/uL — ABNORMAL LOW (ref 4.22–5.81)
RDW: 13.2 % (ref 11.5–15.5)
WBC: 11.1 10*3/uL — ABNORMAL HIGH (ref 4.0–10.5)
nRBC: 0 % (ref 0.0–0.2)

## 2020-12-20 LAB — BASIC METABOLIC PANEL WITH GFR
Anion gap: 10 (ref 5–15)
BUN: 20 mg/dL (ref 6–20)
CO2: 26 mmol/L (ref 22–32)
Calcium: 8.8 mg/dL — ABNORMAL LOW (ref 8.9–10.3)
Chloride: 93 mmol/L — ABNORMAL LOW (ref 98–111)
Creatinine, Ser: 0.71 mg/dL (ref 0.61–1.24)
GFR, Estimated: >60 mL/min
Glucose, Bld: 99 mg/dL (ref 70–99)
Potassium: 3.9 mmol/L (ref 3.5–5.1)
Sodium: 129 mmol/L — ABNORMAL LOW (ref 135–145)

## 2020-12-20 LAB — PHOSPHORUS: Phosphorus: 4.2 mg/dL (ref 2.5–4.6)

## 2020-12-20 LAB — MAGNESIUM: Magnesium: 2 mg/dL (ref 1.7–2.4)

## 2020-12-20 MED ORDER — PALONOSETRON HCL INJECTION 0.25 MG/5ML
0.2500 mg | Freq: Once | INTRAVENOUS | Status: AC
  Administered 2020-12-20: 0.25 mg via INTRAVENOUS
  Filled 2020-12-20: qty 5

## 2020-12-20 MED ORDER — HEPARIN SOD (PORK) LOCK FLUSH 100 UNIT/ML IV SOLN
500.0000 [IU] | Freq: Once | INTRAVENOUS | Status: DC | PRN
  Filled 2020-12-20: qty 5

## 2020-12-20 MED ORDER — SODIUM CHLORIDE 0.9 % IV SOLN
Freq: Once | INTRAVENOUS | Status: AC
  Filled 2020-12-20: qty 250

## 2020-12-20 MED ORDER — SODIUM CHLORIDE 0.9 % IV SOLN
10.0000 mg | Freq: Once | INTRAVENOUS | Status: AC
  Administered 2020-12-20: 10 mg via INTRAVENOUS
  Filled 2020-12-20: qty 1

## 2020-12-20 MED ORDER — SODIUM CHLORIDE 0.9% FLUSH
10.0000 mL | INTRAVENOUS | Status: DC | PRN
  Administered 2020-12-20: 10 mL via INTRAVENOUS
  Filled 2020-12-20: qty 10

## 2020-12-20 MED ORDER — HEPARIN SOD (PORK) LOCK FLUSH 100 UNIT/ML IV SOLN
500.0000 [IU] | Freq: Once | INTRAVENOUS | Status: AC
  Administered 2020-12-20: 500 [IU] via INTRAVENOUS
  Filled 2020-12-20: qty 5

## 2020-12-20 MED ORDER — SODIUM CHLORIDE 0.9 % IV SOLN
150.0000 mg | Freq: Once | INTRAVENOUS | Status: AC
  Administered 2020-12-20: 150 mg via INTRAVENOUS
  Filled 2020-12-20: qty 150

## 2020-12-20 MED ORDER — POTASSIUM CHLORIDE IN NACL 20-0.9 MEQ/L-% IV SOLN
Freq: Once | INTRAVENOUS | Status: AC
  Filled 2020-12-20: qty 1000

## 2020-12-20 MED ORDER — MAGNESIUM SULFATE 2 GM/50ML IV SOLN
2.0000 g | Freq: Once | INTRAVENOUS | Status: AC
  Administered 2020-12-20: 2 g via INTRAVENOUS
  Filled 2020-12-20: qty 50

## 2020-12-20 MED ORDER — CISPLATIN CHEMO INJECTION 100MG/100ML
40.0000 mg/m2 | Freq: Once | INTRAVENOUS | Status: AC
  Administered 2020-12-20: 74 mg via INTRAVENOUS
  Filled 2020-12-20: qty 74

## 2020-12-20 NOTE — Progress Notes (Signed)
1010: Per Faythe Casa NP pt to receive 1 liter NS over 1 hour in addition to treatment.

## 2020-12-20 NOTE — Progress Notes (Signed)
Nutrition Follow-up:  Patient with squamous cell carcinoma of oropharynx/left base of tongue, HPV+.  Patient receiving chemotherapy and radiation therapy.  PEG placed.    Met with patient during infusion.  Patient reports sore throat, burning.  Reports oral intake has declined and girlfriend has been getting on him to increase tube feeding through tube.  Patient says he has been giving about 3 cartons via tube and flushing with 16 oz bottle of water each time (~531m).  Ate yesterday 2 doughnuts, ice cream, pork chop and squash and zucchini.  Drinking body armour drinks, orange aid, water, tea.  Reports heartburn but better with medication    Medications: reviewed  Labs: Na 129  Anthropometrics:   Weight 140 lb 6 oz decreased from 145 lb 8 oz on 2/28 145 kb ib 1/31   Estimated Energy Needs  Kcals: 1980-2300 Protein: 99-115 g Fluid: > 2 L  NUTRITION DIAGNOSIS: Predicted suboptimal energy intake continues   INTERVENTION:  Recommend patient use 4-6 cartons of tube feeding daily. Flush with 8 oz of water before and after each feeding with Na trending down.  Discussed fluids that contain sodium for patient to drink and incorporate in diet.  Patient to continue to eat foods orally as able RD [place] and patient has been approved for 12 cases of osmolite 1.5 in 1 year. [employer] has contacted patient.      MONITORING, EVALUATION, GOAL: weight trends, intake, tube feeding   NEXT VISIT: March 14 during infusion  Joli B. AZenia Resides RChelan LJames IslandRegistered Dietitian [phone](mobile)

## 2020-12-21 ENCOUNTER — Ambulatory Visit: Payer: Medicaid Other

## 2020-12-21 ENCOUNTER — Ambulatory Visit
Admission: RE | Admit: 2020-12-21 | Discharge: 2020-12-21 | Disposition: A | Discharge status: Home / Self Care | Payer: Medicaid Other | Source: Ambulatory Visit | Attending: Radiation Oncology | Admitting: Radiation Oncology

## 2020-12-21 DIAGNOSIS — Z510 Encounter for antineoplastic radiation therapy: Secondary | ICD-10-CM | POA: Diagnosis not present

## 2020-12-22 ENCOUNTER — Ambulatory Visit: Payer: Medicaid Other

## 2020-12-23 ENCOUNTER — Telehealth: Payer: Self-pay

## 2020-12-23 ENCOUNTER — Ambulatory Visit: Payer: Medicaid Other

## 2020-12-23 NOTE — Telephone Encounter (Signed)
Nutrition  RD received call from patient wanting to know if RD had been able to get in touch with program that can give him more formula.    Patient reports that mouth is burning more and may have to take a break from radiation treatment.  Patient says that he had been giving 4 cartons of osmolite 1.5 via tube since Monday and eating orally.  Patient aware that he can increase tube feeding to 6 cartons per day over the weekend if oral intake decreases.  Patient denies needs formula today or over the weekend. RD has sample cases of formula that patient can use and wants to pick those up on Monday, 3/17.    RD spoke with [facility] representative and they have approved patient to receive a total of 12 free cases of osmolite 1.5 in 1 calendar year.  They will ship first 6 cases and then patient will need to call when needs remaining 6 cases. This information discussed with patient and gave him the phone number [phone] to call and request first shipment.    Patient appreciative of information.   RD will follow-up on Monday, 3/17  Joli B. Zenia Resides, [place], [place] Registered Dietitian [phone] (mobile)

## 2020-12-24 ENCOUNTER — Ambulatory Visit: Payer: Medicaid Other

## 2020-12-27 ENCOUNTER — Encounter: Payer: Self-pay | Admitting: Oncology

## 2020-12-27 ENCOUNTER — Other Ambulatory Visit: Payer: Self-pay

## 2020-12-27 ENCOUNTER — Ambulatory Visit: Payer: Medicaid Other

## 2020-12-27 ENCOUNTER — Inpatient Hospital Stay: Payer: Medicaid Other

## 2020-12-27 ENCOUNTER — Other Ambulatory Visit: Payer: Self-pay | Admitting: *Deleted

## 2020-12-27 ENCOUNTER — Other Ambulatory Visit: Payer: Self-pay | Admitting: Oncology

## 2020-12-27 ENCOUNTER — Inpatient Hospital Stay (HOSPITAL_BASED_OUTPATIENT_CLINIC_OR_DEPARTMENT_OTHER): Payer: Medicaid Other | Admitting: Oncology

## 2020-12-27 VITALS — BP 127/83 | HR 98 | Temp 98.6°F | Resp 16 | Ht 69.0 in | Wt 143.9 lb

## 2020-12-27 DIAGNOSIS — Z931 Gastrostomy status: Secondary | ICD-10-CM | POA: Insufficient documentation

## 2020-12-27 DIAGNOSIS — C109 Malignant neoplasm of oropharynx, unspecified: Secondary | ICD-10-CM | POA: Diagnosis not present

## 2020-12-27 DIAGNOSIS — Y842 Radiological procedure and radiotherapy as the cause of abnormal reaction of the patient, or of later complication, without mention of misadventure at the time of the procedure: Secondary | ICD-10-CM | POA: Diagnosis not present

## 2020-12-27 DIAGNOSIS — Z5111 Encounter for antineoplastic chemotherapy: Secondary | ICD-10-CM | POA: Diagnosis not present

## 2020-12-27 DIAGNOSIS — B370 Candidal stomatitis: Secondary | ICD-10-CM | POA: Diagnosis not present

## 2020-12-27 DIAGNOSIS — Z79899 Other long term (current) drug therapy: Secondary | ICD-10-CM | POA: Insufficient documentation

## 2020-12-27 DIAGNOSIS — L739 Follicular disorder, unspecified: Secondary | ICD-10-CM | POA: Insufficient documentation

## 2020-12-27 DIAGNOSIS — K1233 Oral mucositis (ulcerative) due to radiation: Secondary | ICD-10-CM | POA: Diagnosis not present

## 2020-12-27 DIAGNOSIS — R634 Abnormal weight loss: Secondary | ICD-10-CM | POA: Diagnosis not present

## 2020-12-27 DIAGNOSIS — E871 Hypo-osmolality and hyponatremia: Secondary | ICD-10-CM | POA: Diagnosis not present

## 2020-12-27 LAB — PHOSPHORUS: Phosphorus: 3.2 mg/dL (ref 2.5–4.6)

## 2020-12-27 LAB — CBC WITH DIFFERENTIAL/PLATELET
Abs Immature Granulocytes: 0.08 10*3/uL — ABNORMAL HIGH (ref 0.00–0.07)
Basophils Absolute: 0 10*3/uL (ref 0.0–0.1)
Basophils Relative: 0 %
Eosinophils Absolute: 0 10*3/uL (ref 0.0–0.5)
Eosinophils Relative: 0 %
HCT: 31.1 % — ABNORMAL LOW (ref 39.0–52.0)
Hemoglobin: 11.4 g/dL — ABNORMAL LOW (ref 13.0–17.0)
Immature Granulocytes: 1 %
Lymphocytes Relative: 7 %
Lymphs Abs: 0.7 10*3/uL (ref 0.7–4.0)
MCH: 35.3 pg — ABNORMAL HIGH (ref 26.0–34.0)
MCHC: 36.7 g/dL — ABNORMAL HIGH (ref 30.0–36.0)
MCV: 96.3 fL (ref 80.0–100.0)
Monocytes Absolute: 0.3 10*3/uL (ref 0.1–1.0)
Monocytes Relative: 3 %
Neutro Abs: 9.1 10*3/uL — ABNORMAL HIGH (ref 1.7–7.7)
Neutrophils Relative %: 89 %
Platelets: 177 10*3/uL (ref 150–400)
RBC: 3.23 MIL/uL — ABNORMAL LOW (ref 4.22–5.81)
RDW: 14.1 % (ref 11.5–15.5)
WBC: 10.2 10*3/uL (ref 4.0–10.5)
nRBC: 0 % (ref 0.0–0.2)

## 2020-12-27 LAB — BASIC METABOLIC PANEL
Anion gap: 7 (ref 5–15)
BUN: 27 mg/dL — ABNORMAL HIGH (ref 6–20)
CO2: 29 mmol/L (ref 22–32)
Calcium: 8.9 mg/dL (ref 8.9–10.3)
Chloride: 94 mmol/L — ABNORMAL LOW (ref 98–111)
Creatinine, Ser: 0.77 mg/dL (ref 0.61–1.24)
GFR, Estimated: >60 mL/min (ref >60)
Glucose, Bld: 127 mg/dL — ABNORMAL HIGH (ref 70–99)
Potassium: 4.2 mmol/L (ref 3.5–5.1)
Sodium: 130 mmol/L — ABNORMAL LOW (ref 135–145)

## 2020-12-27 LAB — MAGNESIUM: Magnesium: 1.9 mg/dL (ref 1.7–2.4)

## 2020-12-27 MED ORDER — NYSTATIN 100000 UNIT/ML MT SUSP: 5.0000 mL | Freq: Four times a day (QID) | OROMUCOSAL | 1 refills | Status: DC

## 2020-12-27 MED ORDER — OXYCODONE HCL 20 MG PO TABS: 20.0000 mg | ORAL_TABLET | ORAL | 0 refills | Status: DC | PRN

## 2020-12-27 MED ORDER — SODIUM CHLORIDE 0.9% FLUSH
10.0000 mL | Freq: Once | INTRAVENOUS | Status: DC
  Filled 2020-12-27: qty 10

## 2020-12-27 MED ORDER — AMOXICILLIN-POT CLAVULANATE 875-125 MG PO TABS: 1.0000 | ORAL_TABLET | Freq: Two times a day (BID) | ORAL | 0 refills | Status: DC

## 2020-12-27 MED ORDER — HEPARIN SOD (PORK) LOCK FLUSH 100 UNIT/ML IV SOLN
500.0000 [IU] | Freq: Once | INTRAVENOUS | Status: DC
  Filled 2020-12-27: qty 5

## 2020-12-27 MED ORDER — LORAZEPAM 0.5 MG PO TABS: 0.5000 mg | ORAL_TABLET | Freq: Three times a day (TID) | ORAL | 0 refills | Status: DC

## 2020-12-27 MED ORDER — FLUCONAZOLE 100 MG PO TABS: 100.0000 mg | ORAL_TABLET | Freq: Every day | ORAL | 0 refills | Status: DC

## 2020-12-27 MED ORDER — FENTANYL 25 MCG/HR TD PT72: 1.0000 | MEDICATED_PATCH | TRANSDERMAL | 0 refills | Status: DC

## 2020-12-27 MED ORDER — FENTANYL 12 MCG/HR TD PT72: 1.0000 | MEDICATED_PATCH | TRANSDERMAL | 0 refills | Status: DC

## 2020-12-27 NOTE — Progress Notes (Signed)
Pt has white spots on back of throat and last week after 2 days radiation dr Baruch Gouty cancelled the rest of the week. He has been tube feeding only, he said chips of ice helped some but that is the only thing he has taken orally.he has taken all the dexamethasone that is for each cisplatin tx. Out of all nausea pills, out of patch and pain pills. And lorazepam. He tried the zoloft and he was crying, then angry, then having crazy dreams. He stopped taking the zoloft. He has been doing tube feedings 6-7 cans a day

## 2020-12-27 NOTE — Progress Notes (Signed)
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 He continued to have on and off pain which was gradually getting worse and he presented back to the ER on 09/27/2020.   CT soft tissue neck showed a left base of tongue mass measuring 3.4 x 2.6 x 3.3 cm which was invading the tongue along the anterior and medial margin and extending into the left vallecula. Possible superior and posterior extension to involve the left palatine tonsil the lesion abuts the uvula with early extension into the soft palate. Multiple left level 2 lymph nodes largest of which was 1.2 cm. Additional necrotic limited to level 3 nodal conglomerate measures 1.3 cm. No right-sided adenopathy.  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 PET CT scan Showed left base of tongue mass measuring about 2.8 cm with enlarged centrally necrotic level 2/3 left-sided and lymph nodes with an SUV about 3.7. No hypermetabolic right neck nodes. No findings of distant metastatic disease  Patient started concurrent chemoradiation with weekly cisplatin chemotherapy.  Treatment delayed due to patient testing positive for Covid in between treatments as well as ongoing radiation mucositis   Interval history-patient reports significant pain in his throat.  He is  unable to swallow and is only taking ice chips.  He is relying on his PEG tube for his nutrition and takes about 6-8 Osmolite's per day  ECOG PS- 1-2 Pain scale- 8 Opioid associated constipation- no  Review of systems- Review of Systems  Constitutional: Positive for malaise/fatigue. Negative for chills, fever and weight loss.  HENT: Negative for congestion, ear discharge and nosebleeds.        Throat pain  Eyes: Negative for blurred vision.  Respiratory: Negative for cough, hemoptysis, sputum production, shortness of breath and wheezing.   Cardiovascular: Negative for chest pain, palpitations, orthopnea and claudication.  Gastrointestinal: Negative for abdominal pain, blood in stool, constipation, diarrhea, heartburn, melena, nausea and vomiting.  Genitourinary: Negative for dysuria, flank pain, frequency, hematuria and urgency.  Musculoskeletal: Negative for back pain, joint pain and myalgias.  Skin: Negative for rash.  Neurological: Negative for dizziness, tingling, focal weakness, seizures, weakness and headaches.  Endo/Heme/Allergies: Does not bruise/bleed easily.  Psychiatric/Behavioral: Negative for depression and suicidal ideas. The patient does not have insomnia.        No Known Allergies   Past Medical History:  Diagnosis Date  . 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 PORTA CATH INSERTION N/A 10/25/2020   Procedure: PORTA CATH INSERTION;  Surgeon: Algernon Huxley, MD;  Location: [facility];  Service: Cardiovascular;  Laterality: N/A;    Social History   Socioeconomic History  . Marital status: Single    Spouse name: Not on file  . Number of children: Not on file  . Years of education: Not on file  .  Highest education level: Not on file  Occupational History  . Not on file  Tobacco Use  . Smoking status: Current Every Day Smoker    Packs/day: 0.50  . Smokeless tobacco: Never Used  Vaping Use  . Vaping Use:  Never used  Substance and Sexual Activity  . Alcohol use: Yes    Comment: once a week a beer or  2  . Drug use: Not on file  . Sexual activity: Not Currently  Other Topics Concern  . Not on file  Social History Narrative  . Not on file   Social Determinants of Health   Financial Resource Strain: Not on file  Food Insecurity: Not on file  Transportation Needs: Not on file  Physical Activity: Not on file  Stress: Not on file  Social Connections: Not on file  Intimate Partner Violence: Not on file    Family History  Problem Relation Age of Onset  . Cancer Father      Current Outpatient Medications:  .  dexamethasone (DECADRON) 4 MG tablet, Take 2 tablets (8 mg total) by mouth daily. Take daily x 3 days starting the day after cisplatin chemotherapy. Take with food. (Patient taking differently: Take 8 mg by mouth See admin instructions. Take 8 mg daily x 3 days starting the day after cisplatin chemotherapy. Take with food.), Disp: 30 tablet, Rfl: 1 .  gabapentin (NEURONTIN) 300 MG capsule, TAKE 1 CAPSULE (300 MG) BY MOUTH 3 TIMESDAILY, Disp: 90 capsule, Rfl: 2 .  lidocaine-prilocaine (EMLA) cream, Apply to affected area once (Patient taking differently: Apply 1 application topically daily as needed (port access).), Disp: 30 g, Rfl: 3 .  Nutritional Supplements (FEEDING SUPPLEMENT, OSMOLITE 1.5 CAL,) LIQD, Give 1 1/2 cartons of formula 4 times per day via feeding tube (8am, noon, 4pm and 8pm). Flush with 39ml of water before and after each feeding. Drink or give additional 242ml water TID between feedings via tube for better hydration., Disp: 1422 mL, Rfl: 0 .  ondansetron (ZOFRAN) 8 MG tablet, Take 1 tablet (8 mg total) by mouth 2 (two) times daily as needed. Start on the third day after cisplatin chemotherapy., Disp: 30 tablet, Rfl: 1 .  pantoprazole (PROTONIX) 20 MG tablet, Take 1 tablet (20 mg total) by mouth daily., Disp: 30 tablet, Rfl: 1 .  prochlorperazine (COMPAZINE) 10 MG  tablet, Take 1 tablet (10 mg total) by mouth every 6 (six) hours as needed (Nausea or vomiting)., Disp: 30 tablet, Rfl: 1 .  amoxicillin-clavulanate (AUGMENTIN) 875-125 MG tablet, Take 1 tablet by mouth 2 (two) times daily., Disp: 14 tablet, Rfl: 0 .  fentaNYL (DURAGESIC) 12 MCG/HR, Place 1 patch onto the skin every 3 (three) days. This is additonal to the 25 mcg to make 37 mcg total, Disp: 10 patch, Rfl: 0 .  fentaNYL (DURAGESIC) 25 MCG/HR, Place 1 patch onto the skin every 3 (three) days. This is additonal to the12 mcg to make 37 mcg total, Disp: 10 patch, Rfl: 0 .  fluconazole (DIFLUCAN) 100 MG tablet, Take 1 tablet (100 mg total) by mouth daily., Disp: 7 tablet, Rfl: 0 .  LORazepam (ATIVAN) 0.5 MG tablet, Take 1 tablet (0.5 mg total) by mouth every 8 (eight) hours., Disp: 60 tablet, Rfl: 0 .  nystatin (MYCOSTATIN) 100000 UNIT/ML suspension, Take 5 mLs (500,000 Units total) by mouth 4 (four) times daily., Disp: 473 mL, Rfl: 1 .  Oxycodone HCl 20 MG TABS, Take 1 tablet (20 mg total) by mouth every 4 (four) hours as  needed., Disp: 180 tablet, Rfl: 0 .  sertraline (ZOLOFT) 50 MG tablet, Take 1 tablet (50 mg total) by mouth daily. (Patient not taking: No sig reported), Disp: 30 tablet, Rfl: 2  Current Facility-Administered Medications:  .  heparin lock flush 100 unit/mL, 500 Units, Intravenous, Once, Sindy Guadeloupe, MD .  sodium chloride flush (NS) 0.9 % injection 10 mL, 10 mL, Intravenous, Once, Sindy Guadeloupe, MD  Physical exam:  Vitals:   12/27/20 0841 12/27/20 0844  BP:  127/83  Pulse:  98  Resp:  16  Temp:  98.6 F (37 C)  Weight: 143 lb 14.4 oz (65.3 kg) 143 lb 14.4 oz (65.3 kg)  Height: 5\' 9"  (1.753 m) 5\' 9"  (1.753 m)   Physical Exam Constitutional:      General: He is not in acute distress. HENT:     Mouth/Throat:     Comments: Evidence of grade 2 mucositis.  There is also oral thrush noted in the oropharynx. Cardiovascular:     Rate and Rhythm: Normal rate and regular rhythm.      Heart sounds: Normal heart sounds.  Pulmonary:     Effort: Pulmonary effort is normal.     Breath sounds: Normal breath sounds.  Abdominal:     General: Bowel sounds are normal.     Palpations: Abdomen is soft.     Comments: PEG tube in place.  Evidence of her small 1 cm folliculitis with some pus drainage noted over the right buttock  Skin:    General: Skin is warm and dry.  Neurological:     Mental Status: He is alert and oriented to person, place, and time.      CMP Latest Ref Rng & Units 12/27/2020  Glucose 70 - 99 mg/dL 127(H)  BUN 6 - 20 mg/dL 27(H)  Creatinine 0.61 - 1.24 mg/dL 0.77  Sodium 135 - 145 mmol/L 130(L)  Potassium 3.5 - 5.1 mmol/L 4.2  Chloride 98 - 111 mmol/L 94(L)  CO2 22 - 32 mmol/L 29  Calcium 8.9 - 10.3 mg/dL 8.9  Total Protein 6.5 - 8.1 g/dL -  Total Bilirubin 0.3 - 1.2 mg/dL -  Alkaline Phos 38 - 126 U/L -  AST 15 - 41 U/L -  ALT 0 - 44 U/L -   CBC Latest Ref Rng & Units 12/27/2020  WBC 4.0 - 10.5 K/uL 10.2  Hemoglobin 13.0 - 17.0 g/dL 11.4(L)  Hematocrit 39.0 - 52.0 % 31.1(L)  Platelets 150 - 400 K/uL 177    No images are attached to the encounter.  DG ABDOMEN PEG TUBE LOCATION  Result Date: 12/09/2020 CLINICAL DATA:  Gastrostomy catheter placement EXAM: ABDOMEN - 1 VIEW COMPARISON:  PET-CT October 14, 2020 FINDINGS: Contrast was administered into a gastrostomy catheter. Contrast flows through the gastrostomy catheter into the stomach. Contrast flows from the stomach into the duodenum. No contrast extravasation. There is fairly diffuse stool throughout the colon. There is no bowel dilatation or air-fluid level to suggest bowel obstruction. No free air. Lung bases are clear. IMPRESSION: Gastrostomy catheter position within the stomach. Contrast flows freely through the gastrostomy catheter in the stomach and from the stomach into the duodenum. No contrast extravasation. Diffuse stool in the colon may be indicative of underlying constipation. No  bowel obstruction or free air. Lung bases are clear. Electronically Signed   By: Lowella Grip III M.D.   On: 12/09/2020 14:52   DG REPLACE G-TUBE SIMPLE WO FLUORO  Result Date: 12/10/2020 INDICATION: 50 year old male  with history of oropharyngeal squamous cell carcinoma status post robotically assisted laparoscopic gastrostomy tube placement (Dr. Dahlia Byes, 11/29/20) referred by the surgery clinic for gastrostomy tube exchange in the setting of leakage from the indwelling gastrostomy. Radiograph from the prior day demonstrates appropriate position of the gastrostomy tube. EXAM: Gastrostomy tube exchange MEDICATIONS: None. ANESTHESIA/SEDATION: None. CONTRAST:  20 mL Omnipaque-administered into the gastric lumen. FLUOROSCOPY TIME:  Fluoroscopy Time: 0 minutes 9 seconds COMPLICATIONS: None immediate. PROCEDURE: Informed written consent was obtained from the patient after a thorough discussion of the procedural risks, benefits and alternatives. All questions were addressed. Maximal Sterile Barrier Technique was utilized including caps, mask, sterile gowns, sterile gloves, sterile drape, hand hygiene and skin antiseptic. A timeout was performed prior to the initiation of the procedure. Hand injection of contrast via the indwelling tube confirmed appropriate position within the gastric lumen. An Amplatz wire was inserted and coiled within the stomach. The indwelling balloon was aspirated until totally deflated. The gastrostomy was removed over the wire. A new, 20 French balloon retention gastrostomy tube was then inserted over the wire and the balloon was inflated with 10 mL of sterile water. The wire was removed and gentle hand injection of contrast demonstrated per position within the stomach. The bumper was cinched down to the skin. The patient tolerated the procedure well without immediate complication. IMPRESSION: Successful upsize of indwelling 18 French gastrostomy tube to 20 French gastrostomy tube. Ruthann Cancer, MD [facility] Electronically Signed   By: Ruthann Cancer MD   On: 12/10/2020 14:18     Assessment and plan- Patient is a 50 y.o. male with stage IHPV positive squamous cell carcinoma of the oropharynx/left base of tongue T2 N1 M0. He is here for on treatment assessment prior to cycle 6 of weekly cisplatin chemotherapy  Patient has received 5 cycles of weekly cisplatin chemotherapy so far.  He only received 2 days of radiation last week due to worsening mucositis.  He has already had treatment break in between when he tested positive for Covid.  Due to his ongoing mucositis and interruptions in his treatment I will plan to withhold any further chemotherapy at this time.  He will complete the rest of his treatments with radiation alone  Radiation mucositis: Continue as needed oxycodone.  Increase fentanyl patch from 25 mcg to 37 mcg.  Continue gabapentin and Magic mouthwash  Oral thrush: He has already received Diflucan from radiation oncology.  Also continue nystatin swish and swallow.  Folliculitis over the right buttock: There may be a small abscess in that area.  There is a passpointing with some drainage.  I will start him on Augmentin for 7 days and reassess this in 1 week's time if there would be need for surgical drainage.  Hyponatremia: Have encouraged the patient to use type II for Pedialyte versus Gatorade to get enough electrolytes.  Protein calorie malnutrition: He follows up with nutrition.  He is currently using 6-8 Osmolite per day via his PEG tube   Visit Diagnosis 1. Hyponatremia   2. Squamous cell carcinoma of oropharynx ([place])   3. Abnormal weight loss   4. Mucositis due to radiation therapy      Dr. Randa Evens, MD, MPH [facility] [phone] 12/27/2020 10:18 AM

## 2020-12-27 NOTE — Progress Notes (Signed)
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  Patient chemotherapy cancelled for today and stopped overall per patient.  Radiation to continue.   Called patient via phone as infusion cancelled.  Patient was able to pick up 4 cases of osmolite 1.5 today that RD left for him.  Patient has called [employer] and will be shipping 6 cases to him.  Patient reports that for the last 3 days has been taking 8 cartons of osmolite 1.5, 2 cartons QID.  Patient reports some mild stomach upset with osmolite but not bad and manageable. Unable to take fluids due to white patches on back of throat and painful swallowing.    Medications: reviewed  Labs: Na 130  Anthropometrics:   Weight 143 lb 14.4 oz today  140 lb 6 oz on 3/7 145 lb 8 oz on 2/28 145 lb on 1/31   Estimated Energy Needs  Kcals: 1980-2300 Protein: 99-115 g Fluid: > 2 L  NUTRITION DIAGNOSIS: Predicted suboptimal energy intake continues   INTERVENTION:  Patient says that he can tolerate 8 cartons of osmolite 1.5 (2 cartons QID). Flush 60 ml of water before and after each feeding. Patient reports MD prescribed pedialyte 1000 ml between feedings via tube for additional hydration with electrolytes (low Na). Patient will pick up today.  Tube feeding regimen provides 2840 calories, 119 g protein 2933ml fluid (flush, free water from formula and pedialyte) Patient has contact information    MONITORING, EVALUATION, GOAL: weight trends, tube feeding   NEXT VISIT: Monday, March 21 after radiation  Rynell Ciotti B. Zenia Resides, [place], [place] Registered Dietitian [phone] (mobile)

## 2020-12-28 ENCOUNTER — Ambulatory Visit: Payer: Medicaid Other

## 2020-12-29 ENCOUNTER — Ambulatory Visit: Payer: Medicaid Other

## 2020-12-30 ENCOUNTER — Telehealth: Payer: Self-pay | Admitting: Radiation Oncology

## 2020-12-30 ENCOUNTER — Ambulatory Visit: Payer: Medicaid Other

## 2020-12-30 NOTE — Telephone Encounter (Signed)
Patient states his throat is just as bad and would like to hold off on Radiation until Monday when he sees Dr. Janese Banks to determine next steps.  Forwarding to the team for follow up as needed.

## 2020-12-31 ENCOUNTER — Ambulatory Visit: Payer: Medicaid Other

## 2020-12-31 NOTE — Telephone Encounter (Signed)
I did call pt yest. And told him to come on in and if he is that bad then radiation md will make decision to hold or not. He still needs his labs see nutrition counselor and see Janese Banks. He wanted to know if we can change the appt time to later however there is several appts and we can't work it out to move all of them due to schedule is full and he is ok and will be here [place]

## 2021-01-03 ENCOUNTER — Inpatient Hospital Stay: Payer: Medicaid Other

## 2021-01-03 ENCOUNTER — Ambulatory Visit: Payer: Medicaid Other

## 2021-01-03 ENCOUNTER — Inpatient Hospital Stay: Payer: Medicaid Other | Admitting: Oncology

## 2021-01-03 NOTE — Progress Notes (Signed)
Nutrition Follow-up:  Patient with squamous cell carcinoma of oropharynx/left base of tongue, HPV+.  Patient continues with radiation.    No show for today's appointments.   Called patient to follow-up.  Says that he is on his way to [place] due to death in family and have to pick up children so they don't get in foster care.  Says that he is giving about 6-8 cartons of osmolite 1.5 via feeding tube.  Eating very little orally.  Says that he is drinking or giving via tube pedialyte as MD prescribed.  Says that over the last few days has had diarrhea, 4-5 watery stools.  Has not notified MD.   Patient anticipates being out of town for the week.    Medications: reviewed  Labs: reviewed  Anthropometrics:   No new weights   Estimated Energy Needs  Kcals: 1980-2300 Protein: 99-115 g Fluid: > 2 L  NUTRITION DIAGNOSIS: Predicted suboptimal energy intake continues   INTERVENTION:  Message sent to Dr Janese Banks and team regarding diarrhea.  RN called patient and spoke with him. Recommend patient try ensure plus, can buy in store vs osmolite to see if helps diarrhea. Patient verbalized understanding.  Ensure plus will provide (6-8 cartons/day) 2100-2800 calories, 78-104 g protein Patient has contact information    MONITORING, EVALUATION, GOAL: weight trends, intake, tube feeding tolerance   NEXT VISIT: Thursday, April 7th after radiation  Canden Cieslinski B. Zenia Resides, [place], [place] Registered Dietitian [phone] (mobile)

## 2021-01-04 ENCOUNTER — Other Ambulatory Visit: Payer: Medicaid Other

## 2021-01-04 ENCOUNTER — Ambulatory Visit: Payer: Medicaid Other

## 2021-01-04 ENCOUNTER — Ambulatory Visit: Payer: Medicaid Other | Admitting: Oncology

## 2021-01-05 ENCOUNTER — Ambulatory Visit: Payer: Medicaid Other

## 2021-01-06 ENCOUNTER — Ambulatory Visit: Payer: Medicaid Other

## 2021-01-06 ENCOUNTER — Other Ambulatory Visit: Payer: Self-pay | Admitting: Oncology

## 2021-01-07 ENCOUNTER — Ambulatory Visit: Payer: Medicaid Other

## 2021-01-10 ENCOUNTER — Ambulatory Visit: Payer: Medicaid Other

## 2021-01-11 ENCOUNTER — Ambulatory Visit: Payer: Medicaid Other

## 2021-01-11 ENCOUNTER — Ambulatory Visit
Admission: RE | Admit: 2021-01-11 | Discharge: 2021-01-11 | Disposition: A | Discharge status: Home / Self Care | Payer: Medicaid Other | Source: Ambulatory Visit | Attending: Radiation Oncology | Admitting: Radiation Oncology

## 2021-01-11 DIAGNOSIS — Z510 Encounter for antineoplastic radiation therapy: Secondary | ICD-10-CM | POA: Diagnosis present

## 2021-01-11 DIAGNOSIS — C109 Malignant neoplasm of oropharynx, unspecified: Secondary | ICD-10-CM | POA: Diagnosis present

## 2021-01-12 ENCOUNTER — Ambulatory Visit: Payer: Medicaid Other

## 2021-01-12 ENCOUNTER — Telehealth: Payer: Self-pay | Admitting: Emergency Medicine

## 2021-01-12 NOTE — Telephone Encounter (Signed)
   Paul Hebert DOB: [birth year] MRN: [id number]   RIDER WAIVER AND RELEASE OF LIABILITY  For purposes of improving physical access to our facilities, [facility] is pleased to partner with third parties to provide [facility] patients or other authorized individuals the option of convenient, on-demand ground transportation services (the [place]") through use of the technology service that enables users to request on-demand ground transportation from independent third-party providers.  By opting to use and accept these [employer], I, the undersigned, hereby agree on behalf of myself, and on behalf of any minor child using the [employer] for whom I am the parent or legal guardian, as follows:  1. Government social research officer provided to me are provided by independent third-party transportation providers who are not [employer] or employees and who are unaffiliated with [employer]. 2. [facility] is neither a transportation carrier nor a common or public carrier. 3. [facility] has no control over the quality or safety of the transportation that occurs as a result of the [employer]. 4. [facility] cannot guarantee that any third-party transportation provider will complete any arranged transportation service. 5. [facility] makes no representation, warranty, or guarantee regarding the reliability, timeliness, quality, safety, suitability, or availability of any of the Transport Services or that they will be error free. 6. I fully understand that traveling by vehicle involves risks and dangers of serious bodily injury, including permanent disability, paralysis, and death. I agree, on behalf of myself and on behalf of any minor child using the Transport Services for whom I am the parent or legal guardian, that the entire risk arising out of my use of the [employer] remains solely with me, to the maximum extent permitted under applicable law. 7. The [employer] are provided "as is" and "as available." [facility] disclaims all representations and warranties, express, implied or statutory, not expressly set out in these terms, including the implied warranties of merchantability and fitness for a particular purpose. 8. I hereby waive and release [facility], its agents, employees, officers, directors, representatives, insurers, attorneys, assigns, successors, subsidiaries, and affiliates from any and all past, present, or future claims, demands, liabilities, actions, causes of action, or suits of any kind directly or indirectly arising from acceptance and use of the [employer]. 9. I further waive and release [facility] and its affiliates from all present and future liability and responsibility for any injury or death to persons or damages to property caused by or related to the use of the [employer]. 10. I have read this Waiver and Release of Liability, and I understand the terms used in it and their legal significance. This Waiver is freely and voluntarily given with the understanding that my right (as well as the right of any minor child for whom I am the parent or legal guardian using the [employer]) to legal recourse against [place] in connection with the [employer] is knowingly surrendered in return for use of these services.   I attest that I read the consent document to Paul Hebert, gave Mr. Dildine the opportunity to ask questions and answered the questions asked (if any). I affirm that Paul Hebert then provided consent for he's participation in this program.     Katy Apo

## 2021-01-13 ENCOUNTER — Ambulatory Visit: Payer: Medicaid Other

## 2021-01-13 ENCOUNTER — Ambulatory Visit
Admission: RE | Admit: 2021-01-13 | Discharge: 2021-01-13 | Disposition: A | Discharge status: Home / Self Care | Payer: Medicaid Other | Source: Ambulatory Visit | Attending: Radiation Oncology | Admitting: Radiation Oncology

## 2021-01-13 DIAGNOSIS — Z510 Encounter for antineoplastic radiation therapy: Secondary | ICD-10-CM | POA: Diagnosis not present

## 2021-01-14 ENCOUNTER — Inpatient Hospital Stay: Payer: Medicaid Other | Attending: Radiation Oncology

## 2021-01-14 ENCOUNTER — Ambulatory Visit
Admission: RE | Admit: 2021-01-14 | Discharge: 2021-01-14 | Disposition: A | Discharge status: Home / Self Care | Payer: Medicaid Other | Source: Ambulatory Visit | Attending: Radiation Oncology | Admitting: Radiation Oncology

## 2021-01-14 ENCOUNTER — Other Ambulatory Visit: Payer: Self-pay

## 2021-01-14 DIAGNOSIS — Z510 Encounter for antineoplastic radiation therapy: Secondary | ICD-10-CM | POA: Diagnosis present

## 2021-01-14 DIAGNOSIS — C109 Malignant neoplasm of oropharynx, unspecified: Secondary | ICD-10-CM | POA: Diagnosis present

## 2021-01-17 ENCOUNTER — Inpatient Hospital Stay: Payer: Medicaid Other

## 2021-01-17 ENCOUNTER — Ambulatory Visit
Admission: RE | Admit: 2021-01-17 | Discharge: 2021-01-17 | Disposition: A | Discharge status: Home / Self Care | Payer: Medicaid Other | Source: Ambulatory Visit | Attending: Radiation Oncology | Admitting: Radiation Oncology

## 2021-01-17 DIAGNOSIS — Z510 Encounter for antineoplastic radiation therapy: Secondary | ICD-10-CM | POA: Diagnosis not present

## 2021-01-18 ENCOUNTER — Ambulatory Visit
Admission: RE | Admit: 2021-01-18 | Discharge: 2021-01-18 | Disposition: A | Discharge status: Home / Self Care | Payer: Medicaid Other | Source: Ambulatory Visit | Attending: Radiation Oncology | Admitting: Radiation Oncology

## 2021-01-18 ENCOUNTER — Ambulatory Visit: Payer: Medicaid Other

## 2021-01-18 ENCOUNTER — Inpatient Hospital Stay: Payer: Medicaid Other

## 2021-01-18 ENCOUNTER — Telehealth: Payer: Self-pay | Admitting: Oncology

## 2021-01-18 DIAGNOSIS — Z510 Encounter for antineoplastic radiation therapy: Secondary | ICD-10-CM | POA: Diagnosis not present

## 2021-01-18 NOTE — Telephone Encounter (Signed)
Called patient to make him aware of appts made on 4/15 to have lab work and see Dr. Jorene Guest notified him of Lucianne Lei schedule change to accommodate for addition of labs.

## 2021-01-19 ENCOUNTER — Ambulatory Visit: Payer: Medicaid Other

## 2021-01-19 ENCOUNTER — Other Ambulatory Visit: Payer: Self-pay

## 2021-01-19 ENCOUNTER — Inpatient Hospital Stay: Payer: Medicaid Other

## 2021-01-19 ENCOUNTER — Ambulatory Visit
Admission: RE | Admit: 2021-01-19 | Discharge: 2021-01-19 | Disposition: A | Discharge status: Home / Self Care | Payer: Medicaid Other | Source: Ambulatory Visit | Attending: Radiation Oncology | Admitting: Radiation Oncology

## 2021-01-19 DIAGNOSIS — Z510 Encounter for antineoplastic radiation therapy: Secondary | ICD-10-CM | POA: Diagnosis not present

## 2021-01-20 ENCOUNTER — Other Ambulatory Visit: Payer: Self-pay | Admitting: Oncology

## 2021-01-20 ENCOUNTER — Inpatient Hospital Stay: Payer: Medicaid Other

## 2021-01-20 ENCOUNTER — Ambulatory Visit
Admission: RE | Admit: 2021-01-20 | Discharge: 2021-01-20 | Disposition: A | Discharge status: Home / Self Care | Payer: Medicaid Other | Source: Ambulatory Visit | Attending: Radiation Oncology | Admitting: Radiation Oncology

## 2021-01-20 ENCOUNTER — Other Ambulatory Visit: Payer: Self-pay | Admitting: *Deleted

## 2021-01-20 ENCOUNTER — Other Ambulatory Visit: Payer: Self-pay

## 2021-01-20 DIAGNOSIS — C109 Malignant neoplasm of oropharynx, unspecified: Secondary | ICD-10-CM

## 2021-01-20 DIAGNOSIS — R197 Diarrhea, unspecified: Secondary | ICD-10-CM

## 2021-01-20 DIAGNOSIS — Z510 Encounter for antineoplastic radiation therapy: Secondary | ICD-10-CM | POA: Diagnosis not present

## 2021-01-20 MED ORDER — LOPERAMIDE HCL 2 MG PO CAPS: 2.0000 mg | ORAL_CAPSULE | ORAL | 0 refills | Status: DC | PRN

## 2021-01-20 NOTE — Progress Notes (Signed)
Nutrition Follow-up:  Patient with squamous cell carcinoma of oropharynx/left base of tongue, HPV+.  Patient continues with radiation.   Met with patient today following radiation.  Says that his fiance left 25 days ago with all his money.  He has been giving about 2-3 osmolite 1.5 via tube and not more because of watery diarrhea.  Has not taken imodium as does not have any money.  Has not tried ensure plus via tube.  Says that he is eating like a bird. Yesterday ate stuffed chicken with asparagus, broth and tuna pasta.  Says that this am took 1 carton of osmolite 1.5 and ate grits and bacon and had diarrhea times 3 between 8 am and 9:15.     Medications: reviewed  Labs: no recent  Anthropometrics:   Weight 136 lb 1 oz on 4/5 in [facility]  143 lb 14.4 oz on 3/14 104 lb on 3/7 145 lb on 2/28 145 lb on 1/31   Estimated Energy Needs  Kcals: 1980-2300 Protein: 99-115 g Fluid: > 2 L  NUTRITION DIAGNOSIS: Predicted suboptimal energy intake continues   INTERVENTION:  Spoke with RN, Sherri and MD Dr Rao regarding patient's continued diarrhea.  MD recommended testing stool and RD escorted patient to registration for lab appointment today.  RN called [facility] regarding imodium and RD informed patient of this.   Goal would be for patient to diarrhea to be controlled and increase to 6 cartons per day until able to eat more foods by mouth to prevent further weight loss as patient is "eating like a bird"     MONITORING, EVALUATION, GOAL: weight trends, intake   NEXT VISIT: Thursday, April 14 after radiation   B. , RD, LDN Registered Dietitian [phone] (mobile)     

## 2021-01-20 NOTE — Progress Notes (Signed)
c diff

## 2021-01-21 ENCOUNTER — Inpatient Hospital Stay: Payer: Medicaid Other

## 2021-01-21 ENCOUNTER — Ambulatory Visit: Payer: Medicaid Other

## 2021-01-21 ENCOUNTER — Other Ambulatory Visit: Payer: Self-pay

## 2021-01-21 DIAGNOSIS — R197 Diarrhea, unspecified: Secondary | ICD-10-CM

## 2021-01-21 NOTE — Progress Notes (Unsigned)
Gi

## 2021-01-24 ENCOUNTER — Inpatient Hospital Stay: Payer: Medicaid Other

## 2021-01-24 ENCOUNTER — Ambulatory Visit: Payer: Medicaid Other

## 2021-01-24 ENCOUNTER — Other Ambulatory Visit: Payer: Self-pay | Admitting: *Deleted

## 2021-01-24 DIAGNOSIS — C109 Malignant neoplasm of oropharynx, unspecified: Secondary | ICD-10-CM

## 2021-01-25 ENCOUNTER — Ambulatory Visit: Payer: Medicaid Other

## 2021-01-25 ENCOUNTER — Inpatient Hospital Stay: Payer: Medicaid Other

## 2021-01-26 ENCOUNTER — Ambulatory Visit: Payer: Medicaid Other

## 2021-01-26 ENCOUNTER — Inpatient Hospital Stay: Payer: Medicaid Other

## 2021-01-27 ENCOUNTER — Other Ambulatory Visit: Payer: Self-pay

## 2021-01-27 ENCOUNTER — Inpatient Hospital Stay: Payer: Medicaid Other

## 2021-01-27 ENCOUNTER — Ambulatory Visit
Admission: RE | Admit: 2021-01-27 | Discharge: 2021-01-27 | Disposition: A | Discharge status: Home / Self Care | Payer: Medicaid Other | Source: Ambulatory Visit | Attending: Radiation Oncology | Admitting: Radiation Oncology

## 2021-01-27 ENCOUNTER — Emergency Department
Admission: EM | Admit: 2021-01-27 | Discharge: 2021-01-27 | Disposition: A | Discharge status: Home / Self Care | Payer: No Typology Code available for payment source | Attending: Emergency Medicine | Admitting: Emergency Medicine

## 2021-01-27 ENCOUNTER — Encounter: Payer: Self-pay | Admitting: Emergency Medicine

## 2021-01-27 DIAGNOSIS — Z85818 Personal history of malignant neoplasm of other sites of lip, oral cavity, and pharynx: Secondary | ICD-10-CM | POA: Insufficient documentation

## 2021-01-27 DIAGNOSIS — Z510 Encounter for antineoplastic radiation therapy: Secondary | ICD-10-CM | POA: Diagnosis not present

## 2021-01-27 DIAGNOSIS — F17200 Nicotine dependence, unspecified, uncomplicated: Secondary | ICD-10-CM | POA: Diagnosis not present

## 2021-01-27 DIAGNOSIS — F329 Major depressive disorder, single episode, unspecified: Secondary | ICD-10-CM | POA: Diagnosis present

## 2021-01-27 DIAGNOSIS — F321 Major depressive disorder, single episode, moderate: Secondary | ICD-10-CM

## 2021-01-27 DIAGNOSIS — F32A Depression, unspecified: Secondary | ICD-10-CM

## 2021-01-27 MED ORDER — ESCITALOPRAM OXALATE 10 MG PO TABS: 10.0000 mg | ORAL_TABLET | Freq: Every day | ORAL | 1 refills | Status: DC

## 2021-01-27 NOTE — Progress Notes (Signed)
Nutrition  RD planning to see patient today after radiation but sent to ED.    Lars Jeziorski B. Zenia Resides, [place], [place] Registered Dietitian [phone] (mobile)

## 2021-01-27 NOTE — ED Provider Notes (Signed)
[facility] Emergency Department Provider Note  ____________________________________________   I have reviewed the triage vital signs and the nursing notes.   HISTORY  Chief Complaint Psychiatric Evaluation   History limited by: Not Limited   HPI Paul Hebert is a 50 y.o. male who presents to the emergency department today because of concern for depression. Patient has had a lot of different life stressors. Denies any acute medical complaints.   Records reviewed. Per medical record review patient has a history of oropharynx cancer.   Past Medical History:  Diagnosis Date  . 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 Goals of care, counseling/discussion 10/18/2020    Past Surgical History:  Procedure Laterality Date  . PORTA CATH INSERTION N/A 10/25/2020   Procedure: PORTA CATH INSERTION;  Surgeon: Algernon Huxley, MD;  Location: [place] [facility];  Service: Cardiovascular;  Laterality: N/A;    Prior to Admission medications   Medication Sig Start Date End Date Taking? Authorizing Provider  amoxicillin-clavulanate (AUGMENTIN) 875-125 MG tablet Take 1 tablet by mouth 2 (two) times daily. 12/27/20   Sindy Guadeloupe, MD  COVID-19 mRNA vaccine, Pfizer, 30 MCG/0.3ML injection AS DIRECTED 11/02/20 11/02/21  Carlyle Basques, MD  dexamethasone (DECADRON) 4 MG tablet Take 2 tablets (8 mg total) by mouth daily. Take daily x 3 days starting the day after cisplatin chemotherapy. Take with food. Patient taking differently: Take 8 mg by mouth See admin instructions. Take 8 mg daily x 3 days starting the day after cisplatin chemotherapy. Take with food. 11/01/20   Sindy Guadeloupe, MD  fentaNYL (DURAGESIC) 12 MCG/HR Place 1 patch onto the skin every 3 (three) days. This is additonal to the 25 mcg to make 37 mcg total 12/27/20   Sindy Guadeloupe, MD  fentaNYL (DURAGESIC) 25 MCG/HR  Place 1 patch onto the skin every 3 (three) days. This is additonal to the12 mcg to make 37 mcg total 12/27/20   Sindy Guadeloupe, MD  fluconazole (DIFLUCAN) 100 MG tablet Take 1 tablet (100 mg total) by mouth daily. 12/27/20   Noreene Filbert, MD  gabapentin (NEURONTIN) 300 MG capsule TAKE 1 CAPSULE (300 MG) BY MOUTH 3 TIMESDAILY 12/27/20   Sindy Guadeloupe, MD  lidocaine-prilocaine (EMLA) cream Apply to affected area once Patient taking differently: Apply 1 application topically daily as needed (port access). 11/01/20   Sindy Guadeloupe, MD  loperamide (IMODIUM) 2 MG capsule Take 1 capsule (2 mg total) by mouth as needed for diarrhea or loose stools (take 2 tab with first loose stool, then 1 tab. after every loose stool after that and maximum 8 a day). 01/20/21   Sindy Guadeloupe, MD  LORazepam (ATIVAN) 0.5 MG tablet Take 1 tablet (0.5 mg total) by mouth every 8 (eight) hours. 12/27/20   Sindy Guadeloupe, MD  Nutritional Supplements (FEEDING SUPPLEMENT, OSMOLITE 1.5 CAL,) LIQD Give 1 1/2 cartons of formula 4 times per day via feeding tube (8am, noon, 4pm and 8pm). Flush with 49ml of water before and after each feeding. Drink or give additional 238ml water TID between feedings via tube for better hydration. 11/04/20   Sindy Guadeloupe, MD  nystatin (MYCOSTATIN) 100000 UNIT/ML suspension Take 5 mLs (500,000 Units total) by mouth 4 (four) times daily. 12/27/20   Sindy Guadeloupe, MD  ondansetron (ZOFRAN) 8 MG tablet Take 1 tablet (8 mg total)  by mouth 2 (two) times daily as needed. Start on the third day after cisplatin chemotherapy. 12/06/20   Sindy Guadeloupe, MD  Oxycodone HCl 20 MG TABS Take 1 tablet (20 mg total) by mouth every 4 (four) hours as needed. 12/27/20   Sindy Guadeloupe, MD  pantoprazole (PROTONIX) 20 MG tablet TAKE 1 TABLET BY MOUTH DAILY. 01/06/21   Sindy Guadeloupe, MD  prochlorperazine (COMPAZINE) 10 MG tablet Take 1 tablet (10 mg total) by mouth every 6 (six) hours as needed (Nausea or vomiting). 12/06/20   Sindy Guadeloupe, MD  sertraline (ZOLOFT) 50 MG tablet Take 1 tablet (50 mg total) by mouth daily. Patient not taking: No sig reported 12/06/20   Sindy Guadeloupe, MD    Allergies Patient has no known allergies.  Family History  Problem Relation Age of Onset  . Cancer Father     Social History Social History   Tobacco Use  . Smoking status: Current Every Day Smoker    Packs/day: 0.50  . Smokeless tobacco: Never Used  Vaping Use  . Vaping Use: Never used  Substance Use Topics  . Alcohol use: Yes    Comment: once a week a beer or  2    Review of Systems Constitutional: No fever/chills Eyes: No visual changes. ENT: No sore throat. Cardiovascular: Denies chest pain. Respiratory: Denies shortness of breath. Gastrointestinal: No abdominal pain.  No nausea, no vomiting.  No diarrhea.   Genitourinary: Negative for dysuria. Musculoskeletal: Negative for back pain. Skin: Negative for rash. Neurological: Negative for headaches, focal weakness or numbness.  ____________________________________________   PHYSICAL EXAM:  VITAL SIGNS: ED Triage Vitals  Enc Vitals Group     BP 01/27/21 1058 (!) 151/129     Pulse Rate 01/27/21 1058 97     Resp 01/27/21 1058 20     Temp 01/27/21 1058 98.1 F (36.7 C)     Temp Source 01/27/21 1058 Oral     SpO2 01/27/21 1058 99 %     Weight 01/27/21 1058 140 lb (63.5 kg)     Height 01/27/21 1058 5\' 9"  (1.753 m)     Head Circumference --      Peak Flow --      Pain Score 01/27/21 1119 0   Constitutional: Alert and oriented.  Eyes: Conjunctivae are normal.  ENT      Head: Normocephalic and atraumatic.      Nose: No congestion/rhinnorhea.      Mouth/Throat: Mucous membranes are moist.      Neck: No stridor. Hematological/Lymphatic/Immunilogical: No cervical lymphadenopathy. Cardiovascular: Normal rate, regular rhythm.  No murmurs, rubs, or gallops.  Respiratory: Normal respiratory effort without tachypnea nor retractions. Breath sounds are clear  and equal bilaterally. No wheezes/rales/rhonchi. Gastrointestinal: Soft. Active bowel sounds.  Genitourinary: Deferred Musculoskeletal: Normal range of motion in all extremities. No lower extremity edema. Neurologic:  Normal speech and language. No gross focal neurologic deficits are appreciated.  Skin:  Skin is warm, dry and intact. No rash noted. Psychiatric: Depressed ____________________________________________    LABS (pertinent positives/negatives)  None  ____________________________________________   EKG  None  ____________________________________________    RADIOLOGY  None  ____________________________________________   PROCEDURES  Procedures  ____________________________________________   INITIAL IMPRESSION / ASSESSMENT AND PLAN / ED COURSE  Pertinent labs & imaging results that were available during my care of the patient were reviewed by me and considered in my medical decision making (see chart for details).   Patient presented to the emergency  department today because of concerns for depression.  Will have patient be seen by psychiatry.  ____________________________________________   FINAL CLINICAL IMPRESSION(S) / ED DIAGNOSES  Depression  Note: This dictation was prepared with Dragon dictation. Any transcriptional errors that result from this process are unintentional     Nance Pear, MD 01/27/21 1345

## 2021-01-27 NOTE — Consult Note (Signed)
Paul Hebert Psychiatry Consult   Reason for Consult: Consult for 50 year old man who came voluntarily to the emergency room seeking treatment for depression Referring Physician: Archie Hebert Patient Identification: Paul Hebert MRN:  [id number] Principal Diagnosis: Moderate major depression, single episode ([place]) Diagnosis:  Principal Problem:   Moderate major depression, single episode ([place])   Total Time spent with patient: 1 hour  Subjective:   Paul Hebert is a 50 y.o. male patient admitted with "I just need medicine to help my mood level out".  HPI: Patient seen and chart reviewed.  This 50 year old man who is currently receiving active treatment for cancer with radiation and chemotherapy is reporting depressed mood.  In addition to the cancer treatment and he also says that his fiance left him recently.  He is losing the place he has to stay soon.  Patient says his mood is feeling down and depressed most of the time and has been for several weeks.  Energy level is low.  Not eating well which is partially due to the radiation which burns his throat and requires the use of a feeding tube.  Sleep is okay.  Patient denies any psychotic symptoms.  He does say he feels very isolated and alone.  Does not have a family he is very close to.  Has little activity during the day especially since he is not currently able to work.  Admits that he has had suicidal thoughts but denies any plan or intention of acting on it.  He is currently living by himself above a garage but says he will lose that option soon.  He is drinking 2-3 times a week.  He says he is drinking less than he used to.  He is slightly evasive about it and it does raise some concern as to whether that is contributing but he feels it is not an active problem.  Denies other drug use.  Not currently receiving any psychiatric treatment  Past Psychiatric History: The patient reports that his cancer doctor tried to start him on Zoloft  sometime ago and that it made him feel worse and feel like his mood was going up and down rapidly.  That is the only treatment for depression he has ever had.  He does not report ever however having had a manic episode or psychiatric hospitalization or contact with a psychiatrist or any diagnosis of bipolar disorder  Risk to Self:   Risk to Others:   Prior Inpatient Therapy:   Prior Outpatient Therapy:    Past Medical History:  Past Medical History:  Diagnosis Date  . 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 PORTA CATH INSERTION N/A 10/25/2020   Procedure: PORTA CATH INSERTION;  Surgeon: Algernon Huxley, MD;  Location: [place] [facility];  Service: Cardiovascular;  Laterality: N/A;   Family History:  Family History  Problem Relation Age of Onset  . Cancer Father    Family Psychiatric  History: Does not know of any Social History:  Social History   Substance and Sexual Activity  Alcohol Use Yes   Comment: once a week a beer or  2     Social History   Substance and Sexual Activity  Drug Use Not on file    Social History   Socioeconomic History  . Marital status: Single    Spouse name: Not on file  . Number of children: Not on file  . Years of education: Not on file  .  Highest education level: Not on file  Occupational History  . Not on file  Tobacco Use  . Smoking status: Current Every Day Smoker    Packs/day: 0.50  . Smokeless tobacco: Never Used  Vaping Use  . Vaping Use: Never used  Substance and Sexual Activity  . Alcohol use: Yes    Comment: once a week a beer or  2  . Drug use: Not on file  . Sexual activity: Not Currently  Other Topics Concern  . Not on file  Social History Narrative  . Not on file   Social Determinants of Health   Financial Resource Strain: Not on file  Food Insecurity: Not on file  Transportation Needs: Not on file  Physical Activity: Not on file  Stress: Not on file  Social Connections: Not  on file   Additional Social History:    Allergies:  No Known Allergies  Labs: No results found for this or any previous visit (from the past 48 hour(s)).  No current facility-administered medications for this encounter.   Current Outpatient Medications  Medication Sig Dispense Refill  . escitalopram (LEXAPRO) 10 MG tablet Take 1 tablet (10 mg total) by mouth daily. Take half pill per day for 5 days then increase to one pill per day 30 tablet 1  . amoxicillin-clavulanate (AUGMENTIN) 875-125 MG tablet Take 1 tablet by mouth 2 (two) times daily. 14 tablet 0  . COVID-19 mRNA vaccine, Pfizer, 30 MCG/0.3ML injection AS DIRECTED .3 mL 0  . dexamethasone (DECADRON) 4 MG tablet Take 2 tablets (8 mg total) by mouth daily. Take daily x 3 days starting the day after cisplatin chemotherapy. Take with food. (Patient taking differently: Take 8 mg by mouth See admin instructions. Take 8 mg daily x 3 days starting the day after cisplatin chemotherapy. Take with food.) 30 tablet 1  . fentaNYL (DURAGESIC) 12 MCG/HR Place 1 patch onto the skin every 3 (three) days. This is additonal to the 25 mcg to make 37 mcg total 10 patch 0  . fentaNYL (DURAGESIC) 25 MCG/HR Place 1 patch onto the skin every 3 (three) days. This is additonal to the12 mcg to make 37 mcg total 10 patch 0  . fluconazole (DIFLUCAN) 100 MG tablet Take 1 tablet (100 mg total) by mouth daily. 7 tablet 0  . gabapentin (NEURONTIN) 300 MG capsule TAKE 1 CAPSULE (300 MG) BY MOUTH 3 TIMESDAILY 90 capsule 2  . lidocaine-prilocaine (EMLA) cream Apply to affected area once (Patient taking differently: Apply 1 application topically daily as needed (port access).) 30 g 3  . loperamide (IMODIUM) 2 MG capsule Take 1 capsule (2 mg total) by mouth as needed for diarrhea or loose stools (take 2 tab with first loose stool, then 1 tab. after every loose stool after that and maximum 8 a day). 48 capsule 0  . LORazepam (ATIVAN) 0.5 MG tablet Take 1 tablet (0.5 mg  total) by mouth every 8 (eight) hours. 60 tablet 0  . Nutritional Supplements (FEEDING SUPPLEMENT, OSMOLITE 1.5 CAL,) LIQD Give 1 1/2 cartons of formula 4 times per day via feeding tube (8am, noon, 4pm and 8pm). Flush with 80ml of water before and after each feeding. Drink or give additional 218ml water TID between feedings via tube for better hydration. 1422 mL 0  . nystatin (MYCOSTATIN) 100000 UNIT/ML suspension Take 5 mLs (500,000 Units total) by mouth 4 (four) times daily. 473 mL 1  . ondansetron (ZOFRAN) 8 MG tablet Take 1 tablet (8 mg total) by  mouth 2 (two) times daily as needed. Start on the third day after cisplatin chemotherapy. 30 tablet 1  . Oxycodone HCl 20 MG TABS Take 1 tablet (20 mg total) by mouth every 4 (four) hours as needed. 180 tablet 0  . pantoprazole (PROTONIX) 20 MG tablet TAKE 1 TABLET BY MOUTH DAILY. 30 tablet 1  . prochlorperazine (COMPAZINE) 10 MG tablet Take 1 tablet (10 mg total) by mouth every 6 (six) hours as needed (Nausea or vomiting). 30 tablet 1  . sertraline (ZOLOFT) 50 MG tablet Take 1 tablet (50 mg total) by mouth daily. (Patient not taking: No sig reported) 30 tablet 2    Musculoskeletal: Strength & Muscle Tone: within normal limits Gait & Station: normal Patient leans: N/A            Psychiatric Specialty Exam:  Presentation  General Appearance: No data recorded Eye Contact:No data recorded Speech:No data recorded Speech Volume:No data recorded Handedness:No data recorded  Mood and Affect  Mood:No data recorded Affect:No data recorded  Thought Process  Thought Processes:No data recorded Descriptions of Associations:No data recorded Orientation:No data recorded Thought Content:No data recorded History of Schizophrenia/Schizoaffective disorder:No data recorded Duration of Psychotic Symptoms:No data recorded Hallucinations:No data recorded Ideas of Reference:No data recorded Suicidal Thoughts:No data recorded Homicidal Thoughts:No  data recorded  Sensorium  Memory:No data recorded Judgment:No data recorded Insight:No data recorded  Executive Functions  Concentration:No data recorded Attention Span:No data recorded Recall:No data recorded Fund of Knowledge:No data recorded Language:No data recorded  Psychomotor Activity  Psychomotor Activity:No data recorded  Assets  Assets:No data recorded  Sleep  Sleep:No data recorded  Physical Exam: Physical Exam Vitals and nursing note reviewed.  Constitutional:      Appearance: Normal appearance.  HENT:     Head: Normocephalic and atraumatic.     Mouth/Throat:     Pharynx: Oropharynx is clear.  Eyes:     Pupils: Pupils are equal, round, and reactive to light.  Cardiovascular:     Rate and Rhythm: Normal rate and regular rhythm.  Pulmonary:     Effort: Pulmonary effort is normal.     Breath sounds: Normal breath sounds.  Abdominal:     General: Abdomen is flat.     Palpations: Abdomen is soft.  Musculoskeletal:        General: Normal range of motion.  Skin:    General: Skin is warm and dry.  Neurological:     General: No focal deficit present.     Mental Status: He is alert. Mental status is at baseline.  Psychiatric:        Attention and Perception: Attention normal.        Mood and Affect: Mood is depressed.        Speech: Speech normal.        Behavior: Behavior is cooperative.        Thought Content: Thought content includes suicidal ideation. Thought content does not include suicidal plan.        Cognition and Memory: Cognition normal.        Judgment: Judgment normal.    Review of Systems  Constitutional: Positive for malaise/fatigue and weight loss.  HENT: Negative.   Eyes: Negative.   Respiratory: Negative.   Cardiovascular: Negative.   Gastrointestinal: Negative.   Musculoskeletal: Negative.   Skin: Negative.   Neurological: Negative.   Psychiatric/Behavioral: Positive for depression and suicidal ideas. Negative for  hallucinations, memory loss and substance abuse. The patient has insomnia. The patient is not  nervous/anxious.    Blood pressure (!) 145/87, pulse 86, temperature 98.1 F (36.7 C), temperature source Oral, resp. rate 19, height 5\' 9"  (1.753 m), weight 63.5 kg, SpO2 99 %. Body mass index is 20.67 kg/m.  Treatment Plan Summary: Medication management and Plan 50 year old man reporting multiple symptoms of major depression in the context of active cancer treatment as well as several major stresses.  1 complicating concern is his description of having mood swings when he took Zoloft in the past.  Raises concern a little bit about possible bipolar disorder but he does not otherwise have any history of her criteria for bipolar disorder.  Also a little concerned about whether alcohol use could be contributing to mood.  I suggested to patient that we could try starting another antidepressant.  To be cautious I would like him to start Lexapro 5 mg a day for 5 days and then increase to 10 mg.  Typical side effects reviewed.  Patient advised that if his mood starts to feel much worse or there are side effects that are concerning he can discontinue it and should follow up with the cancer providers.  Case reviewed with emergency room doctor.  Encouraged patient to keep alcohol use under control or discontinue entirely.  Prescription provided.  Disposition: No evidence of imminent risk to self or others at present.   Patient does not meet criteria for psychiatric inpatient admission. Supportive therapy provided about ongoing stressors. Discussed crisis plan, support from social network, calling 911, coming to the Emergency Department, and calling Suicide Hotline.  Alethia Berthold, MD 01/27/2021 5:56 PM

## 2021-01-27 NOTE — Discharge Instructions (Addendum)
Please seek medical attention for any high fevers, chest pain, shortness of breath, change in behavior, persistent vomiting, bloody stool or any other new or concerning symptoms.  

## 2021-01-27 NOTE — BH Assessment (Signed)
Comprehensive Clinical Assessment (CCA) Screening, Triage and Referral Note  01/27/2021 Paul Hebert [id number]   Chief Complaint:  Chief Complaint  Patient presents with  . Psychiatric Evaluation   Visit Diagnosis: Depression  Patient Reported Information How did you hear about [place]? No data recorded  Referral name: Self   Referral phone number: No data recorded Whom do you see for routine medical problems? No data recorded  Practice/Facility Name: No data recorded  Practice/Facility Phone Number: No data recorded  Name of Contact: No data recorded  Contact Number: No data recorded  Contact Fax Number: No data recorded  Prescriber Name: No data recorded  Prescriber Address (if known): No data recorded What Is the Reason for Your Visit/Call Today? Seeking medications for depression  How Long Has This Been Causing You Problems? 1 wk - 1 month  Have You Recently Been in Any Inpatient Treatment (Hospital/Detox/Crisis Center/28-Day Program)? No   Name/Location of Program/Hospital:No data recorded  How Long Were You There? No data recorded  When Were You Discharged? No data recorded Have You Ever Received Services From [facility] Before? No   Who Do You See at [facility]? No data recorded Have You Recently Had Any Thoughts About Hurting Yourself? No   Are You Planning to Commit Suicide/Harm Yourself At This time?  No  Have you Recently Had Thoughts About [place]? No   Explanation: No data recorded Have You Used Any Alcohol or Drugs in the Past 24 Hours? No   How Long Ago Did You Use Drugs or Alcohol?  No data recorded  What Did You Use and How Much? No data recorded What Do You Feel Would Help You the Most Today? Treatment for Depression or other mood problem  Do You Currently Have a Therapist/Psychiatrist? No   Name of Therapist/Psychiatrist: No data recorded  Have You Been Recently Discharged From Any Office Practice or Programs? No   Explanation of  Discharge From Practice/Program:  No data recorded    CCA Screening Triage Referral Assessment Type of Contact: Face-to-Face   Is this Initial or Reassessment? No data recorded  Date Telepsych consult ordered in [facility]:  No data recorded  Time Telepsych consult ordered in [facility]:  No data recorded Patient Reported Information Reviewed? Yes   Patient Left Without Being Seen? No data recorded  Reason for Not Completing Assessment: No data recorded Collateral Involvement: n/a  Does Patient Have a Court Appointed Legal Guardian? No data recorded  Name and Contact of Legal Guardian:  No data recorded If Minor and Not Living with Parent(s), Who has Custody? No data recorded Is CPS involved or ever been involved? Never  Is APS involved or ever been involved? Never  Patient Determined To Be At Risk for Harm To Self or Others Based on Review of Patient Reported Information or Presenting Complaint? No   Method: No data recorded  Availability of Means: No data recorded  Intent: No data recorded  Notification Required: No data recorded  Additional Information for Danger to Others Potential:  No data recorded  Additional Comments for Danger to Others Potential:  No data recorded  Are There Guns or Other Weapons in Your Home?  No data recorded   Types of Guns/Weapons: No data recorded   Are These Weapons Safely Secured?                              No data recorded  Who Could Verify You Are Able To Have These Secured:    No data recorded Do You Have any Outstanding Charges, Pending Court Dates, Parole/Probation? No data recorded Contacted To Inform of Risk of Harm To Self or Others: Other: Comment  Location of Assessment: [facility] ED  Does Patient Present under Involuntary Commitment? No   IVC Papers Initial File Date: No data recorded  [place] of Residence: [place]  Patient Currently Receiving the Following Services: Not Receiving Services   Determination of Need: Emergent (2  hours)   Options For Referral: Outpatient Therapy  Gunnar Fusi MS, LCAS, [facility], [facility] Therapeutic Triage Specialist 01/27/2021 2:43 PM

## 2021-01-27 NOTE — ED Triage Notes (Signed)
Pt presents with c/o increasing depressed mood. Patient states he has had a lot happen in life recently including a separation from  Partner and recently beginning chemo treatments. Pt states he is interested in treatment to help with increasing sadness and depressive symptoms. Pt calm and cooperative. Pt denies SI/HI, AH/VH.

## 2021-01-27 NOTE — ED Notes (Signed)
E-signature not working at this time. Pt verbalized understanding of D/C instructions, prescriptions and follow up care with no further questions at this time. Pt in NAD and ambulatory at time of D/C. MD Clapacs gave patient paper prescription at time of discharge.

## 2021-01-28 ENCOUNTER — Encounter: Payer: Self-pay | Admitting: Emergency Medicine

## 2021-01-28 ENCOUNTER — Inpatient Hospital Stay: Payer: Medicaid Other

## 2021-01-28 ENCOUNTER — Ambulatory Visit
Admission: RE | Admit: 2021-01-28 | Discharge: 2021-01-28 | Disposition: A | Discharge status: Home / Self Care | Payer: Medicaid Other | Source: Ambulatory Visit | Attending: Radiation Oncology | Admitting: Radiation Oncology

## 2021-01-28 ENCOUNTER — Other Ambulatory Visit: Payer: Self-pay

## 2021-01-28 ENCOUNTER — Inpatient Hospital Stay: Payer: Medicaid Other | Admitting: Oncology

## 2021-01-28 ENCOUNTER — Emergency Department
Admission: EM | Admit: 2021-01-28 | Discharge: 2021-02-04 | Disposition: A | Discharge status: Home / Self Care | Payer: Medicaid Other | Attending: Surgery | Admitting: Surgery

## 2021-01-28 DIAGNOSIS — Z809 Family history of malignant neoplasm, unspecified: Secondary | ICD-10-CM | POA: Insufficient documentation

## 2021-01-28 DIAGNOSIS — F321 Major depressive disorder, single episode, moderate: Secondary | ICD-10-CM | POA: Diagnosis present

## 2021-01-28 DIAGNOSIS — Z79899 Other long term (current) drug therapy: Secondary | ICD-10-CM | POA: Insufficient documentation

## 2021-01-28 DIAGNOSIS — Z931 Gastrostomy status: Secondary | ICD-10-CM

## 2021-01-28 DIAGNOSIS — F1410 Cocaine abuse, uncomplicated: Secondary | ICD-10-CM | POA: Diagnosis not present

## 2021-01-28 DIAGNOSIS — Z20822 Contact with and (suspected) exposure to covid-19: Secondary | ICD-10-CM | POA: Insufficient documentation

## 2021-01-28 DIAGNOSIS — F1010 Alcohol abuse, uncomplicated: Secondary | ICD-10-CM | POA: Diagnosis not present

## 2021-01-28 DIAGNOSIS — Z7952 Long term (current) use of systemic steroids: Secondary | ICD-10-CM | POA: Insufficient documentation

## 2021-01-28 DIAGNOSIS — Z9221 Personal history of antineoplastic chemotherapy: Secondary | ICD-10-CM | POA: Insufficient documentation

## 2021-01-28 DIAGNOSIS — C099 Malignant neoplasm of tonsil, unspecified: Secondary | ICD-10-CM | POA: Diagnosis not present

## 2021-01-28 DIAGNOSIS — F17200 Nicotine dependence, unspecified, uncomplicated: Secondary | ICD-10-CM | POA: Diagnosis not present

## 2021-01-28 DIAGNOSIS — Z923 Personal history of irradiation: Secondary | ICD-10-CM | POA: Insufficient documentation

## 2021-01-28 DIAGNOSIS — Z95828 Presence of other vascular implants and grafts: Secondary | ICD-10-CM

## 2021-01-28 DIAGNOSIS — Z23 Encounter for immunization: Secondary | ICD-10-CM | POA: Diagnosis not present

## 2021-01-28 DIAGNOSIS — F32A Depression, unspecified: Secondary | ICD-10-CM | POA: Diagnosis present

## 2021-01-28 DIAGNOSIS — F322 Major depressive disorder, single episode, severe without psychotic features: Secondary | ICD-10-CM | POA: Diagnosis not present

## 2021-01-28 DIAGNOSIS — Z452 Encounter for adjustment and management of vascular access device: Secondary | ICD-10-CM | POA: Insufficient documentation

## 2021-01-28 DIAGNOSIS — R45851 Suicidal ideations: Secondary | ICD-10-CM

## 2021-01-28 DIAGNOSIS — Z431 Encounter for attention to gastrostomy: Secondary | ICD-10-CM | POA: Diagnosis not present

## 2021-01-28 DIAGNOSIS — Z510 Encounter for antineoplastic radiation therapy: Secondary | ICD-10-CM | POA: Diagnosis not present

## 2021-01-28 LAB — URINE DRUG SCREEN, QUALITATIVE (ARMC ONLY)
Amphetamines, Ur Screen: NOT DETECTED
Barbiturates, Ur Screen: NOT DETECTED
Benzodiazepine, Ur Scrn: NOT DETECTED
Cannabinoid 50 Ng, Ur ~~LOC~~: POSITIVE — AB
Cocaine Metabolite,Ur ~~LOC~~: POSITIVE — AB
MDMA (Ecstasy)Ur Screen: NOT DETECTED
Methadone Scn, Ur: NOT DETECTED
Opiate, Ur Screen: NOT DETECTED
Phencyclidine (PCP) Ur S: NOT DETECTED
Tricyclic, Ur Screen: NOT DETECTED

## 2021-01-28 LAB — COMPREHENSIVE METABOLIC PANEL
ALT: 19 U/L (ref 0–44)
AST: 27 U/L (ref 15–41)
Albumin: 4.4 g/dL (ref 3.5–5.0)
Alkaline Phosphatase: 64 U/L (ref 38–126)
Anion gap: 9 (ref 5–15)
BUN: 9 mg/dL (ref 6–20)
CO2: 30 mmol/L (ref 22–32)
Calcium: 9.3 mg/dL (ref 8.9–10.3)
Chloride: 98 mmol/L (ref 98–111)
Creatinine, Ser: 0.75 mg/dL (ref 0.61–1.24)
GFR, Estimated: >60 mL/min (ref >60)
Glucose, Bld: 103 mg/dL — ABNORMAL HIGH (ref 70–99)
Potassium: 4.6 mmol/L (ref 3.5–5.1)
Sodium: 137 mmol/L (ref 135–145)
Total Bilirubin: 0.8 mg/dL (ref 0.3–1.2)
Total Protein: 7.6 g/dL (ref 6.5–8.1)

## 2021-01-28 LAB — CBC
HCT: 39.4 % (ref 39.0–52.0)
Hemoglobin: 14.3 g/dL (ref 13.0–17.0)
MCH: 35.8 pg — ABNORMAL HIGH (ref 26.0–34.0)
MCHC: 36.3 g/dL — ABNORMAL HIGH (ref 30.0–36.0)
MCV: 98.5 fL (ref 80.0–100.0)
Platelets: 314 10*3/uL (ref 150–400)
RBC: 4 MIL/uL — ABNORMAL LOW (ref 4.22–5.81)
RDW: 15 % (ref 11.5–15.5)
WBC: 6.9 10*3/uL (ref 4.0–10.5)
nRBC: 0 % (ref 0.0–0.2)

## 2021-01-28 LAB — RESP PANEL BY RT-PCR (FLU A&B, COVID) ARPGX2
Influenza A by PCR: NEGATIVE
Influenza B by PCR: NEGATIVE
SARS Coronavirus 2 by RT PCR: NEGATIVE

## 2021-01-28 LAB — ACETAMINOPHEN LEVEL: Acetaminophen (Tylenol), Serum: <10 ug/mL — ABNORMAL LOW (ref 10–30)

## 2021-01-28 LAB — ETHANOL: Alcohol, Ethyl (B): <10 mg/dL (ref <10)

## 2021-01-28 LAB — SALICYLATE LEVEL: Salicylate Lvl: <7 mg/dL — ABNORMAL LOW (ref 7.0–30.0)

## 2021-01-28 NOTE — ED Notes (Signed)
Report from jeanette, rn.  

## 2021-01-28 NOTE — Progress Notes (Signed)
PSN called the patient this morning to talk about counseling services in the area.  Patient was tearful during the conversation.  He stated that he was not completely honest about how he was feeling yesterday.  He said he couldn't go on like this, and wanted to be committed to behavioral health.  After radiation today, PSN escorted patient to the emergency department for another psychiatric evaluation.

## 2021-01-28 NOTE — BH Assessment (Signed)
  Referral checks:   [facility] ([phone]), No answer   [facility] [phone] -[phone]) [phone] No answer   [facility] [phone]), No answer   [facility] [phone]) No answer   [facility] [phone] or [phone]) No answer   [facility] [phone]), Per Shanon Brow, referrals hadn't been reviewed. Advised TTS to check back at 8:15-8:30 AM.    [facility] [phone] -or- [phone]), Per Cleo, pt denied due to medical.    [facility] [phone] or [phone]), Per Rise Paganini, there is not an after hours intake nurse. Advised TTS to check back at 8 AM.    [facility] [phone]). No answer; no ability to leave voicemail.    [facility] [phone]) Per Lattie Haw, no beds available.    [facility] [phone]) Prescreening completed. Nicole Kindred requested a refax. Task completed at 2:06 AM.

## 2021-01-28 NOTE — ED Triage Notes (Signed)
Pt to ED via POV with c/o increasing depression at this time. Pt states he was seen yesterday and he lied. Pt states he has thoughts of hurting himself everyday but doesn't have the guts to do it. Pt states cocaine and ETOH abuse at this time. Pt states depression has caused him to neglect his cancer treatments.

## 2021-01-28 NOTE — ED Notes (Signed)
Pt dressed in burgundy scrubs by this tech. Pt belongings placed in bag are following: -brown wallet -cell phone -white handkerchief  -charger -chapstick -black sneakers -grey socks -black and red pants -grey shirt -black hat -gray bracelet for Port -red underwear -green long-sleeved shirt -black belt This tech counted 78 cents in change and placed the money in smaller bag, along with phone, wallet, charger, chapstick and bracelet. Pt also has brown pocket knife. Knife given to security.

## 2021-01-28 NOTE — Consult Note (Signed)
[facility] Face-to-Face Psychiatry Consult   Reason for Consult: Consult for 50 year old man who comes back to the emergency room second day in a row because of depression and suicidal ideation Referring Physician:  Cheri Fowler Patient Identification: Paul [facility]bert MRN:  [id number] Principal Diagnosis: Severe major depression, single episode ([place]) Diagnosis:  Principal Problem:   Severe major depression, single episode ([place]) Active Problems:   Cocaine abuse ([place])   Alcohol abuse   Total Time spent with patient: 1 hour  Subjective:   Paul [facility] is a 50 y.o. male patient admitted with "I need help so bad".  HPI: Patient seen chart reviewed.  50 year old man presents to the emergency room for the second day in a row.  Patient reports that he did not give the full picture of his depression yesterday his depression and bad mood have been severe for at least a couple weeks probably if couple weeks even longer than that.  Paul Hebert abandoned him about 3 or 4 weeks ago and he feels like it has been sharply downhill ever since then.  He has been drinking almost daily and using crack cocaine multiple times a week.  He has been skipping most of his appointments at the cancer center because of his drug abuse.  Not keeping his weight up.  Feeling hopeless.  Today he presents as tearful and expresses suicidal ideation with thoughts of overdosing.  He denies any psychotic symptoms.  Patient is asking for treatment and feels unsafe going home.  Past Psychiatric History: Today patient describes a situation differently saying that drugs and alcohol have been a problem throughout his life and that he has always struggled with him.  Sounds like he has never really been in any kind of appropriate substance abuse treatment.  As mentioned yesterday the only depression treatment was a brief trial of Zoloft that made him feel worse.  Risk to Self:   Risk to Others:   Prior Inpatient Therapy:   Prior Outpatient Therapy:     Past Medical History:  Past Medical History:  Diagnosis Date  . 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 PORTA CATH INSERTION N/A 10/25/2020   Procedure: PORTA CATH INSERTION;  Surgeon: Algernon Huxley, MD;  Location: [facility];  Service: Cardiovascular;  Laterality: N/A;   Family History:  Family History  Problem Relation Age of Onset  . Cancer Father    Family Psychiatric  History: See previous Social History:  Social History   Substance and Sexual Activity  Alcohol Use Yes   Comment: once a week a beer or  2     Social History   Substance and Sexual Activity  Drug Use Not on file    Social History   Socioeconomic History  . Marital status: Single    Spouse name: Not on file  . Number of children: Not on file  . Years of education: Not on file  . Highest education level: Not on file  Occupational History  . Not on file  Tobacco Use  . Smoking status: Current Every Day Smoker    Packs/day: 0.50  . Smokeless tobacco: Never Used  Vaping Use  . Vaping Use: Never used  Substance and Sexual Activity  . Alcohol use: Yes    Comment: once a week a beer or  2  . Drug use: Not on file  . Sexual activity: Not Currently  Other Topics Concern  . Not on file  Social History Narrative  . Not on file   Social Determinants of Health   Financial Resource Strain: Not on file  Food Insecurity: Not on file  Transportation Needs: Not on file  Physical Activity: Not on file  Stress: Not on file  Social Connections: Not on file   Additional Social History:    Allergies:  No Known Allergies  Labs:  Results for orders placed or performed during [name] encounter of 01/28/21 (from the past 48 hour(s))  Comprehensive metabolic panel     Status: Abnormal   Collection Time: 01/28/21 10:44 AM  Result Value Ref Range   Sodium 137 135 - 145 mmol/L   Potassium 4.6 3.5 - 5.1 mmol/L   Chloride 98 98 - 111 mmol/L   CO2 30 22  - 32 mmol/L   Glucose, Bld 103 (H) 70 - 99 mg/dL    Comment: Glucose reference range applies only to samples taken after fasting for at least 8 hours.   BUN 9 6 - 20 mg/dL   Creatinine, Ser 0.75 0.61 - 1.24 mg/dL   Calcium 9.3 8.9 - 10.3 mg/dL   Total Protein 7.6 6.5 - 8.1 g/dL   Albumin 4.4 3.5 - 5.0 g/dL   AST 27 15 - 41 U/L   ALT 19 0 - 44 U/L   Alkaline Phosphatase 64 38 - 126 U/L   Total Bilirubin 0.8 0.3 - 1.2 mg/dL   GFR, Estimated >60 >60 mL/min    Comment: (NOTE) Calculated using the CKD-EPI Creatinine Equation (2021)    Anion gap 9 5 - 15    Comment: Performed at [facility], [address] [place], [place] [address]  Ethanol     Status: None   Collection Time: 01/28/21 10:44 AM  Result Value Ref Range   Alcohol, Ethyl (B) <10 <10 mg/dL    Comment: (NOTE) Lowest detectable limit for serum alcohol is 10 mg/dL.  For medical purposes only. Performed at [facility], [address] [place], [place] [address]   Salicylate level     Status: Abnormal   Collection Time: 01/28/21 10:44 AM  Result Value Ref Range   Salicylate Lvl <1.2 (L) 7.0 - 30.0 mg/dL    Comment: Performed at [facility], [place], [place] [address]  Acetaminophen level     Status: Abnormal   Collection Time: 01/28/21 10:44 AM  Result Value Ref Range   Acetaminophen (Tylenol), Serum <10 (L) 10 - 30 ug/mL    Comment: (NOTE) Therapeutic concentrations vary significantly. A range of 10-30 ug/mL  may be an effective concentration for many patients. However, some  are best treated at concentrations outside of this range. Acetaminophen concentrations >150 ug/mL at 4 hours after ingestion  and >50 ug/mL at 12 hours after ingestion are often associated with  toxic reactions.  Performed at [facility], [address] [place], [place] [address]   cbc     Status: Abnormal   Collection Time: 01/28/21 10:44 AM  Result Value Ref Range   WBC 6.9 4.0  - 10.5 K/uL   RBC 4.00 (L) 4.22 - 5.81 MIL/uL   Hemoglobin 14.3 13.0 - 17.0 g/dL   HCT 39.4 39.0 - 52.0 %   MCV 98.5 80.0 - 100.0 fL   MCH 35.8 (H) 26.0 - 34.0 pg   MCHC 36.3 (H) 30.0 - 36.0 g/dL   RDW 15.0 11.5 - 15.5 %   Platelets 314 150 - 400 K/uL   nRBC  0.0 0.0 - 0.2 %    Comment: Performed at [facility], [address] [place], [place] [address]  Urine Drug Screen, Qualitative     Status: Abnormal   Collection Time: 01/28/21 10:44 AM  Result Value Ref Range   Tricyclic, Ur Screen NONE DETECTED NONE DETECTED   Amphetamines, Ur Screen NONE DETECTED NONE DETECTED   MDMA (Ecstasy)Ur Screen NONE DETECTED NONE DETECTED   Cocaine Metabolite,Ur [place] POSITIVE (A) NONE DETECTED   Opiate, Ur Screen NONE DETECTED NONE DETECTED   Phencyclidine (PCP) Ur S NONE DETECTED NONE DETECTED   Cannabinoid 50 Ng, Ur [place] POSITIVE (A) NONE DETECTED   Barbiturates, Ur Screen NONE DETECTED NONE DETECTED   Benzodiazepine, Ur Scrn NONE DETECTED NONE DETECTED   Methadone Scn, Ur NONE DETECTED NONE DETECTED    Comment: (NOTE) Tricyclics + metabolites, urine    Cutoff 1000 ng/mL Amphetamines + metabolites, urine  Cutoff 1000 ng/mL MDMA (Ecstasy), urine              Cutoff 500 ng/mL Cocaine Metabolite, urine          Cutoff 300 ng/mL Opiate + metabolites, urine        Cutoff 300 ng/mL Phencyclidine (PCP), urine         Cutoff 25 ng/mL Cannabinoid, urine                 Cutoff 50 ng/mL Barbiturates + metabolites, urine  Cutoff 200 ng/mL Benzodiazepine, urine              Cutoff 200 ng/mL Methadone, urine                   Cutoff 300 ng/mL  The urine drug screen provides only a preliminary, unconfirmed analytical test result and should not be used for non-medical purposes. Clinical consideration and professional judgment should be applied to any positive drug screen result due to possible interfering substances. A more specific alternate chemical method must be used in order to obtain a  confirmed analytical result. Gas chromatography / mass spectrometry (GC/MS) is the preferred confirm atory method. Performed at [facility], [address] [place], [place] [address]     No current facility-administered medications for this encounter.   Current Outpatient Medications  Medication Sig Dispense Refill  . amoxicillin-clavulanate (AUGMENTIN) 875-125 MG tablet Take 1 tablet by mouth 2 (two) times daily. 14 tablet 0  . COVID-19 mRNA vaccine, Pfizer, 30 MCG/0.3ML injection AS DIRECTED .3 mL 0  . dexamethasone (DECADRON) 4 MG tablet Take 2 tablets (8 mg total) by mouth daily. Take daily x 3 days starting the day after cisplatin chemotherapy. Take with food. (Patient taking differently: Take 8 mg by mouth See admin instructions. Take 8 mg daily x 3 days starting the day after cisplatin chemotherapy. Take with food.) 30 tablet 1  . escitalopram (LEXAPRO) 10 MG tablet Take 1 tablet (10 mg total) by mouth daily. Take half pill per day for 5 days then increase to one pill per day 30 tablet 1  . fentaNYL (DURAGESIC) 12 MCG/HR Place 1 patch onto the skin every 3 (three) days. This is additonal to the 25 mcg to make 37 mcg total 10 patch 0  . fentaNYL (DURAGESIC) 25 MCG/HR Place 1 patch onto the skin every 3 (three) days. This is additonal to the12 mcg to make 37 mcg total 10 patch 0  . fluconazole (DIFLUCAN) 100 MG tablet Take 1 tablet (100 mg total) by mouth daily. 7  tablet 0  . gabapentin (NEURONTIN) 300 MG capsule TAKE 1 CAPSULE (300 MG) BY MOUTH 3 TIMESDAILY 90 capsule 2  . lidocaine-prilocaine (EMLA) cream Apply to affected area once (Patient taking differently: Apply 1 application topically daily as needed (port access).) 30 g 3  . loperamide (IMODIUM) 2 MG capsule Take 1 capsule (2 mg total) by mouth as needed for diarrhea or loose stools (take 2 tab with first loose stool, then 1 tab. after every loose stool after that and maximum 8 a day). 48 capsule 0  . LORazepam  (ATIVAN) 0.5 MG tablet Take 1 tablet (0.5 mg total) by mouth every 8 (eight) hours. 60 tablet 0  . Nutritional Supplements (FEEDING SUPPLEMENT, OSMOLITE 1.5 CAL,) LIQD Give 1 1/2 cartons of formula 4 times per day via feeding tube (8am, noon, 4pm and 8pm). Flush with 79ml of water before and after each feeding. Drink or give additional 285ml water TID between feedings via tube for better hydration. 1422 mL 0  . nystatin (MYCOSTATIN) 100000 UNIT/ML suspension Take 5 mLs (500,000 Units total) by mouth 4 (four) times daily. 473 mL 1  . ondansetron (ZOFRAN) 8 MG tablet Take 1 tablet (8 mg total) by mouth 2 (two) times daily as needed. Start on the third day after cisplatin chemotherapy. 30 tablet 1  . Oxycodone HCl 20 MG TABS Take 1 tablet (20 mg total) by mouth every 4 (four) hours as needed. 180 tablet 0  . pantoprazole (PROTONIX) 20 MG tablet TAKE 1 TABLET BY MOUTH DAILY. 30 tablet 1  . prochlorperazine (COMPAZINE) 10 MG tablet Take 1 tablet (10 mg total) by mouth every 6 (six) hours as needed (Nausea or vomiting). 30 tablet 1  . sertraline (ZOLOFT) 50 MG tablet Take 1 tablet (50 mg total) by mouth daily. (Patient not taking: No sig reported) 30 tablet 2    Musculoskeletal: Strength & Muscle Tone: within normal limits Gait & Station: normal Patient leans: N/A            Psychiatric Specialty Exam:  Presentation  General Appearance: No data recorded Eye Contact:No data recorded Speech:No data recorded Speech Volume:No data recorded Handedness:No data recorded  Mood and Affect  Mood:No data recorded Affect:No data recorded  Thought Process  Thought Processes:No data recorded Descriptions of Associations:No data recorded Orientation:No data recorded Thought Content:No data recorded History of Schizophrenia/Schizoaffective disorder:No data recorded Duration of Psychotic Symptoms:No data recorded Hallucinations:No data recorded Ideas of Reference:No data recorded Suicidal  Thoughts:No data recorded Homicidal Thoughts:No data recorded  Sensorium  Memory:No data recorded Judgment:No data recorded Insight:No data recorded  Executive Functions  Concentration:No data recorded Attention Span:No data recorded Recall:No data recorded Fund of Knowledge:No data recorded Language:No data recorded  Psychomotor Activity  Psychomotor Activity:No data recorded  Assets  Assets:No data recorded  Sleep  Sleep:No data recorded  Physical Exam: Physical Exam Vitals and nursing note reviewed.  Constitutional:      Appearance: Normal appearance.  HENT:     Head: Normocephalic and atraumatic.     Mouth/Throat:     Pharynx: Oropharynx is clear.  Eyes:     Pupils: Pupils are equal, round, and reactive to light.  Cardiovascular:     Rate and Rhythm: Normal rate and regular rhythm.  Pulmonary:     Effort: Pulmonary effort is normal.     Breath sounds: Normal breath sounds.  Abdominal:     General: Abdomen is flat.     Palpations: Abdomen is soft.    Musculoskeletal:  General: Normal range of motion.  Skin:    General: Skin is warm and dry.       Neurological:     General: No focal deficit present.     Mental Status: He is alert. Mental status is at baseline.  Psychiatric:        Attention and Perception: Attention normal.        Mood and Affect: Mood is depressed. Affect is tearful.        Speech: Speech normal.        Behavior: Behavior is agitated. Behavior is not aggressive or hyperactive.        Thought Content: Thought content includes suicidal ideation. Thought content includes suicidal plan.        Cognition and Memory: Cognition normal.        Judgment: Judgment is impulsive.    Review of Systems  Constitutional: Negative.   HENT: Negative.   Eyes: Negative.   Respiratory: Negative.   Cardiovascular: Negative.   Gastrointestinal: Negative.   Musculoskeletal: Negative.   Skin: Negative.   Neurological: Negative.    Psychiatric/Behavioral: Positive for depression, substance abuse and suicidal ideas. Negative for hallucinations. The patient is nervous/anxious and has insomnia.    Blood pressure (!) 134/99, pulse 91, temperature 98.2 F (36.8 C), temperature source Oral, resp. rate 18, height 5\' 9"  (1.753 m), weight 63.5 kg, SpO2 99 %. Body mass index is 20.68 kg/m.  Treatment Plan Summary: Medication management and Plan 50 year old man is reporting depression that he presents as more severe today than yesterday.  Says after he was here yesterday he went home and continued to drink and use drugs.  Today he is tearful and voicing suicidal ideation and feels helpless and hopeless to go home.  He is asking for inpatient treatment.  This seems appropriate but a complication is the presence of his subclavian Port-A-Cath and his abdominal feeding tube.  I am checking with behavioral health nursing to see whether these will be prohibitive to his admission.  If not we can admit him downstairs but if those are not possible inpatient on our unit we will keep him in the emergency room and continue reevaluating while referring him out.  Disposition: Recommend psychiatric Inpatient admission when medically cleared. Supportive therapy provided about ongoing stressors.  Alethia Berthold, MD 01/28/2021 3:23 PM

## 2021-01-28 NOTE — ED Notes (Signed)
Patient shares "he is here for help because he has been neglecting himself and his chemo therapy, patient reports he feels like he is in a dark spot in his head", states he has been drinking and doing cocaine. Patient teary when expressing his story. 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

## 2021-01-28 NOTE — BH Assessment (Signed)
Comprehensive Clinical Assessment (CCA) Note  01/28/2021 Paul Hebert [id number]  Paul Hebert. [name] is a 50 year old male, who presents to the ER due to having thoughts of ending his life because of recent changes in his health, relationship and substance use. Patient has cancer and is having a difficult time dealing with it. His girlfriend left him and it has caused a financial strain for him and cause his depression to worsen. Due to the stressors, he has cope by using cocaine and alcohol. At this time, patient is unable to contract for safety, like was on yesterday (01/27/2021) when he came into the ER. He states, yesterday he wasn't completely honest and he doesn't know what he would do and it scares him.  During the interview, the patient was calm, cooperative and pleasant. He was able to provide appropriate answers to the question. Several time while answering questions, he became tearful. Throughout the interview, he denied HI and AV/H. He also denies history of aggression and violence.  Chief Complaint:  Chief Complaint  Patient presents with  . Psychiatric Evaluation  . Addiction Problem   Visit Diagnosis: Major Depression    CCA Screening, Triage and Referral (STR)  Patient Reported Information How did you hear about [place]? No data recorded Referral name: Self  Referral phone number: No data recorded  Whom do you see for routine medical problems? No data recorded Practice/Facility Name: No data recorded Practice/Facility Phone Number: No data recorded Name of Contact: No data recorded Contact Number: No data recorded Contact Fax Number: No data recorded Prescriber Name: No data recorded Prescriber Address (if known): No data recorded  What Is the Reason for Your Visit/Call Today? Having thoughts of ending his life  How Long Has This Been Causing You Problems? 1 wk - 1 month  What Do You Feel Would Help You the Most Today? Treatment for Depression or other mood problem;  Alcohol or Drug Use Treatment   Have You Recently Been in Any Inpatient Treatment (Hospital/Detox/Crisis Center/28-Day Program)? No  Name/Location of Program/Hospital:No data recorded How Long Were You There? No data recorded When Were You Discharged? No data recorded  Have You Ever Received Services From [facility] Before? Yes  Who Do You See at [facility]? Medical and Mental Health Treatment   Have You Recently Had Any Thoughts About Hurting Yourself? Yes  Are You Planning to Commit Suicide/Harm Yourself At This time? Yes   Have you Recently Had Thoughts About Hurting Someone Paul Hebert? No  Explanation: No data recorded  Have You Used Any Alcohol or Drugs in the Past 24 Hours? Yes  How Long Ago Did You Use Drugs or Alcohol? No data recorded What Did You Use and How Much? Alcohol and Cocaine   Do You Currently Have a Therapist/Psychiatrist? No  Name of Therapist/Psychiatrist: No data recorded  Have You Been Recently Discharged From Any Office Practice or Programs? No  Explanation of Discharge From Practice/Program: No data recorded    CCA Screening Triage Referral Assessment Type of Contact: Face-to-Face  Is this Initial or Reassessment? No data recorded Date Telepsych consult ordered in [facility]:  No data recorded Time Telepsych consult ordered in [facility]:  No data recorded  Patient Reported Information Reviewed? Yes  Patient Left Without Being Seen? No data recorded Reason for Not Completing Assessment: No data recorded  Collateral Involvement: n/a   Does Patient Have a Court Appointed Legal Guardian? No data recorded Name and Contact of Legal Guardian: No data recorded If Minor and  Not Living with Parent(s), Who has Custody? n/a  Is CPS involved or ever been involved? Never  Is APS involved or ever been involved? Never   Patient Determined To Be At Risk for Harm To Self or Others Based on Review of Patient Reported Information or Presenting Complaint?  No  Method: No data recorded Availability of Means: No data recorded Intent: No data recorded Notification Required: No data recorded Additional Information for Danger to Others Potential: No data recorded Additional Comments for Danger to Others Potential: No data recorded Are There Guns or Other Weapons in Your Home? No data recorded Types of Guns/Weapons: No data recorded Are These Weapons Safely Secured?                            No data recorded Who Could Verify You Are Able To Have These Secured: No data recorded Do You Have any Outstanding Charges, Pending Court Dates, Parole/Probation? No data recorded Contacted To Inform of Risk of Harm To Self or Others: Other: Comment   Location of Assessment: [facility] ED   Does Patient Present under Involuntary Commitment? No  IVC Papers Initial File Date: No data recorded  [place] of Residence: [place]   Patient Currently Receiving the Following Services: Not Receiving Services   Determination of Need: Emergent (2 hours)   Options For Referral: Inpatient Hospitalization     CCA Biopsychosocial Intake/Chief Complaint:  Increase symptoms of depression and having thoughts of ending his life  Current Symptoms/Problems: Thoughts of ending his life   Patient Reported Schizophrenia/Schizoaffective Diagnosis in Past: No   Strengths: Able to share what is bothering him.  Preferences: Help for his depression  Abilities: Able to care for his self   Type of Services Patient Feels are Needed: Inpatient treatment   Initial Clinical Notes/Concerns: Symptoms of depression   Mental Health Symptoms Depression:  Fatigue; Hopelessness; Increase/decrease in appetite; Sleep (too much or little)   Duration of Depressive symptoms: Greater than two weeks   Mania:  Change in energy/activity   Anxiety:   Difficulty concentrating; Restlessness; Worrying   Psychosis:  None   Duration of Psychotic symptoms: No data recorded  Trauma:   None   Obsessions:  None   Compulsions:  None   Inattention:  None   Hyperactivity/Impulsivity:  N/A   Oppositional/Defiant Behaviors:  None   Emotional Irregularity:  None   Other Mood/Personality Symptoms:  Depression    Mental Status Exam Appearance and self-care  Stature:  Average   Weight:  Thin   Clothing:  Neat/clean; Age-appropriate   Grooming:  Normal   Cosmetic use:  None   Posture/gait:  Normal   Motor activity:  -- (Within normal range)   Sensorium  Attention:  Normal   Concentration:  Normal   Orientation:  X5   Recall/memory:  Normal   Affect and Mood  Affect:  Anxious; Depressed   Mood:  Depressed; Anxious; Hopeless   Relating  Eye contact:  Normal   Facial expression:  Anxious; Depressed   Attitude toward examiner:  Cooperative   Thought and Language  Speech flow: Clear and Coherent; Normal   Thought content:  Appropriate to Mood and Circumstances   Preoccupation:  None   Hallucinations:  None   Organization:  No data recorded  [employer] of Knowledge:  Average   Intelligence:  Average   Abstraction:  Normal   Judgement:  Fair   Reality Testing:  Adequate  Insight:  Poor   Decision Making:  Normal   Social Functioning  Social Maturity:  Isolates   Social Judgement:  Normal   Stress  Stressors:  Grief/losses; Family conflict; Housing; Museum/gallery curator; Relationship; Transitions   Coping Ability:  Exhausted   Skill Deficits:  Communication   Supports:  Support needed     Religion: Religion/Spirituality Are You A Religious Person?: No  Leisure/Recreation: Leisure / Recreation Do You Have Hobbies?: No  Exercise/Diet: Exercise/Diet Do You Exercise?: No Have You Gained or Lost A Significant Amount of Weight in the Past Six Months?: No Do You Follow a Special Diet?: Yes Do You Have Any Trouble Sleeping?: No   CCA Employment/Education Employment/Work Situation: Employment / Work  Copywriter, advertising Employment situation: Unemployed What is the longest time patient has a held a job?: Unable to quantify Has patient ever been in the [employer]?: No  Education: Education Is Patient Currently Attending School?: No   CCA Family/Childhood History Family and Relationship History: Family history Marital status: Other (comment) Are you sexually active?: Yes What is your sexual orientation?: Heterosexual Has your sexual activity been affected by drugs, alcohol, medication, or emotional stress?: None noted Does patient have children?: No  Childhood History:  Childhood History By whom was/is the patient raised?: Both parents Additional childhood history information: None reported Patient's description of current relationship with people who raised him/her: Relationship with siblings is good How were you disciplined when you got in trouble as a child/adolescent?: Reports of no problems or concerns Does patient have siblings?: Yes Did patient suffer any verbal/emotional/physical/sexual abuse as a child?: No Did patient suffer from severe childhood neglect?: No Has patient ever been sexually abused/assaulted/raped as an adolescent or adult?: No Was the patient ever a victim of a crime or a disaster?: No Witnessed domestic violence?: No Has patient been affected by domestic violence as an adult?: No  Child/Adolescent Assessment:     CCA Substance Use Alcohol/Drug Use: Alcohol / Drug Use Pain Medications: See PTA Prescriptions: See PTA Over the Counter: See PTA History of alcohol / drug use?: Yes Substance #1 Name of Substance 1: Cocaine 1 - Last Use / Amount: 01/27/2021 Substance #2 Name of Substance 2: Alcohol 2 - Last Use / Amount: 01/27/2021    ASAM's:  Six Dimensions of Multidimensional Assessment  Dimension 1:  Acute Intoxication and/or Withdrawal Potential:      Dimension 2:  Biomedical Conditions and Complications:      Dimension 3:  Emotional,  Behavioral, or Cognitive Conditions and Complications:     Dimension 4:  Readiness to Change:     Dimension 5:  Relapse, Continued use, or Continued Problem Potential:     Dimension 6:  Recovery/Living Environment:     ASAM Severity Score:    ASAM Recommended Level of Treatment:     Substance use Disorder (SUD)    Recommendations for Services/Supports/Treatments:    DSM5 Diagnoses: Patient Active Problem List   Diagnosis Date Noted  . Cocaine abuse ([place]) 01/28/2021  . Alcohol abuse 01/28/2021  . Severe major depression, single episode ([place]) 01/28/2021  . 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 Goals of care, counseling/discussion 10/18/2020    Patient Centered Plan: Patient is on the following Treatment Plan(s):  Depression   Referrals to Alternative Service(s): Referred to Alternative Service(s):   Place:   Date:   Time:    Referred to Alternative Service(s):   Place:   Date:   Time:    Referred to Alternative Service(s):  Place:   Date:   Time:    Referred to Alternative Service(s):   Place:   Date:   Time:     Gunnar Fusi MS, LCAS, [facility], [facility] Therapeutic Triage Specialist 01/28/2021 5:26 PM

## 2021-01-28 NOTE — BH Assessment (Signed)
Referral information for Psychiatric Hospitalization faxed to;   [facility] [phone]),   . [facility] [phone] -or- [phone]) [phone]  . [place] ([phone]---[phone]),  . [facility] [phone])  . [place] [phone] or [phone])  . [facility] [phone]),   . [facility] [phone] -or- [phone]),   . [place] [phone] or [phone]),   . [facility] [phone]).  [facility] [phone])  . [facility] [phone])

## 2021-01-28 NOTE — ED Provider Notes (Signed)
[facility] Emergency Department Provider Note   ____________________________________________   Event Date/Time   First MD Initiated Contact with Patient 01/28/21 1053     (approximate)  I have reviewed the triage vital signs and the nursing notes.   HISTORY  Chief Complaint Psychiatric Evaluation and Addiction Problem    HPI Paul Hebert is a 50 y.o. male with a stated past medical history of polysubstance abuse for suicidal ideation.  Patient states that he has had worsening depression over the last 2-3 weeks in the setting of relapse of cocaine abuse.  Patient states that he does smoke crack cocaine and drink alcohol every day with last use being last night.  Patient denies any plan for suicide.  Patient currently denies any homicidal ideation, auditory/visual hallucinations,  vision changes, tinnitus, difficulty speaking, facial droop, sore throat, chest pain, shortness of breath, abdominal pain, nausea/vomiting/diarrhea, dysuria, or weakness/numbness/paresthesias in any extremity         Past Medical History:  Diagnosis Date  . 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 Cocaine abuse ([place]) 01/28/2021  . Alcohol abuse 01/28/2021  . Severe major depression, single episode ([place]) 01/28/2021  . 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 Goals of care, counseling/discussion 10/18/2020    Past Surgical History:  Procedure Laterality Date  . PORTA CATH INSERTION N/A 10/25/2020   Procedure: PORTA CATH INSERTION;  Surgeon: Algernon Huxley, MD;  Location: [facility];  Service: Cardiovascular;  Laterality: N/A;    Prior to Admission medications   Medication Sig Start Date End Date Taking? Authorizing Provider  amoxicillin-clavulanate (AUGMENTIN) 875-125 MG tablet Take 1 tablet by mouth 2 (two) times daily. 12/27/20   Sindy Guadeloupe, MD  COVID-19 mRNA vaccine, Pfizer, 30 MCG/0.3ML injection AS  DIRECTED 11/02/20 11/02/21  Carlyle Basques, MD  dexamethasone (DECADRON) 4 MG tablet Take 2 tablets (8 mg total) by mouth daily. Take daily x 3 days starting the day after cisplatin chemotherapy. Take with food. Patient taking differently: Take 8 mg by mouth See admin instructions. Take 8 mg daily x 3 days starting the day after cisplatin chemotherapy. Take with food. 11/01/20   Sindy Guadeloupe, MD  escitalopram (LEXAPRO) 10 MG tablet Take 1 tablet (10 mg total) by mouth daily. Take half pill per day for 5 days then increase to one pill per day 01/27/21 03/28/21  Clapacs, Madie Reno, MD  fentaNYL (DURAGESIC) 12 MCG/HR Place 1 patch onto the skin every 3 (three) days. This is additonal to the 25 mcg to make 37 mcg total 12/27/20   Sindy Guadeloupe, MD  fentaNYL (DURAGESIC) 25 MCG/HR Place 1 patch onto the skin every 3 (three) days. This is additonal to the12 mcg to make 37 mcg total 12/27/20   Sindy Guadeloupe, MD  fluconazole (DIFLUCAN) 100 MG tablet Take 1 tablet (100 mg total) by mouth daily. 12/27/20   Noreene Filbert, MD  gabapentin (NEURONTIN) 300 MG capsule TAKE 1 CAPSULE (300 MG) BY MOUTH 3 TIMESDAILY 12/27/20   Sindy Guadeloupe, MD  lidocaine-prilocaine (EMLA) cream Apply to affected area once Patient taking differently: Apply 1 application topically daily as needed (port access). 11/01/20   Sindy Guadeloupe, MD  loperamide (IMODIUM) 2 MG capsule Take 1 capsule (2 mg total) by mouth as needed for diarrhea or loose stools (take 2 tab with first loose stool, then 1 tab. after every loose stool after  that and maximum 8 a day). 01/20/21   Sindy Guadeloupe, MD  LORazepam (ATIVAN) 0.5 MG tablet Take 1 tablet (0.5 mg total) by mouth every 8 (eight) hours. 12/27/20   Sindy Guadeloupe, MD  Nutritional Supplements (FEEDING SUPPLEMENT, OSMOLITE 1.5 CAL,) LIQD Give 1 1/2 cartons of formula 4 times per day via feeding tube (8am, noon, 4pm and 8pm). Flush with 72ml of water before and after each feeding. Drink or give additional 250ml  water TID between feedings via tube for better hydration. 11/04/20   Sindy Guadeloupe, MD  nystatin (MYCOSTATIN) 100000 UNIT/ML suspension Take 5 mLs (500,000 Units total) by mouth 4 (four) times daily. 12/27/20   Sindy Guadeloupe, MD  ondansetron (ZOFRAN) 8 MG tablet Take 1 tablet (8 mg total) by mouth 2 (two) times daily as needed. Start on the third day after cisplatin chemotherapy. 12/06/20   Sindy Guadeloupe, MD  Oxycodone HCl 20 MG TABS Take 1 tablet (20 mg total) by mouth every 4 (four) hours as needed. 12/27/20   Sindy Guadeloupe, MD  pantoprazole (PROTONIX) 20 MG tablet TAKE 1 TABLET BY MOUTH DAILY. 01/06/21   Sindy Guadeloupe, MD  prochlorperazine (COMPAZINE) 10 MG tablet Take 1 tablet (10 mg total) by mouth every 6 (six) hours as needed (Nausea or vomiting). 12/06/20   Sindy Guadeloupe, MD  sertraline (ZOLOFT) 50 MG tablet Take 1 tablet (50 mg total) by mouth daily. Patient not taking: No sig reported 12/06/20   Sindy Guadeloupe, MD    Allergies Patient has no known allergies.  Family History  Problem Relation Age of Onset  . Cancer Father     Social History Social History   Tobacco Use  . Smoking status: Current Every Day Smoker    Packs/day: 0.50  . Smokeless tobacco: Never Used  Vaping Use  . Vaping Use: Never used  Substance Use Topics  . Alcohol use: Yes    Comment: once a week a beer or  2    Review of Systems Constitutional: No fever/chills Eyes: No visual changes. ENT: No sore throat. Cardiovascular: Denies chest pain. Respiratory: Denies shortness of breath. Gastrointestinal: No abdominal pain.  No nausea, no vomiting.  No diarrhea. Genitourinary: Negative for dysuria. Musculoskeletal: Negative for acute arthralgias Skin: Negative for rash. Neurological: Negative for headaches, weakness/numbness/paresthesias in any extremity Psychiatric:  Positive for suicidal ideation. negative for homicidal ideation   ____________________________________________   PHYSICAL  EXAM:  VITAL SIGNS: ED Triage Vitals  Enc Vitals Group     BP 01/28/21 1040 (!) 134/99     Pulse Rate 01/28/21 1040 91     Resp 01/28/21 1040 18     Temp 01/28/21 1040 98.2 F (36.8 C)     Temp Source 01/28/21 1040 Oral     SpO2 01/28/21 1040 99 %     Weight 01/28/21 1041 140 lb 1 oz (63.5 kg)     Height 01/28/21 1041 5\' 9"  (1.753 m)     Head Circumference --      Peak Flow --      Pain Score 01/28/21 1040 0     Pain Loc --      Pain Edu? --      Excl. in [place]? --    Constitutional: Alert and oriented. Well appearing and in no acute distress. Eyes: Conjunctivae are normal. PERRL. Head: Atraumatic. Nose: No congestion/rhinnorhea. Mouth/Throat: Mucous membranes are moist. Neck: No stridor Cardiovascular: Grossly normal heart sounds.  Good peripheral circulation.  Respiratory: Normal respiratory effort.  No retractions. Gastrointestinal: Soft and nontender. No distention. Musculoskeletal: No obvious deformities Neurologic:  Normal speech and language. No gross focal neurologic deficits are appreciated. Skin:  Skin is warm and dry. No rash noted. Psychiatric: Mood and affect are labile. Speech and behavior are normal.  ____________________________________________   LABS (all labs ordered are listed, but only abnormal results are displayed)  Labs Reviewed  COMPREHENSIVE METABOLIC PANEL - Abnormal; Notable for the following components:      Result Value   Glucose, Bld 103 (*)    All other components within normal limits  SALICYLATE LEVEL - Abnormal; Notable for the following components:   Salicylate Lvl <5.8 (*)    All other components within normal limits  ACETAMINOPHEN LEVEL - Abnormal; Notable for the following components:   Acetaminophen (Tylenol), Serum <10 (*)    All other components within normal limits  CBC - Abnormal; Notable for the following components:   RBC 4.00 (*)    MCH 35.8 (*)    MCHC 36.3 (*)    All other components within normal limits  URINE DRUG  SCREEN, QUALITATIVE ([facility] ONLY) - Abnormal; Notable for the following components:   Cocaine Metabolite,Ur [place] POSITIVE (*)    Cannabinoid 50 Ng, Ur [place] POSITIVE (*)    All other components within normal limits  RESP PANEL BY RT-PCR (FLU A&B, COVID) ARPGX2  ETHANOL   PROCEDURES  Procedure(s) performed (including Critical Care):  Procedures ____________________________________________   INITIAL IMPRESSION / ASSESSMENT AND PLAN / ED COURSE  As part of my medical decision making, I reviewed the following data within the [place] notes reviewed and incorporated, Labs reviewed, EKG interpreted, Old chart reviewed, Radiograph reviewed and Notes from prior ED visits reviewed and incorporated     Thoughts are linear and organized, and patient has no AH, VH, or HI. Prior suicide attempt: Denies Prior Psychiatric Hospitalizations: Multiple  Clinically patient displays no overt toxidrome; they are well appearing, with low suspicion for toxic ingestion given history and exam. Thoughts unlikely 2/2 anemia, hypothyroidism, infection, or ICH.  Consult: Psychiatry to evaluate patient for potential hold for danger to self. Disposition: Plan admit to psychiatry for further management of symptoms.        ____________________________________________   FINAL CLINICAL IMPRESSION(S) / ED DIAGNOSES  Final diagnoses:  Suicidal ideation     ED Discharge Orders    None       Note:  This document was prepared using Dragon voice recognition software and may include unintentional dictation errors.   Naaman Plummer, MD 01/30/21 [phone]

## 2021-01-28 NOTE — ED Notes (Signed)
Report to kim, rn.  

## 2021-01-28 NOTE — Progress Notes (Signed)
According to Radiation RN, patient has not been coming to his radiation therapy appointments.  Several attempts over the past few weeks have been made by radiation staff to contact him, but he did not answer his phone.  Messages were left by staff.  Yesterday, PSN and RN were able to reach patient by phone.  He reported that he has been feeling "very low", and needs some medication and someone to talk to about his feelings. He also reported that he is "struggling to move forward".  Patient agreed to come to cancer center for his radiation treatment.  Upon arrival, RN asked patient was he feeling like he wanted to harm himself, or was suicidal.  She stated patient said he was not feeling that way.  PSN met with patient.  Patient said he needed to contact his food stamps worker to get this resource back in place.  PSN contact food Conservation officer, historic buildings via phone.  Patient spoke with her and was able to get food stamps reinstated.  Patient agreed to go to Emergency Department for a behavior health evaluation.  Patient was escorted to the ED by PSN and was seen by someone in behavior health.   Community Survivorship Coordinator will call patient, today, to discuss various support groups offered at cancer center.  PSN will give patient resources about counseling services in the immediate area.

## 2021-01-29 MED ORDER — LORAZEPAM 1 MG PO TABS
1.0000 mg | ORAL_TABLET | Freq: Once | ORAL | Status: AC
  Administered 2021-01-29: 1 mg via ORAL
  Filled 2021-01-29: qty 1

## 2021-01-29 MED ORDER — NICOTINE 14 MG/24HR TD PT24
14.0000 mg | MEDICATED_PATCH | Freq: Every day | TRANSDERMAL | Status: DC
  Administered 2021-01-29 – 2021-02-04 (×7): 14 mg via TRANSDERMAL
  Filled 2021-01-29 (×7): qty 1

## 2021-01-29 NOTE — ED Notes (Signed)
Pt VOL/pending placement.

## 2021-01-29 NOTE — ED Notes (Signed)
Pt agreed to stay in the hospital.

## 2021-01-29 NOTE — ED Provider Notes (Signed)
Emergency Medicine Observation Re-evaluation Note  Paul Hebert is a 50 y.o. male, seen on rounds today.  Pt initially presented to the ED for complaints of Psychiatric Evaluation and Addiction Problem Currently, the patient is resting comfortably.  Physical Exam  BP 127/86 (BP Location: Left Arm)   Pulse 80   Temp 98.5 F (36.9 C) (Oral)   Resp 14   Ht 5\' 9"  (1.753 m)   Wt 63.5 kg   SpO2 99%   BMI 20.68 kg/m  Physical Exam General: nad Cardiac: well perfused Lungs: even and unlabored Psych: currently calm  ED Course / MDM  EKG:   I have reviewed the labs performed to date as well as medications administered while in observation.  Recent changes in the last 24 hours include none.  Plan  Current plan is for psych eval. Patient is not under full IVC at this time.   Merlyn Lot, MD 01/29/21 [phone]

## 2021-01-29 NOTE — ED Notes (Signed)
Pt given dinner tray.

## 2021-01-29 NOTE — ED Notes (Signed)
VS  will be taken once pt is awake.

## 2021-01-29 NOTE — ED Notes (Signed)
EDP at bedside  

## 2021-01-29 NOTE — ED Notes (Addendum)
Pt states he is no long drunk and not feeling suicidal.  Pt asking to go home. "I only feel that way when I have been drinking."  Pt is tearful and states he wants to get his life together. EDP made aware.

## 2021-01-29 NOTE — ED Notes (Signed)
Pt is taking a shower.

## 2021-01-30 MED ORDER — FENTANYL 12 MCG/HR TD PT72
1.0000 | MEDICATED_PATCH | TRANSDERMAL | Status: DC
  Administered 2021-01-30 – 2021-02-02 (×2): 1 via TRANSDERMAL
  Filled 2021-01-30 (×4): qty 1

## 2021-01-30 MED ORDER — PROCHLORPERAZINE MALEATE 10 MG PO TABS
10.0000 mg | ORAL_TABLET | Freq: Four times a day (QID) | ORAL | Status: DC | PRN
  Filled 2021-01-30: qty 1

## 2021-01-30 MED ORDER — FENTANYL 12 MCG/HR TD PT72
1.0000 | MEDICATED_PATCH | TRANSDERMAL | Status: DC
Start: 2021-01-30 — End: 2021-01-30

## 2021-01-30 MED ORDER — ESCITALOPRAM OXALATE 10 MG PO TABS
10.0000 mg | ORAL_TABLET | Freq: Every day | ORAL | Status: DC
  Administered 2021-01-30 – 2021-02-03 (×5): 10 mg via ORAL
  Filled 2021-01-30 (×6): qty 1

## 2021-01-30 MED ORDER — OXYCODONE HCL 5 MG PO TABS: 20.0000 mg | ORAL_TABLET | ORAL | Status: DC | PRN

## 2021-01-30 MED ORDER — GABAPENTIN 300 MG PO CAPS
300.0000 mg | ORAL_CAPSULE | Freq: Three times a day (TID) | ORAL | Status: DC
  Administered 2021-01-30 – 2021-02-04 (×17): 300 mg via ORAL
  Filled 2021-01-30 (×17): qty 1

## 2021-01-30 MED ORDER — LORAZEPAM 0.5 MG PO TABS
0.5000 mg | ORAL_TABLET | Freq: Three times a day (TID) | ORAL | Status: DC
  Administered 2021-01-30 – 2021-02-04 (×17): 0.5 mg via ORAL
  Filled 2021-01-30 (×18): qty 1

## 2021-01-30 MED ORDER — PANTOPRAZOLE SODIUM 20 MG PO TBEC
20.0000 mg | DELAYED_RELEASE_TABLET | Freq: Every day | ORAL | Status: DC
  Administered 2021-01-31 – 2021-02-03 (×4): 20 mg via ORAL
  Filled 2021-01-30 (×7): qty 1

## 2021-01-30 MED ORDER — FENTANYL 25 MCG/HR TD PT72: 1.0000 | MEDICATED_PATCH | TRANSDERMAL | Status: DC

## 2021-01-30 MED ORDER — FENTANYL 25 MCG/HR TD PT72
1.0000 | MEDICATED_PATCH | TRANSDERMAL | Status: DC
Start: 2021-01-30 — End: 2021-02-04
  Administered 2021-01-30 – 2021-02-02 (×2): 1 via TRANSDERMAL
  Filled 2021-01-30 (×3): qty 1

## 2021-01-30 MED ORDER — LOPERAMIDE HCL 2 MG PO CAPS
2.0000 mg | ORAL_CAPSULE | ORAL | Status: DC | PRN
  Filled 2021-01-30: qty 1

## 2021-01-30 MED ORDER — ONDANSETRON HCL 4 MG PO TABS: 8.0000 mg | ORAL_TABLET | Freq: Three times a day (TID) | ORAL | Status: DC | PRN

## 2021-01-30 NOTE — ED Notes (Signed)
Patient sleeping in no acute distress.

## 2021-01-30 NOTE — BH Assessment (Signed)
Referral checks   [facility] ([phone]- [phone]), Per Dian Situ no appropriate beds   [facility] [phone]) [phone] Per Renee, there are no intake staff available at this time.    [facility] [phone]), No answer   [facility] [phone]) Per Jimmie Molly, no beds available.   [place] [facility] [phone] or [phone]) Per Abigail Butts, the assessment team is not available at this time.    [place] [phone]),  Per Lamonte Sakai, fax received, however there won't be beds available until Monday 01/31/21.   [facility] [phone]- [phone]), Per Helyn App no beds are available until Monday 01/31/21.    [facility] [phone]). No answer; no ability to leave voicemail.    [facility] [phone]) Lovena Le requested a refax. Task completed 2:38 PM.    [facility] [phone]) Per Scot Dock, pt is still under review.

## 2021-01-30 NOTE — ED Provider Notes (Signed)
Emergency Medicine Observation Re-evaluation Note  Paul Hebert is a 50 y.o. male, seen on rounds today.  Pt initially presented to the ED for complaints of Psychiatric Evaluation and Addiction Problem Currently, the patient is resting comfortably.  Physical Exam  BP 130/88   Pulse 76   Temp 98.6 F (37 C) (Oral)   Resp 18   Ht 5\' 9"  (1.753 m)   Wt 63.5 kg   SpO2 97%   BMI 20.68 kg/m  Physical Exam Gen: No acute distress  Resp: Normal rise and fall of chest Neuro: Moving all four extremities Psych: Resting currently, calm and cooperative when awake    ED Course / MDM  EKG:   I have reviewed the labs performed to date as well as medications administered while in observation.  Recent changes in the last 24 hours include no acute events overnight.  Plan  Current plan is for psychiatric inpatient treatment. Patient is not under full IVC at this time.   Caitlan Chauca, Delice Bison, DO 01/30/21 [phone]

## 2021-01-30 NOTE — ED Notes (Signed)
Pt given shower supplies and is in shower

## 2021-01-30 NOTE — ED Notes (Signed)
Report to include Situation, Background, Assessment, and Recommendations received from [facility]. Patient alert and oriented, warm and dry, in no acute distress. Patient denies SI, HI, AVH and pain. Patient reported depression. Patient made aware of Q15 minute rounds and security cameras for their safety. Patient instructed to come to me with needs or concerns.

## 2021-01-30 NOTE — ED Notes (Signed)
Hourly rounding reveals patient in room. No complaints, stable, in no acute distress. Q15 minute rounds and monitoring via Security Cameras to continue. 

## 2021-01-30 NOTE — ED Notes (Signed)
Per pharmacy, waiting on 70mcg dose patch to come from another pharmacy.

## 2021-01-31 ENCOUNTER — Ambulatory Visit: Payer: Medicaid Other

## 2021-01-31 ENCOUNTER — Inpatient Hospital Stay: Payer: Medicaid Other

## 2021-01-31 NOTE — ED Notes (Signed)
Pt handed ice water. Calm and cooperative. Will continue to monitor pt.

## 2021-01-31 NOTE — ED Notes (Signed)
Hourly rounding reveals patient in room. No complaints, stable, in no acute distress. Q15 minute rounds and monitoring via Security Cameras to continue. 

## 2021-01-31 NOTE — ED Notes (Signed)
Patient is resting comfortably. 

## 2021-01-31 NOTE — Consult Note (Signed)
[facility] Face-to-Face Psychiatry Consult   Reason for Consult: Consult to follow-up on this 50 year old man with substance abuse and depression Referring Physician: Corky Downs Patient Identification: Paul Hebert MRN:  [id number] Principal Diagnosis: Severe major depression, single episode ([place]) Diagnosis:  Principal Problem:   Severe major depression, single episode ([place]) Active Problems:   Cocaine abuse ([place])   Alcohol abuse   Total Time spent with patient: 30 minutes  Subjective:   Paul Hebert is a 50 y.o. male patient admitted with "I need help".  HPI: This is a follow-up note for this 50 year old gentleman who has been in the emergency room for a couple days now.  He came back to the ER after initially presenting for depression saying that he had suicidal ideation and needed inpatient treatment.  Patient could not be admitted to our service because of the presence of his indwelling PICC line and his stomach tube.  Patient spoke with a representative from [facility] today and made it clear that his primary need was for a place to live because he is currently homeless.  Patient continues to endorse suicidal ideation without specific plan.  Anxious mood low energy.  Eating better.  No obvious psychosis  Past Psychiatric History: History of substance abuse problems  Risk to Self:   Risk to Others:   Prior Inpatient Therapy:   Prior Outpatient Therapy:    Past Medical History:  Past Medical History:  Diagnosis Date  . 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 PORTA CATH INSERTION N/A 10/25/2020   Procedure: PORTA CATH INSERTION;  Surgeon: Algernon Huxley, MD;  Location: [place] [facility];  Service: Cardiovascular;  Laterality: N/A;   Family History:  Family History  Problem Relation Age of Onset  . Cancer Father    Family Psychiatric  History: See previous Social History:  Social History   Substance and Sexual Activity  Alcohol Use Yes   Comment:  once a week a beer or  2     Social History   Substance and Sexual Activity  Drug Use Not on file    Social History   Socioeconomic History  . Marital status: Single    Spouse name: Not on file  . Number of children: Not on file  . Years of education: Not on file  . Highest education level: Not on file  Occupational History  . Not on file  Tobacco Use  . Smoking status: Current Every Day Smoker    Packs/day: 0.50  . Smokeless tobacco: Never Used  Vaping Use  . Vaping Use: Never used  Substance and Sexual Activity  . Alcohol use: Yes    Comment: once a week a beer or  2  . Drug use: Not on file  . Sexual activity: Not Currently  Other Topics Concern  . Not on file  Social History Narrative  . Not on file   Social Determinants of Health   Financial Resource Strain: Not on file  Food Insecurity: Not on file  Transportation Needs: Not on file  Physical Activity: Not on file  Stress: Not on file  Social Connections: Not on file   Additional Social History:    Allergies:  No Known Allergies  Labs: No results found for this or any previous visit (from the past 48 hour(s)).  Current Facility-Administered Medications  Medication Dose Route Frequency Provider Last Rate Last Admin  . escitalopram (LEXAPRO) tablet 10 mg  10 mg  Oral Daily Ward, Kristen N, DO   10 mg at 01/31/21 0859  . fentaNYL (DURAGESIC) 12 MCG/HR 1 patch  1 patch Transdermal Q72H Ward, Kristen N, DO   1 patch at 01/30/21 1643  . fentaNYL (DURAGESIC) 25 MCG/HR 1 patch  1 patch Transdermal Q72H Ward, Kristen N, DO   1 patch at 01/30/21 1053  . gabapentin (NEURONTIN) capsule 300 mg  300 mg Oral TID Ward, Kristen N, DO   300 mg at 01/31/21 0858  . loperamide (IMODIUM) capsule 2 mg  2 mg Oral PRN Ward, Kristen N, DO      . LORazepam (ATIVAN) tablet 0.5 mg  0.5 mg Oral Q8H Ward, Kristen N, DO   0.5 mg at 01/31/21 0859  . nicotine (NICODERM CQ - dosed in mg/24 hours) patch 14 mg  14 mg Transdermal Daily Lucrezia Starch, MD   14 mg at 01/31/21 0900  . ondansetron (ZOFRAN) tablet 8 mg  8 mg Oral Q8H PRN Ward, Kristen N, DO      . oxyCODONE (Oxy IR/ROXICODONE) immediate release tablet 20 mg  20 mg Oral Q4H PRN Ward, Kristen N, DO      . pantoprazole (PROTONIX) EC tablet 20 mg  20 mg Oral Daily Ward, Kristen N, DO   20 mg at 01/31/21 1041  . prochlorperazine (COMPAZINE) tablet 10 mg  10 mg Oral Q6H PRN Ward, Delice Bison, DO       Current Outpatient Medications  Medication Sig Dispense Refill  . amoxicillin-clavulanate (AUGMENTIN) 875-125 MG tablet Take 1 tablet by mouth 2 (two) times daily. 14 tablet 0  . COVID-19 mRNA vaccine, Pfizer, 30 MCG/0.3ML injection AS DIRECTED .3 mL 0  . dexamethasone (DECADRON) 4 MG tablet Take 2 tablets (8 mg total) by mouth daily. Take daily x 3 days starting the day after cisplatin chemotherapy. Take with food. (Patient taking differently: Take 8 mg by mouth See admin instructions. Take 8 mg daily x 3 days starting the day after cisplatin chemotherapy. Take with food.) 30 tablet 1  . escitalopram (LEXAPRO) 10 MG tablet Take 1 tablet (10 mg total) by mouth daily. Take half pill per day for 5 days then increase to one pill per day 30 tablet 1  . fentaNYL (DURAGESIC) 12 MCG/HR Place 1 patch onto the skin every 3 (three) days. This is additonal to the 25 mcg to make 37 mcg total 10 patch 0  . fentaNYL (DURAGESIC) 25 MCG/HR Place 1 patch onto the skin every 3 (three) days. This is additonal to the12 mcg to make 37 mcg total 10 patch 0  . fluconazole (DIFLUCAN) 100 MG tablet Take 1 tablet (100 mg total) by mouth daily. 7 tablet 0  . gabapentin (NEURONTIN) 300 MG capsule TAKE 1 CAPSULE (300 MG) BY MOUTH 3 TIMESDAILY 90 capsule 2  . lidocaine-prilocaine (EMLA) cream Apply to affected area once (Patient taking differently: Apply 1 application topically daily as needed (port access).) 30 g 3  . loperamide (IMODIUM) 2 MG capsule Take 1 capsule (2 mg total) by mouth as needed for diarrhea or  loose stools (take 2 tab with first loose stool, then 1 tab. after every loose stool after that and maximum 8 a day). 48 capsule 0  . LORazepam (ATIVAN) 0.5 MG tablet Take 1 tablet (0.5 mg total) by mouth every 8 (eight) hours. 60 tablet 0  . Nutritional Supplements (FEEDING SUPPLEMENT, OSMOLITE 1.5 CAL,) LIQD Give 1 1/2 cartons of formula 4 times per day via feeding tube (  8am, noon, 4pm and 8pm). Flush with 46ml of water before and after each feeding. Drink or give additional 257ml water TID between feedings via tube for better hydration. 1422 mL 0  . nystatin (MYCOSTATIN) 100000 UNIT/ML suspension Take 5 mLs (500,000 Units total) by mouth 4 (four) times daily. 473 mL 1  . ondansetron (ZOFRAN) 8 MG tablet Take 1 tablet (8 mg total) by mouth 2 (two) times daily as needed. Start on the third day after cisplatin chemotherapy. 30 tablet 1  . Oxycodone HCl 20 MG TABS Take 1 tablet (20 mg total) by mouth every 4 (four) hours as needed. 180 tablet 0  . pantoprazole (PROTONIX) 20 MG tablet TAKE 1 TABLET BY MOUTH DAILY. 30 tablet 1  . prochlorperazine (COMPAZINE) 10 MG tablet Take 1 tablet (10 mg total) by mouth every 6 (six) hours as needed (Nausea or vomiting). 30 tablet 1  . sertraline (ZOLOFT) 50 MG tablet Take 1 tablet (50 mg total) by mouth daily. (Patient not taking: No sig reported) 30 tablet 2    Musculoskeletal: Strength & Muscle Tone: within normal limits Gait & Station: normal Patient leans: N/A            Psychiatric Specialty Exam:  Presentation  General Appearance: No data recorded Eye Contact:No data recorded Speech:No data recorded Speech Volume:No data recorded Handedness:No data recorded  Mood and Affect  Mood:No data recorded Affect:No data recorded  Thought Process  Thought Processes:No data recorded Descriptions of Associations:No data recorded Orientation:No data recorded Thought Content:No data recorded History of Schizophrenia/Schizoaffective  disorder:No  Duration of Psychotic Symptoms:No data recorded Hallucinations:No data recorded Ideas of Reference:No data recorded Suicidal Thoughts:No data recorded Homicidal Thoughts:No data recorded  Sensorium  Memory:No data recorded Judgment:No data recorded Insight:No data recorded  Executive Functions  Concentration:No data recorded Attention Span:No data recorded Recall:No data recorded Fund of Knowledge:No data recorded Language:No data recorded  Psychomotor Activity  Psychomotor Activity:No data recorded  Assets  Assets:No data recorded  Sleep  Sleep:No data recorded  Physical Exam: Physical Exam Vitals and nursing note reviewed.  Constitutional:      Appearance: Normal appearance.  HENT:     Head: Normocephalic and atraumatic.     Mouth/Throat:     Pharynx: Oropharynx is clear.  Eyes:     Pupils: Pupils are equal, round, and reactive to light.  Cardiovascular:     Rate and Rhythm: Normal rate and regular rhythm.  Pulmonary:     Effort: Pulmonary effort is normal.     Breath sounds: Normal breath sounds.  Abdominal:     General: Abdomen is flat.     Palpations: Abdomen is soft.  Musculoskeletal:        General: Normal range of motion.  Skin:    General: Skin is warm and dry.  Neurological:     General: No focal deficit present.     Mental Status: He is alert. Mental status is at baseline.  Psychiatric:        Attention and Perception: Attention normal.        Mood and Affect: Mood is anxious and depressed.        Speech: Speech is delayed.        Behavior: Behavior is slowed.        Thought Content: Thought content includes suicidal ideation. Thought content does not include suicidal plan.    Review of Systems  Constitutional: Negative.   HENT: Negative.   Eyes: Negative.   Respiratory: Negative.  Cardiovascular: Negative.   Gastrointestinal: Negative.   Musculoskeletal: Negative.   Skin: Negative.   Neurological: Negative.    Psychiatric/Behavioral: Positive for depression and substance abuse. The patient is nervous/anxious.    Blood pressure (!) 142/86, pulse 76, temperature (!) 97.5 F (36.4 C), temperature source Oral, resp. rate 18, height 5\' 9"  (1.753 m), weight 63.5 kg, SpO2 99 %. Body mass index is 20.68 kg/m.  Treatment Plan Summary: Plan Because it has been the weekend and the holiday is not surprising we have not found a bed.  I advised patient that we will seek for another day to see if we can find a bed offer for him however if we are not able to we will probably turn this over to social work and recommend he be discharged with whatever assistance with homelessness can be found.  No change to medicine  Disposition: Supportive therapy provided about ongoing stressors.  Alethia Berthold, MD 01/31/2021 1:40 PM

## 2021-01-31 NOTE — ED Notes (Signed)
Pt given cereal, milk, and coca cola. Pt calm and cooperative. Will continue to monitor.

## 2021-01-31 NOTE — ED Notes (Signed)
VOL, pending placement

## 2021-01-31 NOTE — ED Notes (Signed)
VS not taken, patient asleep 

## 2021-01-31 NOTE — ED Notes (Signed)
Dinner given to pt. Pt calm and cooperative.

## 2021-02-01 ENCOUNTER — Ambulatory Visit: Payer: Medicaid Other

## 2021-02-01 ENCOUNTER — Other Ambulatory Visit: Payer: Medicaid Other

## 2021-02-01 ENCOUNTER — Ambulatory Visit: Payer: Medicaid Other | Admitting: Oncology

## 2021-02-01 ENCOUNTER — Inpatient Hospital Stay: Payer: Medicaid Other

## 2021-02-01 DIAGNOSIS — F322 Major depressive disorder, single episode, severe without psychotic features: Secondary | ICD-10-CM | POA: Diagnosis not present

## 2021-02-01 NOTE — BH Assessment (Signed)
Referral checks   [facility] ([phone]),Per Dian Situ no appropriate beds   [facility] [phone] -[phone]) [phone]: No answer at 1108 - [place] [phone]: [facility] states they don't have his info; please re-submit. [place] [phone]):    [facility] [phone] or [phone]):Chris states he has not yet had a chance to review referrals. [place] [phone]), Per Lamonte Sakai, fax received, however there won't be beds available until Monday 01/31/21.   [facility] [phone] -or- [phone] Helyn App no beds are available until Monday 01/31/21.    [facility] [phone]).No answer; no ability to leave voicemail.   [facility] [phone]) Lovena Le requested a refax. Task completed 2:38 PM.    [facility] ([place], pt is still under review.

## 2021-02-01 NOTE — Consult Note (Signed)
[facility] Consult   Reason for Consult: Follow-up consult 50 year old man with substance abuse and depression Referring Physician: Archie Balboa Patient Identification: Paul Hebert MRN:  [id number] Principal Diagnosis: Severe major depression, single episode ([place]) Diagnosis:  Principal Problem:   Severe major depression, single episode ([place]) Active Problems:   Cocaine abuse ([place])   Alcohol abuse   Total Time spent with patient: 30 minutes  Subjective:   Paul Hebert is a 50 y.o. male patient admitted with "I need to get off these drugs".  HPI: Patient seen for follow-up.  Patient has been here in the emergency room for several days.  Continues to endorse depression and passive very low energy hopelessness.  Passive suicidal thoughts with no intent or plan.  No evidence of psychosis.  Physically stable but of course has cancer.  Patient has not made an effort to locate any place to stay since he has been a peer.  We have so far not had any response to attempts to get him admitted to a substance abuse treatment  Past Psychiatric History: Past history of substance abuse problems  Risk to Self:   Risk to Others:   Prior Inpatient Therapy:   Prior Outpatient Therapy:    Past Medical History:  Past Medical History:  Diagnosis Date  . 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 PORTA CATH INSERTION N/A 10/25/2020   Procedure: PORTA CATH INSERTION;  Surgeon: Algernon Huxley, MD;  Location: [facility];  Service: Cardiovascular;  Laterality: N/A;   Family History:  Family History  Problem Relation Age of Onset  . Cancer Father    Family Psychiatric  History: See previous Social History:  Social History   Substance and Sexual Activity  Alcohol Use Yes   Comment: once a week a beer or  2     Social History   Substance and Sexual Activity  Drug Use Not on file    Social History   Socioeconomic History  . Marital  status: Single    Spouse name: Not on file  . Number of children: Not on file  . Years of education: Not on file  . Highest education level: Not on file  Occupational History  . Not on file  Tobacco Use  . Smoking status: Current Every Day Smoker    Packs/day: 0.50  . Smokeless tobacco: Never Used  Vaping Use  . Vaping Use: Never used  Substance and Sexual Activity  . Alcohol use: Yes    Comment: once a week a beer or  2  . Drug use: Not on file  . Sexual activity: Not Currently  Other Topics Concern  . Not on file  Social History Narrative  . Not on file   Social Determinants of Health   Financial Resource Strain: Not on file  Food Insecurity: Not on file  Transportation Needs: Not on file  Physical Activity: Not on file  Stress: Not on file  Social Connections: Not on file   Additional Social History:    Allergies:  No Known Allergies  Labs: No results found for this or any previous visit (from the past 48 hour(s)).  Current Facility-Administered Medications  Medication Dose Route Frequency Provider Last Rate Last Admin  . escitalopram (LEXAPRO) tablet 10 mg  10 mg Oral Daily Ward, Kristen N, DO   10 mg at 02/01/21 0950  . fentaNYL (DURAGESIC) 12 MCG/HR 1 patch  1 patch Transdermal  Q72H Ward, Delice Bison, DO   1 patch at 01/30/21 1643  . fentaNYL (DURAGESIC) 25 MCG/HR 1 patch  1 patch Transdermal Q72H Ward, Kristen N, DO   1 patch at 01/30/21 1053  . gabapentin (NEURONTIN) capsule 300 mg  300 mg Oral TID Ward, Kristen N, DO   300 mg at 02/01/21 0949  . loperamide (IMODIUM) capsule 2 mg  2 mg Oral PRN Ward, Kristen N, DO      . LORazepam (ATIVAN) tablet 0.5 mg  0.5 mg Oral Q8H Ward, Kristen N, DO   0.5 mg at 02/01/21 0559  . nicotine (NICODERM CQ - dosed in mg/24 hours) patch 14 mg  14 mg Transdermal Daily Lucrezia Starch, MD   14 mg at 02/01/21 0951  . ondansetron (ZOFRAN) tablet 8 mg  8 mg Oral Q8H PRN Ward, Kristen N, DO      . oxyCODONE (Oxy IR/ROXICODONE)  immediate release tablet 20 mg  20 mg Oral Q4H PRN Ward, Kristen N, DO      . pantoprazole (PROTONIX) EC tablet 20 mg  20 mg Oral Daily Ward, Kristen N, DO   20 mg at 01/31/21 1041  . prochlorperazine (COMPAZINE) tablet 10 mg  10 mg Oral Q6H PRN Ward, Delice Bison, DO       Current Outpatient Medications  Medication Sig Dispense Refill  . amoxicillin-clavulanate (AUGMENTIN) 875-125 MG tablet Take 1 tablet by mouth 2 (two) times daily. 14 tablet 0  . COVID-19 mRNA vaccine, Pfizer, 30 MCG/0.3ML injection AS DIRECTED .3 mL 0  . dexamethasone (DECADRON) 4 MG tablet Take 2 tablets (8 mg total) by mouth daily. Take daily x 3 days starting the day after cisplatin chemotherapy. Take with food. (Patient taking differently: Take 8 mg by mouth See admin instructions. Take 8 mg daily x 3 days starting the day after cisplatin chemotherapy. Take with food.) 30 tablet 1  . escitalopram (LEXAPRO) 10 MG tablet Take 1 tablet (10 mg total) by mouth daily. Take half pill per day for 5 days then increase to one pill per day 30 tablet 1  . fentaNYL (DURAGESIC) 12 MCG/HR Place 1 patch onto the skin every 3 (three) days. This is additonal to the 25 mcg to make 37 mcg total 10 patch 0  . fentaNYL (DURAGESIC) 25 MCG/HR Place 1 patch onto the skin every 3 (three) days. This is additonal to the12 mcg to make 37 mcg total 10 patch 0  . fluconazole (DIFLUCAN) 100 MG tablet Take 1 tablet (100 mg total) by mouth daily. 7 tablet 0  . gabapentin (NEURONTIN) 300 MG capsule TAKE 1 CAPSULE (300 MG) BY MOUTH 3 TIMESDAILY 90 capsule 2  . lidocaine-prilocaine (EMLA) cream Apply to affected area once (Patient taking differently: Apply 1 application topically daily as needed (port access).) 30 g 3  . loperamide (IMODIUM) 2 MG capsule Take 1 capsule (2 mg total) by mouth as needed for diarrhea or loose stools (take 2 tab with first loose stool, then 1 tab. after every loose stool after that and maximum 8 a day). 48 capsule 0  . LORazepam  (ATIVAN) 0.5 MG tablet Take 1 tablet (0.5 mg total) by mouth every 8 (eight) hours. 60 tablet 0  . Nutritional Supplements (FEEDING SUPPLEMENT, OSMOLITE 1.5 CAL,) LIQD Give 1 1/2 cartons of formula 4 times per day via feeding tube (8am, noon, 4pm and 8pm). Flush with 51ml of water before and after each feeding. Drink or give additional 252ml water TID between feedings via  tube for better hydration. 1422 mL 0  . nystatin (MYCOSTATIN) 100000 UNIT/ML suspension Take 5 mLs (500,000 Units total) by mouth 4 (four) times daily. 473 mL 1  . ondansetron (ZOFRAN) 8 MG tablet Take 1 tablet (8 mg total) by mouth 2 (two) times daily as needed. Start on the third day after cisplatin chemotherapy. 30 tablet 1  . Oxycodone HCl 20 MG TABS Take 1 tablet (20 mg total) by mouth every 4 (four) hours as needed. 180 tablet 0  . pantoprazole (PROTONIX) 20 MG tablet TAKE 1 TABLET BY MOUTH DAILY. 30 tablet 1  . prochlorperazine (COMPAZINE) 10 MG tablet Take 1 tablet (10 mg total) by mouth every 6 (six) hours as needed (Nausea or vomiting). 30 tablet 1  . sertraline (ZOLOFT) 50 MG tablet Take 1 tablet (50 mg total) by mouth daily. (Patient not taking: No sig reported) 30 tablet 2    Musculoskeletal: Strength & Muscle Tone: within normal limits Gait & Station: normal Patient leans: N/A            Psychiatric Specialty Exam:  Presentation  General Appearance: No data recorded Eye Contact:No data recorded Speech:No data recorded Speech Volume:No data recorded Handedness:No data recorded  Mood and Affect  Mood:No data recorded Affect:No data recorded  Thought Process  Thought Processes:No data recorded Descriptions of Associations:No data recorded Orientation:No data recorded Thought Content:No data recorded History of Schizophrenia/Schizoaffective disorder:No  Duration of Psychotic Symptoms:No data recorded Hallucinations:No data recorded Ideas of Reference:No data recorded Suicidal Thoughts:No  data recorded Homicidal Thoughts:No data recorded  Sensorium  Memory:No data recorded Judgment:No data recorded Insight:No data recorded  Executive Functions  Concentration:No data recorded Attention Span:No data recorded Recall:No data recorded Fund of Knowledge:No data recorded Language:No data recorded  Psychomotor Activity  Psychomotor Activity:No data recorded  Assets  Assets:No data recorded  Sleep  Sleep:No data recorded  Physical Exam: Physical Exam Vitals and nursing note reviewed.  Constitutional:      Appearance: Normal appearance.  HENT:     Head: Normocephalic and atraumatic.     Mouth/Throat:     Pharynx: Oropharynx is clear.  Eyes:     Pupils: Pupils are equal, round, and reactive to light.  Cardiovascular:     Rate and Rhythm: Normal rate and regular rhythm.  Pulmonary:     Effort: Pulmonary effort is normal.     Breath sounds: Normal breath sounds.  Abdominal:     General: Abdomen is flat.     Palpations: Abdomen is soft.  Musculoskeletal:        General: Normal range of motion.  Skin:    General: Skin is warm and dry.  Neurological:     General: No focal deficit present.     Mental Status: He is alert. Mental status is at baseline.  Psychiatric:        Attention and Perception: Attention normal.        Mood and Affect: Mood is depressed.        Speech: Speech is delayed.        Behavior: Behavior is slowed.        Thought Content: Thought content normal. Thought content is not paranoid. Thought content does not include homicidal or suicidal ideation.        Cognition and Memory: Cognition normal.        Judgment: Judgment normal.    Review of Systems  Constitutional: Negative.   HENT: Negative.   Eyes: Negative.   Respiratory: Negative.  Cardiovascular: Negative.   Gastrointestinal: Negative.   Musculoskeletal: Negative.   Skin: Negative.   Neurological: Negative.   Psychiatric/Behavioral: Positive for depression and substance  abuse. Negative for hallucinations and suicidal ideas.   Blood pressure 97/70, pulse 73, temperature 97.8 F (36.6 C), temperature source Oral, resp. rate 18, height 5\' 9"  (1.753 m), weight 63.5 kg, SpO2 97 %. Body mass index is 20.68 kg/m.  Treatment Plan Summary: Plan Spoke with TTS today.  Does not appear that there is very likely that we will be able to find a disposition for him and he will probably end up needing to be referred to [facility] for homelessness assistance.  Patient advised of this and encouraged to think hard about whether there could be anyone he can contact about help.  No change to medical treatment or plan at this point.  Disposition: No evidence of imminent risk to self or others at present.   Supportive therapy provided about ongoing stressors.  Alethia Berthold, MD 02/01/2021 11:04 AM

## 2021-02-02 ENCOUNTER — Ambulatory Visit: Payer: Medicaid Other

## 2021-02-02 ENCOUNTER — Inpatient Hospital Stay: Payer: Medicaid Other

## 2021-02-02 DIAGNOSIS — F322 Major depressive disorder, single episode, severe without psychotic features: Secondary | ICD-10-CM | POA: Diagnosis not present

## 2021-02-02 MED ORDER — BACITRACIN-NEOMYCIN-POLYMYXIN OINTMENT TUBE
TOPICAL_OINTMENT | CUTANEOUS | Status: DC | PRN
  Filled 2021-02-02 (×2): qty 14.17

## 2021-02-02 NOTE — ED Notes (Signed)
Unable to obtain vitals due to patient sleeping. Will continue to monitor.   

## 2021-02-02 NOTE — TOC Initial Note (Signed)
Transition of Care [facility]) - Initial/Assessment Note    Patient Details  Name: Paul Hebert MRN: [id number] Date of Birth: Sep 03, 1971  Transition of Care [facility]) CM/SW Contact:    Ova Freshwater Phone Number: [phone] 02/02/2021, 4:00 PM  Clinical Narrative:                  Patient presents to [facility] ED due to suicidal ideation.  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  Patient was walked over to [facility] ED from cancer center when he expressed SI to the staff there.  Patient stated he has been missing radiation treatment appointment because he has been actively using crack/cocaine and drinking alcohol.  Patient stated he may lose hs current home soon and his fiance left him. Patient has been assessed by TTS several times and continues to express S/I but inpatient psych placement has been difficult to find due to PICC line and tube feed line.  Dr. Weber Cooks contacted patient's Oncologist to request assistance in possible having the patient tubes removed, so he may find inpatient psych placement. TOC is only able to offer community resource list for outpatient mental health, the patient has Medicaid and is also followed by the social worker from the cancer center.  Patient also has DSS case worker who has assisted him with reinstating his food stamps. Patient would have a very difficult time finding long-term care placement with active S/I, active drug/alcohol use and PIIC/tube feed lines.  CSW updated EDP and Dr. Cam Hai on the assistance [facility] can offer.  The current plan is to find out if the patient can have his PICC/tube feed lines removed, so he may find inpatient psych treatment placement.   Barriers to Discharge: Psych Bed not available   Patient Goals and CMS Choice        Expected Discharge Plan and Services                                                Prior Living Arrangements/Services                       Activities of Daily  Living      Permission Sought/Granted                  Emotional Assessment              Admission diagnosis:  beh med eval Patient Active Problem List   Diagnosis Date Noted  . Cocaine abuse ([place]) 01/28/2021  . Alcohol abuse 01/28/2021  . Severe major depression, single episode ([place]) 01/28/2021  . 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 Goals of care, counseling/discussion 10/18/2020   PCP:  Pcp, No Pharmacy:   [place], [place] [address] Phone: [phone] Fax: [phone]     Social Determinants of Health (SDOH) Interventions    Readmission Risk Interventions No flowsheet data found.

## 2021-02-02 NOTE — ED Notes (Signed)
Patient is resting comfortably. 

## 2021-02-02 NOTE — ED Provider Notes (Signed)
Emergency Medicine Observation Re-evaluation Note  RONDAL VANDEVELDE is a 50 y.o. male.  Pt initially presented to the ED for complaints of Psychiatric Evaluation and Addiction Problem Currently, the patient is in no acute distress.  Physical Exam  BP (!) 122/94 (BP Location: Right Arm)   Pulse 86   Temp 98 F (36.7 C) (Oral)   Resp 16   Ht 5\' 9"  (1.753 m)   Wt 63.5 kg   SpO2 100%   BMI 20.68 kg/m  Physical Exam General: No acute distress Cardiac: regular rate Lungs: no increased work of breathing Psych: calm  ED Course / MDM   I have reviewed the labs performed to date as well as medications administered while in observation.    Plan  Current plan is for placement. Patient is not under full IVC at this time.   Nance Pear, MD 02/02/21 1010

## 2021-02-02 NOTE — ED Notes (Signed)
Pt lying in bed watching tv

## 2021-02-02 NOTE — ED Notes (Signed)
Pt given clean scrubs and shower supplies. Pt in shower at this time. Will retrieve all items and dirty scrubs when pt out of shower.

## 2021-02-02 NOTE — ED Notes (Signed)
VOL/Pending TOC Placement 

## 2021-02-02 NOTE — ED Notes (Signed)
Resumed care from collyn rn.   Pt in shower.

## 2021-02-02 NOTE — Consult Note (Signed)
[facility] Consult   Reason for Consult: Follow-up note for this patient with substance abuse and depression Referring Physician: Archie Balboa Patient Identification: Paul Hebert MRN:  [id number] Principal Diagnosis: Severe major depression, single episode ([place]) Diagnosis:  Principal Problem:   Severe major depression, single episode ([place]) Active Problems:   Cocaine abuse ([place])   Alcohol abuse   Total Time spent with patient: 45 minutes  Subjective:   Paul Hebert is a 50 y.o. male patient admitted with "I think I might do something stupid".  HPI: Patient seen for follow-up.  50 year old man with cancer and substance abuse problems who has been in the emergency room for many days.  He came in reporting depression with suicidal ideation and ongoing heavy substance abuse and having been thrown out of the place where he was living.  We have tried referring him to our unit as well as to other psychiatric units but have been turned down everywhere largely because of his medical condition and continuing to have his Port-A-Cath and stomach tube in place.  Past Psychiatric History: Past history of longstanding substance abuse and behavior problems  Risk to Self:   Risk to Others:   Prior Inpatient Therapy:   Prior Outpatient Therapy:    Past Medical History:  Past Medical History:  Diagnosis Date  . 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 PORTA CATH INSERTION N/A 10/25/2020   Procedure: PORTA CATH INSERTION;  Surgeon: Algernon Huxley, MD;  Location: [facility];  Service: Cardiovascular;  Laterality: N/A;   Family History:  Family History  Problem Relation Age of Onset  . Cancer Father    Family Psychiatric  History: See previous Social History:  Social History   Substance and Sexual Activity  Alcohol Use Yes   Comment: once a week a beer or  2     Social History   Substance and Sexual Activity  Drug Use Not on  file    Social History   Socioeconomic History  . Marital status: Single    Spouse name: Not on file  . Number of children: Not on file  . Years of education: Not on file  . Highest education level: Not on file  Occupational History  . Not on file  Tobacco Use  . Smoking status: Current Every Day Smoker    Packs/day: 0.50  . Smokeless tobacco: Never Used  Vaping Use  . Vaping Use: Never used  Substance and Sexual Activity  . Alcohol use: Yes    Comment: once a week a beer or  2  . Drug use: Not on file  . Sexual activity: Not Currently  Other Topics Concern  . Not on file  Social History Narrative  . Not on file   Social Determinants of Health   Financial Resource Strain: Not on file  Food Insecurity: Not on file  Transportation Needs: Not on file  Physical Activity: Not on file  Stress: Not on file  Social Connections: Not on file   Additional Social History:    Allergies:  No Known Allergies  Labs: No results found for this or any previous visit (from the past 48 hour(s)).  Current Facility-Administered Medications  Medication Dose Route Frequency Provider Last Rate Last Admin  . escitalopram (LEXAPRO) tablet 10 mg  10 mg Oral Daily Ward, Kristen N, DO   10 mg at 02/02/21 1022  . fentaNYL (DURAGESIC) 12 MCG/HR 1 patch  1 patch Transdermal Q72H Ward, Kristen N, DO   1 patch at 02/02/21 1151  . fentaNYL (DURAGESIC) 25 MCG/HR 1 patch  1 patch Transdermal Q72H Ward, Kristen N, DO   1 patch at 02/02/21 1029  . gabapentin (NEURONTIN) capsule 300 mg  300 mg Oral TID Ward, Kristen N, DO   300 mg at 02/02/21 1023  . loperamide (IMODIUM) capsule 2 mg  2 mg Oral PRN Ward, Kristen N, DO      . LORazepam (ATIVAN) tablet 0.5 mg  0.5 mg Oral Q8H Ward, Kristen N, DO   0.5 mg at 02/02/21 1454  . neomycin-bacitracin-polymyxin (NEOSPORIN) ointment   Topical PRN Nance Pear, MD      . nicotine (NICODERM CQ - dosed in mg/24 hours) patch 14 mg  14 mg Transdermal Daily Lucrezia Starch, MD   14 mg at 02/02/21 1023  . ondansetron (ZOFRAN) tablet 8 mg  8 mg Oral Q8H PRN Ward, Kristen N, DO      . oxyCODONE (Oxy IR/ROXICODONE) immediate release tablet 20 mg  20 mg Oral Q4H PRN Ward, Kristen N, DO      . pantoprazole (PROTONIX) EC tablet 20 mg  20 mg Oral Daily Ward, Kristen N, DO   20 mg at 02/02/21 1027  . prochlorperazine (COMPAZINE) tablet 10 mg  10 mg Oral Q6H PRN Ward, Paul Bison, DO       Current Outpatient Medications  Medication Sig Dispense Refill  . amoxicillin-clavulanate (AUGMENTIN) 875-125 MG tablet Take 1 tablet by mouth 2 (two) times daily. 14 tablet 0  . COVID-19 mRNA vaccine, Pfizer, 30 MCG/0.3ML injection AS DIRECTED .3 mL 0  . dexamethasone (DECADRON) 4 MG tablet Take 2 tablets (8 mg total) by mouth daily. Take daily x 3 days starting the day after cisplatin chemotherapy. Take with food. (Patient taking differently: Take 8 mg by mouth See admin instructions. Take 8 mg daily x 3 days starting the day after cisplatin chemotherapy. Take with food.) 30 tablet 1  . escitalopram (LEXAPRO) 10 MG tablet Take 1 tablet (10 mg total) by mouth daily. Take half pill per day for 5 days then increase to one pill per day 30 tablet 1  . fentaNYL (DURAGESIC) 12 MCG/HR Place 1 patch onto the skin every 3 (three) days. This is additonal to the 25 mcg to make 37 mcg total 10 patch 0  . fentaNYL (DURAGESIC) 25 MCG/HR Place 1 patch onto the skin every 3 (three) days. This is additonal to the12 mcg to make 37 mcg total 10 patch 0  . fluconazole (DIFLUCAN) 100 MG tablet Take 1 tablet (100 mg total) by mouth daily. 7 tablet 0  . gabapentin (NEURONTIN) 300 MG capsule TAKE 1 CAPSULE (300 MG) BY MOUTH 3 TIMESDAILY 90 capsule 2  . lidocaine-prilocaine (EMLA) cream Apply to affected area once (Patient taking differently: Apply 1 application topically daily as needed (port access).) 30 g 3  . loperamide (IMODIUM) 2 MG capsule Take 1 capsule (2 mg total) by mouth as needed for diarrhea or  loose stools (take 2 tab with first loose stool, then 1 tab. after every loose stool after that and maximum 8 a day). 48 capsule 0  . LORazepam (ATIVAN) 0.5 MG tablet Take 1 tablet (0.5 mg total) by mouth every 8 (eight) hours. 60 tablet 0  . Nutritional Supplements (FEEDING SUPPLEMENT, OSMOLITE 1.5 CAL,) LIQD Give 1 1/2 cartons of formula 4 times per day via feeding tube (8am, noon, 4pm and 8pm). Flush  with 1ml of water before and after each feeding. Drink or give additional 296ml water TID between feedings via tube for better hydration. 1422 mL 0  . nystatin (MYCOSTATIN) 100000 UNIT/ML suspension Take 5 mLs (500,000 Units total) by mouth 4 (four) times daily. 473 mL 1  . ondansetron (ZOFRAN) 8 MG tablet Take 1 tablet (8 mg total) by mouth 2 (two) times daily as needed. Start on the third day after cisplatin chemotherapy. 30 tablet 1  . Oxycodone HCl 20 MG TABS Take 1 tablet (20 mg total) by mouth every 4 (four) hours as needed. 180 tablet 0  . pantoprazole (PROTONIX) 20 MG tablet TAKE 1 TABLET BY MOUTH DAILY. 30 tablet 1  . prochlorperazine (COMPAZINE) 10 MG tablet Take 1 tablet (10 mg total) by mouth every 6 (six) hours as needed (Nausea or vomiting). 30 tablet 1  . sertraline (ZOLOFT) 50 MG tablet Take 1 tablet (50 mg total) by mouth daily. (Patient not taking: No sig reported) 30 tablet 2    Musculoskeletal: Strength & Muscle Tone: decreased Gait & Station: normal Patient leans: N/A            Psychiatric Specialty Exam:  Presentation  General Appearance: No data recorded Eye Contact:No data recorded Speech:No data recorded Speech Volume:No data recorded Handedness:No data recorded  Mood and Affect  Mood:No data recorded Affect:No data recorded  Thought Process  Thought Processes:No data recorded Descriptions of Associations:No data recorded Orientation:No data recorded Thought Content:No data recorded History of Schizophrenia/Schizoaffective disorder:No  Duration  of Psychotic Symptoms:No data recorded Hallucinations:No data recorded Ideas of Reference:No data recorded Suicidal Thoughts:No data recorded Homicidal Thoughts:No data recorded  Sensorium  Memory:No data recorded Judgment:No data recorded Insight:No data recorded  Executive Functions  Concentration:No data recorded Attention Span:No data recorded Recall:No data recorded Fund of Knowledge:No data recorded Language:No data recorded  Psychomotor Activity  Psychomotor Activity:No data recorded  Assets  Assets:No data recorded  Sleep  Sleep:No data recorded  Physical Exam: Physical Exam Vitals and nursing note reviewed.  Constitutional:      Appearance: Normal appearance.  HENT:     Head: Normocephalic and atraumatic.     Mouth/Throat:     Pharynx: Oropharynx is clear.  Eyes:     Pupils: Pupils are equal, round, and reactive to light.  Cardiovascular:     Rate and Rhythm: Normal rate and regular rhythm.  Pulmonary:     Effort: Pulmonary effort is normal.     Breath sounds: Normal breath sounds.  Abdominal:     General: Abdomen is flat.     Palpations: Abdomen is soft.  Musculoskeletal:        General: Normal range of motion.  Skin:    General: Skin is warm and dry.  Neurological:     General: No focal deficit present.     Mental Status: He is alert. Mental status is at baseline.  Psychiatric:        Attention and Perception: He is inattentive.        Mood and Affect: Mood is depressed. Affect is blunt and tearful.        Speech: Speech is delayed.        Behavior: Behavior is slowed.        Thought Content: Thought content includes suicidal ideation. Thought content does not include suicidal plan.        Cognition and Memory: Cognition is impaired. Memory is impaired.    Review of Systems  Constitutional: Negative.  HENT: Negative.   Eyes: Negative.   Respiratory: Negative.   Cardiovascular: Negative.   Gastrointestinal: Negative.   Musculoskeletal:  Negative.   Skin: Negative.   Neurological: Negative.   Psychiatric/Behavioral: Positive for depression and suicidal ideas.   Blood pressure (!) 122/94, pulse 86, temperature 98 F (36.7 C), temperature source Oral, resp. rate 16, height 5\' 9"  (1.753 m), weight 63.5 kg, SpO2 100 %. Body mass index is 20.68 kg/m.  Treatment Plan Summary: Medication management and Plan Difficult situation.  Patient is homeless and I have asked him multiple times to call people to see if anyone has a place for him to stay.  He says he has done so without any success.  He reports having no money whatsoever and continues to talk about suicidal ideation when discharge is brought up.  Despite that at this point there is no hope that we are going to get him into any kind of psychiatric treatment in his current condition.  He is receiving antidepressant medicine here in the emergency room but remains depressed and suicidal or at least crying and talking about suicide frequently.  He has multiple risk factors for suicide.  Very difficult situation to know what to do.  I sent a text message to the oncologist who had seen him today just to ask if it might be okay to have his Port-A-Cath and stomach tube removed so that we could place him somewhere.  Obviously that would severely limit his further cancer treatment but he is not getting any of that right now anyway.  No change to treatment plan for today.  Case reviewed with TTS.  Disposition: Supportive therapy provided about ongoing stressors. See note above.  Difficult situation.  Patient is not going to qualify for any inpatient psychiatric treatment but continues to endorse suicidal ideation  Alethia Berthold, MD 02/02/2021 3:07 PM

## 2021-02-03 ENCOUNTER — Ambulatory Visit: Payer: Medicaid Other

## 2021-02-03 DIAGNOSIS — F322 Major depressive disorder, single episode, severe without psychotic features: Secondary | ICD-10-CM | POA: Diagnosis not present

## 2021-02-03 DIAGNOSIS — C109 Malignant neoplasm of oropharynx, unspecified: Secondary | ICD-10-CM | POA: Diagnosis not present

## 2021-02-03 DIAGNOSIS — Z95828 Presence of other vascular implants and grafts: Secondary | ICD-10-CM | POA: Diagnosis not present

## 2021-02-03 NOTE — ED Notes (Signed)
Hourly rounding performed, patient currently awake in room. Patient has no complaints at this time. Q15 minute rounds and monitoring via [employer] to continue. Pt provided with snack - 2 cereals, milk, chocolate ice cream per request

## 2021-02-03 NOTE — ED Provider Notes (Signed)
Emergency Medicine Observation Re-evaluation Note  MERVILLE HIJAZI is a 50 y.o. male, seen on rounds today.  Pt initially presented to the ED for complaints of Psychiatric Evaluation and Addiction Problem  Currently, the patient is is no acute distress. Denies any concerns at this time.  Physical Exam  Blood pressure 132/82, pulse 83, temperature 98.2 F (36.8 C), temperature source Oral, resp. rate 17, height 5\' 9"  (1.753 m), weight 63.5 kg, SpO2 98 %.  Physical Exam General: No apparent distress HEENT: moist mucous membranes CV: RRR Pulm: Normal WOB GI: soft and non tender MSK: no edema or cyanosis Neuro: face symmetric, moving all extremities     ED Course / MDM     I have reviewed the labs performed to date as well as medications administered while in observation.  Recent changes in the last 24 hours include none   Plan   Current plan is to continue to wait for psych plan/placement if felt warranted  Patient is not under full IVC at this time.   Vanessa [place], MD 02/03/21 0730

## 2021-02-03 NOTE — ED Notes (Signed)
VOL/pending placement 

## 2021-02-03 NOTE — ED Notes (Signed)
Pt given meal tray.

## 2021-02-03 NOTE — Consult Note (Signed)
[facility] SURGICAL CONSULTATION NOTE (initial) - cpt: 45364   HISTORY OF PRESENT ILLNESS (HPI):  50 y.o. male presented to [facility] ED initially on 04/14 for evaluation for mental health concerns. 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 He also has a port-a-catheter which was placed in January with Dr Lucky Cowboy. He did need his gastrostomy tube exchanges on POD11 but had since done well. He presented to the emergency depart on 04/14 with concerns over his mental health. He reports significant life stressors, changes in living conditions, and limited support on top of needing chemoradiation treatments. However, on chart review he has multiple cancelled appointments for this. He has been followed by psychiatry while here and is pending placement in psychiatric ward however he can not go there with port-a-catheter in place nor gastrostomy tube as these are seen as a threat to his safety/potential for self harm. Case has been discussed with his oncologist, Dr Janese Banks, who is in agreement with removing these to allow him to get psychiatric help.   Surgery is consulted by Dr. Alethia Berthold, MD in this context for evaluation fpr port and gastrostomy tube removal.   PAST MEDICAL HISTORY (PMH):  Past Medical History:  Diagnosis Date  . Tonsil cancer ([place])      PAST SURGICAL HISTORY ([place]):  Past Surgical History:  Procedure Laterality Date  . PORTA CATH INSERTION N/A 10/25/2020   Procedure: PORTA CATH INSERTION;  Surgeon: Algernon Huxley, MD;  Location: [facility];  Service: Cardiovascular;  Laterality: N/A;     MEDICATIONS:  Prior to Admission medications   Medication Sig Start Date End Date Taking? Authorizing Provider  amoxicillin-clavulanate (AUGMENTIN) 875-125 MG tablet Take 1 tablet by  mouth 2 (two) times daily. 12/27/20   Sindy Guadeloupe, MD  COVID-19 mRNA vaccine, Pfizer, 30 MCG/0.3ML injection AS DIRECTED 11/02/20 11/02/21  Carlyle Basques, MD  dexamethasone (DECADRON) 4 MG tablet Take 2 tablets (8 mg total) by mouth daily. Take daily x 3 days starting the day after cisplatin chemotherapy. Take with food. Patient taking differently: Take 8 mg by mouth See admin instructions. Take 8 mg daily x 3 days starting the day after cisplatin chemotherapy. Take with food. 11/01/20   Sindy Guadeloupe, MD  escitalopram (LEXAPRO) 10 MG tablet Take 1 tablet (10 mg total) by mouth daily. Take half pill per day for 5 days then increase to one pill per day 01/27/21 03/28/21  Clapacs, Madie Reno, MD  fentaNYL (DURAGESIC) 12 MCG/HR Place 1 patch onto the skin every 3 (three) days. This is additonal to the 25 mcg to make 37 mcg total 12/27/20   Sindy Guadeloupe, MD  fentaNYL (DURAGESIC) 25 MCG/HR Place 1 patch onto the skin every 3 (three) days. This is additonal to the12 mcg to make 37 mcg total 12/27/20   Sindy Guadeloupe, MD  fluconazole (DIFLUCAN) 100 MG tablet Take 1 tablet (100 mg total) by mouth daily. 12/27/20   Noreene Filbert, MD  gabapentin (NEURONTIN) 300 MG capsule TAKE 1 CAPSULE (300 MG) BY MOUTH 3 TIMESDAILY 12/27/20   Sindy Guadeloupe, MD  lidocaine-prilocaine (EMLA) cream Apply to affected area once Patient taking differently: Apply 1 application topically daily as needed (port access). 11/01/20   Sindy Guadeloupe, MD  loperamide (IMODIUM) 2 MG capsule Take 1 capsule (2  mg total) by mouth as needed for diarrhea or loose stools (take 2 tab with first loose stool, then 1 tab. after every loose stool after that and maximum 8 a day). 01/20/21   Sindy Guadeloupe, MD  LORazepam (ATIVAN) 0.5 MG tablet Take 1 tablet (0.5 mg total) by mouth every 8 (eight) hours. 12/27/20   Sindy Guadeloupe, MD  Nutritional Supplements (FEEDING SUPPLEMENT, OSMOLITE 1.5 CAL,) LIQD Give 1 1/2 cartons of formula 4 times per day via feeding tube  (8am, noon, 4pm and 8pm). Flush with 60ml of water before and after each feeding. Drink or give additional 214ml water TID between feedings via tube for better hydration. 11/04/20   Sindy Guadeloupe, MD  nystatin (MYCOSTATIN) 100000 UNIT/ML suspension Take 5 mLs (500,000 Units total) by mouth 4 (four) times daily. 12/27/20   Sindy Guadeloupe, MD  ondansetron (ZOFRAN) 8 MG tablet Take 1 tablet (8 mg total) by mouth 2 (two) times daily as needed. Start on the third day after cisplatin chemotherapy. 12/06/20   Sindy Guadeloupe, MD  Oxycodone HCl 20 MG TABS Take 1 tablet (20 mg total) by mouth every 4 (four) hours as needed. 12/27/20   Sindy Guadeloupe, MD  pantoprazole (PROTONIX) 20 MG tablet TAKE 1 TABLET BY MOUTH DAILY. 01/06/21   Sindy Guadeloupe, MD  prochlorperazine (COMPAZINE) 10 MG tablet Take 1 tablet (10 mg total) by mouth every 6 (six) hours as needed (Nausea or vomiting). 12/06/20   Sindy Guadeloupe, MD  sertraline (ZOLOFT) 50 MG tablet Take 1 tablet (50 mg total) by mouth daily. Patient not taking: No sig reported 12/06/20   Sindy Guadeloupe, MD     ALLERGIES:  No Known Allergies   SOCIAL HISTORY:  Social History   Socioeconomic History  . Marital status: Single    Spouse name: Not on file  . Number of children: Not on file  . Years of education: Not on file  . Highest education level: Not on file  Occupational History  . Not on file  Tobacco Use  . Smoking status: Current Every Day Smoker    Packs/day: 0.50  . Smokeless tobacco: Never Used  Vaping Use  . Vaping Use: Never used  Substance and Sexual Activity  . Alcohol use: Yes    Comment: once a week a beer or  2  . Drug use: Not on file  . Sexual activity: Not Currently  Other Topics Concern  . Not on file  Social History Narrative  . Not on file   Social Determinants of Health   Financial Resource Strain: Not on file  Food Insecurity: Not on file  Transportation Needs: Not on file  Physical Activity: Not on file  Stress: Not on  file  Social Connections: Not on file  Intimate Partner Violence: Not on file     FAMILY HISTORY:  Family History  Problem Relation Age of Onset  . Cancer Father       REVIEW OF SYSTEMS:  Review of Systems  Constitutional: Negative for chills and fever.  Respiratory: Negative for cough and shortness of breath.   Cardiovascular: Negative for chest pain and palpitations.  Gastrointestinal: Negative for abdominal pain, diarrhea, nausea and vomiting.  Genitourinary: Negative for dysuria and urgency.  Psychiatric/Behavioral: Positive for depression. Negative for hallucinations.  All other systems reviewed and are negative.   VITAL SIGNS:  Temp:  [98.2 F (36.8 C)-98.3 F (36.8 C)] 98.2 F (36.8 C) (04/21 0837) Pulse Rate:  [  82-95] 82 (04/21 0837) Resp:  [17-20] 17 (04/21 0837) BP: (121-132)/(82-93) 123/93 (04/21 0837) SpO2:  [98 %-99 %] 99 % (04/21 0837)     Height: 5\' 9"  (175.3 cm) Weight: 63.5 kg BMI (Calculated): 20.67   INTAKE/OUTPUT:  No intake/output data recorded.  PHYSICAL EXAM:  Physical Exam Vitals and nursing note reviewed. Exam conducted with a chaperone present.  Constitutional:      General: He is not in acute distress.    Appearance: Normal appearance. He is normal weight. He is not ill-appearing.  HENT:     Head: Normocephalic and atraumatic.  Eyes:     General: No scleral icterus.    Conjunctiva/sclera: Conjunctivae normal.  Cardiovascular:     Rate and Rhythm: Normal rate.     Pulses: Normal pulses.  Pulmonary:     Effort: Pulmonary effort is normal. No respiratory distress.  Chest:    Abdominal:     General: A surgical scar is present. There is no distension.     Palpations: Abdomen is soft.     Tenderness: There is no abdominal tenderness.    Genitourinary:    Comments: Deferred Musculoskeletal:     Right lower leg: No edema.     Left lower leg: No edema.  Skin:    General: Skin is warm and dry.  Neurological:     General: No focal  deficit present.     Mental Status: He is alert and oriented to person, place, and time.      Labs:  CBC Latest Ref Rng & Units 01/28/2021 12/27/2020 12/20/2020  WBC 4.0 - 10.5 K/uL 6.9 10.2 11.1(H)  Hemoglobin 13.0 - 17.0 g/dL 14.3 11.4(L) 12.7(L)  Hematocrit 39.0 - 52.0 % 39.4 31.1(L) 34.5(L)  Platelets 150 - 400 K/uL 314 177 319   CMP Latest Ref Rng & Units 01/28/2021 12/27/2020 12/20/2020  Glucose 70 - 99 mg/dL 103(H) 127(H) 99  BUN 6 - 20 mg/dL 9 27(H) 20  Creatinine 0.61 - 1.24 mg/dL 0.75 0.77 0.71  Sodium 135 - 145 mmol/L 137 130(L) 129(L)  Potassium 3.5 - 5.1 mmol/L 4.6 4.2 3.9  Chloride 98 - 111 mmol/L 98 94(L) 93(L)  CO2 22 - 32 mmol/L 30 29 26   Calcium 8.9 - 10.3 mg/dL 9.3 8.9 8.8(L)  Total Protein 6.5 - 8.1 g/dL 7.6 - -  Total Bilirubin 0.3 - 1.2 mg/dL 0.8 - -  Alkaline Phos 38 - 126 U/L 64 - -  AST 15 - 41 U/L 27 - -  ALT 0 - 44 U/L 19 - -     Imaging studies:  No pertinent imaging studies   Assessment/Plan: (ICD-10's: F32.2) 50 y.o. male with history of oropharyngeal cancer with port-a-catheter/gastrostomy tube admitted with depression pending psychiatric placement   - Case was discussed extensively between Dr Janese Banks (oncology), Dr Weber Cooks (psychiatry), and Dr Hampton Abbot (surgery) and determined it is reasonable to remove port-a-catheter and gastrostomy tube to allow for placement to psychiatric ward to allow him to improve from mental health standpoint. We will tentatively plan for removal of these in the operating room tomorrow with Dr Kirke Corin.   - All risks, benefits, and alternatives to above procedure(s) were discussed with the patient, all of his questions were answered to his expressed satisfaction, patient expresses he wishes to proceed, and informed consent was obtained.  - NPO at midnight  - Further management per primary service   All of the above findings and recommendations were discussed with the patient, and all of patient's questions  were answered to his  expressed satisfaction.  Thank you for the opportunity to participate in this patient's care.   -- Edison Simon, PA-C [facility] 02/03/2021, 12:28 PM [phone] M-F: 7am - 4pm

## 2021-02-03 NOTE — TOC Progression Note (Signed)
Transition of Care [facility]) - Progression Note    Patient Details  Name: Paul Hebert MRN: [id number] Date of Birth: 1971-07-15  Transition of Care [facility]) CM/SW [place], [place] Phone Number: [phone] 02/03/2021, 3:12 PM  Clinical Narrative:     Oncologist and Surgeon were consulted to discuss removal of port-a-catheter and gastrostomy tube, to allow for patient placement in inpatient psych care, requested by Dr. Weber Cooks.  Surgery plans to remove port-a-catheter and gastrostomy tube, on 02/04/2021.  Patient should then be eligible for inpatient psychiatric treatment.  TOC will sign off on this patient.      Barriers to Discharge: Psych Bed not available  Expected Discharge Plan and Services                                                 Social Determinants of Health (SDOH) Interventions    Readmission Risk Interventions No flowsheet data found.

## 2021-02-03 NOTE — ED Notes (Signed)
No v-signs at this time patient sleeping

## 2021-02-03 NOTE — Consult Note (Signed)
Hematology/Oncology Consult note [facility] [phone] [phone]  Patient Care Team: Pcp, No as PCP - General   Name of the patient: Paul Hebert  [id number]  1971-09-20    Reason for consult: History of head and neck cancer now admitted for suicidal ideation   Requesting physician: Dr. 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  He was receiving concurrent chemoradiation with last dose of chemotherapy given on 12/13/2020.  Following that his chemotherapy was held due to worsening mucositis.  Patient had ongoing social issues and informed us that he would be going to [place] due to which his radiation was kept on hold for a while and then was resumed.  He is still undergoing  radiation treatment and that will be ending on 02/16/2021.patient is currently in the ER and endorses suicidal ideations and has been evaluated by psychiatry Dr. Weber Cooks.  He could potentially be a candidate for inpatient psychiatric admission but he would need his port and PEG tube taken out prior to that.  ECOG PS- 1  Pain scale- 0   Review of systems- Review of Systems  Psychiatric/Behavioral: Positive for depression and suicidal ideas.    No Known Allergies  Patient Active Problem List   Diagnosis Date Noted  . Cocaine abuse ([place]) 01/28/2021  . Alcohol abuse 01/28/2021  . Severe major depression, single episode ([place]) 01/28/2021  . Squamous cell carcinoma of oropharynx ([place]) 10/18/2020  . Goals of care, counseling/discussion 10/18/2020     Past Medical History:  Diagnosis Date  . 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 PORTA CATH INSERTION N/A 10/25/2020   Procedure: PORTA CATH INSERTION;  Surgeon: Algernon Huxley, MD;  Location: [facility];  Service: Cardiovascular;   Laterality: N/A;    Social History   Socioeconomic History  . Marital status: Single    Spouse name: Not on file  . Number of children: Not on file  . Years of education: Not on file  . Highest education level: Not on file  Occupational History  . Not on file  Tobacco Use  . Smoking status: Current Every Day Smoker    Packs/day: 0.50  . Smokeless tobacco: Never Used  Vaping Use  . Vaping Use: Never used  Substance and Sexual Activity  . Alcohol use: Yes    Comment: once a week a beer or  2  . Drug use: Not on file  . Sexual activity: Not Currently  Other Topics Concern  . Not on file  Social History Narrative  . Not on file   Social Determinants of Health   Financial Resource Strain: Not on file  Food Insecurity: Not on file  Transportation Needs: Not on file  Physical Activity: Not on file  Stress: Not on file  Social Connections: Not on file  Intimate Partner Violence: Not on file     Family History  Problem Relation Age of Onset  . Cancer Father      Current Facility-Administered Medications:  .  escitalopram (LEXAPRO) tablet 10 mg, 10 mg, Oral, Daily, Ward, Kristen N, DO, 10 mg at 02/03/21 0949 .  fentaNYL (DURAGESIC) 12 MCG/HR 1 patch, 1 patch, Transdermal, Q72H, Ward, Kristen N, DO, 1 patch at 02/02/21 1151 .  fentaNYL (DURAGESIC) 25 MCG/HR 1 patch, 1 patch, Transdermal, Q72H, Ward,  Kristen N, DO, 1 patch at 02/02/21 1029 .  gabapentin (NEURONTIN) capsule 300 mg, 300 mg, Oral, TID, Ward, Kristen N, DO, 300 mg at 02/03/21 1514 .  loperamide (IMODIUM) capsule 2 mg, 2 mg, Oral, PRN, Ward, Kristen N, DO .  LORazepam (ATIVAN) tablet 0.5 mg, 0.5 mg, Oral, Q8H, Ward, Kristen N, DO, 0.5 mg at 02/03/21 1514 .  neomycin-bacitracin-polymyxin (NEOSPORIN) ointment, , Topical, PRN, Nance Pear, MD .  nicotine (NICODERM CQ - dosed in mg/24 hours) patch 14 mg, 14 mg, Transdermal, Daily, Lucrezia Starch, MD, 14 mg at 02/03/21 0951 .  ondansetron (ZOFRAN) tablet 8 mg,  8 mg, Oral, Q8H PRN, Ward, Kristen N, DO .  oxyCODONE (Oxy IR/ROXICODONE) immediate release tablet 20 mg, 20 mg, Oral, Q4H PRN, Ward, Kristen N, DO .  pantoprazole (PROTONIX) EC tablet 20 mg, 20 mg, Oral, Daily, Ward, Kristen N, DO, 20 mg at 02/03/21 0949 .  prochlorperazine (COMPAZINE) tablet 10 mg, 10 mg, Oral, Q6H PRN, Ward, Delice Bison, DO  Current Outpatient Medications:  .  amoxicillin-clavulanate (AUGMENTIN) 875-125 MG tablet, Take 1 tablet by mouth 2 (two) times daily., Disp: 14 tablet, Rfl: 0 .  COVID-19 mRNA vaccine, Pfizer, 30 MCG/0.3ML injection, AS DIRECTED, Disp: .3 mL, Rfl: 0 .  dexamethasone (DECADRON) 4 MG tablet, Take 2 tablets (8 mg total) by mouth daily. Take daily x 3 days starting the day after cisplatin chemotherapy. Take with food. (Patient taking differently: Take 8 mg by mouth See admin instructions. Take 8 mg daily x 3 days starting the day after cisplatin chemotherapy. Take with food.), Disp: 30 tablet, Rfl: 1 .  escitalopram (LEXAPRO) 10 MG tablet, Take 1 tablet (10 mg total) by mouth daily. Take half pill per day for 5 days then increase to one pill per day, Disp: 30 tablet, Rfl: 1 .  fentaNYL (DURAGESIC) 12 MCG/HR, Place 1 patch onto the skin every 3 (three) days. This is additonal to the 25 mcg to make 37 mcg total, Disp: 10 patch, Rfl: 0 .  fentaNYL (DURAGESIC) 25 MCG/HR, Place 1 patch onto the skin every 3 (three) days. This is additonal to the12 mcg to make 37 mcg total, Disp: 10 patch, Rfl: 0 .  fluconazole (DIFLUCAN) 100 MG tablet, Take 1 tablet (100 mg total) by mouth daily., Disp: 7 tablet, Rfl: 0 .  gabapentin (NEURONTIN) 300 MG capsule, TAKE 1 CAPSULE (300 MG) BY MOUTH 3 TIMESDAILY, Disp: 90 capsule, Rfl: 2 .  lidocaine-prilocaine (EMLA) cream, Apply to affected area once (Patient taking differently: Apply 1 application topically daily as needed (port access).), Disp: 30 g, Rfl: 3 .  loperamide (IMODIUM) 2 MG capsule, Take 1 capsule (2 mg total) by mouth as  needed for diarrhea or loose stools (take 2 tab with first loose stool, then 1 tab. after every loose stool after that and maximum 8 a day)., Disp: 48 capsule, Rfl: 0 .  LORazepam (ATIVAN) 0.5 MG tablet, Take 1 tablet (0.5 mg total) by mouth every 8 (eight) hours., Disp: 60 tablet, Rfl: 0 .  Nutritional Supplements (FEEDING SUPPLEMENT, OSMOLITE 1.5 CAL,) LIQD, Give 1 1/2 cartons of formula 4 times per day via feeding tube (8am, noon, 4pm and 8pm). Flush with 46ml of water before and after each feeding. Drink or give additional 276ml water TID between feedings via tube for better hydration., Disp: 1422 mL, Rfl: 0 .  nystatin (MYCOSTATIN) 100000 UNIT/ML suspension, Take 5 mLs (500,000 Units total) by mouth 4 (four) times daily., Disp: 473 mL, Rfl:  1 .  ondansetron (ZOFRAN) 8 MG tablet, Take 1 tablet (8 mg total) by mouth 2 (two) times daily as needed. Start on the third day after cisplatin chemotherapy., Disp: 30 tablet, Rfl: 1 .  Oxycodone HCl 20 MG TABS, Take 1 tablet (20 mg total) by mouth every 4 (four) hours as needed., Disp: 180 tablet, Rfl: 0 .  pantoprazole (PROTONIX) 20 MG tablet, TAKE 1 TABLET BY MOUTH DAILY., Disp: 30 tablet, Rfl: 1 .  prochlorperazine (COMPAZINE) 10 MG tablet, Take 1 tablet (10 mg total) by mouth every 6 (six) hours as needed (Nausea or vomiting)., Disp: 30 tablet, Rfl: 1 .  sertraline (ZOLOFT) 50 MG tablet, Take 1 tablet (50 mg total) by mouth daily. (Patient not taking: No sig reported), Disp: 30 tablet, Rfl: 2   Physical exam:  Vitals:   02/02/21 0958 02/02/21 1815 02/03/21 0021 02/03/21 0837  BP: (!) 122/94 121/82 132/82 (!) 123/93  Pulse: 86 95 83 82  Resp: 16 20 17 17   Temp: 98 F (36.7 C) 98.3 F (36.8 C) 98.2 F (36.8 C) 98.2 F (36.8 C)  TempSrc: Oral Oral Oral Oral  SpO2: 100% 98% 98% 99%  Weight:      Height:       Physical Exam Constitutional:      Comments: He is tearful and sobbing continously  Cardiovascular:     Rate and Rhythm: Normal rate.      Heart sounds: Normal heart sounds.  Pulmonary:     Effort: Pulmonary effort is normal.  Abdominal:     Comments: Peg tube in place  Skin:    General: Skin is warm and dry.  Neurological:     Mental Status: He is alert and oriented to person, place, and time.        CMP Latest Ref Rng & Units 01/28/2021  Glucose 70 - 99 mg/dL 103(H)  BUN 6 - 20 mg/dL 9  Creatinine 0.61 - 1.24 mg/dL 0.75  Sodium 135 - 145 mmol/L 137  Potassium 3.5 - 5.1 mmol/L 4.6  Chloride 98 - 111 mmol/L 98  CO2 22 - 32 mmol/L 30  Calcium 8.9 - 10.3 mg/dL 9.3  Total Protein 6.5 - 8.1 g/dL 7.6  Total Bilirubin 0.3 - 1.2 mg/dL 0.8  Alkaline Phos 38 - 126 U/L 64  AST 15 - 41 U/L 27  ALT 0 - 44 U/L 19   CBC Latest Ref Rng & Units 01/28/2021  WBC 4.0 - 10.5 K/uL 6.9  Hemoglobin 13.0 - 17.0 g/dL 14.3  Hematocrit 39.0 - 52.0 % 39.4  Platelets 150 - 400 K/uL 314    @IMAGES @  No results found.  Assessment and plan- Patient is a 50 y.o. 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 He was supposed to continue radiation for 10 more days. He is unsure if he wants to continue radiation at this time. He is heartbroken and depressed after his fiance left him. He endorses suicidal ideations.    It would therefore be okay to proceed with port removal and PEG tube removal at this time. Patient has not used his peg tube in over a month now.  I did discuss with surgery Dr. Hampton Abbot and he is agreeable to do this tomorrow.    Visit Diagnosis 1. Suicidal ideation  Dr. Randa Evens, MD, MPH [facility] [phone] 02/03/2021 9:11 PM

## 2021-02-03 NOTE — ED Notes (Signed)
Hourly rounding performed, patient currently asleep in room. Patient has no complaints at this time. Q15 minute rounds and monitoring via [employer] to continue.

## 2021-02-03 NOTE — ED Notes (Signed)
Hourly rounding performed, patient currently awake in room. Patient has no complaints at this time. Q15 minute rounds and monitoring via Security Cameras to continue. 

## 2021-02-03 NOTE — ED Notes (Signed)
Report received from Ariel, RN including SBAR. Patient alert and oriented, warm and dry, in no acute distress. Patient denies SI, HI, AVH and pain. Patient made aware of Q15 minute rounds and security cameras for their safety. Patient instructed to come to this nurse with needs or concerns.  

## 2021-02-04 ENCOUNTER — Emergency Department: Payer: Medicaid Other | Admitting: Anesthesiology

## 2021-02-04 ENCOUNTER — Other Ambulatory Visit: Payer: Self-pay

## 2021-02-04 ENCOUNTER — Ambulatory Visit: Payer: Medicaid Other

## 2021-02-04 ENCOUNTER — Inpatient Hospital Stay
Admission: EM | Admit: 2021-02-04 | Discharge: 2021-02-17 | DRG: 885 | Disposition: A | Discharge status: Home / Self Care | Payer: 59 | Source: Ambulatory Visit | Attending: Behavioral Health | Admitting: Behavioral Health

## 2021-02-04 ENCOUNTER — Encounter
Admission: EM | Disposition: A | Discharge status: Home / Self Care | Payer: Self-pay | Source: Home / Self Care | Attending: Emergency Medicine

## 2021-02-04 ENCOUNTER — Encounter: Payer: Self-pay | Admitting: *Deleted

## 2021-02-04 DIAGNOSIS — Z5900 Homelessness unspecified: Secondary | ICD-10-CM | POA: Diagnosis not present

## 2021-02-04 DIAGNOSIS — F1410 Cocaine abuse, uncomplicated: Secondary | ICD-10-CM | POA: Diagnosis present

## 2021-02-04 DIAGNOSIS — Z20822 Contact with and (suspected) exposure to covid-19: Secondary | ICD-10-CM | POA: Diagnosis present

## 2021-02-04 DIAGNOSIS — K219 Gastro-esophageal reflux disease without esophagitis: Secondary | ICD-10-CM | POA: Diagnosis present

## 2021-02-04 DIAGNOSIS — Z85818 Personal history of malignant neoplasm of other sites of lip, oral cavity, and pharynx: Secondary | ICD-10-CM | POA: Diagnosis not present

## 2021-02-04 DIAGNOSIS — R45851 Suicidal ideations: Secondary | ICD-10-CM | POA: Diagnosis present

## 2021-02-04 DIAGNOSIS — F17210 Nicotine dependence, cigarettes, uncomplicated: Secondary | ICD-10-CM | POA: Diagnosis present

## 2021-02-04 DIAGNOSIS — Z931 Gastrostomy status: Secondary | ICD-10-CM

## 2021-02-04 DIAGNOSIS — Z452 Encounter for adjustment and management of vascular access device: Secondary | ICD-10-CM

## 2021-02-04 DIAGNOSIS — F332 Major depressive disorder, recurrent severe without psychotic features: Secondary | ICD-10-CM | POA: Diagnosis present

## 2021-02-04 DIAGNOSIS — F322 Major depressive disorder, single episode, severe without psychotic features: Secondary | ICD-10-CM | POA: Diagnosis present

## 2021-02-04 DIAGNOSIS — Z510 Encounter for antineoplastic radiation therapy: Secondary | ICD-10-CM | POA: Diagnosis not present

## 2021-02-04 DIAGNOSIS — F1010 Alcohol abuse, uncomplicated: Secondary | ICD-10-CM | POA: Diagnosis present

## 2021-02-04 DIAGNOSIS — Z95828 Presence of other vascular implants and grafts: Secondary | ICD-10-CM

## 2021-02-04 DIAGNOSIS — C109 Malignant neoplasm of oropharynx, unspecified: Secondary | ICD-10-CM | POA: Diagnosis present

## 2021-02-04 HISTORY — PX: REMOVAL OF GASTROSTOMY TUBE: SHX6058

## 2021-02-04 HISTORY — PX: PORT-A-CATH REMOVAL: SHX5289

## 2021-02-04 LAB — URINE DRUG SCREEN, QUALITATIVE (ARMC ONLY)
Amphetamines, Ur Screen: NOT DETECTED
Barbiturates, Ur Screen: NOT DETECTED
Benzodiazepine, Ur Scrn: NOT DETECTED
Cannabinoid 50 Ng, Ur ~~LOC~~: NOT DETECTED
Cocaine Metabolite,Ur ~~LOC~~: NOT DETECTED
MDMA (Ecstasy)Ur Screen: NOT DETECTED
Methadone Scn, Ur: NOT DETECTED
Opiate, Ur Screen: NOT DETECTED
Phencyclidine (PCP) Ur S: NOT DETECTED
Tricyclic, Ur Screen: NOT DETECTED

## 2021-02-04 LAB — RESP PANEL BY RT-PCR (FLU A&B, COVID) ARPGX2
Influenza A by PCR: NEGATIVE
Influenza B by PCR: NEGATIVE
SARS Coronavirus 2 by RT PCR: NEGATIVE

## 2021-02-04 SURGERY — REMOVAL PORT-A-CATH
Anesthesia: General

## 2021-02-04 MED ORDER — BACITRACIN-NEOMYCIN-POLYMYXIN 400-5-5000 EX OINT
TOPICAL_OINTMENT | Freq: Every day | CUTANEOUS | Status: DC
  Filled 2021-02-04 (×2): qty 1

## 2021-02-04 MED ORDER — CEFAZOLIN SODIUM-DEXTROSE 2-4 GM/100ML-% IV SOLN
2.0000 g | Freq: Once | INTRAVENOUS | Status: AC
  Administered 2021-02-04: 2 g via INTRAVENOUS

## 2021-02-04 MED ORDER — FENTANYL CITRATE (PF) 100 MCG/2ML IJ SOLN: 25.0000 ug | INTRAMUSCULAR | Status: DC | PRN

## 2021-02-04 MED ORDER — ORAL CARE MOUTH RINSE
15.0000 mL | Freq: Once | OROMUCOSAL | Status: AC
Start: 2021-02-04 — End: 2021-02-04

## 2021-02-04 MED ORDER — MIDAZOLAM HCL 2 MG/2ML IJ SOLN
INTRAMUSCULAR | Status: AC
  Filled 2021-02-04: qty 2

## 2021-02-04 MED ORDER — DEXMEDETOMIDINE HCL 200 MCG/2ML IV SOLN
INTRAVENOUS | Status: DC | PRN
  Administered 2021-02-04 (×2): 10 ug via INTRAVENOUS

## 2021-02-04 MED ORDER — PROPOFOL 500 MG/50ML IV EMUL
INTRAVENOUS | Status: DC | PRN
  Administered 2021-02-04: 125 ug/kg/min via INTRAVENOUS

## 2021-02-04 MED ORDER — ONDANSETRON HCL 4 MG/2ML IJ SOLN
INTRAMUSCULAR | Status: DC | PRN
  Administered 2021-02-04: 4 mg via INTRAVENOUS

## 2021-02-04 MED ORDER — KETAMINE HCL 50 MG/5ML IJ SOSY
PREFILLED_SYRINGE | INTRAMUSCULAR | Status: AC
  Filled 2021-02-04: qty 5

## 2021-02-04 MED ORDER — CHLORHEXIDINE GLUCONATE 0.12 % MT SOLN: 15.0000 mL | Freq: Once | OROMUCOSAL | Status: AC

## 2021-02-04 MED ORDER — ONDANSETRON HCL 4 MG/2ML IJ SOLN: 4.0000 mg | Freq: Once | INTRAMUSCULAR | Status: DC | PRN

## 2021-02-04 MED ORDER — CHLORHEXIDINE GLUCONATE 0.12 % MT SOLN
OROMUCOSAL | Status: AC
  Administered 2021-02-04: 15 mL via OROMUCOSAL
  Filled 2021-02-04: qty 15

## 2021-02-04 MED ORDER — FENTANYL CITRATE (PF) 100 MCG/2ML IJ SOLN
INTRAMUSCULAR | Status: DC | PRN
  Administered 2021-02-04 (×2): 50 ug via INTRAVENOUS

## 2021-02-04 MED ORDER — BACITRACIN-NEOMYCIN-POLYMYXIN 400-5-5000 EX OINT
TOPICAL_OINTMENT | CUTANEOUS | Status: AC
  Filled 2021-02-04: qty 1

## 2021-02-04 MED ORDER — ESCITALOPRAM OXALATE 10 MG PO TABS
10.0000 mg | ORAL_TABLET | Freq: Every day | ORAL | Status: DC
  Administered 2021-02-04: 10 mg via ORAL

## 2021-02-04 MED ORDER — MIDAZOLAM HCL 2 MG/2ML IJ SOLN
INTRAMUSCULAR | Status: DC | PRN
  Administered 2021-02-04: 2 mg via INTRAVENOUS

## 2021-02-04 MED ORDER — FENTANYL CITRATE (PF) 100 MCG/2ML IJ SOLN
INTRAMUSCULAR | Status: AC
  Filled 2021-02-04: qty 2

## 2021-02-04 MED ORDER — BUPIVACAINE-EPINEPHRINE 0.5% -1:200000 IJ SOLN
INTRAMUSCULAR | Status: DC | PRN
  Administered 2021-02-04: 15 mL

## 2021-02-04 MED ORDER — LACTATED RINGERS IV SOLN: INTRAVENOUS | Status: DC

## 2021-02-04 MED ORDER — KETAMINE HCL 10 MG/ML IJ SOLN
INTRAMUSCULAR | Status: DC | PRN
  Administered 2021-02-04: 10 mg via INTRAVENOUS
  Administered 2021-02-04 (×2): 20 mg via INTRAVENOUS

## 2021-02-04 MED ORDER — CEFAZOLIN SODIUM-DEXTROSE 2-4 GM/100ML-% IV SOLN
INTRAVENOUS | Status: AC
  Filled 2021-02-04: qty 100

## 2021-02-04 MED ORDER — BUPIVACAINE-EPINEPHRINE (PF) 0.5% -1:200000 IJ SOLN
INTRAMUSCULAR | Status: AC
  Filled 2021-02-04: qty 30

## 2021-02-04 SURGICAL SUPPLY — 41 items
ADH SKN CLS APL DERMABOND .7 (GAUZE/BANDAGES/DRESSINGS) ×1
APL PRP STRL LF DISP 70% ISPRP (MISCELLANEOUS) ×1
BAG BIOHAZARD 6X9 CLR ZIPLOCK (MISCELLANEOUS) ×2 IMPLANT
BAG SPEC THK2 9X6 BHZR CLR (MISCELLANEOUS) ×1
BLADE SURG 15 STRL LF DISP TIS (BLADE) ×1 IMPLANT
BLADE SURG 15 STRL SS (BLADE) ×2
CANISTER SUCT 1200ML W/VALVE (MISCELLANEOUS) ×2 IMPLANT
CHLORAPREP W/TINT 26 (MISCELLANEOUS) ×2 IMPLANT
COVER WAND RF STERILE (DRAPES) ×2 IMPLANT
DERMABOND ADVANCED (GAUZE/BANDAGES/DRESSINGS) ×1
DERMABOND ADVANCED .7 DNX12 (GAUZE/BANDAGES/DRESSINGS) ×1 IMPLANT
DRAPE LAPAROTOMY 77X122 PED (DRAPES) ×2 IMPLANT
ELECT CAUTERY BLADE 6.4 (BLADE) ×2 IMPLANT
ELECT REM PT RETURN 9FT ADLT (ELECTROSURGICAL) ×2
ELECTRODE REM PT RTRN 9FT ADLT (ELECTROSURGICAL) ×1 IMPLANT
GLOVE SURG SYN 7.0 (GLOVE) ×2 IMPLANT
GLOVE SURG SYN 7.5  E (GLOVE) ×1
GLOVE SURG SYN 7.5 E (GLOVE) ×1 IMPLANT
GOWN STRL REUS W/ TWL LRG LVL3 (GOWN DISPOSABLE) ×2 IMPLANT
GOWN STRL REUS W/TWL LRG LVL3 (GOWN DISPOSABLE) ×4
KIT TURNOVER KIT A (KITS) ×2 IMPLANT
LABEL OR SOLS (LABEL) ×2 IMPLANT
MANIFOLD NEPTUNE II (INSTRUMENTS) ×2 IMPLANT
NEEDLE HYPO 22GX1.5 SAFETY (NEEDLE) ×2 IMPLANT
NEEDLE HYPO 25X1 1.5 SAFETY (NEEDLE) ×2 IMPLANT
NS IRRIG 500ML POUR BTL (IV SOLUTION) ×2 IMPLANT
PACK BASIN MINOR ARMC (MISCELLANEOUS) ×2 IMPLANT
PACK LAP CHOLECYSTECTOMY (MISCELLANEOUS) ×2 IMPLANT
PENCIL ELECTRO HAND CTR (MISCELLANEOUS) ×2 IMPLANT
SET TUBE SMOKE EVAC HIGH FLOW (TUBING) ×2 IMPLANT
SLEEVE ADV FIXATION 5X100MM (TROCAR) IMPLANT
SUT MNCRL 4-0 (SUTURE) ×2
SUT MNCRL 4-0 27XMFL (SUTURE) ×1
SUT VIC AB 3-0 SH 27 (SUTURE) ×2
SUT VIC AB 3-0 SH 27X BRD (SUTURE) ×1 IMPLANT
SUT VICRYL 0 AB UR-6 (SUTURE) ×2 IMPLANT
SUTURE MNCRL 4-0 27XMF (SUTURE) ×1 IMPLANT
SYR 10ML LL (SYRINGE) ×2 IMPLANT
SYS KII FIOS ACCESS ABD 5X100 (TROCAR) ×2
SYSTEM KII FIOS ACES ABD 5X100 (TROCAR) ×1 IMPLANT
TROCAR BALLN GELPORT 12X130M (ENDOMECHANICALS) ×2 IMPLANT

## 2021-02-04 NOTE — ED Notes (Signed)
Pt is NPO at this time, pt is aware and understands this

## 2021-02-04 NOTE — ED Notes (Signed)
Hourly rounding performed, patient currently asleep in room. Patient has no complaints at this time. Q15 minute rounds and monitoring via [employer] to continue.

## 2021-02-04 NOTE — ED Notes (Signed)
Pt given meal tray.

## 2021-02-04 NOTE — BH Assessment (Signed)
PATIENT BED AVAILABLE AFTER 10:30PM  Patient is to be admitted to [facility] BMU by Dr. Weber Cooks.  Attending Physician will be Dr. Domingo Cocking.   Patient has been assigned to room 310.   Intake Paper Work has been signed and placed on patient chart.  ER staff is aware of the admission:  Melody ER Secretary    Dr. Archie Balboa, ER MD   Maudie Mercury Patient's Nurse   Earlie Server Patient Access.

## 2021-02-04 NOTE — ED Notes (Signed)
Pt given ice cream and graham crackers

## 2021-02-04 NOTE — ED Notes (Signed)
Pt asleep at this time, unable to collect vitals. Will collect pt vitals once awake. 

## 2021-02-04 NOTE — ED Notes (Signed)
Pt given lemon line soda with meds. Pt asking to speak with doctor. Pt informed that he is currently rounding on pts and will be around shortly.

## 2021-02-04 NOTE — Consult Note (Signed)
[facility] Face-to-Face Psychiatry Consult   Reason for Consult: Consult follow-up with this 50 year old man with depression and substance abuse Referring Physician: Archie Balboa Patient Identification: Paul Hebert MRN:  [id number] Principal Diagnosis: Severe major depression, single episode ([place]) Diagnosis:  Principal Problem:   Severe major depression, single episode ([place]) Active Problems:   Cocaine abuse ([place])   Alcohol abuse   Port-A-Cath in place   Gastrostomy tube in place [facility])   Total Time spent with patient: 30 minutes  Subjective:   Paul Hebert is a 50 y.o. male patient admitted with "I am doing okay".  HPI: Patient had surgery today to remove his Port-A-Cath and remove his PEG tube.  Both without complication.  The only follow-up that is needed is daily superficial dressing changes on the abdominal wound no other nursing care needed.  Patient is not hostile or acting out but continues to be depressed with suicidal ideation  Past Psychiatric History: Past history of substance abuse and depression  Risk to Self:   Risk to Others:   Prior Inpatient Therapy:   Prior Outpatient Therapy:    Past Medical History:  Past Medical History:  Diagnosis Date  . 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 PORTA CATH INSERTION N/A 10/25/2020   Procedure: PORTA CATH INSERTION;  Surgeon: Algernon Huxley, MD;  Location: [facility];  Service: Cardiovascular;  Laterality: N/A;   Family History:  Family History  Problem Relation Age of Onset  . Cancer Father    Family Psychiatric  History: See previous Social History:  Social History   Substance and Sexual Activity  Alcohol Use Yes   Comment: once a week a beer or  2     Social History   Substance and Sexual Activity  Drug Use Not on file    Social History   Socioeconomic History  . Marital status: Single    Spouse name: Not on file  . Number of children: Not on file  . Years of  education: Not on file  . Highest education level: Not on file  Occupational History  . Not on file  Tobacco Use  . Smoking status: Current Every Day Smoker    Packs/day: 0.50  . Smokeless tobacco: Never Used  Vaping Use  . Vaping Use: Never used  Substance and Sexual Activity  . Alcohol use: Yes    Comment: once a week a beer or  2  . Drug use: Not on file  . Sexual activity: Not Currently  Other Topics Concern  . Not on file  Social History Narrative  . Not on file   Social Determinants of Health   Financial Resource Strain: Not on file  Food Insecurity: Not on file  Transportation Needs: Not on file  Physical Activity: Not on file  Stress: Not on file  Social Connections: Not on file   Additional Social History:    Allergies:  No Known Allergies  Labs:  Results for orders placed or performed during the hospital encounter of 01/28/21 (from the past 48 hour(s))  Urine Drug Screen, Qualitative ([place] only)     Status: None   Collection Time: 02/04/21  8:02 AM  Result Value Ref Range   Tricyclic, Ur Screen NONE DETECTED NONE DETECTED   Amphetamines, Ur Screen NONE DETECTED NONE DETECTED   MDMA (Ecstasy)Ur Screen NONE DETECTED NONE DETECTED   Cocaine Metabolite,Ur [place] NONE DETECTED NONE DETECTED   Opiate, Ur Screen  NONE DETECTED NONE DETECTED   Phencyclidine (PCP) Ur S NONE DETECTED NONE DETECTED   Cannabinoid 50 Ng, Ur [place] NONE DETECTED NONE DETECTED   Barbiturates, Ur Screen NONE DETECTED NONE DETECTED   Benzodiazepine, Ur Scrn NONE DETECTED NONE DETECTED   Methadone Scn, Ur NONE DETECTED NONE DETECTED    Comment: (NOTE) Tricyclics + metabolites, urine    Cutoff 1000 ng/mL Amphetamines + metabolites, urine  Cutoff 1000 ng/mL MDMA (Ecstasy), urine              Cutoff 500 ng/mL Cocaine Metabolite, urine          Cutoff 300 ng/mL Opiate + metabolites, urine        Cutoff 300 ng/mL Phencyclidine (PCP), urine         Cutoff 25 ng/mL Cannabinoid, urine                  Cutoff 50 ng/mL Barbiturates + metabolites, urine  Cutoff 200 ng/mL Benzodiazepine, urine              Cutoff 200 ng/mL Methadone, urine                   Cutoff 300 ng/mL  The urine drug screen provides only a preliminary, unconfirmed analytical test result and should not be used for non-medical purposes. Clinical consideration and professional judgment should be applied to any positive drug screen result due to possible interfering substances. A more specific alternate chemical method must be used in order to obtain a confirmed analytical result. Gas chromatography / mass spectrometry (GC/MS) is the preferred confirm atory method. Performed at [facility], [address] [place], [place] [address]     Current Facility-Administered Medications  Medication Dose Route Frequency Provider Last Rate Last Admin  . escitalopram (LEXAPRO) tablet 10 mg  10 mg Oral Daily Ward, Kristen N, DO   10 mg at 02/03/21 0949  . escitalopram (LEXAPRO) tablet 10 mg  10 mg Oral Daily Tiffnay Bossi T, MD      . fentaNYL (DURAGESIC) 12 MCG/HR 1 patch  1 patch Transdermal Q72H Ward, Kristen N, DO   1 patch at 02/02/21 1151  . fentaNYL (DURAGESIC) 25 MCG/HR 1 patch  1 patch Transdermal Q72H Ward, Kristen N, DO   1 patch at 02/02/21 1029  . gabapentin (NEURONTIN) capsule 300 mg  300 mg Oral TID Ward, Kristen N, DO   300 mg at 02/04/21 1524  . loperamide (IMODIUM) capsule 2 mg  2 mg Oral PRN Ward, Kristen N, DO      . LORazepam (ATIVAN) tablet 0.5 mg  0.5 mg Oral Q8H Ward, Kristen N, DO   0.5 mg at 02/04/21 1524  . neomycin-bacitracin-polymyxin (NEOSPORIN) ointment   Topical PRN Nance Pear, MD      . neomycin-bacitracin-polymyxin (NEOSPORIN) ointment   Topical Daily Makensey Rego, Madie Reno, MD      . nicotine (NICODERM CQ - dosed in mg/24 hours) patch 14 mg  14 mg Transdermal Daily Lucrezia Starch, MD   14 mg at 02/04/21 1029  . ondansetron (ZOFRAN) tablet 8 mg  8 mg Oral Q8H PRN Ward, Kristen N, DO       . oxyCODONE (Oxy IR/ROXICODONE) immediate release tablet 20 mg  20 mg Oral Q4H PRN Ward, Kristen N, DO      . pantoprazole (PROTONIX) EC tablet 20 mg  20 mg Oral Daily Ward, Kristen N, DO   20 mg at 02/03/21 0949  . prochlorperazine (COMPAZINE) tablet  10 mg  10 mg Oral Q6H PRN Ward, Delice Bison, DO       Current Outpatient Medications  Medication Sig Dispense Refill  . amoxicillin-clavulanate (AUGMENTIN) 875-125 MG tablet Take 1 tablet by mouth 2 (two) times daily. 14 tablet 0  . COVID-19 mRNA vaccine, Pfizer, 30 MCG/0.3ML injection AS DIRECTED .3 mL 0  . dexamethasone (DECADRON) 4 MG tablet Take 2 tablets (8 mg total) by mouth daily. Take daily x 3 days starting the day after cisplatin chemotherapy. Take with food. (Patient taking differently: Take 8 mg by mouth See admin instructions. Take 8 mg daily x 3 days starting the day after cisplatin chemotherapy. Take with food.) 30 tablet 1  . escitalopram (LEXAPRO) 10 MG tablet Take 1 tablet (10 mg total) by mouth daily. Take half pill per day for 5 days then increase to one pill per day 30 tablet 1  . fentaNYL (DURAGESIC) 12 MCG/HR Place 1 patch onto the skin every 3 (three) days. This is additonal to the 25 mcg to make 37 mcg total 10 patch 0  . fentaNYL (DURAGESIC) 25 MCG/HR Place 1 patch onto the skin every 3 (three) days. This is additonal to the12 mcg to make 37 mcg total 10 patch 0  . fluconazole (DIFLUCAN) 100 MG tablet Take 1 tablet (100 mg total) by mouth daily. 7 tablet 0  . gabapentin (NEURONTIN) 300 MG capsule TAKE 1 CAPSULE (300 MG) BY MOUTH 3 TIMESDAILY 90 capsule 2  . lidocaine-prilocaine (EMLA) cream Apply to affected area once (Patient taking differently: Apply 1 application topically daily as needed (port access).) 30 g 3  . loperamide (IMODIUM) 2 MG capsule Take 1 capsule (2 mg total) by mouth as needed for diarrhea or loose stools (take 2 tab with first loose stool, then 1 tab. after every loose stool after that and maximum 8 a day).  48 capsule 0  . LORazepam (ATIVAN) 0.5 MG tablet Take 1 tablet (0.5 mg total) by mouth every 8 (eight) hours. 60 tablet 0  . Nutritional Supplements (FEEDING SUPPLEMENT, OSMOLITE 1.5 CAL,) LIQD Give 1 1/2 cartons of formula 4 times per day via feeding tube (8am, noon, 4pm and 8pm). Flush with 63ml of water before and after each feeding. Drink or give additional 271ml water TID between feedings via tube for better hydration. 1422 mL 0  . nystatin (MYCOSTATIN) 100000 UNIT/ML suspension Take 5 mLs (500,000 Units total) by mouth 4 (four) times daily. 473 mL 1  . ondansetron (ZOFRAN) 8 MG tablet Take 1 tablet (8 mg total) by mouth 2 (two) times daily as needed. Start on the third day after cisplatin chemotherapy. 30 tablet 1  . Oxycodone HCl 20 MG TABS Take 1 tablet (20 mg total) by mouth every 4 (four) hours as needed. 180 tablet 0  . pantoprazole (PROTONIX) 20 MG tablet TAKE 1 TABLET BY MOUTH DAILY. 30 tablet 1  . prochlorperazine (COMPAZINE) 10 MG tablet Take 1 tablet (10 mg total) by mouth every 6 (six) hours as needed (Nausea or vomiting). 30 tablet 1  . sertraline (ZOLOFT) 50 MG tablet Take 1 tablet (50 mg total) by mouth daily. (Patient not taking: No sig reported) 30 tablet 2    Musculoskeletal: Strength & Muscle Tone: within normal limits Gait & Station: normal Patient leans: N/A            Psychiatric Specialty Exam:  Presentation  General Appearance: No data recorded Eye Contact:No data recorded Speech:No data recorded Speech Volume:No data recorded Handedness:No data  recorded  Mood and Affect  Mood:No data recorded Affect:No data recorded  Thought Process  Thought Processes:No data recorded Descriptions of Associations:No data recorded Orientation:No data recorded Thought Content:No data recorded History of Schizophrenia/Schizoaffective disorder:No  Duration of Psychotic Symptoms:No data recorded Hallucinations:No data recorded Ideas of Reference:No data  recorded Suicidal Thoughts:No data recorded Homicidal Thoughts:No data recorded  Sensorium  Memory:No data recorded Judgment:No data recorded Insight:No data recorded  Executive Functions  Concentration:No data recorded Attention Span:No data recorded Recall:No data recorded [employer] [place] recorded Language:No data recorded  Psychomotor Activity  Psychomotor Activity:No data recorded  Assets  Assets:No data recorded  Sleep  Sleep:No data recorded  Physical Exam: Physical Exam Vitals and nursing note reviewed.  Constitutional:      Appearance: Normal appearance.  HENT:     Head: Normocephalic and atraumatic.     Mouth/Throat:     Pharynx: Oropharynx is clear.  Eyes:     Pupils: Pupils are equal, round, and reactive to light.  Cardiovascular:     Rate and Rhythm: Normal rate and regular rhythm.  Pulmonary:     Effort: Pulmonary effort is normal.     Breath sounds: Normal breath sounds.  Abdominal:     General: Abdomen is flat.     Palpations: Abdomen is soft.  Musculoskeletal:        General: Normal range of motion.  Skin:    General: Skin is warm and dry.  Neurological:     General: No focal deficit present.     Mental Status: He is alert. Mental status is at baseline.  Psychiatric:        Attention and Perception: Attention normal.        Mood and Affect: Mood is depressed. Affect is blunt.        Speech: Speech is delayed.        Thought Content: Thought content includes suicidal ideation.        Cognition and Memory: Cognition normal.        Judgment: Judgment normal.    Review of Systems  Constitutional: Negative.   HENT: Negative.   Eyes: Negative.   Respiratory: Negative.   Cardiovascular: Negative.   Gastrointestinal: Negative.   Musculoskeletal: Negative.   Skin: Negative.   Neurological: Negative.   Psychiatric/Behavioral: Positive for depression and suicidal ideas.   Blood pressure 127/85, pulse 73, temperature 98.2 F (36.8  C), temperature source Oral, resp. rate 16, height 5\' 9"  (1.753 m), weight 63.5 kg, SpO2 99 %. Body mass index is 20.67 kg/m.  Treatment Plan Summary: Plan Patient is recommended for admission to the psychiatric unit.  At this point with the tubes removed he no longer will require a one-to-one or any special treatment.  He is fully ambulatory and does not need any special nursing care.  Orders will be completed for admission downstairs.  Case reviewed with TTS nursing and emergency room physician.  Disposition: Recommend psychiatric Inpatient admission when medically cleared.  Alethia Berthold, MD 02/04/2021 6:06 PM

## 2021-02-04 NOTE — ED Provider Notes (Signed)
Emergency Medicine Observation Re-evaluation Note  Paul [facility] is a 50 y.o. male, seen on rounds today.  Pt initially presented to the ED for complaints of Psychiatric Evaluation and Addiction Problem  Currently, the patient is calm, no acute complaints.  Physical Exam  Blood pressure 112/86, pulse 85, temperature 98.1 F (36.7 C), temperature source Oral, resp. rate 16, height 5\' 9"  (1.753 m), weight 63.5 kg, SpO2 98 %. Physical Exam General: NAD Lungs: CTAB Psych: not agitated  ED Course / MDM  EKG:    I have reviewed the labs performed to date as well as medications administered while in observation.  Recent changes in the last 24 hours include no acute events overnight.    Plan  Current plan is for port a cath and PEG tube removal by surgery, inpatient psych admission. Patient is not under full IVC at this time.   Carrie Mew, MD 02/04/21 [phone]

## 2021-02-04 NOTE — ED Notes (Signed)
VOL/Pending Placement 

## 2021-02-04 NOTE — ED Notes (Signed)
Felicia, EDT attempting to obtain urine sample from pt at this time.

## 2021-02-04 NOTE — ED Notes (Signed)
Lissa, RN from same day surgery called and stated that pt needs repeat UDS since he was positive for cocaine on 4/15. Per Lissa, order has been placed. Lissa informed pt has been NPO since midnight and we will get UDS on pt when he wakes up.  Per Lissa, pt is schedule for surgery around noon. They will call back after UDS results.

## 2021-02-04 NOTE — Anesthesia Postprocedure Evaluation (Signed)
Anesthesia Post Note  Patient: DMITRI PETTIGREW  Procedure(s) Performed: REMOVAL PORT-A-CATH (N/A ) REMOVAL OF GASTROSTOMY TUBE (N/A )  Patient location during evaluation: PACU Anesthesia Type: General Level of consciousness: awake and alert and oriented Pain management: pain level controlled Vital Signs Assessment: post-procedure vital signs reviewed and stable Respiratory status: spontaneous breathing, nonlabored ventilation and respiratory function stable Cardiovascular status: blood pressure returned to baseline and stable Postop Assessment: no signs of nausea or vomiting Anesthetic complications: no   No complications documented.   Last Vitals:  Vitals:   02/04/21 1430 02/04/21 1453  BP: 120/74 127/85  Pulse:  73  Resp: 15 16  Temp:  36.8 C  SpO2: 100% 99%    Last Pain:  Vitals:   02/04/21 1453  TempSrc: Oral  PainSc:                  Avonelle Viveros

## 2021-02-04 NOTE — ED Notes (Signed)
Brought back to ED by PACU. Ambulatory to bathroom.

## 2021-02-04 NOTE — Op Note (Signed)
  Procedure Date:  02/04/2021  Pre-operative Diagnosis:  Port-a-cath in place, gastrostomy tube in place  Post-operative Diagnosis: Port-a-cath in place, gastrostomy tube in place  Procedure:  Removal of right IJ port-a-cath, removal of gastrostomy tube  Surgeon:  Melvyn Neth, MD  Assistant:  Signa Kell, PA-S  Anesthesia:  MAC  Estimated Blood Loss:  5 ml  Specimens:  Port-a-cath, gastrostomy tube  Complications:  None  Indications for Procedure:  This is a 50 y.o. male with a port-a-cath and gastrostomy tube in place, requiring removal of both for psychiatric admission.  The risks of bleeding, infection, injury to surrounding structures, and need for further procedures were discussed with the patient and he was willing to proceed.  Description of Procedure: The patient was correctly identified in the preoperative area and brought into the operating room.  The patient was placed supine with VTE prophylaxis in place.  Appropriate time-outs were performed.  Anesthesia was induced and the patient was sedated.  Appropriate antibiotics were infused.  The right upper chest and neck as well as the abdomen were prepped and draped in usual sterile fashion. Local anesthetic was infiltrated into the skin overlying the port.  An incision was made and cautery was used to dissect down to the capsule.  The capsule was entered and the port was dissected and exteriorized.  The capsule was then resected using cautery.  The catheter was then pulled out while placing pressure in the IJ insertion site for 7 minutes.  There was no hematoma afterwards.  The cavity was then irrigated and more local anesthetic was infiltrated.  The wound was closed in in layers with interrupted 3-0 Vicryl followed by 4-0 subcuticular Monocryl sutures and sealed with DermaBond.  We then proceeded to the gastrostomy tube.  The balloon was deflated, having 8 ml in the balloon.  The tube was then removed intact.  The tunnel  was scraped to allow for scarring formation.  Local anesthetic was infiltrated.  The area was cleaned and dressed with neosporin and dry 4x4 gauze and tape.  The patient was emerged from anesthesia and brought to the recovery room for further management.  The patient tolerated the procedure well and all counts were correct at the end of the case.   Melvyn Neth, MD

## 2021-02-04 NOTE — ED Notes (Signed)
No vitals at this time ,patient sleeping

## 2021-02-04 NOTE — Anesthesia Preprocedure Evaluation (Signed)
Anesthesia Evaluation  Patient identified by MRN, date of birth, ID band Patient awake    Reviewed: Allergy & Precautions, NPO status , Patient's Chart, lab work & pertinent test results  History of Anesthesia Complications Negative for: history of anesthetic complications  Airway Mallampati: II  TM Distance: >3 FB Neck ROM: Full    Dental  (+) Partial Upper, Poor Dentition, Missing   Pulmonary neg sleep apnea, neg COPD, Current Smoker and Patient abstained from smoking.,    breath sounds clear to auscultation- rhonchi (-) wheezing      Cardiovascular Exercise Tolerance: Good (-) hypertension(-) CAD, (-) Past MI, (-) Cardiac Stents and (-) CABG  Rhythm:Regular Rate:Normal - Systolic murmurs and - Diastolic murmurs    Neuro/Psych neg Seizures PSYCHIATRIC DISORDERS Depression negative neurological ROS     GI/Hepatic negative GI ROS, Neg liver ROS,   Endo/Other  negative endocrine ROSneg diabetes  Renal/GU negative Renal ROS     Musculoskeletal negative musculoskeletal ROS (+)   Abdominal (+) - obese,   Peds  Hematology negative hematology ROS (+)   Anesthesia Other Findings 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[place])   Reproductive/Obstetrics                             Anesthesia Physical Anesthesia Plan  ASA: III  Anesthesia Plan: General   Post-op Pain Management:    Induction: Intravenous  PONV Risk Score and Plan: 0 and Propofol infusion  Airway Management Planned: Natural Airway  Additional Equipment:   Intra-op Plan:   Post-operative Plan:   Informed Consent: I have reviewed the patients History and Physical, chart, labs and discussed the procedure including the risks, benefits and alternatives for the proposed anesthesia with the patient or authorized representative who has indicated his/her understanding and acceptance.     Dental advisory given  Plan  Discussed with: CRNA and Anesthesiologist  Anesthesia Plan Comments:         Anesthesia Quick Evaluation

## 2021-02-04 NOTE — ED Notes (Signed)
Order for Neosporin to be placed on gauze and placed on abdominal wound placed by Dr. Weber Cooks. RN spoke with Dr. Weber Cooks to verify if dressing on abdomen needed to be changed since it was just placed this afternoon, per Dr. Weber Cooks can wait to start dressing changes until tomorrow.

## 2021-02-04 NOTE — ED Notes (Signed)
Pt informed this RN checked for mouth swabs but we did not have any in the supply room. Pt informed that he should be going up to pre op around 1115

## 2021-02-04 NOTE — ED Notes (Signed)
Pt. Alert and oriented, warm and dry, in no distress. Pt. Denies SI, HI, and AVH. Pt. Encouraged to let nursing staff know of any concerns or needs.   ENVIRONMENTAL ASSESSMENT Potentially harmful objects out of patient reach: Yes.   Personal belongings secured: Yes.   Patient dressed in hospital provided attire only: Yes.   Plastic bags out of patient reach: Yes.   Patient care equipment (cords, cables, call bells, lines, and drains) shortened, removed, or accounted for: Yes.   Equipment and supplies removed from bottom of stretcher: Yes.   Potentially toxic materials out of patient reach: Yes.   Sharps container removed or out of patient reach: Yes.     Sandwich and soft drink given.

## 2021-02-04 NOTE — ED Notes (Signed)
Pt given meal tray and grape juice. Pt informed that MD is still rounding and will be around to speak with him shortly.

## 2021-02-04 NOTE — Transfer of Care (Signed)
Immediate Anesthesia Transfer of Care Note  Patient: HERU MONTZ  Procedure(s) Performed: REMOVAL PORT-A-CATH (N/A ) REMOVAL OF GASTROSTOMY TUBE (N/A )  Patient Location: PACU  Anesthesia Type:MAC  Level of Consciousness: awake, alert  and oriented  Airway & Oxygen Therapy: Patient Spontanous Breathing and Patient connected to face mask oxygen  Post-op Assessment: Report given to RN and Post -op Vital signs reviewed and stable  Post vital signs: stable  Last Vitals:  Vitals Value Taken Time  BP 114/88 02/04/21 1356  Temp    Pulse 68 02/04/21 1400  Resp 12 02/04/21 1400  SpO2 98 % 02/04/21 1400  Vitals shown include unvalidated device data.  Last Pain:  Vitals:   02/04/21 1134  TempSrc: Oral  PainSc: 4          Complications: No complications documented.

## 2021-02-04 NOTE — Progress Notes (Signed)
[facility] Hospital Day(s): 0.   Interval History:  Patient seen and examined No acute events or new complaints overnight.  Patient reports he is anxious to have procedure done as he is very thirsty Plan for port and gastrostomy tube removal today in OR   Vital signs in last 24 hours: [min-max] current  Temp:  [98.2 F (36.8 C)] 98.2 F (36.8 C) (04/21 1915) Pulse Rate:  [82-97] 97 (04/21 1915) Resp:  [17] 17 (04/21 1915) BP: (123-139)/(93-94) 139/94 (04/21 1915) SpO2:  [98 %-99 %] 98 % (04/21 1915)     Height: 5\' 9"  (175.3 cm) Weight: 63.5 kg BMI (Calculated): 20.67   Intake/Output last 2 shifts:  04/21 0701 - 04/22 0700 In: 600 [P.O.:600] Out: -    Physical Exam:  Constitutional: alert, cooperative and no distress  Respiratory: breathing non-labored at rest  Cardiovascular: regular rate and sinus rhythm  Gastrointestinal: Soft, non-tender, and non-distended, no rebound/guarding. Gastrostomy tube in the LUQ Integumentary: Port in the right chest wall  Labs:  CBC Latest Ref Rng & Units 01/28/2021 12/27/2020 12/20/2020  WBC 4.0 - 10.5 K/uL 6.9 10.2 11.1(H)  Hemoglobin 13.0 - 17.0 g/dL 14.3 11.4(L) 12.7(L)  Hematocrit 39.0 - 52.0 % 39.4 31.1(L) 34.5(L)  Platelets 150 - 400 K/uL 314 177 319   CMP Latest Ref Rng & Units 01/28/2021 12/27/2020 12/20/2020  Glucose 70 - 99 mg/dL 103(H) 127(H) 99  BUN 6 - 20 mg/dL 9 27(H) 20  Creatinine 0.61 - 1.24 mg/dL 0.75 0.77 0.71  Sodium 135 - 145 mmol/L 137 130(L) 129(L)  Potassium 3.5 - 5.1 mmol/L 4.6 4.2 3.9  Chloride 98 - 111 mmol/L 98 94(L) 93(L)  CO2 22 - 32 mmol/L 30 29 26   Calcium 8.9 - 10.3 mg/dL 9.3 8.9 8.8(L)  Total Protein 6.5 - 8.1 g/dL 7.6 - -  Total Bilirubin 0.3 - 1.2 mg/dL 0.8 - -  Alkaline Phos 38 - 126 U/L 64 - -  AST 15 - 41 U/L 27 - -  ALT 0 - 44 U/L 19 - -     Imaging studies: No new pertinent imaging studies   Assessment/Plan: (ICD-10's: F32.2) 50 y.o. male with history  of oropharyngeal cancer with port-a-catheter/gastrostomy tube admitted with depression pending psychiatric placement   - Will plan for port-a-catheter and gastrostomy tube removal in the OR today with Dr Hampton Abbot.   - All risks, benefits, and alternatives to above procedure(s) were discussed with the patient, all of his questions were answered to his expressed satisfaction, patient expresses he wishes to proceed, and informed consent was obtained.             - NPO at midnight             - Further management per primary service   All of the above findings and recommendations were discussed with the patient, and the medical team, and all of patient's questions were answered to his expressed satisfaction.  -- Edison Simon, PA-C [facility] 02/04/2021, 7:50 AM [phone] M-F: 7am - 4pm

## 2021-02-04 NOTE — ED Notes (Signed)
Surgical team at bedside seeing pt

## 2021-02-05 ENCOUNTER — Encounter: Payer: Self-pay | Admitting: Behavioral Health

## 2021-02-05 DIAGNOSIS — F332 Major depressive disorder, recurrent severe without psychotic features: Secondary | ICD-10-CM | POA: Diagnosis present

## 2021-02-05 MED ORDER — NICOTINE 14 MG/24HR TD PT24
14.0000 mg | MEDICATED_PATCH | Freq: Every day | TRANSDERMAL | Status: DC
  Administered 2021-02-05 – 2021-02-11 (×7): 14 mg via TRANSDERMAL
  Filled 2021-02-05 (×11): qty 1

## 2021-02-05 MED ORDER — HYDROXYZINE HCL 50 MG PO TABS
50.0000 mg | ORAL_TABLET | Freq: Three times a day (TID) | ORAL | Status: DC | PRN
  Administered 2021-02-05 – 2021-02-06 (×3): 50 mg via ORAL
  Filled 2021-02-05 (×3): qty 1

## 2021-02-05 MED ORDER — PANTOPRAZOLE SODIUM 20 MG PO TBEC
20.0000 mg | DELAYED_RELEASE_TABLET | Freq: Every day | ORAL | Status: DC
  Administered 2021-02-05 – 2021-02-17 (×13): 20 mg via ORAL
  Filled 2021-02-05 (×13): qty 1

## 2021-02-05 MED ORDER — BACITRACIN-NEOMYCIN-POLYMYXIN OINTMENT TUBE
TOPICAL_OINTMENT | Freq: Every day | CUTANEOUS | Status: DC
  Administered 2021-02-07 – 2021-02-17 (×6): 1 via TOPICAL
  Filled 2021-02-05: qty 14.17

## 2021-02-05 MED ORDER — TRAZODONE HCL 100 MG PO TABS
100.0000 mg | ORAL_TABLET | Freq: Every evening | ORAL | Status: DC | PRN
  Administered 2021-02-05: 100 mg via ORAL
  Filled 2021-02-05: qty 1

## 2021-02-05 MED ORDER — ACETAMINOPHEN 325 MG PO TABS
650.0000 mg | ORAL_TABLET | Freq: Four times a day (QID) | ORAL | Status: DC | PRN
  Administered 2021-02-09: 650 mg via ORAL
  Filled 2021-02-05 (×2): qty 2

## 2021-02-05 MED ORDER — MAGNESIUM HYDROXIDE 400 MG/5ML PO SUSP: 30.0000 mL | Freq: Every day | ORAL | Status: DC | PRN

## 2021-02-05 MED ORDER — GABAPENTIN 300 MG PO CAPS
300.0000 mg | ORAL_CAPSULE | Freq: Three times a day (TID) | ORAL | Status: DC
  Administered 2021-02-05 – 2021-02-10 (×16): 300 mg via ORAL
  Filled 2021-02-05 (×18): qty 1

## 2021-02-05 MED ORDER — OXYCODONE-ACETAMINOPHEN 5-325 MG PO TABS
1.0000 | ORAL_TABLET | Freq: Four times a day (QID) | ORAL | Status: DC | PRN
  Administered 2021-02-05: 1 via ORAL
  Filled 2021-02-05 (×4): qty 1

## 2021-02-05 MED ORDER — ESCITALOPRAM OXALATE 10 MG PO TABS
10.0000 mg | ORAL_TABLET | Freq: Every day | ORAL | Status: DC
Start: 2021-02-05 — End: 2021-02-17
  Administered 2021-02-05 – 2021-02-17 (×13): 10 mg via ORAL
  Filled 2021-02-05 (×13): qty 1

## 2021-02-05 MED ORDER — ALUM & MAG HYDROXIDE-SIMETH 200-200-20 MG/5ML PO SUSP: 30.0000 mL | ORAL | Status: DC | PRN

## 2021-02-05 NOTE — [facility] Group Notes (Signed)
LCSW Group Therapy Note  02/05/2021 2:13 PM  Type of Therapy and Topic:  Group Therapy: Avoiding Self-Sabotaging and Enabling Behaviors  Participation Level:  Active   Description of Group:   In this group, patients will learn how to identify obstacles, self-sabotaging and enabling behaviors, as well as: what are they, why do we do them and what needs these behaviors meet. Discuss unhealthy relationships and how to have positive healthy boundaries with those that sabotage and enable. Explore aspects of self-sabotage and enabling in yourself and how to limit these self-destructive behaviors in everyday life.   Therapeutic Goals: 1. Patient will identify one obstacle that relates to self-sabotage and enabling behaviors 2. Patient will identify one personal self-sabotaging or enabling behavior they did prior to admission 3. Patient will state a plan to change the above identified behavior 4. Patient will demonstrate ability to communicate their needs through discussion and/or role play.   Summary of Patient Progress: Patient was present for the entirety of the group session. Patient was an active listener and participated in the topic of discussion, provided helpful advice to others, and added nuance to topic of conversation.     Therapeutic Modalities:   Cognitive Behavioral Therapy Person-Centered Therapy Motivational Interviewing   Paulla Dolly, MSW, [place], [place] 02/05/2021 2:13 PM

## 2021-02-05 NOTE — Progress Notes (Addendum)
Paul [facility]bert is very pleasant and calm.  He was talkative to said nurse and telling me about his cancer diagnosis.  He denies SI/HI. He is alert and orientated x 4.  He arrived to eat breakfast and lunch.   Will continue to monitor patient every 15 minutes for safety checks and progress.

## 2021-02-05 NOTE — Plan of Care (Signed)
Patient newly admitted and requires stabilization of depressive symptoms. 

## 2021-02-05 NOTE — Progress Notes (Signed)
Patient admitted from ED after voicing suicidal thoughst due to health issues, fiancee left him recently, and being homeless. He is Ox4, sad and depressed on approach. He denies hallucinations  Cooperative with treatment thus far.

## 2021-02-05 NOTE — [facility] Suicide Risk Assessment (Signed)
[facility] Admission Suicide Risk Assessment   Nursing information obtained from:    Demographic factors:  Male,Caucasian,Low socioeconomic status,Unemployed Current Mental Status:  Suicidal ideation indicated by patient Loss Factors:  Decline in physical [facility],Financial problems / change in socioeconomic status Historical Factors:  NA Risk Reduction Factors:  Sense of responsibility to family  Total Time spent with patient: 1 hour Principal Problem: <principal problem not specified> Diagnosis:  Active Problems:   Severe recurrent major depression without psychotic features ([facility])  Subjective Data:   History of Present Illness: Paul Hebert is a 50 year old male who presents to the emergency department due to depression. He stayed in the [facility]gence [facility] for several days and was referred out but psych facilities were unable to take him due to his Port-A-Cath and PEG tube. These were removed yesterday general surgery. He was start seen by Dr. Delcie Roch in emergency department will start on Lexapro 5 mg which is increased to 10 mg. He was given PRN ativan and PRN oxycodone in addition to PRN Fentanyl patches for pain, and asks for these here.   Patient indicates no known history of no mental illness. States that he had been doing well until his fianc abruptly left him seven weeks ago and did not say why--says that he does not have any closure due to this. Notes that she took his tax refund money.   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 Reports a long-standing history of being addicted to alcohol and cocaine over the past 35 years, and had been sober while in prison for 12 your time span; he went to prison due to robbery to fund his addiction. States that he had been sober prior to her leaving but then restarted. He was acting erratically and was kicked out from his  friendss house and so decided to come to [name] seek help.  Says that he was told that he bipolar disorder because he has "always been hyper" all of his life. No symptoms of mania/hypomania.   Denies paranoia or psychosis in the absence of alcohol and drugs but reports that he would have such symptoms during continued usage. He expressed passive suicidal ideations in [name] and was admitted to [facility]. Reports he was started on gabapentin Lexapro and trazodone while in [name]. So that he had it never was a good sleeper but now is able to sleep better with trazodone. Reports ongoing severe depression, but denies s/i. No hx of suicide attempts. Reports that he is homeless.   Continued Clinical Symptoms:  Alcohol Use Disorder Identification Test Final Score (AUDIT): 18 The "Alcohol Use Disorders Identification Test", Guidelines for Use in Primary Care, Second Edition.  World Pharmacologist Paul [facility]bert). Score between 0-7:  no or low risk or alcohol related problems. Score between 8-15:  moderate risk of alcohol related problems. Score between 16-19:  high risk of alcohol related problems. Score 20 or above:  warrants further diagnostic evaluation for alcohol dependence and treatment.   CLINICAL FACTORS:   Depression:   Anhedonia   Physical Exam: Physical Exam Vitals and nursing note reviewed.  Constitutional:      Appearance: Normal appearance.  HENT:     Head: Normocephalic and atraumatic.     Mouth/Throat:     Pharynx: Oropharynx is clear.  Eyes:     Pupils: Pupils are equal, round, and reactive to light.  Cardiovascular:  Rate and Rhythm: Normal rate and regular rhythm.  Pulmonary:     Effort: Pulmonary effort is normal.     Breath sounds: Normal breath sounds.  Abdominal:     General: Abdomen is flat.     Palpations: Abdomen is soft.  Musculoskeletal:        General: Normal range of motion.  Skin:    General: Skin is warm and dry.  Neurological:      General: No focal deficit present.     Mental Status: He is alert. Mental status is at baseline.  Psychiatric:        Attention and Perception: Attention normal.        Mood and Affect: Mood is depressed. Affect is neutral.        Speech: Speech is delayed.        Thought Content: Thought content includes suicidal ideation.        Cognition and Memory: Cognition normal.        Judgment: Judgment normal.    Review of Systems  Constitutional: Negative.   HENT: Negative.   Eyes: Negative.   Respiratory: Negative.   Cardiovascular: Negative.   Gastrointestinal: Negative.   Musculoskeletal: Negative.   Skin: Negative.   Neurological: Negative.   Psychiatric/Behavioral: Positive for depression and suicidal ideas.   Blood pressure (!) 140/92, pulse 89, temperature 97.7 F (36.5 C), temperature source Oral, resp. rate 18, height 5\' 6"  (1.676 m), weight 60.8 kg, SpO2 100 %. Body mass index is 21.63 kg/m.   COGNITIVE FEATURES THAT CONTRIBUTE TO RISK:  None    SUICIDE RISK:   Moderate:  Frequent suicidal ideation with limited intensity, and duration, some specificity in terms of plans, no associated intent, good self-control, limited dysphoria/symptomatology, some risk factors present, and identifiable protective factors, including available and accessible social support.  PLAN OF CARE:  Adjust meds and provide structure and support.   I certify that inpatient services furnished can reasonably be expected to improve the patient's condition.   Rulon Sera, MD 02/05/2021, 11:34 AM

## 2021-02-05 NOTE — H&P (Addendum)
Psychiatric Admission Assessment Adult  Patient Identification: Paul Hebert MRN:  [id number] Date of Evaluation:  02/05/2021 Chief Complaint:  Severe recurrent major depression without psychotic features ([place]) [F33.2] Principal Diagnosis: <principal problem not specified> Diagnosis:  Active Problems:   Severe recurrent major depression without psychotic features ([place])  History of Present Illness: Paul Hebert is a 50 year old male who presents to the emergency department due to depression. He stayed in the [facility] for several days and was referred out but psych facilities were unable to take him due to his Port-A-Cath and PEG tube. These were removed yesterday general surgery. He was start seen by Dr. Delcie Roch in emergency department will start on Lexapro 5 mg which was increased to 10 mg. He was given PRN ativan and PRN oxycodone in addition to PRN Fentanyl patches for pain, and asks for these here.   Patient indicates no known history of no mental illness. States that he had been doing well until his fianc abruptly left him seven weeks ago and did not say why--says that he does not have any closure due to this. Notes that she took his tax refund money.   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 Reports a long-standing history of being addicted to alcohol and cocaine over the past 35 years, and had been sober while in prison for 12 your time span; he went to prison due to robbery to fund his addiction. States that he had been sober prior to her leaving but then restarted. He was acting erratically and was kicked out from his friendss house and so decided to come to the hospital seek help.  Says that he was told that he bipolar disorder because he has "always been hyper" all of his life. No symptoms of mania/hypomania.   Denies paranoia or psychosis in the  absence of alcohol and drugs but reports that he would have such symptoms during continued usage. He expressed passive suicidal ideations in the hospital and was admitted to [facility]. Reports he was started on gabapentin Lexapro and trazodone while in the hospital. So that he had it never was a good sleeper but now is able to sleep better with trazodone. Reports ongoing severe depression, but denies s/i. No hx of suicide attempts. Reports that he is homeless.   Alcohol Screening: 1. How often do you have a drink containing alcohol?: 4 or more times a week 2. How many drinks containing alcohol do you have on a typical day when you are drinking?: 10 or more 3. How often do you have six or more drinks on one occasion?: Daily or almost daily AUDIT-C Score: 12 4. How often during the last year have you found that you were not able to stop drinking once you had started?: Monthly 5. How often during the last year have you failed to do what was normally expected from you because of drinking?: Never 6. How often during the last year have you needed a first drink in the morning to get yourself going after a heavy drinking session?: Never 7. How often during the last year have you had a feeling of guilt of remorse after drinking?: Monthly 8. How often during the last year have you been unable to remember what happened the night before because you had been drinking?: Monthly 9. Have you or someone else been injured as a result of your drinking?: No  10. Has a relative or friend or a doctor or another health worker been concerned about your drinking or suggested you cut down?: No Alcohol Use Disorder Identification Test Final Score (AUDIT): 18 Alcohol Brief Interventions/Follow-up: Alcohol education/Brief advice Substance Abuse History in the last 12 months:  No. Consequences of Substance Abuse:   Psych hx: was on zoloft recently which worsened his mood.  Past Medical History:  Past Medical History:  Diagnosis  Date  . 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 PORT-A-CATH REMOVAL N/A 02/04/2021   Procedure: REMOVAL PORT-A-CATH;  Surgeon: Olean Ree, MD;  Location: [facility] ORS;  Service: General;  Laterality: N/A;  . PORTA CATH INSERTION N/A 10/25/2020   Procedure: PORTA CATH INSERTION;  Surgeon: Algernon Huxley, MD;  Location: [facility] LAB;  Service: Cardiovascular;  Laterality: N/A;  . REMOVAL OF GASTROSTOMY TUBE N/A 02/04/2021   Procedure: REMOVAL OF GASTROSTOMY TUBE;  Surgeon: Olean Ree, MD;  Location: [facility] ORS;  Service: General;  Laterality: N/A;   Family History:  Family History  Problem Relation Age of Onset  . Cancer Father    Family Psychiatric  History: None Tobacco Screening: Have you used any form of tobacco in the last 30 days? (Cigarettes, Smokeless Tobacco, Cigars, and/or Pipes): Yes Tobacco use, Select all that apply: 5 or more cigarettes per day Are you interested in Tobacco Cessation Medications?: Yes, will notify MD for an order Counseled patient on smoking cessation including recognizing danger situations, developing coping skills and basic information about quitting provided: Refused/Declined practical counseling Social History:  Social History   Substance and Sexual Activity  Alcohol Use Yes   Comment: 12 beers a day     Social History   Substance and Sexual Activity  Drug Use Yes  . Types: Marijuana, "Crack" cocaine    Additional Social History:                           Allergies:  No Known Allergies Lab Results:  Results for orders placed or performed during the hospital encounter of 01/28/21 (from the past 48 hour(s))  Urine Drug Screen, Qualitative ([place] only)     Status: None   Collection Time: 02/04/21  8:02 AM  Result Value Ref Range   Tricyclic, Ur Screen NONE DETECTED NONE DETECTED   Amphetamines, Ur Screen NONE DETECTED NONE DETECTED   MDMA (Ecstasy)Ur Screen NONE DETECTED NONE DETECTED    Cocaine Metabolite,Ur Blanket NONE DETECTED NONE DETECTED   Opiate, Ur Screen NONE DETECTED NONE DETECTED   Phencyclidine (PCP) Ur S NONE DETECTED NONE DETECTED   Cannabinoid 50 Ng, Ur [place] NONE DETECTED NONE DETECTED   Barbiturates, Ur Screen NONE DETECTED NONE DETECTED   Benzodiazepine, Ur Scrn NONE DETECTED NONE DETECTED   Methadone Scn, Ur NONE DETECTED NONE DETECTED    Comment: (NOTE) Tricyclics + metabolites, urine    Cutoff 1000 ng/mL Amphetamines + metabolites, urine  Cutoff 1000 ng/mL MDMA (Ecstasy), urine              Cutoff 500 ng/mL Cocaine Metabolite, urine          Cutoff 300 ng/mL Opiate + metabolites, urine        Cutoff 300 ng/mL Phencyclidine (PCP), urine         Cutoff 25 ng/mL Cannabinoid, urine                 Cutoff 50 ng/mL  Barbiturates + metabolites, urine  Cutoff 200 ng/mL Benzodiazepine, urine              Cutoff 200 ng/mL Methadone, urine                   Cutoff 300 ng/mL  The urine drug screen provides only a preliminary, unconfirmed analytical test result and should not be used for non-medical purposes. Clinical consideration and professional judgment should be applied to any positive drug screen result due to possible interfering substances. A more specific alternate chemical method must be used in order to obtain a confirmed analytical result. Gas chromatography / mass spectrometry (GC/MS) is the preferred confirm atory method. Performed at [facility], [address] [place], [place] [address]   Resp Panel by RT-PCR (Flu A&B, Covid) Nasopharyngeal Swab     Status: None   Collection Time: 02/04/21  6:29 PM   Specimen: Nasopharyngeal Swab; Nasopharyngeal(NP) swabs in vial transport medium  Result Value Ref Range   SARS Coronavirus 2 by RT PCR NEGATIVE NEGATIVE    Comment: (NOTE) SARS-CoV-2 target nucleic acids are NOT DETECTED.  The SARS-CoV-2 RNA is generally detectable in upper respiratory specimens during the acute phase of infection. The  lowest concentration of SARS-CoV-2 viral copies this assay can detect is 138 copies/mL. A negative result does not preclude SARS-Cov-2 infection and should not be used as the sole basis for treatment or other patient management decisions. A negative result may occur with  improper specimen collection/handling, submission of specimen other than nasopharyngeal swab, presence of viral mutation(s) within the areas targeted by this assay, and inadequate number of viral copies(<138 copies/mL). A negative result must be combined with clinical observations, patient history, and epidemiological information. The expected result is Negative.  Fact Sheet for Patients:  EntrepreneurPulse.com.au  Fact Sheet for Healthcare Providers:  IncredibleEmployment.be  This test is no t yet approved or cleared by the [place] FDA and  has been authorized for detection and/or diagnosis of SARS-CoV-2 by FDA under an Emergency Use Authorization (EUA). This EUA will remain  in effect (meaning this test can be used) for the duration of the COVID-19 declaration under Section 564(b)(1) of the Act, 21 U.S.C.section 360bbb-3(b)(1), unless the authorization is terminated  or revoked sooner.       Influenza A by PCR NEGATIVE NEGATIVE   Influenza B by PCR NEGATIVE NEGATIVE    Comment: (NOTE) The Xpert Xpress SARS-CoV-2/FLU/RSV plus assay is intended as an aid in the diagnosis of influenza from Nasopharyngeal swab specimens and should not be used as a sole basis for treatment. Nasal washings and aspirates are unacceptable for Xpert Xpress SARS-CoV-2/FLU/RSV testing.  Fact Sheet for Patients: EntrepreneurPulse.com.au  Fact Sheet for Healthcare Providers: IncredibleEmployment.be  This test is not yet approved or cleared by the [place] FDA and has been authorized for detection and/or diagnosis of SARS-CoV-2 by FDA under an Emergency  Use Authorization (EUA). This EUA will remain in effect (meaning this test can be used) for the duration of the COVID-19 declaration under Section 564(b)(1) of the Act, 21 U.S.C. section 360bbb-3(b)(1), unless the authorization is terminated or revoked.  Performed at [facility], [address] [place], [place] [address]     Blood Alcohol level:  Lab Results  Component Value Date   ETH <10 01/28/2021   ETH 222 (H) [id number]    Metabolic Disorder Labs:  No results found for: HGBA1C, MPG No results found for: PROLACTIN No results found for: CHOL,  TRIG, HDL, CHOLHDL, VLDL, LDLCALC  Current Medications: Current Facility-Administered Medications  Medication Dose Route Frequency Provider Last Rate Last Admin  . acetaminophen (TYLENOL) tablet 650 mg  650 mg Oral Q6H PRN Clapacs, John T, MD      . alum & mag hydroxide-simeth (MAALOX/MYLANTA) 200-200-20 MG/5ML suspension 30 mL  30 mL Oral Q4H PRN Clapacs, John T, MD      . escitalopram (LEXAPRO) tablet 10 mg  10 mg Oral Daily Clapacs, Madie Reno, MD   10 mg at 02/05/21 0851  . gabapentin (NEURONTIN) capsule 300 mg  300 mg Oral TID Clapacs, Madie Reno, MD   300 mg at 02/05/21 0851  . hydrOXYzine (ATARAX/VISTARIL) tablet 50 mg  50 mg Oral TID PRN Clapacs, Madie Reno, MD   50 mg at 02/05/21 0093  . magnesium hydroxide (MILK OF MAGNESIA) suspension 30 mL  30 mL Oral Daily PRN Clapacs, Madie Reno, MD      . neomycin-bacitracin-polymyxin (NEOSPORIN) ointment   Topical Daily Clapacs, Madie Reno, MD   Given at 02/05/21 [phone]  . nicotine (NICODERM CQ - dosed in mg/24 hours) patch 14 mg  14 mg Transdermal Daily Clapacs, Madie Reno, MD   14 mg at 02/05/21 0851  . oxyCODONE-acetaminophen (PERCOCET/ROXICET) 5-325 MG per tablet 1 tablet  1 tablet Oral Q6H PRN Rulon Sera, MD      . pantoprazole (PROTONIX) EC tablet 20 mg  20 mg Oral Daily Clapacs, Madie Reno, MD   20 mg at 02/05/21 0851  . traZODone (DESYREL) tablet 100 mg  100 mg Oral QHS PRN Clapacs, Madie Reno, MD        PTA Medications: Medications Prior to Admission  Medication Sig Dispense Refill Last Dose  . amoxicillin-clavulanate (AUGMENTIN) 875-125 MG tablet Take 1 tablet by mouth 2 (two) times daily. 14 tablet 0   . COVID-19 mRNA vaccine, Pfizer, 30 MCG/0.3ML injection AS DIRECTED .3 mL 0   . dexamethasone (DECADRON) 4 MG tablet Take 2 tablets (8 mg total) by mouth daily. Take daily x 3 days starting the day after cisplatin chemotherapy. Take with food. (Patient taking differently: Take 8 mg by mouth See admin instructions. Take 8 mg daily x 3 days starting the day after cisplatin chemotherapy. Take with food.) 30 tablet 1   . escitalopram (LEXAPRO) 10 MG tablet Take 1 tablet (10 mg total) by mouth daily. Take half pill per day for 5 days then increase to one pill per day 30 tablet 1   . fentaNYL (DURAGESIC) 12 MCG/HR Place 1 patch onto the skin every 3 (three) days. This is additonal to the 25 mcg to make 37 mcg total 10 patch 0   . fentaNYL (DURAGESIC) 25 MCG/HR Place 1 patch onto the skin every 3 (three) days. This is additonal to the12 mcg to make 37 mcg total 10 patch 0   . fluconazole (DIFLUCAN) 100 MG tablet Take 1 tablet (100 mg total) by mouth daily. 7 tablet 0   . gabapentin (NEURONTIN) 300 MG capsule TAKE 1 CAPSULE (300 MG) BY MOUTH 3 TIMESDAILY 90 capsule 2   . lidocaine-prilocaine (EMLA) cream Apply to affected area once (Patient taking differently: Apply 1 application topically daily as needed (port access).) 30 g 3   . loperamide (IMODIUM) 2 MG capsule Take 1 capsule (2 mg total) by mouth as needed for diarrhea or loose stools (take 2 tab with first loose stool, then 1 tab. after every loose stool after that and maximum 8 a day). 48 capsule 0   .  LORazepam (ATIVAN) 0.5 MG tablet Take 1 tablet (0.5 mg total) by mouth every 8 (eight) hours. 60 tablet 0   . Nutritional Supplements (FEEDING SUPPLEMENT, OSMOLITE 1.5 CAL,) LIQD Give 1 1/2 cartons of formula 4 times per day via feeding tube (8am,  noon, 4pm and 8pm). Flush with 74ml of water before and after each feeding. Drink or give additional 224ml water TID between feedings via tube for better hydration. 1422 mL 0   . nystatin (MYCOSTATIN) 100000 UNIT/ML suspension Take 5 mLs (500,000 Units total) by mouth 4 (four) times daily. 473 mL 1   . ondansetron (ZOFRAN) 8 MG tablet Take 1 tablet (8 mg total) by mouth 2 (two) times daily as needed. Start on the third day after cisplatin chemotherapy. 30 tablet 1   . Oxycodone HCl 20 MG TABS Take 1 tablet (20 mg total) by mouth every 4 (four) hours as needed. 180 tablet 0   . pantoprazole (PROTONIX) 20 MG tablet TAKE 1 TABLET BY MOUTH DAILY. 30 tablet 1   . prochlorperazine (COMPAZINE) 10 MG tablet Take 1 tablet (10 mg total) by mouth every 6 (six) hours as needed (Nausea or vomiting). 30 tablet 1   . sertraline (ZOLOFT) 50 MG tablet Take 1 tablet (50 mg total) by mouth daily. (Patient not taking: No sig reported) 30 tablet 2     Musculoskeletal: Strength & Muscle Tone: within normal limits Gait & Station: normal Patient leans: Right   Physical Exam: Physical Exam Vitals and nursing note reviewed.  Constitutional:      Appearance: Normal appearance.  HENT:     Head: Normocephalic and atraumatic.     Mouth/Throat:     Pharynx: Oropharynx is clear.  Eyes:     Pupils: Pupils are equal, round, and reactive to light.  Cardiovascular:     Rate and Rhythm: Normal rate and regular rhythm.  Pulmonary:     Effort: Pulmonary effort is normal.     Breath sounds: Normal breath sounds.  Abdominal:     General: Abdomen is flat.     Palpations: Abdomen is soft.  Musculoskeletal:        General: Normal range of motion.  Skin:    General: Skin is warm and dry.  Neurological:     General: No focal deficit present.     Mental Status: He is alert. Mental status is at baseline.  Psychiatric:        Attention and Perception: Attention normal.        Mood and Affect: Mood is depressed. Affect is  blunt.        Speech: Speech is delayed.        Thought Content: Thought content includes suicidal ideation.        Cognition and Memory: Cognition normal.        Judgment: Judgment normal.    Review of Systems  Constitutional: Negative.   HENT: Negative.   Eyes: Negative.   Respiratory: Negative.   Cardiovascular: Negative.   Gastrointestinal: Negative.   Musculoskeletal: Negative.   Skin: Negative.   Neurological: Negative.   Psychiatric/Behavioral: Positive for depression and suicidal ideas.   Physical Exam: Physical Exam ROS Blood pressure (!) 140/92, pulse 89, temperature 97.7 F (36.5 C), temperature source Oral, resp. rate 18, height 5\' 6"  (1.676 m), weight 60.8 kg, SpO2 100 %. Body mass index is 21.63 kg/m.  Treatment Plan Summary: Daily contact with patient to assess and evaluate symptoms and progress in treatment  Observation Level/Precautions:  15  minute checks  Laboratory:   Psychotherapy:    Medications:    Consultations:    Discharge Concerns:    Estimated LOS:  Other:     Physician Treatment Plan for Primary Diagnosis: <principal problem not specified> Long Term Goal(s): Improvement in symptoms so as ready for discharge  Short Term Goals: Ability to verbalize feelings will improve  Physician Treatment Plan for Secondary Diagnosis: Active Problems:   Severe recurrent major depression without psychotic features ([place])  Long Term Goal(s): Improvement in symptoms so as ready for discharge  Short Term Goals: Ability to identify and develop effective coping behaviors will improve  PLAN: Continue current home meds Hold PRN ativan Hold PRN Fentanyl  Temporarily add Oxycodone 5/325mg  q 6 hours PRN pain.   I certify that inpatient services furnished can reasonably be expected to improve the patient's condition.    Rulon Sera, MD 4/23/202211:22 AM

## 2021-02-05 NOTE — Progress Notes (Signed)
New bandage was applied to wounds and antibiotic cream.

## 2021-02-05 NOTE — [facility] Counselor (Signed)
Adult Comprehensive Assessment  Patient ID: Paul Hebert, male   DOB: 1971-01-26, 50 y.o.   MRN: [id number]  Information Source: Information source: Patient  Current Stressors:  Patient states their primary concerns and needs for treatment are:: "diagnosed with cancer and going through treatment and fiance left" Patient states their goals for this hospitilization and ongoing recovery are:: "address my addiction . . .  get on with my life" Educational / Learning stressors: none reported Employment / Job issues: "had one until going through chemo" Family Relationships: none reported Museum/gallery curator / Lack of resources (include bankruptcy): "she (exfiance) took everything" Housing / Lack of housing: none reported Physical health (include injuries & life threatening diseases): Cancer with low energy Social relationships: none reported Substance abuse: Alcohol, cocaine (see substance use section) Bereavement / Loss: "Brother died in 04-02-23 . . . sondied in 01-30-09, mother and father died while in prison"  Living/Environment/Situation:  Living Arrangements: Alone Living conditions (as described by patient or guardian): recently homeless Who else lives in the home?: n/a How long has patient lived in current situation?: 6 weeks What is atmosphere in current home: Temporary  Family History:  Marital status: Single Are you sexually active?: Yes What is your sexual orientation?: Heterosexual Has your sexual activity been affected by drugs, alcohol, medication, or emotional stress?: None noted Does patient have children?: Yes How many children?: 1 How is patient's relationship with their children?: son is deceased January 30, 2009)  Childhood History:  By whom was/is the patient raised?: Both parents Additional childhood history information: "raised well" Description of patient's relationship with caregiver when they were a child: "good" Patient's description of current relationship with people who raised  him/her: parents are deceased How were you disciplined when you got in trouble as a child/adolescent?: Reports of no problems or concerns Does patient have siblings?: Yes Number of Siblings: 7 Description of patient's current relationship with siblings: reports good relationship with some siblings and strain relationship with others. Did patient suffer any verbal/emotional/physical/sexual abuse as a child?: No Did patient suffer from severe childhood neglect?: No Has patient ever been sexually abused/assaulted/raped as an adolescent or adult?: No Was the patient ever a victim of a crime or a disaster?: No Witnessed domestic violence?: No Has patient been affected by domestic violence as an adult?: No  Education:  Highest grade of school patient has completed: GED, some Programme researcher, broadcasting/film/video. Currently a student?: No Learning disability?: Yes What learning problems does patient have?: undiagnosed  Employment/Work Situation:   Employment situation: Unemployed Patient's job has been impacted by current illness: Yes Describe how patient's job has been impacted: unable to maintain employment due to ongoing chemo and radiation treatment. 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 If attempted suicide, did drugs/alcohol play a role in this?: No Alcohol/Substance Abuse Treatment Hx: Past Tx, Inpatient,Attends AA/NA If yes, [facility] 50[facility]  [place]:   Patient's Community Support System:  Manufacturing engineer System: shared various support, sister, friends, etc. Type of faith/religion: Darrick Meigs How does patient's faith  help to cope with current illness?: expressed guilt "this is what I get for (not being a good christian)"  Leisure/Recreation:   Do You Have Hobbies?: No  Strengths/Needs:   What is the patient's perception of their strengths?: Commited Patient states they can use these personal strengths during their treatment to contribute to their recovery: none reported Patient states these barriers may affect/interfere with their treatment: none reported Patient states these barriers may affect their return to the community: none reported Other important information patient would like considered in planning for their treatment: none reported  Discharge Plan:   Currently receiving community mental health services: No Patient states concerns and preferences for aftercare planning are: Patient is interested in residential treatment and mental health aftercare. Patient states they will know when they are safe and ready for discharge when: "work on addiction and recent breakup" Does patient have access to transportation?: Yes Does patient have financial barriers related to discharge medications?: No Patient description of barriers related to discharge medications: Medicare Plan for living situation after discharge: residential substance use treatment Will patient be returning to same living situation after discharge?: No  Summary/Recommendations:   Summary and Recommendations (to be completed by the evaluator): Patient is a 50 year old male, recently leaving a long-term relationship, from [place], [place] ([place]). Patient is recently unemployed due to ongoing cancer treatment. Patient has a primary diagnosis of MDD, single episode, severe w/ out psychotic features. Patient presented to the ED voluntarily reporting suicidal ideation. During assessment,  patient presents as oriented x4, with depressed mood crying frequently throughout the assessment, patient was forthcoming with information. Patient diagnosis of cancer and ongoing may be exclusionary criteria for substance use treatment. Patient provided consent for CSW to make aftercare referral and to reach out to brother for collateral information. Recommendations include: crisis stabilization, therapeutic milieu, encouraging group attendance and participation, medication management for mood stabilization and development of comprehensive mental wellness plan.  Durenda Hurt. 02/05/2021

## 2021-02-06 MED ORDER — ADULT MULTIVITAMIN W/MINERALS CH
1.0000 | ORAL_TABLET | Freq: Every day | ORAL | Status: DC
  Administered 2021-02-07 – 2021-02-17 (×11): 1 via ORAL
  Filled 2021-02-06 (×11): qty 1

## 2021-02-06 MED ORDER — ACETAMINOPHEN 325 MG PO TABS: 325.0000 mg | ORAL_TABLET | Freq: Four times a day (QID) | ORAL | Status: DC | PRN

## 2021-02-06 MED ORDER — OXYCODONE HCL 5 MG PO TABS
5.0000 mg | ORAL_TABLET | Freq: Four times a day (QID) | ORAL | Status: DC | PRN
  Administered 2021-02-06 – 2021-02-07 (×6): 5 mg via ORAL
  Filled 2021-02-06 (×6): qty 1

## 2021-02-06 MED ORDER — TRAZODONE HCL 100 MG PO TABS
150.0000 mg | ORAL_TABLET | Freq: Every evening | ORAL | Status: DC | PRN
  Administered 2021-02-06 – 2021-02-07 (×2): 150 mg via ORAL
  Filled 2021-02-06 (×2): qty 2

## 2021-02-06 MED ORDER — ENSURE ENLIVE PO LIQD
237.0000 mL | Freq: Three times a day (TID) | ORAL | Status: DC
  Administered 2021-02-06 – 2021-02-16 (×29): 237 mL via ORAL

## 2021-02-06 NOTE — Progress Notes (Signed)
Initial Nutrition Assessment  DOCUMENTATION CODES:   Not applicable  INTERVENTION:   Ensure Enlive po TID, each supplement provides 350 kcal and 20 grams of protein  MVI po daily   NUTRITION DIAGNOSIS:   Increased nutrient needs related to cancer and cancer related treatments as evidenced by estimated needs.  GOAL:   Patient will meet greater than or equal to 90% of their needs  MONITOR:   PO intake,Labs,Weight trends,Skin,I & O's  REASON FOR ASSESSMENT:   Malnutrition Screening Tool    ASSESSMENT:   50 y.o. 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  RD working remotely.  Pt with h/o radiation mucositis requiring surgical G-tube and nutrition support. Pt being followed by the RD at the cancer center. Per RD's last note, pt non-compliant with tube feed regimen; pt only giving 2-3 cans Osmolite 1.5 daily r/t diarrhea. Pt now s/p removal of G-tube and RIJ port in the ED prior to admission to behavior health. Pt has been eating 100% of meals in hospital. RD will add supplements and MVI to help pt meet his estimated needs. Per chart, pt down 10lbs(8%) since January.   Medications reviewed and include: nicotine, protonix, oxycodone  Labs reviewed:   Diet Order:   Diet Order            Diet regular Room service appropriate? Yes; Fluid consistency: Thin  Diet effective now                EDUCATION NEEDS:   No education needs have been identified at this time  Skin:  Skin Assessment: Reviewed RN Assessment (closed incision abdomen and chest)  Last BM:  pta  Height:   Ht Readings from Last 1 Encounters:  02/04/21 5\' 6"  (1.676 m)    Weight:   Wt Readings from Last 1 Encounters:  02/04/21 60.8 kg    Ideal Body Weight:  64.5 kg  BMI:   Body mass index is 21.63 kg/m.  Estimated Nutritional Needs:   Kcal:  2000-2300kcal/day  Protein:  100-115g/day  Fluid:  1.8-2.1L/day  Koleen Distance MS, RD, LDN Please refer to [facility] for RD and/or RD on-call/weekend/after hours pager

## 2021-02-06 NOTE — Progress Notes (Addendum)
[facility] MD Progress Note  02/06/2021 11:33 AM Paul Hebert  MRN:  [id number] Subjective:   Patient endorses continued depression that is now moderate in intensity; denies having suicidal thoughts. Says that his mood is touch and go, feels bored on the unit. Says that he likes to keep busy, either reading or working. 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 Reports a robust appetite and wants double portions. Denies cravings for drugs. Still thinks about his ex girlfriend who he was going to propose to. Feels somewhat hopeless of how he will manage to get back on his feet at the age of 50 years old. Says that he has no savings at this time. No new medical problems. No side effects on the medications. Denies excessive anxiety. No panic attacks.   Principal Problem: <principal problem not specified> Diagnosis: Active Problems:   Severe recurrent major depression without psychotic features ([place])   Total Time spent with patient: 30 minutes  Past Medical History:  Past Medical History:  Diagnosis Date  . 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 PORT-A-CATH REMOVAL N/A 02/04/2021   Procedure: REMOVAL PORT-A-CATH;  Surgeon: Olean Ree, MD;  Location: [facility] ORS;  Service: General;  Laterality: N/A;  . PORTA CATH INSERTION N/A 10/25/2020   Procedure: PORTA CATH INSERTION;  Surgeon: Algernon Huxley, MD;  Location: [facility];  Service: Cardiovascular;  Laterality: N/A;  . REMOVAL OF GASTROSTOMY TUBE N/A 02/04/2021   Procedure: REMOVAL OF GASTROSTOMY TUBE;  Surgeon: Olean Ree, MD;  Location: [facility] ORS;  Service: General;  Laterality: N/A;   Family History:  Family History  Problem Relation Age of Onset  . Cancer Father    Social History:  Social History   Substance and Sexual Activity  Alcohol Use Yes   Comment: 12 beers a day     Social History   Substance and Sexual Activity  Drug  Use Yes  . Types: Marijuana, "Crack" cocaine    Social History   Socioeconomic History  . Marital status: Single    Spouse name: Not on file  . Number of children: Not on file  . Years of education: Not on file  . Highest education level: Not on file  Occupational History  . Not on file  Tobacco Use  . Smoking status: Current Every Day Smoker    Packs/day: 0.50  . Smokeless tobacco: Never Used  Vaping Use  . Vaping Use: Never used  Substance and Sexual Activity  . Alcohol use: Yes    Comment: 12 beers a day  . Drug use: Yes    Types: Marijuana, "Crack" cocaine  . Sexual activity: Not Currently  Other Topics Concern  . Not on file  Social History Narrative  . Not on file   Social Determinants of Health   Financial Resource Strain: Not on file  Food Insecurity: Not on file  Transportation Needs: Not on file  Physical Activity: Not on file  Stress: Not on file  Social Connections: Not on file   Additional Social History:                         Sleep: poor  Appetite:  Fair  Current Medications: Current Facility-Administered Medications  Medication Dose Route Frequency Provider Last Rate Last Admin  . oxyCODONE (Oxy IR/ROXICODONE) immediate release tablet 5 mg  5 mg Oral Q6H  PRN Lu Duffel, RPH   5 mg at 02/06/21 8295   And  . acetaminophen (TYLENOL) tablet 325 mg  325 mg Oral Q6H PRN Shanlever, Pierce Crane, RPH      . acetaminophen (TYLENOL) tablet 650 mg  650 mg Oral Q6H PRN Clapacs, John T, MD      . alum & mag hydroxide-simeth (MAALOX/MYLANTA) 200-200-20 MG/5ML suspension 30 mL  30 mL Oral Q4H PRN Clapacs, John T, MD      . escitalopram (LEXAPRO) tablet 10 mg  10 mg Oral Daily Clapacs, Madie Reno, MD   10 mg at 02/06/21 0824  . gabapentin (NEURONTIN) capsule 300 mg  300 mg Oral TID Clapacs, John T, MD   300 mg at 02/06/21 6213  . hydrOXYzine (ATARAX/VISTARIL) tablet 50 mg  50 mg Oral TID PRN Clapacs, Madie Reno, MD   50 mg at 02/05/21 2111  .  magnesium hydroxide (MILK OF MAGNESIA) suspension 30 mL  30 mL Oral Daily PRN Clapacs, Madie Reno, MD      . neomycin-bacitracin-polymyxin (NEOSPORIN) ointment   Topical Daily Clapacs, Madie Reno, MD   Given at 02/06/21 [phone]  . nicotine (NICODERM CQ - dosed in mg/24 hours) patch 14 mg  14 mg Transdermal Daily Clapacs, Madie Reno, MD   14 mg at 02/06/21 0824  . pantoprazole (PROTONIX) EC tablet 20 mg  20 mg Oral Daily Clapacs, Madie Reno, MD   20 mg at 02/06/21 0825  . traZODone (DESYREL) tablet 100 mg  100 mg Oral QHS PRN Clapacs, Madie Reno, MD   100 mg at 02/05/21 2111    Lab Results:  Results for orders placed or performed during the hospital encounter of 01/28/21 (from the past 48 hour(s))  Resp Panel by RT-PCR (Flu A&B, Covid) Nasopharyngeal Swab     Status: None   Collection Time: 02/04/21  6:29 PM   Specimen: Nasopharyngeal Swab; Nasopharyngeal(NP) swabs in vial transport medium  Result Value Ref Range   SARS Coronavirus 2 by RT PCR NEGATIVE NEGATIVE    Comment: (NOTE) SARS-CoV-2 target nucleic acids are NOT DETECTED.  The SARS-CoV-2 RNA is generally detectable in upper respiratory specimens during the acute phase of infection. The lowest concentration of SARS-CoV-2 viral copies this assay can detect is 138 copies/mL. A negative result does not preclude SARS-Cov-2 infection and should not be used as the sole basis for treatment or other patient management decisions. A negative result may occur with  improper specimen collection/handling, submission of specimen other than nasopharyngeal swab, presence of viral mutation(s) within the areas targeted by this assay, and inadequate number of viral copies(<138 copies/mL). A negative result must be combined with clinical observations, patient history, and epidemiological information. The expected result is Negative.  Fact Sheet for Patients:  EntrepreneurPulse.com.au  Fact Sheet for Healthcare Providers:   IncredibleEmployment.be  This test is no t yet approved or cleared by the [place] FDA and  has been authorized for detection and/or diagnosis of SARS-CoV-2 by FDA under an Emergency Use Authorization (EUA). This EUA will remain  in effect (meaning this test can be used) for the duration of the COVID-19 declaration under Section 564(b)(1) of the Act, 21 U.S.C.section 360bbb-3(b)(1), unless the authorization is terminated  or revoked sooner.       Influenza A by PCR NEGATIVE NEGATIVE   Influenza B by PCR NEGATIVE NEGATIVE    Comment: (NOTE) The Xpert Xpress SARS-CoV-2/FLU/RSV plus assay is intended as an aid in the diagnosis of influenza from Nasopharyngeal swab  specimens and should not be used as a sole basis for treatment. Nasal washings and aspirates are unacceptable for Xpert Xpress SARS-CoV-2/FLU/RSV testing.  Fact Sheet for Patients: EntrepreneurPulse.com.au  Fact Sheet for Healthcare Providers: IncredibleEmployment.be  This test is not yet approved or cleared by the [place] FDA and has been authorized for detection and/or diagnosis of SARS-CoV-2 by FDA under an Emergency Use Authorization (EUA). This EUA will remain in effect (meaning this test can be used) for the duration of the COVID-19 declaration under Section 564(b)(1) of the Act, 21 U.S.C. section 360bbb-3(b)(1), unless the authorization is terminated or revoked.  Performed at [facility], [address] [place], [place] [address]     Blood Alcohol level:  Lab Results  Component Value Date   ETH <10 01/28/2021   ETH 222 (H) 96/22/2979    Metabolic Disorder Labs: No results found for: HGBA1C, MPG No results found for: PROLACTIN No results found for: CHOL, TRIG, HDL, CHOLHDL, VLDL, LDLCALC Physical Exam Vitalsand nursing notereviewed.  Constitutional:  Appearance: Normal appearance.  HENT:  Head: Normocephalicand  atraumatic.  Mouth/Throat:  Pharynx: Oropharynx is clear.  Eyes:  Pupils: Pupils are equal, round, and reactive to light.  Cardiovascular:  Rate and Rhythm: Normal rateand regular rhythm.  Pulmonary:  Effort: Pulmonary effort is normal.  Breath sounds: Normal breath sounds.  Abdominal:  General: Abdomen is flat.  Palpations: Abdomen is soft.  Musculoskeletal:  General: Normal range of motion.  Skin: General: Skin is warmand dry.  Neurological:  General: No focal deficitpresent.  Mental Status: He is alert. Mental status isat baseline.  Psychiatric:  Attention and Perception: Attentionnormal.  Mood and Affect: Mood is down. Affect restricted.  Speech: Speech is wnl.  Thought Content: Thought content includes no suicidalideation.  Cognition and Memory: Cognitionnormal.  Judgment: Judgmentnormal.    Review of Systems  Constitutional:Negative.  HENT:Negative.  Eyes:Negative.  Respiratory:Negative.  Cardiovascular:Negative.  Gastrointestinal:Negative.  Musculoskeletal:Negative.  Skin:Negative.  Neurological:Negative.  Psychiatric/Behavioral: Positive fordepression  Physical Exam: Physical Exam ROS Blood pressure (!) 127/92, pulse 86, temperature 98.6 F (37 C), temperature source Oral, resp. rate 18, height 5\' 6"  (1.676 m), weight 60.8 kg, SpO2 100 %. Body mass index is 21.63 kg/m.   Treatment Plan Summary: 4/23 Continue current home meds Hold PRN ativan Hold PRN Fentanyl  Temporarily add Oxycodone 5/325mg  q 6 hours PRN pain.   4/24 Appreciate Surgery seeing the pt yesterday for s/u.  Increase Trazodone to 150mg  PO qHS Prn insomnia Patient requesting double portions. Will discuss with nurse.    CPT: [place], MD 02/06/2021, 11:33 AM

## 2021-02-06 NOTE — Progress Notes (Signed)
Paul Hebert is pleasant and calm. He denies SI, HI and AVH. His main complaint is pain due to esophageal cancer history and recent surgeries. Pain is managed with oxycodone. His goal today is to keep his "spirits up". He is seen interacting with others safely on the unit. He spent some time outside with the other patients and took a shower afterwards. Wounds on his chest and abdomen are healing well. The surgeon told him he could remove his dressings starting today. Will continue to monitor with 15 minute safety checks.

## 2021-02-06 NOTE — Progress Notes (Signed)
Patient is alert and oriented x 4, affect is blunted, he forwards information and receptive to staff, his thoughts are organized and coherent, he acknowledged pain earlier during the shift he stated from removing a peg tube from ABD area. Patient was noted interacting appropriately with peers and staff, he rated depression a 7/10 ( 0 low - high 10 ) he was offered emotional support and encouragement, he was compliant with medication regimen, will continue to closely monitor.

## 2021-02-06 NOTE — Progress Notes (Signed)
Patient pleasant and easy to engage in conversation.  Reports doing much better. Surgical port sites healing well with no signs of infection.  Patient med compliant and received medication without incident.  Denies SI  HI AVH at this encounter. Continues to endorse depression and tells about his long time girlfriend leaving him at the same time he was having to deal with his health issues. He reports getting better and slowly coming to terms with his situation.  He is safe on the unit with 15 minute safety checks and encouraged to come to staff with any concerns.    Cleo Butler-Nicholson, LPN

## 2021-02-06 NOTE — Progress Notes (Signed)
02/06/2021  Subjective: Patient is POD# 2 s/p excision of right IJ port-a-cath and removal of G tube.  No acute events.  Patient reports soreness over the port-a-cath area, and reports the G tube side has almost healed.  Vital signs: Temp:  [97.7 F (36.5 C)-98.6 F (37 C)] 98.6 F (37 C) (04/24 0640) Pulse Rate:  [86-91] 86 (04/24 0640) Resp:  [16-18] 18 (04/24 0640) BP: (127-141)/(92-98) 127/92 (04/24 0640) SpO2:  [100 %] 100 % (04/24 0640)   Intake/Output: No intake/output data recorded.    Physical Exam: Constitutional:  No acute distress Skin:  Right upper chest incision is healing well, with some mild swelling and ecchymosis.  No evidence of infection.  LUQ G tube site is almost closed, with only minimal drainage noted on gauze dressing.  Labs:  No results for input(s): WBC, HGB, HCT, PLT in the last 72 hours. No results for input(s): NA, K, CL, CO2, GLUCOSE, BUN, CREATININE, CALCIUM in the last 72 hours.  Invalid input(s): MAGNESIUM No results for input(s): LABPROT, INR in the last 72 hours.  Imaging: No results found.  Assessment/Plan: This is a 50 y.o. male s/p excision of port-a-cath and G tube  --Patient doing well from surgical standpoint.  Continue dressing changes to G tube site until fully healed, which should be soon.  Right upper chest dressing as needed or for comfort. --Surgery team will sign off for now. Please feel free to call/consult if any questions/concerns.   Melvyn Neth, [facility]

## 2021-02-06 NOTE — Plan of Care (Signed)
  Problem: Coping: Goal: Coping ability will improve Outcome: Progressing Goal: Will verbalize feelings Outcome: Progressing   Problem: Health Behavior/Discharge Planning: Goal: Ability to make decisions will improve Outcome: Progressing Goal: Compliance with therapeutic regimen will improve Outcome: Progressing   Problem: Education: Goal: Utilization of techniques to improve thought processes will improve Outcome: Progressing Goal: Knowledge of the prescribed therapeutic regimen will improve Outcome: Progressing

## 2021-02-07 ENCOUNTER — Ambulatory Visit: Payer: Medicaid Other

## 2021-02-07 DIAGNOSIS — Z510 Encounter for antineoplastic radiation therapy: Secondary | ICD-10-CM | POA: Diagnosis not present

## 2021-02-07 DIAGNOSIS — F332 Major depressive disorder, recurrent severe without psychotic features: Secondary | ICD-10-CM

## 2021-02-07 NOTE — Progress Notes (Signed)
CSW provided patient with contact information to [facility] to discuss aftercare substance use treatment. Patient observed calling facilities and reports that he is not appropriate for [facility] as he has two radiation treatments remaining.  Signed:  Durenda Hurt, MSW, [place], LCASA 02/07/2021 12:04 PM

## 2021-02-07 NOTE — [facility] Group Notes (Signed)
LCSW Group Therapy Note     02/07/2021 3:30 PM     Type of Therapy and Topic:  Group Therapy:  Overcoming Obstacles     Participation Level:  Active     Description of Group:     In this group patients will be encouraged to explore what they see as obstacles to their own wellness and recovery. They will be guided to discuss their thoughts, feelings, and behaviors related to these obstacles. The group will process together ways to cope with barriers, with attention given to specific choices patients can make. Each patient will be challenged to identify changes they are motivated to make in order to overcome their obstacles. This group will be process-oriented, with patients participating in exploration of their own experiences as well as giving and receiving support and challenge from other group members.     Therapeutic Goals:  1.    Patient will identify personal and current obstacles as they relate to admission.  2.    Patient will identify barriers that currently interfere with their wellness or overcoming obstacles.  3.    Patient will identify feelings, thought process and behaviors related to these barriers.  4.    Patient will identify two changes they are willing to make to overcome these obstacles:        Summary of Patient Progress: Patient was present for the entirety of the group session. Patient was an active listener and participated in the topic of discussion, provided helpful advice to others, and added nuance to topic of conversation. Pt discussed how his major obstacle is addiction. He stated that he is ready to make a change and but states he has a long journey. He discussed how he has experienced major loss and is starting over, "by himself" in many ways. He said that he would strive to take "baby steps" in working towards completing his radiation treatment and attending an inpatient addiction treatment facility.      Therapeutic Modalities:    Cognitive  Behavioral Therapy  Solution Focused Therapy  Motivational Interviewing  Relapse Prevention Therapy     Paul Hebert, MSW, [place]  02/07/2021 3:30 PM

## 2021-02-07 NOTE — Plan of Care (Signed)
Patient compliant with procedures on the unit   Problem: Health Behavior/Discharge Planning: Goal: Compliance with therapeutic regimen will improve Outcome: Progressing

## 2021-02-07 NOTE — Tx Team (Addendum)
Interdisciplinary Treatment and Diagnostic Plan Update  02/07/2021 Time of Session: 09:00AM Paul Hebert MRN: [id number]  Principal Diagnosis: <principal problem not specified>  Secondary Diagnoses: Active Problems:   Severe recurrent major depression without psychotic features ([place])   Current Medications:  Current Facility-Administered Medications  Medication Dose Route Frequency Provider Last Rate Last Admin  . oxyCODONE (Oxy IR/ROXICODONE) immediate release tablet 5 mg  5 mg Oral Q6H PRN Lu Duffel, RPH   5 mg at 02/07/21 5573   And  . acetaminophen (TYLENOL) tablet 325 mg  325 mg Oral Q6H PRN Shanlever, Pierce Crane, RPH      . acetaminophen (TYLENOL) tablet 650 mg  650 mg Oral Q6H PRN Clapacs, John T, MD      . alum & mag hydroxide-simeth (MAALOX/MYLANTA) 200-200-20 MG/5ML suspension 30 mL  30 mL Oral Q4H PRN Clapacs, John T, MD      . escitalopram (LEXAPRO) tablet 10 mg  10 mg Oral Daily Clapacs, Madie Reno, MD   10 mg at 02/07/21 0755  . feeding supplement (ENSURE ENLIVE / ENSURE PLUS) liquid 237 mL  237 mL Oral TID BM Clapacs, John T, MD   237 mL at 02/06/21 2030  . gabapentin (NEURONTIN) capsule 300 mg  300 mg Oral TID Clapacs, Madie Reno, MD   300 mg at 02/07/21 0755  . hydrOXYzine (ATARAX/VISTARIL) tablet 50 mg  50 mg Oral TID PRN Clapacs, Madie Reno, MD   50 mg at 02/06/21 2128  . magnesium hydroxide (MILK OF MAGNESIA) suspension 30 mL  30 mL Oral Daily PRN Clapacs, John T, MD      . multivitamin with minerals tablet 1 tablet  1 tablet Oral Daily Clapacs, Madie Reno, MD   1 tablet at 02/07/21 0755  . neomycin-bacitracin-polymyxin (NEOSPORIN) ointment   Topical Daily Clapacs, Madie Reno, MD   1 application at 22/02/54 0800  . nicotine (NICODERM CQ - dosed in mg/24 hours) patch 14 mg  14 mg Transdermal Daily Clapacs, John T, MD   14 mg at 02/07/21 0800  . pantoprazole (PROTONIX) EC tablet 20 mg  20 mg Oral Daily Clapacs, Madie Reno, MD   20 mg at 02/07/21 0755  . traZODone (DESYREL) tablet 150  mg  150 mg Oral QHS PRN Rulon Sera, MD   150 mg at 02/06/21 2128   PTA Medications: Medications Prior to Admission  Medication Sig Dispense Refill Last Dose  . amoxicillin-clavulanate (AUGMENTIN) 875-125 MG tablet Take 1 tablet by mouth 2 (two) times daily. 14 tablet 0   . COVID-19 mRNA vaccine, Pfizer, 30 MCG/0.3ML injection AS DIRECTED .3 mL 0   . dexamethasone (DECADRON) 4 MG tablet Take 2 tablets (8 mg total) by mouth daily. Take daily x 3 days starting the day after cisplatin chemotherapy. Take with food. (Patient taking differently: Take 8 mg by mouth See admin instructions. Take 8 mg daily x 3 days starting the day after cisplatin chemotherapy. Take with food.) 30 tablet 1   . escitalopram (LEXAPRO) 10 MG tablet Take 1 tablet (10 mg total) by mouth daily. Take half pill per day for 5 days then increase to one pill per day 30 tablet 1   . fentaNYL (DURAGESIC) 12 MCG/HR Place 1 patch onto the skin every 3 (three) days. This is additonal to the 25 mcg to make 37 mcg total 10 patch 0   . fentaNYL (DURAGESIC) 25 MCG/HR Place 1 patch onto the skin every 3 (three) days. This is additonal to the12 mcg to  make 37 mcg total 10 patch 0   . fluconazole (DIFLUCAN) 100 MG tablet Take 1 tablet (100 mg total) by mouth daily. 7 tablet 0   . gabapentin (NEURONTIN) 300 MG capsule TAKE 1 CAPSULE (300 MG) BY MOUTH 3 TIMESDAILY 90 capsule 2   . lidocaine-prilocaine (EMLA) cream Apply to affected area once (Patient taking differently: Apply 1 application topically daily as needed (port access).) 30 g 3   . loperamide (IMODIUM) 2 MG capsule Take 1 capsule (2 mg total) by mouth as needed for diarrhea or loose stools (take 2 tab with first loose stool, then 1 tab. after every loose stool after that and maximum 8 a day). 48 capsule 0   . LORazepam (ATIVAN) 0.5 MG tablet Take 1 tablet (0.5 mg total) by mouth every 8 (eight) hours. 60 tablet 0   . Nutritional Supplements (FEEDING SUPPLEMENT, OSMOLITE 1.5 CAL,) LIQD Give  1 1/2 cartons of formula 4 times per day via feeding tube (8am, noon, 4pm and 8pm). Flush with 22m of water before and after each feeding. Drink or give additional 2421mwater TID between feedings via tube for better hydration. 1422 mL 0   . nystatin (MYCOSTATIN) 100000 UNIT/ML suspension Take 5 mLs (500,000 Units total) by mouth 4 (four) times daily. 473 mL 1   . ondansetron (ZOFRAN) 8 MG tablet Take 1 tablet (8 mg total) by mouth 2 (two) times daily as needed. Start on the third day after cisplatin chemotherapy. 30 tablet 1   . Oxycodone HCl 20 MG TABS Take 1 tablet (20 mg total) by mouth every 4 (four) hours as needed. 180 tablet 0   . pantoprazole (PROTONIX) 20 MG tablet TAKE 1 TABLET BY MOUTH DAILY. 30 tablet 1   . prochlorperazine (COMPAZINE) 10 MG tablet Take 1 tablet (10 mg total) by mouth every 6 (six) hours as needed (Nausea or vomiting). 30 tablet 1   . sertraline (ZOLOFT) 50 MG tablet Take 1 tablet (50 mg total) by mouth daily. (Patient not taking: No sig reported) 30 tablet 2     Patient Stressors:    Patient Strengths:    Treatment Modalities: Medication Management, Group therapy, Case management,  1 to 1 session with clinician, Psychoeducation, Recreational therapy.   Physician Treatment Plan for Primary Diagnosis: <principal problem not specified> Long Term Goal(s): Improvement in symptoms so as ready for discharge Improvement in symptoms so as ready for discharge   Short Term Goals: Ability to verbalize feelings will improve Ability to identify and develop effective coping behaviors will improve  Medication Management: Evaluate patient's response, side effects, and tolerance of medication regimen.  Therapeutic Interventions: 1 to 1 sessions, Unit Group sessions and Medication administration.  Evaluation of Outcomes: Not Met  Physician Treatment Plan for Secondary Diagnosis: Active Problems:   Severe recurrent major depression without psychotic features ([place]  Term Goal(s): Improvement in symptoms so as ready for discharge Improvement in symptoms so as ready for discharge   Short Term Goals: Ability to verbalize feelings will improve Ability to identify and develop effective coping behaviors will improve     Medication Management: Evaluate patient's response, side effects, and tolerance of medication regimen.  Therapeutic Interventions: 1 to 1 sessions, Unit Group sessions and Medication administration.  Evaluation of Outcomes: Not Met   RN Treatment Plan for Primary Diagnosis: <principal problem not specified> Long Term Goal(s): Knowledge of disease and therapeutic regimen to maintain health will improve  Short Term Goals: Ability to verbalize frustration and anger  appropriately will improve, Ability to demonstrate self-control, Ability to participate in decision making will improve, Ability to verbalize feelings will improve, Ability to disclose and discuss suicidal ideas and Ability to identify and develop effective coping behaviors will improve  Medication Management: RN will administer medications as ordered by provider, will assess and evaluate patient's response and provide education to patient for prescribed medication. RN will report any adverse and/or side effects to prescribing provider.  Therapeutic Interventions: 1 on 1 counseling sessions, Psychoeducation, Medication administration, Evaluate responses to treatment, Monitor vital signs and CBGs as ordered, Perform/monitor CIWA, COWS, AIMS and Fall Risk screenings as ordered, Perform wound care treatments as ordered.  Evaluation of Outcomes: Not Met   LCSW Treatment Plan for Primary Diagnosis: <principal problem not specified> Long Term Goal(s): Safe transition to appropriate next level of care at discharge, Engage patient in therapeutic group addressing interpersonal concerns.  Short Term Goals: Engage patient in aftercare planning with referrals and resources, Increase social  support, Increase ability to appropriately verbalize feelings, Increase emotional regulation, Facilitate acceptance of mental health diagnosis and concerns, Facilitate patient progression through stages of change regarding substance use diagnoses and concerns, Identify triggers associated with mental health/substance abuse issues and Increase skills for wellness and recovery  Therapeutic Interventions: Assess for all discharge needs, 1 to 1 time with Social worker, Explore available resources and support systems, Assess for adequacy in community support network, Educate family and significant other(s) on suicide prevention, Complete Psychosocial Assessment, Interpersonal group therapy.  Evaluation of Outcomes: Not Met   Progress in Treatment: Attending groups: Yes. Participating in groups: Yes. Taking medication as prescribed: Yes. Toleration medication: Yes. Family/Significant other contact made: No, will contact:  pt's brother Patient understands diagnosis: Yes. Discussing patient identified problems/goals with staff: Yes. Medical problems stabilized or resolved: Yes. Denies suicidal/homicidal ideation: Yes. Issues/concerns per patient self-inventory: No. Other: None  New problem(s) identified: No, Describe:  None  New Short Term/Long Term Goal(s):  Engage patient in aftercare planning with referrals and resources, Increase social support, Increase ability to appropriately verbalize feelings, Increase emotional regulation, Facilitate acceptance of mental health diagnosis and concerns, Facilitate patient progression through stages of change regarding substance use diagnoses and concerns, Identify triggers associated with mental health/substance abuse issues and Increase skills for wellness and recovery    Patient Goals:  "Getting clean and sober and fighting this depression." "Figure out my triggers and make a plan when they happen."  Discharge Plan or Barriers: CSW will assist pt in  obtaining transportation and follow-up care upon discharge.   Reason for Continuation of Hospitalization: Depression Suicidal ideation Withdrawal symptoms  Estimated Length of Stay: 1-7 days  Recreational Therapy: Patient Stressors: N/A Patient Goal: Patient will engage in groups without prompting or encouragement from LRT x3 group sessions within 5 recreation therapy group sessions.  Attendees: Patient: Taegen Delker. Wieland 02/07/2021 9:34 AM  Physician: Salley Scarlet, MD  02/07/2021 9:34 AM  Nursing: Marla Roe, RN  02/07/2021 9:34 AM  RN Care Manager: 02/07/2021 9:34 AM  Social Worker: Hedy Camara "Robbie" Zimmerman, MSW, [place], LCAS 02/07/2021 9:34 AM  Recreational Therapist: Roanna Epley, Reather Converse, LRT  02/07/2021 9:34 AM  Other: Kiva Martinique, MSW, LCSW-A  02/07/2021 9:34 AM  Other: Paulla Dolly, [place], LCAS-A 02/07/2021 9:34 AM  Other: 02/07/2021 9:34 AM    Scribe for Treatment Team: Kiva A [place], [place] 02/07/2021 9:34 AM

## 2021-02-07 NOTE — Progress Notes (Signed)
[facility] MD Progress Note  02/07/2021 11:49 AM Paul Hebert  MRN:  [id number]   CC "I'm frustrated"  Subjective:  50 year old male presenting for worsening mood and suicidal ideations. No acute events overnight, medication compliant, attending to ADLs.  Patient seen one-on-one today. He notes frustration today as he is being declined by multiple residential substance abuse treatment programs due to his active need for radiation. He does express a desire to complete his radiation sessions. He denies any suicidal ideations, homicidal ideations, visual hallucinations, and auditory hallucinations. He does continue to endorse depression and anxiety. He also notes some concern about where he will live upon discharge. Radiation team contacted today, and they are able to restart radiation today at 2pm. Charge nurse notified to coordinate transfer to and from radiation treatment.  Principal Problem: Severe recurrent major depression without psychotic features ([place]) Diagnosis: Principal Problem:   Severe recurrent major depression without psychotic features ([place]) Active Problems:   Squamous cell carcinoma of oropharynx (HCC)   Cocaine abuse ([place])   Alcohol abuse  Total Time spent with patient: 30 minutes  Past Psychiatric History: See H&P  Past Medical History:  Past Medical History:  Diagnosis Date  . 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 PORT-A-CATH REMOVAL N/A 02/04/2021   Procedure: REMOVAL PORT-A-CATH;  Surgeon: Olean Ree, MD;  Location: [facility] ORS;  Service: General;  Laterality: N/A;  . PORTA CATH INSERTION N/A 10/25/2020   Procedure: PORTA CATH INSERTION;  Surgeon: Algernon Huxley, MD;  Location: [facility];  Service: Cardiovascular;  Laterality: N/A;  . REMOVAL OF GASTROSTOMY TUBE N/A 02/04/2021   Procedure: REMOVAL OF GASTROSTOMY TUBE;  Surgeon: Olean Ree, MD;  Location: [facility] ORS;  Service: General;  Laterality: N/A;   Family History:   Family History  Problem Relation Age of Onset  . Cancer Father    Family Psychiatric  History: See H&P Social History:  Social History   Substance and Sexual Activity  Alcohol Use Yes   Comment: 12 beers a day     Social History   Substance and Sexual Activity  Drug Use Yes  . Types: Marijuana, "Crack" cocaine    Social History   Socioeconomic History  . Marital status: Single    Spouse name: Not on file  . Number of children: Not on file  . Years of education: Not on file  . Highest education level: Not on file  Occupational History  . Not on file  Tobacco Use  . Smoking status: Current Every Day Smoker    Packs/day: 0.50  . Smokeless tobacco: Never Used  Vaping Use  . Vaping Use: Never used  Substance and Sexual Activity  . Alcohol use: Yes    Comment: 12 beers a day  . Drug use: Yes    Types: Marijuana, "Crack" cocaine  . Sexual activity: Not Currently  Other Topics Concern  . Not on file  Social History Narrative  . Not on file   Social Determinants of Health   Financial Resource Strain: Not on file  Food Insecurity: Not on file  Transportation Needs: Not on file  Physical Activity: Not on file  Stress: Not on file  Social Connections: Not on file   Additional Social History:                         Sleep: Poor  Appetite:  Good  Current Medications: Current Facility-Administered  Medications  Medication Dose Route Frequency Provider Last Rate Last Admin  . oxyCODONE (Oxy IR/ROXICODONE) immediate release tablet 5 mg  5 mg Oral Q6H PRN Lu Duffel, RPH   5 mg at 02/07/21 1610   And  . acetaminophen (TYLENOL) tablet 325 mg  325 mg Oral Q6H PRN Shanlever, Pierce Crane, RPH      . acetaminophen (TYLENOL) tablet 650 mg  650 mg Oral Q6H PRN Clapacs, John T, MD      . alum & mag hydroxide-simeth (MAALOX/MYLANTA) 200-200-20 MG/5ML suspension 30 mL  30 mL Oral Q4H PRN Clapacs, John T, MD      . escitalopram (LEXAPRO) tablet 10 mg  10 mg  Oral Daily Clapacs, Madie Reno, MD   10 mg at 02/07/21 0755  . feeding supplement (ENSURE ENLIVE / ENSURE PLUS) liquid 237 mL  237 mL Oral TID BM Clapacs, John T, MD   237 mL at 02/07/21 1058  . gabapentin (NEURONTIN) capsule 300 mg  300 mg Oral TID Clapacs, Madie Reno, MD   300 mg at 02/07/21 0755  . hydrOXYzine (ATARAX/VISTARIL) tablet 50 mg  50 mg Oral TID PRN Clapacs, Madie Reno, MD   50 mg at 02/06/21 2128  . magnesium hydroxide (MILK OF MAGNESIA) suspension 30 mL  30 mL Oral Daily PRN Clapacs, John T, MD      . multivitamin with minerals tablet 1 tablet  1 tablet Oral Daily Clapacs, Madie Reno, MD   1 tablet at 02/07/21 0755  . neomycin-bacitracin-polymyxin (NEOSPORIN) ointment   Topical Daily Clapacs, Madie Reno, MD   1 application at 96/04/54 0800  . nicotine (NICODERM CQ - dosed in mg/24 hours) patch 14 mg  14 mg Transdermal Daily Clapacs, John T, MD   14 mg at 02/07/21 0800  . pantoprazole (PROTONIX) EC tablet 20 mg  20 mg Oral Daily Clapacs, Madie Reno, MD   20 mg at 02/07/21 0755  . traZODone (DESYREL) tablet 150 mg  150 mg Oral QHS PRN Rulon Sera, MD   150 mg at 02/06/21 2128    Lab Results: No results found for this or any previous visit (from the past 10 hour(s)).  Blood Alcohol level:  Lab Results  Component Value Date   ETH <10 01/28/2021   ETH 222 (H) 09/81/1914    Metabolic Disorder Labs: No results found for: HGBA1C, MPG No results found for: PROLACTIN No results found for: CHOL, TRIG, HDL, CHOLHDL, VLDL, LDLCALC  Physical Findings: AIMS:  , ,  ,  ,    CIWA:    COWS:     Musculoskeletal: Strength & Muscle Tone: within normal limits Gait & Station: normal Patient leans: N/A  Psychiatric Specialty Exam:  Presentation  General Appearance: Casual  Eye Contact:Good  Speech:Normal Rate  Speech Volume:Normal  Handedness:Right   Mood and Affect  Mood:Anxious; Depressed  Affect:Congruent   Thought Process  Thought Processes:Goal Directed  Descriptions of  Associations:Intact  Orientation:Full (Time, Place and Person)  Thought Content:Logical  History of Schizophrenia/Schizoaffective disorder:No  Duration of Psychotic Symptoms:No data recorded Hallucinations:Hallucinations: Other (comment)  Ideas of Reference:None  Suicidal Thoughts:Suicidal Thoughts: No  Homicidal Thoughts:Homicidal Thoughts: No   Sensorium  Memory:Immediate Good; Recent Good; Remote Good  Judgment:Intact  Insight:Fair   Executive Functions  Concentration:Good  Attention Span:Good  [place] of Knowledge:Good  Language:Good   Psychomotor Activity  Psychomotor Activity:Psychomotor Activity: Normal   Assets  Assets:Communication Skills; Desire for Improvement; Financial Resources/Insurance; Social Support; Talents/Skills   Sleep  Sleep:Sleep: -- (  Poor. Patient notes he can fall asleep quickly, but has multiple night time awakening and early morning awakening) Number of Hours of Sleep: 7.75    Physical Exam: Physical Exam ROS Blood pressure (!) 125/103, pulse 91, temperature 98.4 F (36.9 C), temperature source Oral, resp. rate 18, height 5\' 6"  (1.676 m), weight 60.8 kg, SpO2 100 %. Body mass index is 21.63 kg/m.   Treatment Plan Summary: Daily contact with patient to assess and evaluate symptoms and progress in treatment and Medication management   MDD, recurrent, severe, without psychotic features - Lexapro 10 mg QHS  Alcohol use Disorder, Cocaine Use Disorder - Cessation counseling, desires residential treatment up on discharge  Squamous Cell Carcinoma of Oropharynx - Resume radiation treatments today - Oxycodone 5 mg Q6hr PRN - Gabapentin 300 mg TID   Removal of port-a-cath and and gastrostomy tube - Sites healing well, neosporin to area   Salley Scarlet, MD 02/07/2021, 11:49 AM

## 2021-02-07 NOTE — Progress Notes (Signed)
Patient is calm and cooperative with assessment. He denies SI, HI, and AVH. Patient says he is feeling much better and is working through the issues that caused his depression. Surgical sites healing well with no signs of infection. Patient rates surgical pain as a 5/10 and received PRN pain medication. He remains medication compliant. Patient is active on the unit and interacts appropriately with staff and peers. Support and encouragement provided. Patient remains safe on the unit at this time and q15 min safety checks are maintained.

## 2021-02-07 NOTE — Progress Notes (Signed)
Patient calm and cooperative during assessment denying SI/HI/AVH. Patient observed interacting appropriately with staff and peers on the unit. Patient compliant with medication administration per MD orders. Pt given education, support, and encouragement to be active in his treatment plan. Patient being monitored Q 15 minutes for safety per unit protocol. Pt remains safe on the unit.  

## 2021-02-08 ENCOUNTER — Ambulatory Visit: Payer: Medicaid Other

## 2021-02-08 DIAGNOSIS — Z510 Encounter for antineoplastic radiation therapy: Secondary | ICD-10-CM | POA: Diagnosis not present

## 2021-02-08 MED ORDER — ARIPIPRAZOLE 5 MG PO TABS
5.0000 mg | ORAL_TABLET | Freq: Every day | ORAL | Status: DC
  Administered 2021-02-08 – 2021-02-17 (×10): 5 mg via ORAL
  Filled 2021-02-08 (×10): qty 1

## 2021-02-08 MED ORDER — HYDROXYZINE HCL 50 MG PO TABS
50.0000 mg | ORAL_TABLET | Freq: Three times a day (TID) | ORAL | Status: DC | PRN
  Administered 2021-02-08 – 2021-02-17 (×9): 50 mg via ORAL
  Filled 2021-02-08 (×9): qty 1

## 2021-02-08 NOTE — [facility] Suicide Risk Assessment (Addendum)
[place] INPATIENT:  Family/Significant Other Suicide Prevention Education  Suicide Prevention Education:  Contact Attempts: Ray Pratte, (Brother) has been identified by the patient as the family member/significant other with whom the patient will be residing, and identified as the person(s) who will aid the patient in the event of a mental health crisis.  With written consent from the patient, two attempts were made to provide suicide prevention education, prior to and/or following the patient's discharge.  We were unsuccessful in providing suicide prevention education.  A suicide education pamphlet was given to the patient to share with family/significant other.  Date and time of first attempt: 02/05/21  Date and time of second attempt: CSW will make additional attempts to reach collateral contact.   Durenda Hurt 02/08/2021, 9:00 AM

## 2021-02-08 NOTE — Progress Notes (Signed)
[facility] MD Progress Note  02/08/2021 1:31 PM Paul Hebert  MRN:  [id number]   CC "I can't get in anywhere"  Subjective:  50 year old male presenting for worsening mood and suicidal ideations. No acute events overnight, medication compliant, attending to ADLs.  Patient seen one-on-one today. He notes frequent night time awakenings last night as well. He feels he was sleeping better prior to using trazodone, and requests sleeping without it tonight. He also continues to endorse a lot of depression, hopelessness, and anxiety. Discussed RBA of adding Abilify to current regimen, and he is in agreement. He is in the process of completing radiation. He has a session yesterday, and has 7 remaining. He has made numerous calls to different detox and rehab facilities, but none have been willing to accept him due to need for continued radiation treatments. He denies any SI/HI/AH/VH.   Principal Problem: Severe recurrent major depression without psychotic features ([place]) Diagnosis: Principal Problem:   Severe recurrent major depression without psychotic features ([place]) Active Problems:   Squamous cell carcinoma of oropharynx (HCC)   Cocaine abuse ([place])   Alcohol abuse  Total Time spent with patient: 30 minutes  Past Psychiatric History: See H&P  Past Medical History:  Past Medical History:  Diagnosis Date  . 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 PORT-A-CATH REMOVAL N/A 02/04/2021   Procedure: REMOVAL PORT-A-CATH;  Surgeon: Olean Ree, MD;  Location: [facility] ORS;  Service: General;  Laterality: N/A;  . PORTA CATH INSERTION N/A 10/25/2020   Procedure: PORTA CATH INSERTION;  Surgeon: Algernon Huxley, MD;  Location: [place] CV LAB;  Service: Cardiovascular;  Laterality: N/A;  . REMOVAL OF GASTROSTOMY TUBE N/A 02/04/2021   Procedure: REMOVAL OF GASTROSTOMY TUBE;  Surgeon: Olean Ree, MD;  Location: [facility] ORS;  Service: General;  Laterality: N/A;   Family  History:  Family History  Problem Relation Age of Onset  . Cancer Father    Family Psychiatric  History: See H&P Social History:  Social History   Substance and Sexual Activity  Alcohol Use Yes   Comment: 12 beers a day     Social History   Substance and Sexual Activity  Drug Use Yes  . Types: Marijuana, "Crack" cocaine    Social History   Socioeconomic History  . Marital status: Single    Spouse name: Not on file  . Number of children: Not on file  . Years of education: Not on file  . Highest education level: Not on file  Occupational History  . Not on file  Tobacco Use  . Smoking status: Current Every Day Smoker    Packs/day: 0.50  . Smokeless tobacco: Never Used  Vaping Use  . Vaping Use: Never used  Substance and Sexual Activity  . Alcohol use: Yes    Comment: 12 beers a day  . Drug use: Yes    Types: Marijuana, "Crack" cocaine  . Sexual activity: Not Currently  Other Topics Concern  . Not on file  Social History Narrative  . Not on file   Social Determinants of Health   Financial Resource Strain: Not on file  Food Insecurity: Not on file  Transportation Needs: Not on file  Physical Activity: Not on file  Stress: Not on file  Social Connections: Not on file   Additional Social History:    Sleep: Poor  Appetite:  Good  Current Medications: Current Facility-Administered Medications  Medication Dose Route Frequency Provider Last Rate  Last Admin  . oxyCODONE (Oxy IR/ROXICODONE) immediate release tablet 5 mg  5 mg Oral Q6H PRN Lu Duffel, RPH   5 mg at 02/07/21 2200   And  . acetaminophen (TYLENOL) tablet 325 mg  325 mg Oral Q6H PRN Shanlever, Pierce Crane, RPH      . acetaminophen (TYLENOL) tablet 650 mg  650 mg Oral Q6H PRN Clapacs, John T, MD      . alum & mag hydroxide-simeth (MAALOX/MYLANTA) 200-200-20 MG/5ML suspension 30 mL  30 mL Oral Q4H PRN Clapacs, John T, MD      . ARIPiprazole (ABILIFY) tablet 5 mg  5 mg Oral Daily Salley Scarlet, MD   5 mg at 02/08/21 1117  . escitalopram (LEXAPRO) tablet 10 mg  10 mg Oral Daily Clapacs, Madie Reno, MD   10 mg at 02/08/21 0811  . feeding supplement (ENSURE ENLIVE / ENSURE PLUS) liquid 237 mL  237 mL Oral TID BM Clapacs, John T, MD   237 mL at 02/08/21 1008  . gabapentin (NEURONTIN) capsule 300 mg  300 mg Oral TID Clapacs, John T, MD   300 mg at 02/08/21 1117  . hydrOXYzine (ATARAX/VISTARIL) tablet 50 mg  50 mg Oral TID PRN Salley Scarlet, MD      . magnesium hydroxide (MILK OF MAGNESIA) suspension 30 mL  30 mL Oral Daily PRN Clapacs, John T, MD      . multivitamin with minerals tablet 1 tablet  1 tablet Oral Daily Clapacs, Madie Reno, MD   1 tablet at 02/08/21 2725  . neomycin-bacitracin-polymyxin (NEOSPORIN) ointment   Topical Daily Clapacs, Madie Reno, MD   1 application at 36/64/40 [phone]  . nicotine (NICODERM CQ - dosed in mg/24 hours) patch 14 mg  14 mg Transdermal Daily Clapacs, Madie Reno, MD   14 mg at 02/08/21 0811  . pantoprazole (PROTONIX) EC tablet 20 mg  20 mg Oral Daily Clapacs, Madie Reno, MD   20 mg at 02/08/21 2595    Lab Results: No results found for this or any previous visit (from the past 48 hour(s)).  Blood Alcohol level:  Lab Results  Component Value Date   ETH <10 01/28/2021   ETH 222 (H) 63/87/5643    Metabolic Disorder Labs: No results found for: HGBA1C, MPG No results found for: PROLACTIN No results found for: CHOL, TRIG, HDL, CHOLHDL, VLDL, LDLCALC  Physical Findings: AIMS:  , ,  ,  ,    CIWA:    COWS:     Musculoskeletal: Strength & Muscle Tone: within normal limits Gait & Station: normal Patient leans: N/A  Psychiatric Specialty Exam:  Presentation  General Appearance: Casual  Eye Contact:Good  Speech:Normal Rate  Speech Volume:Normal  Handedness:Right   Mood and Affect  Mood:Anxious; Depressed  Affect:Congruent   Thought Process  Thought Processes:Goal Directed  Descriptions of Associations:Intact  Orientation:Full (Time, Place and  Person)  Thought Content:Logical  History of Schizophrenia/Schizoaffective disorder:No  Duration of Psychotic Symptoms:No data recorded Hallucinations:Hallucinations: Other (comment)  Ideas of Reference:None  Suicidal Thoughts:Suicidal Thoughts: No  Homicidal Thoughts:Homicidal Thoughts: No   Sensorium  Memory:Immediate Good; Recent Good; Remote Good  Judgment:Intact  Insight:Fair   Executive Functions  Concentration:Good  Attention Span:Good  [place] of Knowledge:Good  Language:Good   Psychomotor Activity  Psychomotor Activity:Psychomotor Activity: Normal   Assets  Assets:Communication Skills; Desire for Improvement; Financial Resources/Insurance; Social Support; Talents/Skills   Sleep  Sleep:Sleep: -- (Poor. Patient notes he can fall asleep quickly, but has multiple  night time awakening and early morning awakening) Number of Hours of Sleep: 6.45    Physical Exam: Physical Exam  ROS  Blood pressure (!) 146/86, pulse 84, temperature 98.3 F (36.8 C), temperature source Oral, resp. rate 17, height 5\' 6"  (1.676 m), weight 60.8 kg, SpO2 100 %. Body mass index is 21.63 kg/m.   Treatment Plan Summary: Daily contact with patient to assess and evaluate symptoms and progress in treatment and Medication management   MDD, recurrent, severe, without psychotic features - Start Abilify 5 mg daily, continue Lexapro 10 mg QHS  Alcohol use Disorder, Cocaine Use Disorder - Cessation counseling, desires residential treatment up on discharge  Squamous Cell Carcinoma of Oropharynx - Resume radiation treatments today - Oxycodone 5 mg Q6hr PRN - Gabapentin 300 mg TID   Removal of port-a-cath and and gastrostomy tube - Sites healing well, neosporin to area   Salley Scarlet, MD 02/08/2021, 1:31 PM

## 2021-02-08 NOTE — Plan of Care (Signed)
Patient compliant with procedures on the unit.   Problem: Education: Goal: Knowledge of the prescribed therapeutic regimen will improve Outcome: Progressing   

## 2021-02-08 NOTE — Progress Notes (Signed)
Recreation Therapy Notes   Date: 02/08/2021  Time: 2:00pm  Location: Courtyard   Behavioral response: Appropriate  Group Type: Leisure  Participation level: Active  Communication: Patient was social with peers and staff.  Comments: N/A  Konstantin Lehnen LRT/CTRS        Seirra Kos 02/08/2021 2:47 PM

## 2021-02-08 NOTE — Plan of Care (Signed)
Patient during assessments this morning endorsed an euthymic to sad mood, stating "It's going alright", and Pt. affect was congruent to mood. Pt. Denied si/hi/avh and endorsed ability to continue to remain safe on the unit. Pt. Orientation appeared grossly intact. Pt. Denied physical pain and endorsed toleration of medications thus far. Pt. Encouraged to utilize pain medications if needed to stay ahead of his pain. Pt. Endorsed mild problems with sleep over the night, but eating at this time remains normal. Pt. Expressed he is feeling less hopeless/helpless today, but still is upset about his significant other leaving him, stating he feels "betrayed".   Patient has been complaint with medications and unit procedures thus far. Pt. Has been observed eating good thus far. Observed up this morning for breakfast. Pt. Has been able to remain safe on the unit thus far. Pt. Thus far has been observed up in the milieu and or attending groups. Pt. Has not been a behavioral concern.    Q x 15 minute observation checks in place/maintained for safety. Patient is provided with education throughout shift when appropriate and able.  Patient is given/offered medications per orders. Patient is encouraged to attend groups, participate in unit activities and continue with plan of care. Pt. Chart and plans of care reviewed. Pt. Given support and encouragement when appropriate and able.     Problem: Activity: Goal: Interest or engagement in leisure activities will improve Outcome: Progressing Note: Pt. Attends groups.    Problem: Health Behavior/Discharge Planning: Goal: Compliance with therapeutic regimen will improve Outcome: Progressing Note: Pt. Has been complaint with medications and unit procedures.    Problem: Safety: Goal: Ability to disclose and discuss suicidal ideas will improve Outcome: Progressing Note: Pt. Denies si/hi/avh and endorsed ability to continue to remain safe on the unit.    Problem:  Self-Concept: Goal: Will verbalize positive feelings about self Outcome: Progressing Note: Pt. Expressed less hopelessness and helplessness today.

## 2021-02-08 NOTE — Progress Notes (Signed)
Patient escorted back to the [facility] by staff from radiology.

## 2021-02-08 NOTE — Progress Notes (Signed)
Recreation Therapy Notes  Date: 02/08/2021  Time: 09:30 am   Location: Craft room   Behavioral response: Appropriate  Intervention Topic: Goals    Discussion/Intervention:  Group content on today was focused on goals. Patients described what goals are and how they define goals. Individuals expressed how they go about setting goals and reaching them. The group identified how important goals are and if they make short term goals to reach long term goals. Patients described how many goals they work on at a time and what affects them not reaching their goal. Individuals described how much time they put into planning and obtaining their goals. The group participated in the intervention "My Goal Board" and made personal goal boards to help them achieve their goal. Clinical Observations/Feedback: Patient came to group late and was focused on the topic. He stated that his goal is to stay clean and sober and get back on his feet. Individual was social with staff and peers while participating in the intervention. Shakthi Scipio LRT/CTRS          Paul Hebert 02/08/2021 10:55 AM

## 2021-02-08 NOTE — Progress Notes (Signed)
Recreation Therapy Notes  INPATIENT RECREATION THERAPY ASSESSMENT  Patient Details Name: Paul Hebert MRN: [id number] DOB: Jan 13, 1971 Today's Date: 02/08/2021       Information Obtained From: Patient  Able to Participate in Assessment/Interview: Yes  Patient Presentation: Responsive  Reason for Admission (Per Patient): Active Symptoms,Substance Abuse  Patient Stressors: Relationship  Coping Skills:   Substance Abuse  Leisure Interests (2+):  Social - Family  Frequency of Recreation/Participation: Monthly  Awareness of Community Resources:  No  Community Resources:     Current Use:    If no, Barriers?:    Expressed Interest in [name] Information: No  County of Residence:  [place]  Patient Main Form of Transportation: Car  Patient Strengths:  N/A  Patient Identified Areas of Improvement:  Getting back on my feet  Patient Goal for Hospitalization:  To stay clean  Current SI (including self-harm):  No  Current HI:  No  Current AVH: No  Staff Intervention Plan: Group Attendance,Collaborate with Interdisciplinary Treatment Team  Consent to Intern Participation: N/A  Paul Hebert 02/08/2021, 11:12 AM

## 2021-02-08 NOTE — BHH Suicide Risk Assessment (Signed)
[place] INPATIENT:  Family/Significant Other Suicide Prevention Education  Suicide Prevention Education:  Contact Attempts: Ray Soto, (Brother) has been identified by the patient as the family member/significant other with whom the patient will be residing, and identified as the person(s) who will aid the patient in the event of a mental health crisis.  With written consent from the patient, two attempts were made to provide suicide prevention education, prior to and/or following the patient's discharge.  We were unsuccessful in providing suicide prevention education.  A suicide education pamphlet was given to the patient to share with family/significant other.  Date and time of first attempt: 02/05/21  Date and time of second attempt: 02/08/21  SPE completed with patient. Voice mails left with contact information and callback request.   Durenda Hurt 02/08/2021, 9:41 AM

## 2021-02-08 NOTE — Progress Notes (Signed)
Patient calm and cooperative during assessment denying SI/HI/AVH. Patient observed interacting appropriately with staff and peers on the unit. Patient compliant with medication administration per MD orders. Pt given education, support, and encouragement to be active in his treatment plan. Patient being monitored Q 15 minutes for safety per unit protocol. Pt remains safe on the unit.

## 2021-02-08 NOTE — BHH Counselor (Signed)
Patient provided with additional resources for substance use treatment and shelter ([facility], [facility], [facility], and [facility]). Patient expressed anxiety and frustration around finding substance use treatment with cancer dx. CSW will continue to follow.     Signed:  Durenda Hurt, MSW, [place], LCASA 02/08/2021 11:10 AM

## 2021-02-08 NOTE — Progress Notes (Signed)
Recreation Therapy Notes  INPATIENT RECREATION TR PLAN  Patient Details Name: Paul Hebert MRN: [id number] DOB: [birth year] Today's Date: 02/08/2021  Rec Therapy Plan Is patient appropriate for Therapeutic Recreation?: Yes Treatment times per week: at least 3 Estimated Length of Stay: 5-7 days TR Treatment/Interventions: Group participation (Comment)  Discharge Criteria Pt will be discharged from therapy if:: Discharged Treatment plan/goals/alternatives discussed and agreed upon by:: Patient/family  Discharge Summary     Kayla Weekes 02/08/2021, 11:13 AM

## 2021-02-09 ENCOUNTER — Ambulatory Visit: Payer: Medicaid Other

## 2021-02-09 DIAGNOSIS — Z510 Encounter for antineoplastic radiation therapy: Secondary | ICD-10-CM | POA: Diagnosis not present

## 2021-02-09 MED ORDER — HYDROXYZINE HCL 50 MG PO TABS
50.0000 mg | ORAL_TABLET | Freq: Every day | ORAL | Status: DC
  Administered 2021-02-09 – 2021-02-16 (×8): 50 mg via ORAL
  Filled 2021-02-09 (×8): qty 1

## 2021-02-09 NOTE — Progress Notes (Signed)
[facility] MD Progress Note  02/09/2021 1:26 PM Paul Hebert  MRN:  [id number]   CC "I couldn't stay asleep."  Subjective:  50 year old male presenting for worsening mood and suicidal ideations. No acute events overnight, medication compliant, attending to ADLs.  Patient seen one-on-one today. He notes he had a hard time sleeping last night as well. He was awake numerous times in the night. At times, due to need to urinate, and others due to some anxiety. He endorses some throat pain and mouth dryness secondary to radiation treatment. He continues to have some depression and anxiety surrounding his lack of after care at this time. He is hopeful to be accepted into residential treatment after he finishes radiation treatments. He denies SI/HI/AH/VH.    Principal Problem: Severe recurrent major depression without psychotic features ([place]) Diagnosis: Principal Problem:   Severe recurrent major depression without psychotic features ([place]) Active Problems:   Squamous cell carcinoma of oropharynx (HCC)   Cocaine abuse ([place])   Alcohol abuse  Total Time spent with patient: 30 minutes  Past Psychiatric History: See H&P  Past Medical History:  Past Medical History:  Diagnosis Date  . 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 PORT-A-CATH REMOVAL N/A 02/04/2021   Procedure: REMOVAL PORT-A-CATH;  Surgeon: Olean Ree, MD;  Location: [facility] ORS;  Service: General;  Laterality: N/A;  . PORTA CATH INSERTION N/A 10/25/2020   Procedure: PORTA CATH INSERTION;  Surgeon: Algernon Huxley, MD;  Location: [place] CV LAB;  Service: Cardiovascular;  Laterality: N/A;  . REMOVAL OF GASTROSTOMY TUBE N/A 02/04/2021   Procedure: REMOVAL OF GASTROSTOMY TUBE;  Surgeon: Olean Ree, MD;  Location: [facility] ORS;  Service: General;  Laterality: N/A;   Family History:  Family History  Problem Relation Age of Onset  . Cancer Father    Family Psychiatric  History: See H&P Social History:   Social History   Substance and Sexual Activity  Alcohol Use Yes   Comment: 12 beers a day     Social History   Substance and Sexual Activity  Drug Use Yes  . Types: Marijuana, "Crack" cocaine    Social History   Socioeconomic History  . Marital status: Single    Spouse name: Not on file  . Number of children: Not on file  . Years of education: Not on file  . Highest education level: Not on file  Occupational History  . Not on file  Tobacco Use  . Smoking status: Current Every Day Smoker    Packs/day: 0.50  . Smokeless tobacco: Never Used  Vaping Use  . Vaping Use: Never used  Substance and Sexual Activity  . Alcohol use: Yes    Comment: 12 beers a day  . Drug use: Yes    Types: Marijuana, "Crack" cocaine  . Sexual activity: Not Currently  Other Topics Concern  . Not on file  Social History Narrative  . Not on file   Social Determinants of Health   Financial Resource Strain: Not on file  Food Insecurity: Not on file  Transportation Needs: Not on file  Physical Activity: Not on file  Stress: Not on file  Social Connections: Not on file   Additional Social History:    Sleep: Poor  Appetite:  Good  Current Medications: Current Facility-Administered Medications  Medication Dose Route Frequency Provider Last Rate Last Admin  . oxyCODONE (Oxy IR/ROXICODONE) immediate release tablet 5 mg  5 mg Oral Q6H PRN Shanlever,  Pierce Crane, RPH   5 mg at 02/07/21 2200   And  . acetaminophen (TYLENOL) tablet 325 mg  325 mg Oral Q6H PRN Shanlever, Pierce Crane, RPH      . acetaminophen (TYLENOL) tablet 650 mg  650 mg Oral Q6H PRN Clapacs, John T, MD      . alum & mag hydroxide-simeth (MAALOX/MYLANTA) 200-200-20 MG/5ML suspension 30 mL  30 mL Oral Q4H PRN Clapacs, John T, MD      . ARIPiprazole (ABILIFY) tablet 5 mg  5 mg Oral Daily Salley Scarlet, MD   5 mg at 02/09/21 0810  . escitalopram (LEXAPRO) tablet 10 mg  10 mg Oral Daily Clapacs, Madie Reno, MD   10 mg at 02/09/21 0810   . feeding supplement (ENSURE ENLIVE / ENSURE PLUS) liquid 237 mL  237 mL Oral TID BM Clapacs, John T, MD   237 mL at 02/09/21 1017  . gabapentin (NEURONTIN) capsule 300 mg  300 mg Oral TID Clapacs, John T, MD   300 mg at 02/09/21 1150  . hydrOXYzine (ATARAX/VISTARIL) tablet 50 mg  50 mg Oral TID PRN Salley Scarlet, MD   50 mg at 02/08/21 2116  . hydrOXYzine (ATARAX/VISTARIL) tablet 50 mg  50 mg Oral QHS Salley Scarlet, MD      . magnesium hydroxide (MILK OF MAGNESIA) suspension 30 mL  30 mL Oral Daily PRN Clapacs, Madie Reno, MD      . multivitamin with minerals tablet 1 tablet  1 tablet Oral Daily Clapacs, Madie Reno, MD   1 tablet at 02/09/21 0810  . neomycin-bacitracin-polymyxin (NEOSPORIN) ointment   Topical Daily Clapacs, Madie Reno, MD   1 application at 62/70/35 [phone]  . nicotine (NICODERM CQ - dosed in mg/24 hours) patch 14 mg  14 mg Transdermal Daily Clapacs, Madie Reno, MD   14 mg at 02/09/21 0811  . pantoprazole (PROTONIX) EC tablet 20 mg  20 mg Oral Daily Clapacs, Madie Reno, MD   20 mg at 02/09/21 8182    Lab Results: No results found for this or any previous visit (from the past 48 hour(s)).  Blood Alcohol level:  Lab Results  Component Value Date   ETH <10 01/28/2021   ETH 222 (H) 99/37/1696    Metabolic Disorder Labs: No results found for: HGBA1C, MPG No results found for: PROLACTIN No results found for: CHOL, TRIG, HDL, CHOLHDL, VLDL, LDLCALC  Physical Findings: AIMS:  , ,  ,  ,    CIWA:    COWS:     Musculoskeletal: Strength & Muscle Tone: within normal limits Gait & Station: normal Patient leans: N/A  Psychiatric Specialty Exam:  Presentation  General Appearance: Casual  Eye Contact:Good  Speech:Normal Rate  Speech Volume:Normal  Handedness:Right   Mood and Affect  Mood:Anxious; Depressed  Affect:Congruent   Thought Process  Thought Processes:Goal Directed  Descriptions of Associations:Intact  Orientation:Full (Time, Place and Person)  Thought  Content:Logical  History of Schizophrenia/Schizoaffective disorder:No  Duration of Psychotic Symptoms:No data recorded Hallucinations:Denies Ideas of Reference:None  Suicidal Thoughts:Denies Homicidal Thoughts:Denies  Sensorium  Memory:Immediate Good; Recent Good; Remote Good  Judgment:Intact  Insight:Fair   Executive Functions  Concentration:Good  Attention Span:Good  [place] of Knowledge:Good  Language:Good   Psychomotor Activity  Psychomotor Activity:Normal  Assets  Assets:Communication Skills; Desire for Improvement; Financial Resources/Insurance; Social Support; Talents/Skills   Sleep  Sleep:Sleep: -- (Poor. Patient notes he can fall asleep quickly, but has multiple night time awakening and early morning awakening) Number  of Hours of Sleep: 9    Physical Exam: Physical Exam  ROS  Blood pressure (!) 141/91, pulse 83, temperature 98.5 F (36.9 C), temperature source Oral, resp. rate 17, height 5\' 6"  (1.676 m), weight 60.8 kg, SpO2 99 %. Body mass index is 21.63 kg/m.   Treatment Plan Summary: Daily contact with patient to assess and evaluate symptoms and progress in treatment and Medication management   MDD, recurrent, severe, without psychotic features - Continue Abilify 5 mg daily, continue Lexapro 10 mg QHS  Alcohol use Disorder, Cocaine Use Disorder - Cessation counseling, desires residential treatment up on discharge  Squamous Cell Carcinoma of Oropharynx - Continue radiation treatments, due to finish Weds, May 4th  - Oxycodone 5 mg Q6hr PRN - Gabapentin 300 mg TID  Removal of port-a-cath and and gastrostomy tube - Sites healing well, neosporin to area   Salley Scarlet, MD 02/09/2021, 1:26 PM

## 2021-02-09 NOTE — Progress Notes (Signed)
Recreation Therapy Notes   Date: 02/09/2021  Time: 10:00 am   Location: Craft room     Behavioral response: N/A   Intervention Topic: Happiness   Discussion/Intervention: Patient did not attend group.   Clinical Observations/Feedback:  Patient did not attend group.   Burna Atlas LRT/CTRS        Darnita Woodrum 02/09/2021 11:42 AM

## 2021-02-09 NOTE — Progress Notes (Signed)
Patient is calm and cooperative with assessment. He denies SI, HI, and AVH. Patient denies pain from surgical sites today. He rates his depression as a 1/10 and anxiety as a 4/10. He complains of poor sleep, saying that he can only sleep for 30-45 minutes at a time. He did not take medications for sleep because he states he does not want to feel groggy, and that trazodone does not help him stay asleep. Patient says that his appetite was good before beginning radiation treatment, but his throat is irritated/sore after beginning treatments, so appetite has not been as good. Patient was educated on current medications and remains medication compliant. Support and encouragement provided. Patient remains safe on the unit at this time and q15 min safety checks are maintained.

## 2021-02-09 NOTE — B[facility] Group Notes (Signed)
LCSW Group Therapy Note  02/09/2021 2:18 PM  Type of Therapy/Topic:  Group Therapy:  Emotion Regulation  Participation Level:  Active   Description of Group:   The purpose of this group is to assist patients in learning to regulate negative emotions and experience positive emotions. Patients will be guided to discuss ways in which they have been vulnerable to their negative emotions. These vulnerabilities will be juxtaposed with experiences of positive emotions or situations, and patients will be challenged to use positive emotions to combat negative ones. Special emphasis will be placed on coping with negative emotions in conflict situations, and patients will process healthy conflict resolution skills.  Therapeutic Goals: 1. Patient will identify two positive emotions or experiences to reflect on in order to balance out negative emotions 2. Patient will label two or more emotions that they find the most difficult to experience 3. Patient will demonstrate positive conflict resolution skills through discussion and/or role plays  Summary of Patient Progress: Patient was present for the entirety of group. He was actively involved in the discussion and his comments were pertinent to the topic at hand. Pt was able to identify his emotions and talk about dealing with strong emotions.   Therapeutic Modalities:   Cognitive Behavioral Therapy Feelings Identification Dialectical Behavioral Therapy  Paul Hebert. Paul Hebert, MSW, LCSW, [place] 02/09/2021 2:18 PM

## 2021-02-10 ENCOUNTER — Ambulatory Visit: Payer: Medicaid Other

## 2021-02-10 DIAGNOSIS — Z510 Encounter for antineoplastic radiation therapy: Secondary | ICD-10-CM | POA: Diagnosis not present

## 2021-02-10 NOTE — Progress Notes (Signed)
BRIEF PHARMACY NOTE   This patient attended and participated in Medication Management Group counseling led by [facility] staff pharmacist.  This interactive class reviews basic information about prescription medications and education on personal responsibility in medication management.  The class also includes general knowledge of 3 main classes of behavioral medications, including antipsychotics, antidepressants, and mood stabilizers.     Patient behavior was appropriate for group setting.   Educational materials sourced from:  "Medication Do's and Don'ts" from [email or web address]   "Mental Health Medications" from [email or web address], PharmD, BCPS Clinical Pharmacist 02/10/2021 3:24 PM

## 2021-02-10 NOTE — Progress Notes (Signed)
Patient presented in a pleasant mood. Med compliant and tolerated his meds without incident. He denied SI  HI  AVH depression at this encounter. He continues to endorse some anxiety and minor pain. His main complaint this evening is his inability to sleep. He is hoping that the med changes that the doctor made today will result in a good nights sleep. He was encouraged to come back to nurses station later if he found himself unable to sleep. For now he remains safe with 15 minute safety checks.     Cleo Butler-Nicholson, LPN

## 2021-02-10 NOTE — [facility] Group Notes (Signed)
LCSW Group Therapy Note     02/10/2021 2:27 PM     Type of Therapy/Topic:  Group Therapy:  Balance in Life     Participation Level:  Active     Description of Group:    This group will address the concept of balance and how it feels and looks when one is unbalanced. Patients will be encouraged to process areas in their lives that are out of balance and identify reasons for remaining unbalanced. Facilitators will guide patients in utilizing problem-solving interventions to address and correct the stressor making their life unbalanced. Understanding and applying boundaries will be explored and addressed for obtaining and maintaining a balanced life. Patients will be encouraged to explore ways to assertively make their unbalanced needs known to significant others in their lives, using other group members and facilitator for support and feedback.     Therapeutic Goals:  1.      Patient will identify two or more emotions or situations they have that consume much of in their lives.  2.      Patient will identify signs/triggers that life has become out of balance:  3.      Patient will identify two ways to set boundaries in order to achieve balance in their lives:  4.      Patient will demonstrate ability to communicate their needs through discussion and/or role plays     Summary of Patient Progress: Patient was present for most of the group session. Patient was an active listener and participated in the topic of discussion, provided helpful advice to others, and added nuance to topic of conversation. Patient discussed that his two siblings "were rocks" and they keep him focused on his goals. He stated that they "tell it to him straight" and that it helps him set boundaries with friends that still use substances problematically. Patient discussed that spending time in nature helps him cope with his emotions and that each day is getting better.        Therapeutic Modalities:    Cognitive Behavioral Therapy  Solution-Focused Therapy  Assertiveness Training     Mat Stuard [place] MSW, [place]  02/10/2021 2:27 PM

## 2021-02-10 NOTE — Progress Notes (Signed)
Recreation Therapy Notes  Date: 02/10/2021   Time: 10:00 am   Location: Craft room     Behavioral response: N/A   Intervention Topic: Strengths    Discussion/Intervention: Patient did not attend group.   Clinical Observations/Feedback:  Patient did not attend group.   Zaydon Kinser LRT/CTRS        Railynn Ballo 02/10/2021 10:42 AM

## 2021-02-10 NOTE — Progress Notes (Signed)
Pt is alert and oriented to person, place, time, and situation. Pt is calm, cooperative, pleasant, social with peers and staff. Pt denies suicidal and homicidal ideation, denies hallucinations, denies feelings of anxiety and depression, reports he feels increasingly positive. Pt is medication compliant, and focused on discharge. Will continue to monitor pt per Q15 minute face checks and monitor for safety and progress.

## 2021-02-10 NOTE — Plan of Care (Signed)
  Problem: Education: Goal: Utilization of techniques to improve thought processes will improve Outcome: Progressing Goal: Knowledge of the prescribed therapeutic regimen will improve Outcome: Progressing   Problem: Activity: Goal: Interest or engagement in leisure activities will improve Outcome: Progressing Goal: Imbalance in normal sleep/wake cycle will improve Outcome: Progressing   Problem: Coping: Goal: Coping ability will improve Outcome: Progressing Goal: Will verbalize feelings Outcome: Progressing   Problem: Health Behavior/Discharge Planning: Goal: Ability to make decisions will improve Outcome: Progressing Goal: Compliance with therapeutic regimen will improve Outcome: Progressing   Problem: Safety: Goal: Ability to disclose and discuss suicidal ideas will improve Outcome: Progressing Goal: Ability to identify and utilize support systems that promote safety will improve Outcome: Progressing   Problem: Self-Concept: Goal: Will verbalize positive feelings about self Outcome: Progressing Goal: Level of anxiety will decrease Outcome: Progressing

## 2021-02-10 NOTE — [facility] Suicide Risk Assessment (Addendum)
[place] INPATIENT:  Family/Significant Other Suicide Prevention Education  Suicide Prevention Education:  Education Completed; CSW received callback from Fannie Alomar, [phone] [facility]) has been identified by the patient as the family member/significant other with whom the patient will be residing, and identified as the person(s) who will aid the patient in the event of a mental health crisis (suicidal ideations/suicide attempt).  With written consent from the patient, the family member/significant other has been provided the following suicide prevention education, prior to the and/or following the discharge of the patient.  The suicide prevention education provided includes the following:  Suicide risk factors  Suicide prevention and interventions  National Suicide Hotline telephone number  [facility] assessment telephone number  [facility] Emergency Assistance [place] and/or Residential Mobile Crisis Unit telephone number  Request made of family/significant other to:  Remove weapons (e.g., guns, rifles, knives), all items previously/currently identified as safety concern.    Remove drugs/medications (over-the-counter, prescriptions, illicit drugs), all items previously/currently identified as a safety concern.  The family member/significant other verbalizes understanding of the suicide prevention education information provided.  The family member/significant other agrees to remove the items of safety concern listed above.  Durenda Hurt 02/10/2021, 1:07 PM

## 2021-02-10 NOTE — Progress Notes (Signed)
[facility] MD Progress Note  02/10/2021 1:52 PM Paul Hebert  MRN:  [id number]   CC "Still up a lot last night."  Subjective:  50 year old male presenting for worsening mood and suicidal ideations. No acute events overnight, medication compliant, attending to ADLs.  Patient seen one-on-one today. He notes he still had a little trouble staying asleep last night, but felt it was improved from night before. He notes a lot of his awakening are due to needing to urinate. He has been ingesting a lot of water to ease the pain in his throat secondary to radiation. He denies any SI/HI/AH/VH. He feels his depression has improved overall. He continues to express desire to go to residential substance abuse treatment. However, none are approving him for admission due to need to complete radiation. This is set to complete next Weds.    Principal Problem: Severe recurrent major depression without psychotic features ([place]) Diagnosis: Principal Problem:   Severe recurrent major depression without psychotic features ([place]) Active Problems:   Squamous cell carcinoma of oropharynx (HCC)   Cocaine abuse ([place])   Alcohol abuse  Total Time spent with patient: 30 minutes  Past Psychiatric History: See H&P  Past Medical History:  Past Medical History:  Diagnosis Date  . 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 PORT-A-CATH REMOVAL N/A 02/04/2021   Procedure: REMOVAL PORT-A-CATH;  Surgeon: Olean Ree, MD;  Location: [facility] ORS;  Service: General;  Laterality: N/A;  . PORTA CATH INSERTION N/A 10/25/2020   Procedure: PORTA CATH INSERTION;  Surgeon: Algernon Huxley, MD;  Location: [facility] CV LAB;  Service: Cardiovascular;  Laterality: N/A;  . REMOVAL OF GASTROSTOMY TUBE N/A 02/04/2021   Procedure: REMOVAL OF GASTROSTOMY TUBE;  Surgeon: Olean Ree, MD;  Location: [facility] ORS;  Service: General;  Laterality: N/A;   Family History:  Family History  Problem Relation Age of Onset  .  Cancer Father    Family Psychiatric  History: See H&P Social History:  Social History   Substance and Sexual Activity  Alcohol Use Yes   Comment: 12 beers a day     Social History   Substance and Sexual Activity  Drug Use Yes  . Types: Marijuana, "Crack" cocaine    Social History   Socioeconomic History  . Marital status: Single    Spouse name: Not on file  . Number of children: Not on file  . Years of education: Not on file  . Highest education level: Not on file  Occupational History  . Not on file  Tobacco Use  . Smoking status: Current Every Day Smoker    Packs/day: 0.50  . Smokeless tobacco: Never Used  Vaping Use  . Vaping Use: Never used  Substance and Sexual Activity  . Alcohol use: Yes    Comment: 12 beers a day  . Drug use: Yes    Types: Marijuana, "Crack" cocaine  . Sexual activity: Not Currently  Other Topics Concern  . Not on file  Social History Narrative  . Not on file   Social Determinants of Health   Financial Resource Strain: Not on file  Food Insecurity: Not on file  Transportation Needs: Not on file  Physical Activity: Not on file  Stress: Not on file  Social Connections: Not on file   Additional Social History:    Sleep: Poor  Appetite:  Good  Current Medications: Current Facility-Administered Medications  Medication Dose Route Frequency Provider Last Rate Last Admin  .  oxyCODONE (Oxy IR/ROXICODONE) immediate release tablet 5 mg  5 mg Oral Q6H PRN Lu Duffel, RPH   5 mg at 02/07/21 2200   And  . acetaminophen (TYLENOL) tablet 325 mg  325 mg Oral Q6H PRN Shanlever, Pierce Crane, RPH      . acetaminophen (TYLENOL) tablet 650 mg  650 mg Oral Q6H PRN Clapacs, Madie Reno, MD   650 mg at 02/09/21 2121  . alum & mag hydroxide-simeth (MAALOX/MYLANTA) 200-200-20 MG/5ML suspension 30 mL  30 mL Oral Q4H PRN Clapacs, John T, MD      . ARIPiprazole (ABILIFY) tablet 5 mg  5 mg Oral Daily Salley Scarlet, MD   5 mg at 02/10/21 0757  .  escitalopram (LEXAPRO) tablet 10 mg  10 mg Oral Daily Clapacs, Madie Reno, MD   10 mg at 02/10/21 0757  . feeding supplement (ENSURE ENLIVE / ENSURE PLUS) liquid 237 mL  237 mL Oral TID BM Clapacs, John T, MD   237 mL at 02/09/21 2030  . gabapentin (NEURONTIN) capsule 300 mg  300 mg Oral TID Clapacs, John T, MD   300 mg at 02/10/21 0757  . hydrOXYzine (ATARAX/VISTARIL) tablet 50 mg  50 mg Oral TID PRN Salley Scarlet, MD   50 mg at 02/09/21 2122  . hydrOXYzine (ATARAX/VISTARIL) tablet 50 mg  50 mg Oral QHS Salley Scarlet, MD   50 mg at 02/09/21 2122  . magnesium hydroxide (MILK OF MAGNESIA) suspension 30 mL  30 mL Oral Daily PRN Clapacs, John T, MD      . multivitamin with minerals tablet 1 tablet  1 tablet Oral Daily Clapacs, Madie Reno, MD   1 tablet at 02/10/21 0757  . neomycin-bacitracin-polymyxin (NEOSPORIN) ointment   Topical Daily Clapacs, Madie Reno, MD   1 application at 66/06/30 0758  . nicotine (NICODERM CQ - dosed in mg/24 hours) patch 14 mg  14 mg Transdermal Daily Clapacs, Madie Reno, MD   14 mg at 02/10/21 0758  . pantoprazole (PROTONIX) EC tablet 20 mg  20 mg Oral Daily Clapacs, Madie Reno, MD   20 mg at 02/10/21 1601    Lab Results: No results found for this or any previous visit (from the past 48 hour(s)).  Blood Alcohol level:  Lab Results  Component Value Date   ETH <10 01/28/2021   ETH 222 (H) 09/32/3557    Metabolic Disorder Labs: No results found for: HGBA1C, MPG No results found for: PROLACTIN No results found for: CHOL, TRIG, HDL, CHOLHDL, VLDL, LDLCALC  Physical Findings: AIMS:  , ,  ,  ,    CIWA:    COWS:     Musculoskeletal: Strength & Muscle Tone: within normal limits Gait & Station: normal Patient leans: N/A  Psychiatric Specialty Exam:  Presentation  General Appearance: Casual  Eye Contact:Good  Speech:Normal Rate  Speech Volume:Normal  Handedness:Right   Mood and Affect  Mood:Anxious; Depressed  Affect:Congruent   Thought Process  Thought  Processes:Goal Directed  Descriptions of Associations:Intact  Orientation:Full (Time, Place and Person)  Thought Content:Logical  History of Schizophrenia/Schizoaffective disorder:No  Duration of Psychotic Symptoms:No data recorded Hallucinations:Denies Ideas of Reference:None  Suicidal Thoughts:Denies Homicidal Thoughts:Denies  Sensorium  Memory:Immediate Good; Recent Good; Remote Good  Judgment:Intact  Insight:Fair   Executive Functions  Concentration:Good  Attention Span:Good  [place] of Knowledge:Good  Language:Good   Psychomotor Activity  Psychomotor Activity:Normal  Assets  Assets:Communication Skills; Desire for Improvement; Financial Resources/Insurance; Social Support; Talents/Skills   Sleep  Sleep:Sleep: -- (Poor. Patient notes he can fall asleep quickly, but has multiple night time awakening and early morning awakening) Number of Hours of Sleep: 9    Physical Exam: Physical Exam  ROS  Blood pressure 126/84, pulse 96, temperature 98 F (36.7 C), temperature source Oral, resp. rate 17, height 5\' 6"  (1.676 m), weight 60.8 kg, SpO2 99 %. Body mass index is 21.63 kg/m.   Treatment Plan Summary: Daily contact with patient to assess and evaluate symptoms and progress in treatment and Medication management   MDD, recurrent, severe, without psychotic features - Continue Abilify 5 mg daily, continue Lexapro 10 mg QHS  Alcohol use Disorder, Cocaine Use Disorder - Cessation counseling, desires residential treatment up on discharge  Squamous Cell Carcinoma of Oropharynx - Continue radiation treatments, due to finish Weds, May 4th  - Oxycodone 5 mg Q6hr PRN - Gabapentin 300 mg TID  02/10/21: Psychiatric exam above reviewed and remains accurate. Assessment and plan above reviewed and updated.    Salley Scarlet, MD 02/10/2021, 1:52 PM

## 2021-02-11 ENCOUNTER — Ambulatory Visit: Payer: Medicaid Other

## 2021-02-11 DIAGNOSIS — Z510 Encounter for antineoplastic radiation therapy: Secondary | ICD-10-CM | POA: Diagnosis not present

## 2021-02-11 MED ORDER — MENTHOL 3 MG MT LOZG
1.0000 | LOZENGE | OROMUCOSAL | Status: DC | PRN
  Administered 2021-02-11 – 2021-02-16 (×6): 3 mg via ORAL
  Filled 2021-02-11 (×2): qty 9

## 2021-02-11 MED ORDER — TRAZODONE HCL 50 MG PO TABS
150.0000 mg | ORAL_TABLET | Freq: Every day | ORAL | Status: DC
  Administered 2021-02-11 – 2021-02-16 (×6): 150 mg via ORAL
  Filled 2021-02-11 (×6): qty 1

## 2021-02-11 NOTE — Progress Notes (Signed)
Patient is calm and cooperative with assessment. Patient's main complaint is that he still cannot sleep due to racing thoughts at night. He is also endorsing some throat irritation, which is affecting his ability to eat. Patient did not want to take gabapentin anymore, but was compliant with all other medications. He denies SI, HI, and AVH. Support and encouragement provided. Patient remains safe on the unit at this time and q15 min safety checks are maintained.

## 2021-02-11 NOTE — Plan of Care (Signed)

## 2021-02-11 NOTE — Progress Notes (Signed)
[facility] MD Progress Note  02/11/2021 2:12 PM Paul Hebert  MRN:  [id number]   CC "Still not sleeping."  Subjective:  50 year old male presenting for worsening mood and suicidal ideations. No acute events overnight, medication compliant, attending to ADLs.  Patient seen one-on-one today. He continues to report poor sleep overnight. Requests trial of hydroxyzine and trazodone together to aid with sleep. He denies SI/HI/AH/VH. Feels depression and anxiety are much improved. He is looking forward to finishing radiation on Weds. He continues to desire residential substance abuse treatment.     Principal Problem: Severe recurrent major depression without psychotic features ([place]) Diagnosis: Principal Problem:   Severe recurrent major depression without psychotic features ([place]) Active Problems:   Squamous cell carcinoma of oropharynx (HCC)   Cocaine abuse ([place])   Alcohol abuse  Total Time spent with patient: 30 minutes  Past Psychiatric History: See H&P  Past Medical History:  Past Medical History:  Diagnosis Date  . 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 PORT-A-CATH REMOVAL N/A 02/04/2021   Procedure: REMOVAL PORT-A-CATH;  Surgeon: Olean Ree, MD;  Location: [facility] ORS;  Service: General;  Laterality: N/A;  . PORTA CATH INSERTION N/A 10/25/2020   Procedure: PORTA CATH INSERTION;  Surgeon: Algernon Huxley, MD;  Location: [place] CV LAB;  Service: Cardiovascular;  Laterality: N/A;  . REMOVAL OF GASTROSTOMY TUBE N/A 02/04/2021   Procedure: REMOVAL OF GASTROSTOMY TUBE;  Surgeon: Olean Ree, MD;  Location: [facility] ORS;  Service: General;  Laterality: N/A;   Family History:  Family History  Problem Relation Age of Onset  . Cancer Father    Family Psychiatric  History: See H&P Social History:  Social History   Substance and Sexual Activity  Alcohol Use Yes   Comment: 12 beers a day     Social History   Substance and Sexual Activity  Drug Use  Yes  . Types: Marijuana, "Crack" cocaine    Social History   Socioeconomic History  . Marital status: Single    Spouse name: Not on file  . Number of children: Not on file  . Years of education: Not on file  . Highest education level: Not on file  Occupational History  . Not on file  Tobacco Use  . Smoking status: Current Every Day Smoker    Packs/day: 0.50  . Smokeless tobacco: Never Used  Vaping Use  . Vaping Use: Never used  Substance and Sexual Activity  . Alcohol use: Yes    Comment: 12 beers a day  . Drug use: Yes    Types: Marijuana, "Crack" cocaine  . Sexual activity: Not Currently  Other Topics Concern  . Not on file  Social History Narrative  . Not on file   Social Determinants of Health   Financial Resource Strain: Not on file  Food Insecurity: Not on file  Transportation Needs: Not on file  Physical Activity: Not on file  Stress: Not on file  Social Connections: Not on file   Additional Social History:    Sleep: Poor  Appetite:  Good  Current Medications: Current Facility-Administered Medications  Medication Dose Route Frequency Provider Last Rate Last Admin  . acetaminophen (TYLENOL) tablet 325 mg  325 mg Oral Q6H PRN Shanlever, Pierce Crane, RPH      . acetaminophen (TYLENOL) tablet 650 mg  650 mg Oral Q6H PRN Clapacs, Madie Reno, MD   650 mg at 02/09/21 2121  . alum & mag hydroxide-simeth (  MAALOX/MYLANTA) 200-200-20 MG/5ML suspension 30 mL  30 mL Oral Q4H PRN Clapacs, John T, MD      . ARIPiprazole (ABILIFY) tablet 5 mg  5 mg Oral Daily Salley Scarlet, MD   5 mg at 02/11/21 0805  . escitalopram (LEXAPRO) tablet 10 mg  10 mg Oral Daily Clapacs, Madie Reno, MD   10 mg at 02/11/21 0805  . feeding supplement (ENSURE ENLIVE / ENSURE PLUS) liquid 237 mL  237 mL Oral TID BM Clapacs, John T, MD   237 mL at 02/11/21 1028  . hydrOXYzine (ATARAX/VISTARIL) tablet 50 mg  50 mg Oral TID PRN Salley Scarlet, MD   50 mg at 02/11/21 0810  . hydrOXYzine (ATARAX/VISTARIL)  tablet 50 mg  50 mg Oral QHS Salley Scarlet, MD   50 mg at 02/10/21 2104  . magnesium hydroxide (MILK OF MAGNESIA) suspension 30 mL  30 mL Oral Daily PRN Clapacs, John T, MD      . menthol-cetylpyridinium (CEPACOL) lozenge 3 mg  1 lozenge Oral Q3H PRN Salley Scarlet, MD   3 mg at 02/11/21 1217  . multivitamin with minerals tablet 1 tablet  1 tablet Oral Daily Clapacs, Madie Reno, MD   1 tablet at 02/11/21 0805  . neomycin-bacitracin-polymyxin (NEOSPORIN) ointment   Topical Daily Clapacs, Madie Reno, MD   Given at 02/11/21 [phone]  . nicotine (NICODERM CQ - dosed in mg/24 hours) patch 14 mg  14 mg Transdermal Daily Clapacs, Madie Reno, MD   14 mg at 02/11/21 0806  . pantoprazole (PROTONIX) EC tablet 20 mg  20 mg Oral Daily Clapacs, Madie Reno, MD   20 mg at 02/11/21 0805  . traZODone (DESYREL) tablet 150 mg  150 mg Oral QHS Salley Scarlet, MD        Lab Results: No results found for this or any previous visit (from the past 48 hour(s)).  Blood Alcohol level:  Lab Results  Component Value Date   ETH <10 01/28/2021   ETH 222 (H) 56/81/2751    Metabolic Disorder Labs: No results found for: HGBA1C, MPG No results found for: PROLACTIN No results found for: CHOL, TRIG, HDL, CHOLHDL, VLDL, LDLCALC  Physical Findings: AIMS:  , ,  ,  ,    CIWA:    COWS:     Musculoskeletal: Strength & Muscle Tone: within normal limits Gait & Station: normal Patient leans: N/A  Psychiatric Specialty Exam:  Presentation  General Appearance: Casual  Eye Contact:Good  Speech:Normal Rate  Speech Volume:Normal  Handedness:Right   Mood and Affect  Mood:Euthymic Affect:Congruent, brighter  Thought Process  Thought Processes:Goal Directed  Descriptions of Associations:Intact  Orientation:Full (Time, Place and Person)  Thought Content:Logical  History of Schizophrenia/Schizoaffective disorder:No  Duration of Psychotic Symptoms:No data recorded Hallucinations:Denies Ideas of Reference:None  Suicidal  Thoughts:Denies Homicidal Thoughts:Denies  Sensorium  Memory:Immediate Good; Recent Good; Remote Good  Judgment:Intact  Insight:Fair   Executive Functions  Concentration:Good  Attention Span:Good  Lane of Knowledge:Good  Language:Good   Psychomotor Activity  Psychomotor Activity:Normal  Assets  Assets:Communication Skills; Desire for Improvement; Financial Resources/Insurance; Social Support; Talents/Skills   Sleep  Sleep:Sleep: -- (Poor. Patient notes he can fall asleep quickly, but has multiple night time awakening and early morning awakening) Number of Hours of Sleep: 7.45    Physical Exam: Physical Exam  ROS  Blood pressure (!) 147/87, pulse 94, temperature 98 F (36.7 C), temperature source Oral, resp. rate 17, height 5\' 6"  (1.676 m), weight 60.8 kg,  SpO2 99 %. Body mass index is 21.63 kg/m.   Treatment Plan Summary: Daily contact with patient to assess and evaluate symptoms and progress in treatment and Medication management   MDD, recurrent, severe, without psychotic features- improved - Continue Abilify 5 mg daily, continue Lexapro 10 mg QHS  Alcohol use Disorder, Cocaine Use Disorder - Cessation counseling, desires residential treatment up on discharge  Squamous Cell Carcinoma of Oropharynx - Continue radiation treatments, due to finish Weds, May 4th  - Patient has not used Gabapentin or Oxycodone in several days. Requests removal from the [facility] to assist with sobriety.    02/11/21: Psychiatric exam above reviewed and remains accurate. Assessment and plan above reviewed and updated.    Salley Scarlet, MD 02/11/2021, 2:12 PM

## 2021-02-11 NOTE — Progress Notes (Signed)
Patient has been pleasant and cooperative. Denies SI, HI and AVH. No complaints of pain. Is talking about how grateful he is to have survived his cancer diagnosis

## 2021-02-11 NOTE — Progress Notes (Signed)
Patient calm and cooperative during assessment denying SI/HI/AVH. Patient observed interacting appropriately with staff and peers on the unit. Patient compliant with medication administration per MD orders. Pt given education, support, and encouragement to be active in his treatment plan. Patient being monitored Q 15 minutes for safety per unit protocol. Pt remains safe on the unit.  

## 2021-02-11 NOTE — Progress Notes (Signed)
Recreation Therapy Notes   Date: 02/11/2021  Time: 10:00 am   Location: Craft room   Behavioral response: Appropriate  Intervention Topic: Relaxation   Discussion/Intervention:  Group content today was focused on relaxation. The group defined relaxation and identified healthy ways to relax. Individuals expressed how much time they spend relaxing. Patients expressed how much their life would be if they did not make time for themselves to relax. The group stated ways they could improve their relaxation techniques in the future.  Individuals participated in the intervention "Time to Relax" where they had a chance to experience different relaxation techniques.  Clinical Observations/Feedback: Patient came to group and expressed that relaxation is being at peace with yourself. He stated he likes to go fishing and rest to relax. Participant explained that his anxiety normally stops him from relaxing. Individual was social with staff and peers while participating in the intervention. Kaiven Vester LRT/CTRS         Marven Veley 02/11/2021 12:27 PM

## 2021-02-11 NOTE — Plan of Care (Signed)
Patient endorses anxiety  Problem: Self-Concept: Goal: Level of anxiety will decrease Outcome: Not Progressing

## 2021-02-11 NOTE — [facility] Group Notes (Signed)
LCSW Group Therapy Note  02/11/2021 2:57 PM  Type of Therapy and Topic:  Group Therapy:  Feelings around Relapse and Recovery  Participation Level:  Active   Description of Group:    Patients in this group will discuss emotions they experience before and after a relapse. They will process how experiencing these feelings, or avoidance of experiencing them, relates to having a relapse. Facilitator will guide patients to explore emotions they have related to recovery. Patients will be encouraged to process which emotions are more powerful. They will be guided to discuss the emotional reaction significant others in their lives may have to their relapse or recovery. Patients will be assisted in exploring ways to respond to the emotions of others without this contributing to a relapse.  Therapeutic Goals: 1. Patient will identify two or more emotions that lead to a relapse for them 2. Patient will identify two emotions that result when they relapse 3. Patient will identify two emotions related to recovery 4. Patient will demonstrate ability to communicate their needs through discussion and/or role plays   Summary of Patient Progress: Patient was present for the entirety of the group session. Patient was an active listener and participated in the topic of discussion, provided helpful advice to others, and added nuance to topic of conversation. Patient shared past experiences with addiction and pursuit toward sobriety. Patients shared resources that have been helpful in the past.     Therapeutic Modalities:   Cognitive Behavioral Therapy Solution-Focused Therapy Assertiveness Training Relapse Prevention Therapy   Paulla Dolly, MSW, [place] 02/11/2021 2:57 PM

## 2021-02-12 DIAGNOSIS — F332 Major depressive disorder, recurrent severe without psychotic features: Secondary | ICD-10-CM

## 2021-02-12 NOTE — Plan of Care (Addendum)
Pt denies depression, Si, HI and AVH. Pt rates anxiety 4/10 and hopelessness 1/10. Pt was educated on care plan and verbalizes understanding. Collier Bullock RN Problem: Education: Goal: Utilization of techniques to improve thought processes will improve Outcome: Progressing Goal: Knowledge of the prescribed therapeutic regimen will improve Outcome: Progressing   Problem: Activity: Goal: Interest or engagement in leisure activities will improve Outcome: Progressing Goal: Imbalance in normal sleep/wake cycle will improve Outcome: Progressing   Problem: Coping: Goal: Coping ability will improve Outcome: Progressing Goal: Will verbalize feelings Outcome: Progressing   Problem: Health Behavior/Discharge Planning: Goal: Ability to make decisions will improve Outcome: Progressing Goal: Compliance with therapeutic regimen will improve Outcome: Progressing   Problem: Role Relationship: Goal: Will demonstrate positive changes in social behaviors and relationships Outcome: Progressing   Problem: Safety: Goal: Ability to disclose and discuss suicidal ideas will improve Outcome: Progressing Goal: Ability to identify and utilize support systems that promote safety will improve Outcome: Progressing   Problem: Self-Concept: Goal: Will verbalize positive feelings about self Outcome: Progressing Goal: Level of anxiety will decrease Outcome: Progressing

## 2021-02-12 NOTE — Progress Notes (Signed)
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   Interval History Patient was seen today for re-evaluation.  Nursing reports no events overnight. The patient has no issues with performing ADLs.  Patient has been medication compliant.    Subjective:  On assessment patient reports "I am doing okay. Nothing to complain about.". Reports he is "in limbo" waiting to finish his radiation treatment, so he can be referred to an inpatient substance-abuse treatment program. He reports improved sleep after he took hydroxyzine and trazodone together last night. Denies feeling depressed, reports increased anxiety due to current life situation. We discussed that he can use "as needed" Hydroxyzine up to three times a day to help with anxiety. Patient agreed to try the medication. He denies suicidal or homicidal thoughts, auditory or visual hallucinations. He continues to desire residential substance abuse treatment.    The patient reports no side effects from medications.    Labs: no new results for review.   Total Time spent with patient: 20 minutes  Past Psychiatric History: see H&P   Past Medical History:  Past Medical History:  Diagnosis Date  . 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 PORT-A-CATH REMOVAL N/A 02/04/2021   Procedure: REMOVAL PORT-A-CATH;  Surgeon: Olean Ree, MD;  Location:  [facility] ORS;  Service: General;  Laterality: N/A;  . PORTA CATH INSERTION N/A 10/25/2020   Procedure: PORTA CATH INSERTION;  Surgeon: Algernon Huxley, MD;  Location: [place] CV LAB;  Service: Cardiovascular;  Laterality: N/A;  . REMOVAL OF GASTROSTOMY TUBE N/A 02/04/2021   Procedure: REMOVAL OF GASTROSTOMY TUBE;  Surgeon: Olean Ree, MD;  Location: [facility] ORS;  Service: General;  Laterality: N/A;   Family History:  Family History  Problem Relation Age of Onset  . Cancer Father    Family Psychiatric  History: see H&P Social History:  Social History   Substance and Sexual Activity  Alcohol Use Yes   Comment: 12 beers a day     Social History   Substance and Sexual Activity  Drug Use Yes  . Types: Marijuana, "Crack" cocaine    Social History   Socioeconomic History  . Marital status: Single    Spouse name: Not on file  . Number of children: Not on file  . Years of education: Not on file  . Highest education level: Not on file  Occupational History  . Not on file  Tobacco Use  . Smoking status: Current Every Day Smoker    Packs/day: 0.50  . Smokeless tobacco: Never Used  Vaping Use  . Vaping Use: Never used  Substance and Sexual Activity  . Alcohol use: Yes    Comment: 12 beers a day  . Drug use: Yes    Types: Marijuana, "Crack" cocaine  . Sexual activity: Not Currently  Other Topics Concern  . Not on file  Social History Narrative  . Not on file   Social  Determinants of Health   Financial Resource Strain: Not on file  Food Insecurity: Not on file  Transportation Needs: Not on file  Physical Activity: Not on file  Stress: Not on file  Social Connections: Not on file   Additional Social History:                         Sleep: Good  Appetite:  Good  Current Medications: Current Facility-Administered Medications  Medication Dose Route Frequency Provider Last Rate Last Admin  . acetaminophen (TYLENOL) tablet 325 mg  325 mg Oral Q6H PRN  Shanlever, Pierce Crane, RPH      . acetaminophen (TYLENOL) tablet 650 mg  650 mg Oral Q6H PRN Clapacs, Madie Reno, MD   650 mg at 02/09/21 2121  . alum & mag hydroxide-simeth (MAALOX/MYLANTA) 200-200-20 MG/5ML suspension 30 mL  30 mL Oral Q4H PRN Clapacs, John T, MD      . ARIPiprazole (ABILIFY) tablet 5 mg  5 mg Oral Daily Salley Scarlet, MD   5 mg at 02/12/21 0804  . escitalopram (LEXAPRO) tablet 10 mg  10 mg Oral Daily Clapacs, Madie Reno, MD   10 mg at 02/12/21 0804  . feeding supplement (ENSURE ENLIVE / ENSURE PLUS) liquid 237 mL  237 mL Oral TID BM Clapacs, Madie Reno, MD   237 mL at 02/11/21 2104  . hydrOXYzine (ATARAX/VISTARIL) tablet 50 mg  50 mg Oral TID PRN Salley Scarlet, MD   50 mg at 02/12/21 1201  . hydrOXYzine (ATARAX/VISTARIL) tablet 50 mg  50 mg Oral QHS Salley Scarlet, MD   50 mg at 02/11/21 2103  . magnesium hydroxide (MILK OF MAGNESIA) suspension 30 mL  30 mL Oral Daily PRN Clapacs, John T, MD      . menthol-cetylpyridinium (CEPACOL) lozenge 3 mg  1 lozenge Oral Q3H PRN Salley Scarlet, MD   3 mg at 02/12/21 1203  . multivitamin with minerals tablet 1 tablet  1 tablet Oral Daily Clapacs, Madie Reno, MD   1 tablet at 02/12/21 0804  . neomycin-bacitracin-polymyxin (NEOSPORIN) ointment   Topical Daily Clapacs, Madie Reno, MD   Given at 02/11/21 [id number]  . nicotine (NICODERM CQ - dosed in mg/24 hours) patch 14 mg  14 mg Transdermal Daily Clapacs, Madie Reno, MD   14 mg at 02/11/21 0806  . pantoprazole (PROTONIX) EC tablet 20 mg  20 mg Oral Daily Clapacs, Madie Reno, MD   20 mg at 02/12/21 0804  . traZODone (DESYREL) tablet 150 mg  150 mg Oral QHS Salley Scarlet, MD   150 mg at 02/11/21 2103    Lab Results: No results found for this or any previous visit (from the past 48 hour(s)).  Blood Alcohol level:  Lab Results  Component Value Date   ETH <10 01/28/2021   ETH 222 (H) 04/08/7627    Metabolic Disorder Labs: No results found for: HGBA1C, MPG No results found for: PROLACTIN No results found  for: CHOL, TRIG, HDL, CHOLHDL, VLDL, LDLCALC  Physical Findings: AIMS:  , ,  ,  ,    CIWA:    COWS:     Musculoskeletal: Strength & Muscle Tone: within normal limits Gait & Station: normal Patient leans: N/A  Psychiatric Specialty Exam: Appearance:  CM, appearing stated age, wearing appropriate to the situation clothes, with fair grooming and hygiene. Normal level of alertness and appropriate facial expression.  Attitude/Behavior: calm, cooperative, engaging with appropriate eye contact.  Motor: WNL;  dyskinesias not evident. Gait appears in full range.  Speech: spontaneous, clear, coherent, normal comprehension.  Mood: euthymic, " good ".  Affect: appropriately-reactive, full range.  Thought process: patient appears coherent, organized, logical, goal-directed, associations are appropriate.  Thought content: patient denies suicidal thoughts, denies homicidal thoughts; did not express any delusions.  Thought perception: patient denies auditory and visual hallucinations. Did not appear internally stimulated.  Cognition: patient is alert and oriented in self, place, date; with intact abstract, fund of knowledge, attention and concentration.  Insight: good, in regards of understanding of presence, nature, cause, and significance of mental or emotional problem.  Judgement: good, in regards of ability to make good decisions concerning the appropriate thing to do in various situations, including ability to form opinions regarding their mental health condition.  Assets  Assets:Communication Skills; Desire for Improvement; Financial Resources/Insurance; Social Support; Talents/Skills   Sleep  Sleep:No data recorded   Physical Exam: Physical Exam ROS Blood pressure (!) 114/98, pulse 97, temperature 97.7 F (36.5 C), temperature source Oral, resp. rate 18, height 5\' 6"  (1.676 m), weight 60.8 kg, SpO2 100 %. Body mass index is 21.63 kg/m.   Treatment Plan Summary: Daily  contact with patient to assess and evaluate symptoms and progress in treatment and Medication management  Patient is a 50 year old male with the above-stated past psychiatric history who is seen in follow-up.  Chart reviewed. Patient discussed with nursing. Patient`s mood appears to be continuously-improving, no safety concerns currently. No medication changes made today.    Plan: -continue inpatient psych admission; 15-minute checks; daily contact with patient to assess and evaluate symptoms and progress in treatment; psychoeducation.  -continue scheduled medications: . ARIPiprazole  5 mg Oral Daily  . escitalopram  10 mg Oral Daily  . feeding supplement  237 mL Oral TID BM  . hydrOXYzine  50 mg Oral QHS  . multivitamin with minerals  1 tablet Oral Daily  . neomycin-bacitracin-polymyxin   Topical Daily  . nicotine  14 mg Transdermal Daily  . pantoprazole  20 mg Oral Daily  . traZODone  150 mg Oral QHS    -continue PRN medications.  [DISCONTINUED] oxyCODONE **AND** acetaminophen, acetaminophen, alum & mag hydroxide-simeth, hydrOXYzine, magnesium hydroxide, menthol-cetylpyridinium  -Pertinent Labs: no new labs ordered today    -Disposition: patient desires residential treatment up on discharge  -  I certify that the patient does need, on a daily basis, active treatment furnished directly by or requiring the supervision of inpatient psychiatric facility personnel.   Larita Fife, MD 02/12/2021, 12:04 PM

## 2021-02-12 NOTE — Tx Team (Signed)
Interdisciplinary Treatment and Diagnostic Plan Update  02/12/2021 Time of Session: 9:40 AM LIDO MASKE MRN: [id number]  Principal Diagnosis: Severe recurrent major depression without psychotic features [facility])  Secondary Diagnoses: Principal Problem:   Severe recurrent major depression without psychotic features ([name]) Active Problems:   Squamous cell carcinoma of oropharynx (HCC)   Cocaine abuse ([place])   Alcohol abuse   Current Medications:  Current Facility-Administered Medications  Medication Dose Route Frequency Provider Last Rate Last Admin  . acetaminophen (TYLENOL) tablet 325 mg  325 mg Oral Q6H PRN Shanlever, Pierce Crane, RPH      . acetaminophen (TYLENOL) tablet 650 mg  650 mg Oral Q6H PRN Clapacs, Madie Reno, MD   650 mg at 02/09/21 2121  . alum & mag hydroxide-simeth (MAALOX/MYLANTA) 200-200-20 MG/5ML suspension 30 mL  30 mL Oral Q4H PRN Clapacs, John T, MD      . ARIPiprazole (ABILIFY) tablet 5 mg  5 mg Oral Daily Salley Scarlet, MD   5 mg at 02/12/21 0804  . escitalopram (LEXAPRO) tablet 10 mg  10 mg Oral Daily Clapacs, Madie Reno, MD   10 mg at 02/12/21 0804  . feeding supplement (ENSURE ENLIVE / ENSURE PLUS) liquid 237 mL  237 mL Oral TID BM Clapacs, Madie Reno, MD   237 mL at 02/11/21 2104  . hydrOXYzine (ATARAX/VISTARIL) tablet 50 mg  50 mg Oral TID PRN Salley Scarlet, MD   50 mg at 02/11/21 0810  . hydrOXYzine (ATARAX/VISTARIL) tablet 50 mg  50 mg Oral QHS Salley Scarlet, MD   50 mg at 02/11/21 2103  . magnesium hydroxide (MILK OF MAGNESIA) suspension 30 mL  30 mL Oral Daily PRN Clapacs, John T, MD      . menthol-cetylpyridinium (CEPACOL) lozenge 3 mg  1 lozenge Oral Q3H PRN Salley Scarlet, MD   3 mg at 02/11/21 1517  . multivitamin with minerals tablet 1 tablet  1 tablet Oral Daily Clapacs, Madie Reno, MD   1 tablet at 02/12/21 0804  . neomycin-bacitracin-polymyxin (NEOSPORIN) ointment   Topical Daily Clapacs, Madie Reno, MD   Given at 02/11/21 [phone]  . nicotine (NICODERM CQ - dosed  in mg/24 hours) patch 14 mg  14 mg Transdermal Daily Clapacs, Madie Reno, MD   14 mg at 02/11/21 0806  . pantoprazole (PROTONIX) EC tablet 20 mg  20 mg Oral Daily Clapacs, Madie Reno, MD   20 mg at 02/12/21 0804  . traZODone (DESYREL) tablet 150 mg  150 mg Oral QHS Salley Scarlet, MD   150 mg at 02/11/21 2103   PTA Medications: Medications Prior to Admission  Medication Sig Dispense Refill Last Dose  . amoxicillin-clavulanate (AUGMENTIN) 875-125 MG tablet Take 1 tablet by mouth 2 (two) times daily. 14 tablet 0   . COVID-19 mRNA vaccine, Pfizer, 30 MCG/0.3ML injection AS DIRECTED .3 mL 0   . dexamethasone (DECADRON) 4 MG tablet Take 2 tablets (8 mg total) by mouth daily. Take daily x 3 days starting the day after cisplatin chemotherapy. Take with food. (Patient taking differently: Take 8 mg by mouth See admin instructions. Take 8 mg daily x 3 days starting the day after cisplatin chemotherapy. Take with food.) 30 tablet 1   . escitalopram (LEXAPRO) 10 MG tablet Take 1 tablet (10 mg total) by mouth daily. Take half pill per day for 5 days then increase to one pill per day 30 tablet 1   . fentaNYL (DURAGESIC) 12 MCG/HR Place 1 patch onto the skin  every 3 (three) days. This is additonal to the 25 mcg to make 37 mcg total 10 patch 0   . fentaNYL (DURAGESIC) 25 MCG/HR Place 1 patch onto the skin every 3 (three) days. This is additonal to the12 mcg to make 37 mcg total 10 patch 0   . fluconazole (DIFLUCAN) 100 MG tablet Take 1 tablet (100 mg total) by mouth daily. 7 tablet 0   . gabapentin (NEURONTIN) 300 MG capsule TAKE 1 CAPSULE (300 MG) BY MOUTH 3 TIMESDAILY 90 capsule 2   . lidocaine-prilocaine (EMLA) cream Apply to affected area once (Patient taking differently: Apply 1 application topically daily as needed (port access).) 30 g 3   . loperamide (IMODIUM) 2 MG capsule Take 1 capsule (2 mg total) by mouth as needed for diarrhea or loose stools (take 2 tab with first loose stool, then 1 tab. after every loose  stool after that and maximum 8 a day). 48 capsule 0   . LORazepam (ATIVAN) 0.5 MG tablet Take 1 tablet (0.5 mg total) by mouth every 8 (eight) hours. 60 tablet 0   . Nutritional Supplements (FEEDING SUPPLEMENT, OSMOLITE 1.5 CAL,) LIQD Give 1 1/2 cartons of formula 4 times per day via feeding tube (8am, noon, 4pm and 8pm). Flush with 18m of water before and after each feeding. Drink or give additional 2464mwater TID between feedings via tube for better hydration. 1422 mL 0   . nystatin (MYCOSTATIN) 100000 UNIT/ML suspension Take 5 mLs (500,000 Units total) by mouth 4 (four) times daily. 473 mL 1   . ondansetron (ZOFRAN) 8 MG tablet Take 1 tablet (8 mg total) by mouth 2 (two) times daily as needed. Start on the third day after cisplatin chemotherapy. 30 tablet 1   . Oxycodone HCl 20 MG TABS Take 1 tablet (20 mg total) by mouth every 4 (four) hours as needed. 180 tablet 0   . pantoprazole (PROTONIX) 20 MG tablet TAKE 1 TABLET BY MOUTH DAILY. 30 tablet 1   . prochlorperazine (COMPAZINE) 10 MG tablet Take 1 tablet (10 mg total) by mouth every 6 (six) hours as needed (Nausea or vomiting). 30 tablet 1   . sertraline (ZOLOFT) 50 MG tablet Take 1 tablet (50 mg total) by mouth daily. (Patient not taking: No sig reported) 30 tablet 2     Patient Stressors:    Patient Strengths:    Treatment Modalities: Medication Management, Group therapy, Case management,  1 to 1 session with clinician, Psychoeducation, Recreational therapy.   Physician Treatment Plan for Primary Diagnosis: Severe recurrent major depression without psychotic features ([place] Goal(s): Improvement in symptoms so as ready for discharge Improvement in symptoms so as ready for discharge   Short Term Goals: Ability to verbalize feelings will improve Ability to identify and develop effective coping behaviors will improve  Medication Management: Evaluate patient's response, side effects, and tolerance of medication  regimen.  Therapeutic Interventions: 1 to 1 sessions, Unit Group sessions and Medication administration.  Evaluation of Outcomes: Not Met  Physician Treatment Plan for Secondary Diagnosis: Principal Problem:   Severe recurrent major depression without psychotic features ([place] Problems:   Squamous cell carcinoma of oropharynx (HCC)   Cocaine abuse ([place]  Alcohol abuse  Long Term Goal(s): Improvement in symptoms so as ready for discharge Improvement in symptoms so as ready for discharge   Short Term Goals: Ability to verbalize feelings will improve Ability to identify and develop effective coping behaviors will improve     Medication  Management: Evaluate patient's response, side effects, and tolerance of medication regimen.  Therapeutic Interventions: 1 to 1 sessions, Unit Group sessions and Medication administration.  Evaluation of Outcomes: Not Met   RN Treatment Plan for Primary Diagnosis: Severe recurrent major depression without psychotic features ([place]) Long Term Goal(s): Knowledge of disease and therapeutic regimen to maintain health will improve  Short Term Goals: Ability to verbalize frustration and anger appropriately will improve, Ability to demonstrate self-control, Ability to participate in decision making will improve, Ability to verbalize feelings will improve, Ability to disclose and discuss suicidal ideas and Ability to identify and develop effective coping behaviors will improve  Medication Management: RN will administer medications as ordered by provider, will assess and evaluate patient's response and provide education to patient for prescribed medication. RN will report any adverse and/or side effects to prescribing provider.  Therapeutic Interventions: 1 on 1 counseling sessions, Psychoeducation, Medication administration, Evaluate responses to treatment, Monitor vital signs and CBGs as ordered, Perform/monitor CIWA, COWS, AIMS and Fall Risk screenings as  ordered, Perform wound care treatments as ordered.  Evaluation of Outcomes: Not Met   LCSW Treatment Plan for Primary Diagnosis: Severe recurrent major depression without psychotic features ([place]) Long Term Goal(s): Safe transition to appropriate next level of care at discharge, Engage patient in therapeutic group addressing interpersonal concerns.  Short Term Goals: Engage patient in aftercare planning with referrals and resources, Increase social support, Increase ability to appropriately verbalize feelings, Increase emotional regulation, Facilitate acceptance of mental health diagnosis and concerns, Facilitate patient progression through stages of change regarding substance use diagnoses and concerns, Identify triggers associated with mental health/substance abuse issues and Increase skills for wellness and recovery  Therapeutic Interventions: Assess for all discharge needs, 1 to 1 time with Social worker, Explore available resources and support systems, Assess for adequacy in community support network, Educate family and significant other(s) on suicide prevention, Complete Psychosocial Assessment, Interpersonal group therapy.  Evaluation of Outcomes: Not Met   Progress in Treatment: Attending groups: Yes. Participating in groups: Yes. Taking medication as prescribed: Yes. Toleration medication: Yes. Family/Significant other contact made: Yes, individual(s) contacted:  pt's brother. Patient understands diagnosis: Yes. Discussing patient identified problems/goals with staff: Yes. Medical problems stabilized or resolved: Yes. Denies suicidal/homicidal ideation: Yes. Issues/concerns per patient self-inventory: No. Other: None  New problem(s) identified: No, Describe:  none  New Short Term/Long Term Goal(s): "to get into a treatment center"  Patient Goals:  Update 4/30 - "I am getting a little better sleep now and my goal is to get into a treatment center"  Discharge Plan or Barriers:  CSW will assist pt in obtaining transportation and follow-up care upon discharge.   Reason for Continuation of Hospitalization: Depression Suicidal ideation Withdrawal symptoms  Estimated Length of Stay: 1-7 days  Attendees: Patient: Paul Hebert 02/12/2021 10:21 AM  Physician: Larita Fife, MD 02/12/2021 10:21 AM  Nursing:  02/12/2021 10:21 AM  RN Care Manager: 02/12/2021 10:21 AM  Social Worker: Rise Mu, [place] 02/12/2021 10:21 AM  Recreational Therapist:  02/12/2021 10:21 AM  Other:  02/12/2021 10:21 AM  Other:  02/12/2021 10:21 AM  Other: 02/12/2021 10:21 AM    Scribe for Treatment Team: Tye Savoy, LCSW 02/12/2021 10:21 AM

## 2021-02-12 NOTE — [facility] Group Notes (Signed)
LCSW Group Therapy Note  02/12/2021   1:00-1:50pm   Type of Therapy and Topic:  Group Therapy: Anger Cues and Responses  Participation Level:  Active   Description of Group:   In this group, patients learned how to recognize the physical, cognitive, emotional, and behavioral responses they have to anger-provoking situations.  They identified a recent time they became angry and how they reacted.  They analyzed how their reaction was possibly beneficial and how it was possibly unhelpful.  The group discussed a variety of healthier coping skills that could help with such a situation in the future.  Focus was placed on how helpful it is to recognize the underlying emotions to our anger, because working on those can lead to a more permanent solution as well as our ability to focus on the important rather than the urgent.  Therapeutic Goals: 1. Patients will remember their last incident of anger and how they felt emotionally and physically, what their thoughts were at the time, and how they behaved. 2. Patients will identify how their behavior at that time worked for them, as well as how it worked against them. 3. Patients will explore possible new behaviors to use in future anger situations. 4. Patients will learn that anger itself is normal and cannot be eliminated, and that healthier reactions can assist with resolving conflict rather than worsening situations.  Summary of Patient Progress:  Patient was present and engaged in group discussion. Patient shared the last time he was angry was when the shower went ice cold. Patient was able to identify emotions that one might feel such as hopelessness that could lead to anger. Patient shared with group positive ways to cope with anger.   Therapeutic Modalities:   Cognitive Behavioral Therapy  Tye Savoy

## 2021-02-13 DIAGNOSIS — F17210 Nicotine dependence, cigarettes, uncomplicated: Secondary | ICD-10-CM | POA: Diagnosis present

## 2021-02-13 DIAGNOSIS — Z5900 Homelessness unspecified: Secondary | ICD-10-CM | POA: Diagnosis not present

## 2021-02-13 DIAGNOSIS — Z510 Encounter for antineoplastic radiation therapy: Secondary | ICD-10-CM | POA: Diagnosis present

## 2021-02-13 DIAGNOSIS — F1010 Alcohol abuse, uncomplicated: Secondary | ICD-10-CM | POA: Diagnosis present

## 2021-02-13 DIAGNOSIS — F332 Major depressive disorder, recurrent severe without psychotic features: Secondary | ICD-10-CM | POA: Diagnosis not present

## 2021-02-13 DIAGNOSIS — C109 Malignant neoplasm of oropharynx, unspecified: Secondary | ICD-10-CM | POA: Diagnosis present

## 2021-02-13 DIAGNOSIS — Z85818 Personal history of malignant neoplasm of other sites of lip, oral cavity, and pharynx: Secondary | ICD-10-CM | POA: Diagnosis not present

## 2021-02-13 DIAGNOSIS — F322 Major depressive disorder, single episode, severe without psychotic features: Secondary | ICD-10-CM | POA: Diagnosis present

## 2021-02-13 DIAGNOSIS — K219 Gastro-esophageal reflux disease without esophagitis: Secondary | ICD-10-CM | POA: Diagnosis present

## 2021-02-13 DIAGNOSIS — R45851 Suicidal ideations: Secondary | ICD-10-CM | POA: Diagnosis present

## 2021-02-13 DIAGNOSIS — Z20822 Contact with and (suspected) exposure to covid-19: Secondary | ICD-10-CM | POA: Diagnosis present

## 2021-02-13 DIAGNOSIS — F1410 Cocaine abuse, uncomplicated: Secondary | ICD-10-CM | POA: Diagnosis present

## 2021-02-13 NOTE — Plan of Care (Signed)

## 2021-02-13 NOTE — Progress Notes (Signed)
Patient is pleasant and cooperative.  He is med complaint and tolerated his meds without incident. He denies SI  HI  AVH. He reports that depression and anxiety "all but gone, and I am feeling much better."  He was encouraged to come to staff with any concerns and remains monitored with Q15 minute safety rounds.     Cleo Butler-Nicholson, LPN

## 2021-02-13 NOTE — Progress Notes (Signed)
[facility] MD Progress Note  02/13/2021 1:03 PM Paul Hebert  MRN:  [id number]  Principal Problem: Severe recurrent major depression without psychotic features ([place]) Diagnosis: Principal Problem:   Severe recurrent major depression without psychotic features ([place]) Active Problems:   Squamous cell carcinoma of oropharynx ([place])   Cocaine abuse ([place])   Alcohol abuse  Paul Hebert is a 50 y.o.male patient with a history of depression, alcohol use disorder, cocaine use disorder, who presents to the [facility] unit for treatment of worsened depression with suicidal ideations in the context of recent relapse on drugs and social stressors.   Interval History Patient was seen today for re-evaluation.  Nursing reports no events overnight. The patient has no issues with performing ADLs.  Patient has been medication compliant.    Subjective:  On assessment patient reports "I am doing great. Still nothing to complain about." He reports good sleep on his current medication regimen. Denies feeling depressed, reports increased anxiety due to current life situation - waiting to finish his radiation treatment, so he can be referred to an inpatient substance-abuse treatment program. He denies suicidal or homicidal thoughts, auditory or visual hallucinations.  The patient reports no side effects from medications.    Labs: no new results for review.   Total Time spent with patient: 20 minutes  Past Psychiatric History: see H&P   Past Medical History:  Past Medical History:  Diagnosis Date  . 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 PORT-A-CATH REMOVAL N/A 02/04/2021   Procedure: REMOVAL PORT-A-CATH;  Surgeon: Olean Ree, MD;  Location: [facility] ORS;  Service: General;  Laterality: N/A;  . PORTA CATH INSERTION N/A 10/25/2020   Procedure: PORTA CATH INSERTION;  Surgeon: Algernon Huxley, MD;  Location: [place] CV LAB;  Service: Cardiovascular;  Laterality: N/A;  . REMOVAL OF  GASTROSTOMY TUBE N/A 02/04/2021   Procedure: REMOVAL OF GASTROSTOMY TUBE;  Surgeon: Olean Ree, MD;  Location: [facility] ORS;  Service: General;  Laterality: N/A;   Family History:  Family History  Problem Relation Age of Onset  . Cancer Father    Family Psychiatric  History: see H&P Social History:  Social History   Substance and Sexual Activity  Alcohol Use Yes   Comment: 12 beers a day     Social History   Substance and Sexual Activity  Drug Use Yes  . Types: Marijuana, "Crack" cocaine    Social History   Socioeconomic History  . Marital status: Single    Spouse name: Not on file  . Number of children: Not on file  . Years of education: Not on file  . Highest education level: Not on file  Occupational History  . Not on file  Tobacco Use  . Smoking status: Current Every Day Smoker    Packs/day: 0.50  . Smokeless tobacco: Never Used  Vaping Use  . Vaping Use: Never used  Substance and Sexual Activity  . Alcohol use: Yes    Comment: 12 beers a day  . Drug use: Yes    Types: Marijuana, "Crack" cocaine  . Sexual activity: Not Currently  Other Topics Concern  . Not on file  Social History Narrative  . Not on file   Social Determinants of Health   Financial Resource Strain: Not on file  Food Insecurity: Not on file  Transportation Needs: Not on file  Physical Activity: Not on file  Stress: Not on file  Social Connections: Not on file  Additional Social History:                         Sleep: Good  Appetite:  Good  Current Medications: Current Facility-Administered Medications  Medication Dose Route Frequency Provider Last Rate Last Admin  . acetaminophen (TYLENOL) tablet 325 mg  325 mg Oral Q6H PRN Shanlever, Pierce Crane, RPH      . acetaminophen (TYLENOL) tablet 650 mg  650 mg Oral Q6H PRN Clapacs, Madie Reno, MD   650 mg at 02/09/21 2121  . alum & mag hydroxide-simeth (MAALOX/MYLANTA) 200-200-20 MG/5ML suspension 30 mL  30 mL Oral Q4H PRN Clapacs,  John T, MD      . ARIPiprazole (ABILIFY) tablet 5 mg  5 mg Oral Daily Salley Scarlet, MD   5 mg at 02/13/21 0160  . escitalopram (LEXAPRO) tablet 10 mg  10 mg Oral Daily Clapacs, Madie Reno, MD   10 mg at 02/13/21 0807  . feeding supplement (ENSURE ENLIVE / ENSURE PLUS) liquid 237 mL  237 mL Oral TID BM Clapacs, John T, MD   237 mL at 02/13/21 1117  . hydrOXYzine (ATARAX/VISTARIL) tablet 50 mg  50 mg Oral TID PRN Salley Scarlet, MD   50 mg at 02/13/21 0810  . hydrOXYzine (ATARAX/VISTARIL) tablet 50 mg  50 mg Oral QHS Salley Scarlet, MD   50 mg at 02/12/21 2102  . magnesium hydroxide (MILK OF MAGNESIA) suspension 30 mL  30 mL Oral Daily PRN Clapacs, John T, MD      . menthol-cetylpyridinium (CEPACOL) lozenge 3 mg  1 lozenge Oral Q3H PRN Salley Scarlet, MD   3 mg at 02/12/21 1203  . multivitamin with minerals tablet 1 tablet  1 tablet Oral Daily Clapacs, Madie Reno, MD   1 tablet at 02/13/21 [phone]  . neomycin-bacitracin-polymyxin (NEOSPORIN) ointment   Topical Daily Clapacs, Madie Reno, MD   Given at 02/13/21 [phone]  . nicotine (NICODERM CQ - dosed in mg/24 hours) patch 14 mg  14 mg Transdermal Daily Clapacs, Madie Reno, MD   14 mg at 02/11/21 0806  . pantoprazole (PROTONIX) EC tablet 20 mg  20 mg Oral Daily Clapacs, Madie Reno, MD   20 mg at 02/13/21 0807  . traZODone (DESYREL) tablet 150 mg  150 mg Oral QHS Salley Scarlet, MD   150 mg at 02/12/21 2102    Lab Results: No results found for this or any previous visit (from the past 48 hour(s)).  Blood Alcohol level:  Lab Results  Component Value Date   ETH <10 01/28/2021   ETH 222 (H) 73/22/0254    Metabolic Disorder Labs: No results found for: HGBA1C, MPG No results found for: PROLACTIN No results found for: CHOL, TRIG, HDL, CHOLHDL, VLDL, LDLCALC  Physical Findings: AIMS:  , ,  ,  ,    CIWA:    COWS:     Musculoskeletal: Strength & Muscle Tone: within normal limits Gait & Station: normal Patient leans: N/A  Psychiatric Specialty  Exam: Appearance:  CM, appearing stated age, wearing appropriate to the situation clothes, with fair grooming and hygiene. Normal level of alertness and appropriate facial expression.  Attitude/Behavior: calm, cooperative, engaging with appropriate eye contact.  Motor: WNL; dyskinesias not evident. Gait appears in full range.  Speech: spontaneous, clear, coherent, normal comprehension.  Mood: euthymic, " good ".  Affect: appropriately-reactive, full range.  Thought process: patient appears coherent, organized, logical, goal-directed, associations are appropriate.  Thought content: patient  denies suicidal thoughts, denies homicidal thoughts; did not express any delusions.  Thought perception: patient denies auditory and visual hallucinations. Did not appear internally stimulated.  Cognition: patient is alert and oriented in self, place, date; with intact abstract, fund of knowledge, attention and concentration.  Insight: good, in regards of understanding of presence, nature, cause, and significance of mental or emotional problem.  Judgement: good, in regards of ability to make good decisions concerning the appropriate thing to do in various situations, including ability to form opinions regarding their mental health condition.  Assets  Assets:Communication Skills; Desire for Improvement; Financial Resources/Insurance; Social Support; Talents/Skills   Sleep  Sleep:No data recorded   Physical Exam: Physical Exam  ROS  Blood pressure 115/82, pulse 100, temperature 98 F (36.7 C), temperature source Oral, resp. rate 18, height 5\' 6"  (1.676 m), weight 60.8 kg, SpO2 100 %. Body mass index is 21.63 kg/m.   Treatment Plan Summary: Daily contact with patient to assess and evaluate symptoms and progress in treatment and Medication management  Patient is a 50 year old male with the above-stated past psychiatric history who is seen in follow-up.  Chart reviewed. Patient discussed  with nursing. Patient`s mood appears to be continuously-improving, no safety concerns currently. No medication changes made today.    Plan: -continue inpatient psych admission; 15-minute checks; daily contact with patient to assess and evaluate symptoms and progress in treatment; psychoeducation.  -continue scheduled medications: . ARIPiprazole  5 mg Oral Daily  . escitalopram  10 mg Oral Daily  . feeding supplement  237 mL Oral TID BM  . hydrOXYzine  50 mg Oral QHS  . multivitamin with minerals  1 tablet Oral Daily  . neomycin-bacitracin-polymyxin   Topical Daily  . nicotine  14 mg Transdermal Daily  . pantoprazole  20 mg Oral Daily  . traZODone  150 mg Oral QHS    -continue PRN medications.  [DISCONTINUED] oxyCODONE **AND** acetaminophen, acetaminophen, alum & mag hydroxide-simeth, hydrOXYzine, magnesium hydroxide, menthol-cetylpyridinium  -Pertinent Labs: no new labs ordered today    -Disposition: patient desires residential treatment up on discharge  -  I certify that the patient does need, on a daily basis, active treatment furnished directly by or requiring the supervision of inpatient psychiatric facility personnel.   Larita Fife, MD 02/13/2021, 1:03 PM

## 2021-02-13 NOTE — Progress Notes (Signed)
Patient is calm and cooperative with assessment. He denies SI, HI, and AVH. Affect, speech, and mood are all appropriate. Patient endorses anxiety and rates it as a 4/10. He requested and was given Vistaril PRN. Patient also requested throat lozenges and reports that they have been helping with throat irritation. Patient also reports that he is sleeping better and was able to sleep through the night last night with Vistaril and trazodone. Patient is active on the unit and interacts appropriately with staff and other patients on the unit. Support and encouragement provided. Patient remains safe on the unit at this time and q15 min safety checks are maintained.

## 2021-02-14 ENCOUNTER — Ambulatory Visit: Payer: Medicaid Other

## 2021-02-14 ENCOUNTER — Ambulatory Visit: Payer: Medicaid Other | Attending: Radiation Oncology

## 2021-02-14 DIAGNOSIS — Z510 Encounter for antineoplastic radiation therapy: Secondary | ICD-10-CM | POA: Diagnosis not present

## 2021-02-14 DIAGNOSIS — F332 Major depressive disorder, recurrent severe without psychotic features: Secondary | ICD-10-CM | POA: Diagnosis not present

## 2021-02-14 DIAGNOSIS — C109 Malignant neoplasm of oropharynx, unspecified: Secondary | ICD-10-CM | POA: Insufficient documentation

## 2021-02-14 NOTE — Progress Notes (Signed)
[facility] MD Progress Note  02/14/2021 3:35 PM Paul Hebert  MRN:  [id number] Subjective: Follow-up for this 50 year old man with depression and substance abuse problems.  Patient states that his mood is much better.  He denies any current suicidal thoughts.  Still feeling anxious and worried especially about his discharge plan.  Patient is neatly dressed and groomed and is cooperative with treatment.  He is going to his radiation appointments and says he still is able to eat without difficulty.  Tolerating medicine. Principal Problem: Severe recurrent major depression without psychotic features ([place]) Diagnosis: Principal Problem:   Severe recurrent major depression without psychotic features ([place]) Active Problems:   Squamous cell carcinoma of oropharynx (HCC)   Cocaine abuse ([place])   Alcohol abuse  Total Time spent with patient: 30 minutes  Past Psychiatric History: Past history of substance abuse longstanding mood instability difficulty maintaining stability because of substance use problems.  See previous  Past Medical History:  Past Medical History:  Diagnosis Date  . 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 PORT-A-CATH REMOVAL N/A 02/04/2021   Procedure: REMOVAL PORT-A-CATH;  Surgeon: Olean Ree, MD;  Location: [facility] ORS;  Service: General;  Laterality: N/A;  . PORTA CATH INSERTION N/A 10/25/2020   Procedure: PORTA CATH INSERTION;  Surgeon: Algernon Huxley, MD;  Location: [place] CV [facility];  Service: Cardiovascular;  Laterality: N/A;  . REMOVAL OF GASTROSTOMY TUBE N/A 02/04/2021   Procedure: REMOVAL OF GASTROSTOMY TUBE;  Surgeon: Olean Ree, MD;  Location: [facility] ORS;  Service: General;  Laterality: N/A;   Family History:  Family History  Problem Relation Age of Onset  . Cancer Father    Family Psychiatric  History: See previous Social History:  Social History   Substance and Sexual Activity  Alcohol Use Yes   Comment: 12 beers a day      Social History   Substance and Sexual Activity  Drug Use Yes  . Types: Marijuana, "Crack" cocaine    Social History   Socioeconomic History  . Marital status: Single    Spouse name: Not on file  . Number of children: Not on file  . Years of education: Not on file  . Highest education level: Not on file  Occupational History  . Not on file  Tobacco Use  . Smoking status: Current Every Day Smoker    Packs/day: 0.50  . Smokeless tobacco: Never Used  Vaping Use  . Vaping Use: Never used  Substance and Sexual Activity  . Alcohol use: Yes    Comment: 12 beers a day  . Drug use: Yes    Types: Marijuana, "Crack" cocaine  . Sexual activity: Not Currently  Other Topics Concern  . Not on file  Social History Narrative  . Not on file   Social Determinants of Health   Financial Resource Strain: Not on file  Food Insecurity: Not on file  Transportation Needs: Not on file  Physical Activity: Not on file  Stress: Not on file  Social Connections: Not on file   Additional Social History:                         Sleep: Fair  Appetite:  Fair  Current Medications: Current Facility-Administered Medications  Medication Dose Route Frequency Provider Last Rate Last Admin  . acetaminophen (TYLENOL) tablet 325 mg  325 mg Oral Q6H PRN Lu Duffel, RPH      .  acetaminophen (TYLENOL) tablet 650 mg  650 mg Oral Q6H PRN Aviendha Azbell, Madie Reno, MD   650 mg at 02/09/21 2121  . alum & mag hydroxide-simeth (MAALOX/MYLANTA) 200-200-20 MG/5ML suspension 30 mL  30 mL Oral Q4H PRN Marcianna Daily T, MD      . ARIPiprazole (ABILIFY) tablet 5 mg  5 mg Oral Daily Salley Scarlet, MD   5 mg at 02/14/21 0813  . escitalopram (LEXAPRO) tablet 10 mg  10 mg Oral Daily Danzell Birky, Madie Reno, MD   10 mg at 02/14/21 0813  . feeding supplement (ENSURE ENLIVE / ENSURE PLUS) liquid 237 mL  237 mL Oral TID BM Johnda Billiot T, MD   237 mL at 02/13/21 2035  . hydrOXYzine (ATARAX/VISTARIL) tablet 50 mg  50 mg  Oral TID PRN Salley Scarlet, MD   50 mg at 02/14/21 1660  . hydrOXYzine (ATARAX/VISTARIL) tablet 50 mg  50 mg Oral QHS Salley Scarlet, MD   50 mg at 02/13/21 2105  . magnesium hydroxide (MILK OF MAGNESIA) suspension 30 mL  30 mL Oral Daily PRN Adleigh Mcmasters T, MD      . menthol-cetylpyridinium (CEPACOL) lozenge 3 mg  1 lozenge Oral Q3H PRN Salley Scarlet, MD   3 mg at 02/14/21 6301  . multivitamin with minerals tablet 1 tablet  1 tablet Oral Daily Kimetha Trulson, Madie Reno, MD   1 tablet at 02/14/21 0813  . neomycin-bacitracin-polymyxin (NEOSPORIN) ointment   Topical Daily Krystine Pabst, Madie Reno, MD   Given at 02/13/21 [phone]  . nicotine (NICODERM CQ - dosed in mg/24 hours) patch 14 mg  14 mg Transdermal Daily Sarika Baldini, Madie Reno, MD   14 mg at 02/11/21 0806  . pantoprazole (PROTONIX) EC tablet 20 mg  20 mg Oral Daily Caron Tardif, Madie Reno, MD   20 mg at 02/14/21 0816  . traZODone (DESYREL) tablet 150 mg  150 mg Oral QHS Salley Scarlet, MD   150 mg at 02/13/21 2105    Lab Results: No results found for this or any previous visit (from the past 48 hour(s)).  Blood Alcohol level:  Lab Results  Component Value Date   ETH <10 01/28/2021   ETH 222 (H) 93/23/5573    Metabolic Disorder Labs: No results found for: HGBA1C, MPG No results found for: PROLACTIN No results found for: CHOL, TRIG, HDL, CHOLHDL, VLDL, LDLCALC  Physical Findings: AIMS:  , ,  ,  ,    CIWA:    COWS:     Musculoskeletal: Strength & Muscle Tone: within normal limits Gait & Station: normal Patient leans: N/A  Psychiatric Specialty Exam:  Presentation  General Appearance: Casual  Eye Contact:Good  Speech:Normal Rate  Speech Volume:Normal  Handedness:Right   Mood and Affect  Mood:Anxious; Depressed  Affect:Congruent   Thought Process  Thought Processes:Goal Directed  Descriptions of Associations:Intact  Orientation:Full (Time, Place and Person)  Thought Content:Logical  History of Schizophrenia/Schizoaffective  disorder:No  Duration of Psychotic Symptoms:No data recorded Hallucinations:No data recorded Ideas of Reference:None  Suicidal Thoughts:No data recorded Homicidal Thoughts:No data recorded  Sensorium  Memory:Immediate Good; Recent Good; Remote Good  Judgment:Intact  Insight:Fair   Executive Functions  Concentration:Good  Attention Span:Good  [place] of Knowledge:Good  Language:Good   Psychomotor Activity  Psychomotor Activity:No data recorded  Assets  Assets:Communication Skills; Desire for Improvement; Financial Resources/Insurance; Social Support; Talents/Skills   Sleep  Sleep:No data recorded   Physical Exam: Physical Exam Vitals and nursing note reviewed.  Constitutional:  Appearance: Normal appearance.  HENT:     Head: Normocephalic and atraumatic.     Mouth/Throat:     Pharynx: Oropharynx is clear.  Eyes:     Pupils: Pupils are equal, round, and reactive to light.  Cardiovascular:     Rate and Rhythm: Normal rate and regular rhythm.  Pulmonary:     Effort: Pulmonary effort is normal.     Breath sounds: Normal breath sounds.  Abdominal:     General: Abdomen is flat.     Palpations: Abdomen is soft.  Musculoskeletal:        General: Normal range of motion.  Skin:    General: Skin is warm and dry.  Neurological:     General: No focal deficit present.     Mental Status: He is alert. Mental status is at baseline.  Psychiatric:        Mood and Affect: Mood normal.        Thought Content: Thought content normal.    Review of Systems  Constitutional: Negative.   HENT: Negative.   Eyes: Negative.   Respiratory: Negative.   Cardiovascular: Negative.   Gastrointestinal: Negative.   Musculoskeletal: Negative.   Skin: Negative.   Neurological: Negative.   Psychiatric/Behavioral: Negative.    Blood pressure (!) 129/95, pulse 89, temperature 98.6 F (37 C), temperature source Oral, resp. rate 18, height 5\' 6"  (1.676 m), weight  60.8 kg, SpO2 100 %. Body mass index is 21.63 kg/m.   Treatment Plan Summary: Plan No change to current medication orders.  Encourage patient in attending groups and staying in touch with social work.  Still has radiation at least on Wednesday after which we may be looking at shelter options.  Alethia Berthold, MD 02/14/2021, 3:35 PM

## 2021-02-14 NOTE — Progress Notes (Signed)
Recreation Therapy Notes  Date: 02/14/2021  Time: 10:00 am   Location: Craft room   Behavioral response: Appropriate  Intervention Topic: Self-esteem    Discussion/Intervention:  Group content today was focused on self-esteem. Patient defined self-esteem and where it comes form. The group described reasons self-esteem is important. Individuals stated things that impact self-esteem and positive ways to improve self-esteem. The group participated in the intervention "Collage of Me" where patients were able to create a collage of positive things that makes them who they are. Clinical Observations/Feedback: Patient came to group and expressed that self-esteem is important because it affects your mood and your company. He stated that your significant other and family can affect your self-esteem in many ways. Individual was social with staff and peers while participating in the intervention. Katlynne Mckercher LRT/CTRS          Jill Ruppe 02/14/2021 12:36 PM

## 2021-02-14 NOTE — [facility] Group Notes (Signed)
LCSW Group Therapy Note   02/14/2021 2:26 PM  Type of Therapy and Topic:  Group Therapy:  Overcoming Obstacles   Participation Level:  Active   Description of Group:    In this group patients will be encouraged to explore what they see as obstacles to their own wellness and recovery. They will be guided to discuss their thoughts, feelings, and behaviors related to these obstacles. The group will process together ways to cope with barriers, with attention given to specific choices patients can make. Each patient will be challenged to identify changes they are motivated to make in order to overcome their obstacles. This group will be process-oriented, with patients participating in exploration of their own experiences as well as giving and receiving support and challenge from other group members.   Therapeutic Goals: 1. Patient will identify personal and current obstacles as they relate to admission. 2. Patient will identify barriers that currently interfere with their wellness or overcoming obstacles.  3. Patient will identify feelings, thought process and behaviors related to these barriers. 4. Patient will identify two changes they are willing to make to overcome these obstacles:      Summary of Patient Progress Patient was present for the entirety of the group session. Patient was an active listener and participated in the topic of discussion, provided helpful advice to others, and added nuance to topic of conversation. Patient identified and shared some obstacles he has encountered while looking for substance use treatment.      Therapeutic Modalities:   Cognitive Behavioral Therapy Solution Focused Therapy Motivational Interviewing Relapse Prevention Therapy  Paulla Dolly, MSW, [place], [place] 02/14/2021 2:26 PM

## 2021-02-14 NOTE — Progress Notes (Signed)
No issues to report. Pleasant and easy to engage in conversation. Reports sleeping through the night and is happy with current QHS medication regimen. Denies SI HI AVH depression and pain. Continues to endorse mild anxiety, but states that he is able to manage his anxiety level. He is med complaint and tolerates his meds without incident.  Continues to monitored with Q15 minute rounds and was encouraged to come to staff with any concerns.   Cleo Butler-Nicholson, LPN

## 2021-02-14 NOTE — Plan of Care (Signed)
Patient pleasant and cooperative on approach. Appropriate with staff & peers. Patient rated his depression and anxiety 0/10. Patient states his goal is " forgiving myself ,pray and talk to my family." Denies SI,HI and AVH. Appetite and energy level good. Patient had radiation treatment today. Support and encouragement given.

## 2021-02-15 ENCOUNTER — Ambulatory Visit: Payer: Medicaid Other

## 2021-02-15 DIAGNOSIS — Z510 Encounter for antineoplastic radiation therapy: Secondary | ICD-10-CM | POA: Diagnosis not present

## 2021-02-15 DIAGNOSIS — F332 Major depressive disorder, recurrent severe without psychotic features: Secondary | ICD-10-CM | POA: Diagnosis not present

## 2021-02-15 NOTE — Progress Notes (Signed)
Patient is baseline. He is pleasant and cooperative. He is med complainant and received his QHS medication without incident. Continues to have some mild but manageable anxiety, denies everything else. Ready for radiation Tx to be ended so that he can start SA Tx. Continues to  have complaint of mild throat irritation.  Continues to be monitored with 25 minute safety rounds.  Encouraged to come to staff with any concerns.    Cleo Butler-Nicholson, LPN

## 2021-02-15 NOTE — Plan of Care (Signed)
  Problem: Education: Goal: Knowledge of the prescribed therapeutic regimen will improve Outcome: Progressing   Problem: Activity: Goal: Interest or engagement in leisure activities will improve Outcome: Progressing Goal: Imbalance in normal sleep/wake cycle will improve Outcome: Progressing   Problem: Coping: Goal: Coping ability will improve Outcome: Progressing Goal: Will verbalize feelings Outcome: Progressing   Problem: Health Behavior/Discharge Planning: Goal: Ability to make decisions will improve Outcome: Progressing Goal: Compliance with therapeutic regimen will improve Outcome: Progressing

## 2021-02-15 NOTE — Plan of Care (Signed)
  Problem: Substance Use/Abuse Goal: STG - Patient will identify 3 social activities in an alcohol/drug, free-environment within 5 recreation therapy group sessions Description: STG - Patient will identify 3 social activities in an alcohol/drug, free-environment within 5 recreation therapy group sessions Outcome: Progressing

## 2021-02-15 NOTE — Progress Notes (Signed)
Recreation Therapy Notes   Date: 02/15/2021  Time: 9:30 am   Location: Courtyard   Behavioral response: Appropriate  Intervention Topic: Social- skills    Discussion/Intervention:  Group content on today was focused on social skills. The group defined social skills and identified ways they use social skills. Patients expressed what obstacles they face when trying to be social. Participants described the importance of social skills. The group listed ways to improve social skills and reasons to improve social skills. Individuals had an opportunity to learn new and improve social skills as well as identify their weaknesses. Clinical Observations/Feedback: Patient came to group and defined social skills as conversing with others. He explained that being courteous to others is how he normally socializes. Individual was social with staff and peers while participating in the intervention. Ashlie Mcmenamy LRT/CTRS          Kymoni Monday 02/15/2021 11:10 AM

## 2021-02-15 NOTE — Progress Notes (Signed)
[facility] MD Progress Note  02/15/2021 5:00 PM Paul Hebert  MRN:  [id number] Subjective: Follow-up for patient with depression and substance abuse problems.  Continues to report that his mood is much improved.  No suicidal thoughts.  No hallucinations.  Patient is keeping himself neat and clean and participates appropriately on the unit. Principal Problem: Severe recurrent major depression without psychotic features ([place]) Diagnosis: Principal Problem:   Severe recurrent major depression without psychotic features ([place]) Active Problems:   Squamous cell carcinoma of oropharynx (HCC)   Cocaine abuse ([place])   Alcohol abuse  Total Time spent with patient: 30 minutes  Past Psychiatric History: Past history of substance abuse problems and depression with suicidal ideation  Past Medical History:  Past Medical History:  Diagnosis Date  . 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 PORT-A-CATH REMOVAL N/A 02/04/2021   Procedure: REMOVAL PORT-A-CATH;  Surgeon: Olean Ree, MD;  Location: [facility] ORS;  Service: General;  Laterality: N/A;  . PORTA CATH INSERTION N/A 10/25/2020   Procedure: PORTA CATH INSERTION;  Surgeon: Algernon Huxley, MD;  Location: [place] [facility];  Service: Cardiovascular;  Laterality: N/A;  . REMOVAL OF GASTROSTOMY TUBE N/A 02/04/2021   Procedure: REMOVAL OF GASTROSTOMY TUBE;  Surgeon: Olean Ree, MD;  Location: [facility] ORS;  Service: General;  Laterality: N/A;   Family History:  Family History  Problem Relation Age of Onset  . Cancer Father    Family Psychiatric  History: See previous Social History:  Social History   Substance and Sexual Activity  Alcohol Use Yes   Comment: 12 beers a day     Social History   Substance and Sexual Activity  Drug Use Yes  . Types: Marijuana, "Crack" cocaine    Social History   Socioeconomic History  . Marital status: Single    Spouse name: Not on file  . Number of children: Not on file  .  Years of education: Not on file  . Highest education level: Not on file  Occupational History  . Not on file  Tobacco Use  . Smoking status: Current Every Day Smoker    Packs/day: 0.50  . Smokeless tobacco: Never Used  Vaping Use  . Vaping Use: Never used  Substance and Sexual Activity  . Alcohol use: Yes    Comment: 12 beers a day  . Drug use: Yes    Types: Marijuana, "Crack" cocaine  . Sexual activity: Not Currently  Other Topics Concern  . Not on file  Social History Narrative  . Not on file   Social Determinants of Health   Financial Resource Strain: Not on file  Food Insecurity: Not on file  Transportation Needs: Not on file  Physical Activity: Not on file  Stress: Not on file  Social Connections: Not on file   Additional Social History:                         Sleep: Fair  Appetite:  Fair  Current Medications: Current Facility-Administered Medications  Medication Dose Route Frequency Provider Last Rate Last Admin  . acetaminophen (TYLENOL) tablet 325 mg  325 mg Oral Q6H PRN Shanlever, Pierce Crane, RPH      . acetaminophen (TYLENOL) tablet 650 mg  650 mg Oral Q6H PRN Edra Riccardi, Madie Reno, MD   650 mg at 02/09/21 2121  . alum & mag hydroxide-simeth (MAALOX/MYLANTA) 200-200-20 MG/5ML suspension 30 mL  30 mL Oral  Q4H PRN Galina Haddox T, MD      . ARIPiprazole (ABILIFY) tablet 5 mg  5 mg Oral Daily Salley Scarlet, MD   5 mg at 02/15/21 0813  . escitalopram (LEXAPRO) tablet 10 mg  10 mg Oral Daily Gabriellah Rabel, Madie Reno, MD   10 mg at 02/15/21 2542  . feeding supplement (ENSURE ENLIVE / ENSURE PLUS) liquid 237 mL  237 mL Oral TID BM Evania Lyne T, MD   237 mL at 02/15/21 1401  . hydrOXYzine (ATARAX/VISTARIL) tablet 50 mg  50 mg Oral TID PRN Salley Scarlet, MD   50 mg at 02/15/21 0813  . hydrOXYzine (ATARAX/VISTARIL) tablet 50 mg  50 mg Oral QHS Salley Scarlet, MD   50 mg at 02/14/21 2104  . magnesium hydroxide (MILK OF MAGNESIA) suspension 30 mL  30 mL Oral Daily  PRN Barbette Mcglaun T, MD      . menthol-cetylpyridinium (CEPACOL) lozenge 3 mg  1 lozenge Oral Q3H PRN Salley Scarlet, MD   3 mg at 02/14/21 7062  . multivitamin with minerals tablet 1 tablet  1 tablet Oral Daily Rayan Dyal, Madie Reno, MD   1 tablet at 02/15/21 3762  . neomycin-bacitracin-polymyxin (NEOSPORIN) ointment   Topical Daily Fitzpatrick Alberico, Madie Reno, MD   Given at 02/15/21 [phone]  . nicotine (NICODERM CQ - dosed in mg/24 hours) patch 14 mg  14 mg Transdermal Daily Nkenge Sonntag, Madie Reno, MD   14 mg at 02/11/21 0806  . pantoprazole (PROTONIX) EC tablet 20 mg  20 mg Oral Daily Essa Malachi, Madie Reno, MD   20 mg at 02/15/21 0814  . traZODone (DESYREL) tablet 150 mg  150 mg Oral QHS Salley Scarlet, MD   150 mg at 02/14/21 2103    Lab Results: No results found for this or any previous visit (from the past 48 hour(s)).  Blood Alcohol level:  Lab Results  Component Value Date   ETH <10 01/28/2021   ETH 222 (H) 17/61/6073    Metabolic Disorder Labs: No results found for: HGBA1C, MPG No results found for: PROLACTIN No results found for: CHOL, TRIG, HDL, CHOLHDL, VLDL, LDLCALC  Physical Findings: AIMS:  , ,  ,  ,    CIWA:    COWS:     Musculoskeletal: Strength & Muscle Tone: within normal limits Gait & Station: normal Patient leans: N/A  Psychiatric Specialty Exam:  Presentation  General Appearance: Casual  Eye Contact:Good  Speech:Normal Rate  Speech Volume:Normal  Handedness:Right   Mood and Affect  Mood:Anxious; Depressed  Affect:Congruent   Thought Process  Thought Processes:Goal Directed  Descriptions of Associations:Intact  Orientation:Full (Time, Place and Person)  Thought Content:Logical  History of Schizophrenia/Schizoaffective disorder:No  Duration of Psychotic Symptoms:No data recorded Hallucinations:No data recorded Ideas of Reference:None  Suicidal Thoughts:No data recorded Homicidal Thoughts:No data recorded  Sensorium  Memory:Immediate Good; Recent Good;  Remote Good  Judgment:Intact  Insight:Fair   Executive Functions  Concentration:Good  Attention Span:Good  [place] of Knowledge:Good  Language:Good   Psychomotor Activity  Psychomotor Activity:No data recorded  Assets  Assets:Communication Skills; Desire for Improvement; Financial Resources/Insurance; Social Support; Talents/Skills   Sleep  Sleep:No data recorded   Physical Exam: Physical Exam Vitals and nursing note reviewed.  Constitutional:      Appearance: Normal appearance.  HENT:     Head: Normocephalic and atraumatic.     Mouth/Throat:     Pharynx: Oropharynx is clear.  Eyes:     Pupils: Pupils are equal, round,  and reactive to light.  Cardiovascular:     Rate and Rhythm: Normal rate and regular rhythm.  Pulmonary:     Effort: Pulmonary effort is normal.     Breath sounds: Normal breath sounds.  Abdominal:     General: Abdomen is flat.     Palpations: Abdomen is soft.  Musculoskeletal:        General: Normal range of motion.  Skin:    General: Skin is warm and dry.  Neurological:     General: No focal deficit present.     Mental Status: He is alert. Mental status is at baseline.  Psychiatric:        Mood and Affect: Mood normal.        Thought Content: Thought content normal.    Review of Systems  Constitutional: Negative.   HENT: Negative.   Eyes: Negative.   Respiratory: Negative.   Cardiovascular: Negative.   Gastrointestinal: Negative.   Musculoskeletal: Negative.   Skin: Negative.   Neurological: Negative.   Psychiatric/Behavioral: Negative.    Blood pressure (!) 125/92, pulse 81, temperature 98.4 F (36.9 C), temperature source Oral, resp. rate 18, height 5\' 6"  (1.676 m), weight 60.8 kg, SpO2 100 %. Body mass index is 21.63 kg/m.   Treatment Plan Summary: Plan Patient has another radiation treatment scheduled for tomorrow after which we will be looking at discharge.  Does not sound like rehab facilities are  available so we may be looking at the rescue mission.  No change for now to current treatment plan.  Supportive counseling done.  Alethia Berthold, MD 02/15/2021, 5:00 PM

## 2021-02-15 NOTE — Plan of Care (Signed)

## 2021-02-15 NOTE — [facility] Group Notes (Signed)
LCSW Group Therapy Note     02/15/2021 2:33 PM     Type of Therapy/Topic:  Group Therapy:  Feelings about Diagnosis     Participation Level:  Active     Description of Group:   This group will allow patients to explore their thoughts and feelings about diagnoses they have received. Patients will be guided to explore their level of understanding and acceptance of these diagnoses. Facilitator will encourage patients to process their thoughts and feelings about the reactions of others to their diagnosis and will guide patients in identifying ways to discuss their diagnosis with significant others in their lives. This group will be process-oriented, with patients participating in exploration of their own experiences, giving and receiving support, and processing challenge from other group members.        Therapeutic Goals:  1.    Patient will demonstrate understanding of diagnosis as evidenced by identifying two or more symptoms of the disorder  2.    Patient will be able to express two feelings regarding the diagnosis  3.    Patient will demonstrate their ability to communicate their needs through discussion and/or role play     Summary of Patient Progress: Patient was present for the entirety of the group session. Patient was an active listener and participated in the topic of discussion, provided helpful advice to others, and added nuance to topic of conversation. Pt discussed the people in his life that are supportive. He stated that he originally was resistant to his diagnosis and did not believe it, but now he is accepting. He talked about how he has a morning ritual of coffee and listening to the birds and how that is a  positive coping measure.   Therapeutic Modalities:   Cognitive Behavioral Therapy  Brief Therapy  Feelings Identification    Ioana Louks [place], MSW, LCSW-A  02/15/2021 2:33 PM

## 2021-02-15 NOTE — Progress Notes (Signed)
Paul Hebert appears pleasant and calm. He complained of throat irritation and some anxiety 4/10. He was given a lozenge this morning and his anxiety is relieved with vistaril. He is in good spirits and anticipates discharging. His goals for today include "making plenty of phone calls and to network with staff to find housing." Will continue to monitor with 15 minute safety checks.

## 2021-02-16 ENCOUNTER — Ambulatory Visit: Payer: Medicaid Other

## 2021-02-16 ENCOUNTER — Other Ambulatory Visit: Payer: Self-pay

## 2021-02-16 DIAGNOSIS — Z510 Encounter for antineoplastic radiation therapy: Secondary | ICD-10-CM | POA: Diagnosis not present

## 2021-02-16 DIAGNOSIS — F332 Major depressive disorder, recurrent severe without psychotic features: Secondary | ICD-10-CM | POA: Diagnosis not present

## 2021-02-16 MED ORDER — ARIPIPRAZOLE 5 MG PO TABS: 5.0000 mg | ORAL_TABLET | Freq: Every day | ORAL | 1 refills | Status: DC

## 2021-02-16 MED ORDER — ESCITALOPRAM OXALATE 10 MG PO TABS: 10.0000 mg | ORAL_TABLET | Freq: Every day | ORAL | 1 refills | Status: DC

## 2021-02-16 MED ORDER — ARIPIPRAZOLE 5 MG PO TABS
5.0000 mg | ORAL_TABLET | Freq: Every day | ORAL | 0 refills | Status: DC
  Filled 2021-02-16: qty 30, 30d supply, fill #0

## 2021-02-16 MED ORDER — ESCITALOPRAM OXALATE 10 MG PO TABS
10.0000 mg | ORAL_TABLET | Freq: Every day | ORAL | 0 refills | Status: DC
  Filled 2021-02-16: qty 30, 30d supply, fill #0

## 2021-02-16 MED ORDER — PANTOPRAZOLE SODIUM 20 MG PO TBEC
20.0000 mg | DELAYED_RELEASE_TABLET | Freq: Every day | ORAL | 0 refills | Status: DC
  Filled 2021-02-16: qty 30, 30d supply, fill #0

## 2021-02-16 MED ORDER — TRAZODONE HCL 150 MG PO TABS
150.0000 mg | ORAL_TABLET | Freq: Every day | ORAL | 0 refills | Status: DC
  Filled 2021-02-16: qty 30, 30d supply, fill #0

## 2021-02-16 MED ORDER — PANTOPRAZOLE SODIUM 20 MG PO TBEC: 20.0000 mg | DELAYED_RELEASE_TABLET | Freq: Every day | ORAL | 1 refills | Status: DC

## 2021-02-16 MED ORDER — TRAZODONE HCL 150 MG PO TABS: 150.0000 mg | ORAL_TABLET | Freq: Every day | ORAL | 1 refills | Status: DC

## 2021-02-16 NOTE — Progress Notes (Signed)
Recreation Therapy Notes   Date: 02/16/2021  Time: 10:00 am   Location: Courtyard   Behavioral response: Appropriate  Intervention Topic: Leisure   Discussion/Intervention:  Group content today was focused on leisure. The group defined what leisure is and some positive leisure activities they participate in. Individuals identified the difference between good and bad leisure. Participants expressed how they feel after participating in the leisure of their choice. The group discussed how they go about picking a leisure activity and if others are involved in their leisure activities. The patient stated how many leisure activities they have to choose from and reasons why it is important to have leisure time. Individuals participated in the intervention "Exploration of Leisure" where they had a chance to identify new leisure activities as well as benefits of leisure. Clinical Observations/Feedback: Patient came to group and was focused on what peers and staff had to say. He identified being outside and music as leisure activities he enjoys.  Individual was social with staff and peers while participating in the intervention. Paul Hebert LRT/CTRS         Sendy Pluta 02/16/2021 12:00 PM

## 2021-02-16 NOTE — [facility] Group Notes (Signed)
LCSW Group Therapy Note  02/16/2021 2:24 PM  Type of Therapy/Topic:  Group Therapy:  Emotion Regulation  Participation Level:  Active   Description of Group:   The purpose of this group is to assist patients in learning to regulate negative emotions and experience positive emotions. Patients will be guided to discuss ways in which they have been vulnerable to their negative emotions. These vulnerabilities will be juxtaposed with experiences of positive emotions or situations, and patients will be challenged to use positive emotions to combat negative ones. Special emphasis will be placed on coping with negative emotions in conflict situations, and patients will process healthy conflict resolution skills.  Therapeutic Goals: 1. Patient will identify two positive emotions or experiences to reflect on in order to balance out negative emotions 2. Patient will label two or more emotions that they find the most difficult to experience 3. Patient will demonstrate positive conflict resolution skills through discussion and/or role plays  Summary of Patient Progress: Patient was present for the entirety of the group. His comments and feedback were pertinent to the discussion. He identified anger as a difficult emotion to address and spoke about his relationship as a trigger for strong emotions. He was able to identify positive ways to deal with these emotions and also examine his negative reactions to negative emotions as well.   Therapeutic Modalities:   Cognitive Behavioral Therapy Feelings Identification Dialectical Behavioral Therapy  Paul Hebert. Paul Hebert, MSW, [place], [place] 02/16/2021 2:24 PM

## 2021-02-16 NOTE — Tx Team (Signed)
Interdisciplinary Treatment and Diagnostic Plan Update  02/16/2021 Time of Session: 0830 AM [name] MRN: [id number]  Principal Diagnosis: Severe recurrent major depression without psychotic features [facility])  Secondary Diagnoses: Principal Problem:   Severe recurrent major depression without psychotic features ([place]) Active Problems:   Squamous cell carcinoma of oropharynx (HCC)   Cocaine abuse ([place])   Alcohol abuse   Current Medications:  Current Facility-Administered Medications  Medication Dose Route Frequency Provider Last Rate Last Admin  . acetaminophen (TYLENOL) tablet 325 mg  325 mg Oral Q6H PRN Shanlever, Pierce Crane, RPH      . acetaminophen (TYLENOL) tablet 650 mg  650 mg Oral Q6H PRN Clapacs, Madie Reno, MD   650 mg at 02/09/21 2121  . alum & mag hydroxide-simeth (MAALOX/MYLANTA) 200-200-20 MG/5ML suspension 30 mL  30 mL Oral Q4H PRN Clapacs, John T, MD      . ARIPiprazole (ABILIFY) tablet 5 mg  5 mg Oral Daily Salley Scarlet, MD   5 mg at 02/16/21 0754  . escitalopram (LEXAPRO) tablet 10 mg  10 mg Oral Daily Clapacs, Madie Reno, MD   10 mg at 02/16/21 0755  . feeding supplement (ENSURE ENLIVE / ENSURE PLUS) liquid 237 mL  237 mL Oral TID BM Clapacs, Madie Reno, MD   237 mL at 02/15/21 2101  . hydrOXYzine (ATARAX/VISTARIL) tablet 50 mg  50 mg Oral TID PRN Salley Scarlet, MD   50 mg at 02/16/21 0755  . hydrOXYzine (ATARAX/VISTARIL) tablet 50 mg  50 mg Oral QHS Salley Scarlet, MD   50 mg at 02/15/21 2100  . magnesium hydroxide (MILK OF MAGNESIA) suspension 30 mL  30 mL Oral Daily PRN Clapacs, John T, MD      . menthol-cetylpyridinium (CEPACOL) lozenge 3 mg  1 lozenge Oral Q3H PRN Salley Scarlet, MD   3 mg at 02/16/21 0755  . multivitamin with minerals tablet 1 tablet  1 tablet Oral Daily Clapacs, Madie Reno, MD   1 tablet at 02/16/21 0754  . neomycin-bacitracin-polymyxin (NEOSPORIN) ointment   Topical Daily Clapacs, Madie Reno, MD   1 application at 40/98/11 0756  . nicotine (NICODERM CQ  - dosed in mg/24 hours) patch 14 mg  14 mg Transdermal Daily Clapacs, Madie Reno, MD   14 mg at 02/11/21 0806  . pantoprazole (PROTONIX) EC tablet 20 mg  20 mg Oral Daily Clapacs, Madie Reno, MD   20 mg at 02/15/21 0814  . traZODone (DESYREL) tablet 150 mg  150 mg Oral QHS Salley Scarlet, MD   150 mg at 02/15/21 2100   PTA Medications: Medications Prior to Admission  Medication Sig Dispense Refill Last Dose  . amoxicillin-clavulanate (AUGMENTIN) 875-125 MG tablet Take 1 tablet by mouth 2 (two) times daily. 14 tablet 0   . COVID-19 mRNA vaccine, Pfizer, 30 MCG/0.3ML injection AS DIRECTED .3 mL 0   . dexamethasone (DECADRON) 4 MG tablet Take 2 tablets (8 mg total) by mouth daily. Take daily x 3 days starting the day after cisplatin chemotherapy. Take with food. (Patient taking differently: Take 8 mg by mouth See admin instructions. Take 8 mg daily x 3 days starting the day after cisplatin chemotherapy. Take with food.) 30 tablet 1   . escitalopram (LEXAPRO) 10 MG tablet Take 1 tablet (10 mg total) by mouth daily. Take half pill per day for 5 days then increase to one pill per day 30 tablet 1   . fentaNYL (DURAGESIC) 12 MCG/HR Place 1 patch onto the  skin every 3 (three) days. This is additonal to the 25 mcg to make 37 mcg total 10 patch 0   . fentaNYL (DURAGESIC) 25 MCG/HR Place 1 patch onto the skin every 3 (three) days. This is additonal to the12 mcg to make 37 mcg total 10 patch 0   . fluconazole (DIFLUCAN) 100 MG tablet Take 1 tablet (100 mg total) by mouth daily. 7 tablet 0   . gabapentin (NEURONTIN) 300 MG capsule TAKE 1 CAPSULE (300 MG) BY MOUTH 3 TIMESDAILY 90 capsule 2   . lidocaine-prilocaine (EMLA) cream Apply to affected area once (Patient taking differently: Apply 1 application topically daily as needed (port access).) 30 g 3   . loperamide (IMODIUM) 2 MG capsule Take 1 capsule (2 mg total) by mouth as needed for diarrhea or loose stools (take 2 tab with first loose stool, then 1 tab. after every  loose stool after that and maximum 8 a day). 48 capsule 0   . LORazepam (ATIVAN) 0.5 MG tablet Take 1 tablet (0.5 mg total) by mouth every 8 (eight) hours. 60 tablet 0   . Nutritional Supplements (FEEDING SUPPLEMENT, OSMOLITE 1.5 CAL,) LIQD Give 1 1/2 cartons of formula 4 times per day via feeding tube (8am, noon, 4pm and 8pm). Flush with 7m of water before and after each feeding. Drink or give additional 2425mwater TID between feedings via tube for better hydration. 1422 mL 0   . nystatin (MYCOSTATIN) 100000 UNIT/ML suspension Take 5 mLs (500,000 Units total) by mouth 4 (four) times daily. 473 mL 1   . ondansetron (ZOFRAN) 8 MG tablet Take 1 tablet (8 mg total) by mouth 2 (two) times daily as needed. Start on the third day after cisplatin chemotherapy. 30 tablet 1   . Oxycodone HCl 20 MG TABS Take 1 tablet (20 mg total) by mouth every 4 (four) hours as needed. 180 tablet 0   . pantoprazole (PROTONIX) 20 MG tablet TAKE 1 TABLET BY MOUTH DAILY. 30 tablet 1   . prochlorperazine (COMPAZINE) 10 MG tablet Take 1 tablet (10 mg total) by mouth every 6 (six) hours as needed (Nausea or vomiting). 30 tablet 1   . sertraline (ZOLOFT) 50 MG tablet Take 1 tablet (50 mg total) by mouth daily. (Patient not taking: No sig reported) 30 tablet 2     Patient Stressors:    Patient Strengths:    Treatment Modalities: Medication Management, Group therapy, Case management,  1 to 1 session with clinician, Psychoeducation, Recreational therapy.   Physician Treatment Plan for Primary Diagnosis: Severe recurrent major depression without psychotic features ([place]): Improvement in symptoms so as ready for discharge Improvement in symptoms so as ready for discharge   Short Term Goals: Ability to verbalize feelings will improve Ability to identify and develop effective coping behaviors will improve  Medication Management: Evaluate patient's response, side effects, and tolerance of medication  regimen.  Therapeutic Interventions: 1 to 1 sessions, Unit Group sessions and Medication administration.  Evaluation of Outcomes: Met  Physician Treatment Plan for Secondary Diagnosis: Principal Problem:   Severe recurrent major depression without psychotic features ([place] Problems:   Squamous cell carcinoma of oropharynx (HCC)   Cocaine abuse ([place]  Alcohol abuse  Long Term Goal(s): Improvement in symptoms so as ready for discharge Improvement in symptoms so as ready for discharge   Short Term Goals: Ability to verbalize feelings will improve Ability to identify and develop effective coping behaviors will improve     Medication  Management: Evaluate patient's response, side effects, and tolerance of medication regimen.  Therapeutic Interventions: 1 to 1 sessions, Unit Group sessions and Medication administration.  Evaluation of Outcomes: Met   RN Treatment Plan for Primary Diagnosis: Severe recurrent major depression without psychotic features ([place]) Long Term Goal(s): Knowledge of disease and therapeutic regimen to maintain health will improve  Short Term Goals: Ability to verbalize frustration and anger appropriately will improve, Ability to demonstrate self-control, Ability to participate in decision making will improve, Ability to verbalize feelings will improve, Ability to disclose and discuss suicidal ideas and Ability to identify and develop effective coping behaviors will improve  Medication Management: RN will administer medications as ordered by provider, will assess and evaluate patient's response and provide education to patient for prescribed medication. RN will report any adverse and/or side effects to prescribing provider.  Therapeutic Interventions: 1 on 1 counseling sessions, Psychoeducation, Medication administration, Evaluate responses to treatment, Monitor vital signs and CBGs as ordered, Perform/monitor CIWA, COWS, AIMS and Fall Risk screenings as ordered,  Perform wound care treatments as ordered.  Evaluation of Outcomes: Met   LCSW Treatment Plan for Primary Diagnosis: Severe recurrent major depression without psychotic features ([place]) Long Term Goal(s): Safe transition to appropriate next level of care at discharge, Engage patient in therapeutic group addressing interpersonal concerns.  Short Term Goals: Engage patient in aftercare planning with referrals and resources, Increase social support, Increase ability to appropriately verbalize feelings, Increase emotional regulation, Facilitate acceptance of mental health diagnosis and concerns, Facilitate patient progression through stages of change regarding substance use diagnoses and concerns, Identify triggers associated with mental health/substance abuse issues and Increase skills for wellness and recovery  Therapeutic Interventions: Assess for all discharge needs, 1 to 1 time with Social worker, Explore available resources and support systems, Assess for adequacy in community support network, Educate family and significant other(s) on suicide prevention, Complete Psychosocial Assessment, Interpersonal group therapy.  Evaluation of Outcomes: Met   Progress in Treatment: Attending groups: Yes. Participating in groups: Yes. Taking medication as prescribed: Yes. Toleration medication: Yes. Family/Significant other contact made: Yes, individual(s) contacted:  pt's brother. Patient understands diagnosis: Yes. Discussing patient identified problems/goals with staff: Yes. Medical problems stabilized or resolved: Yes. Denies suicidal/homicidal ideation: Yes. Issues/concerns per patient self-inventory: No. Other: None  New problem(s) identified: No, Describe:  none Update 02/16/21: No change.   New Short Term/Long Term Goal(s): "to get into a treatment center" Update 02/16/21: No change.  Patient Goals:  Update 4/30 - "I am getting a little better sleep now and my goal is to get into a treatment  center". Update 02/16/21: No change.  Discharge Plan or Barriers: CSW will assist pt in obtaining transportation and follow-up care upon discharge. Update 02/16/21: Patient anticipated to discharge to [facility].   Reason for Continuation of Hospitalization: Depression Suicidal ideation Withdrawal symptoms  Estimated Length of Stay: 1-7 days  Attendees: Patient: Paul Hebert 02/16/2021 8:49 AM  Physician: Alethia Berthold, MD  02/16/2021 8:49 AM  Nursing:  02/16/2021 8:49 AM  RN Care Manager: 02/16/2021 8:49 AM  Social Worker: Paulla Dolly, MSW, [place], [place]  02/16/2021 8:49 AM  Recreational Therapist: Devin Going, LRT  02/16/2021 8:49 AM  Other: Michell Heinrich, MSW, LCSW, LCAS  02/16/2021 8:49 AM  Other: Kiva Martinique, [place] 02/16/2021 8:49 AM  Other: 02/16/2021 8:49 AM    Scribe for Treatment Team: Durenda Hurt, [place] 02/16/2021 8:49 AM

## 2021-02-16 NOTE — Plan of Care (Signed)
Pt denies depression, hopelessness, SI, HI and AVH. Pt rates anxiety 4/10 and received a PRN. Pt was educated on care plan and verbalizes understanding. Collier Bullock RN Problem: Education: Goal: Utilization of techniques to improve thought processes will improve Outcome: Progressing Goal: Knowledge of the prescribed therapeutic regimen will improve Outcome: Progressing   Problem: Activity: Goal: Interest or engagement in leisure activities will improve Outcome: Progressing Goal: Imbalance in normal sleep/wake cycle will improve Outcome: Progressing   Problem: Coping: Goal: Coping ability will improve Outcome: Progressing Goal: Will verbalize feelings Outcome: Progressing   Problem: Coping: Goal: Coping ability will improve Outcome: Progressing Goal: Will verbalize feelings Outcome: Progressing   Problem: Health Behavior/Discharge Planning: Goal: Ability to make decisions will improve Outcome: Progressing Goal: Compliance with therapeutic regimen will improve Outcome: Progressing   Problem: Role Relationship: Goal: Will demonstrate positive changes in social behaviors and relationships Outcome: Progressing   Problem: Safety: Goal: Ability to disclose and discuss suicidal ideas will improve Outcome: Progressing Goal: Ability to identify and utilize support systems that promote safety will improve Outcome: Progressing   Problem: Self-Concept: Goal: Will verbalize positive feelings about self Outcome: Progressing Goal: Level of anxiety will decrease Outcome: Progressing

## 2021-02-16 NOTE — Progress Notes (Signed)
[facility] MD Progress Note  02/16/2021 7:47 PM Paul Hebert  MRN:  [id number] Subjective: Follow-up for this 50 year old man with depression.  Mood stated as being good.  Fully recovered from depression.  Optimistic about the future.  No suicidal thoughts. Principal Problem: Severe recurrent major depression without psychotic features ([place]) Diagnosis: Principal Problem:   Severe recurrent major depression without psychotic features ([place]) Active Problems:   Squamous cell carcinoma of oropharynx (HCC)   Cocaine abuse ([place])   Alcohol abuse  Total Time spent with patient: 30 minutes  Past Psychiatric History: Past history of depression and cocaine abuse  Past Medical History:  Past Medical History:  Diagnosis Date  . 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 PORT-A-CATH REMOVAL N/A 02/04/2021   Procedure: REMOVAL PORT-A-CATH;  Surgeon: Olean Ree, MD;  Location: [facility] ORS;  Service: General;  Laterality: N/A;  . PORTA CATH INSERTION N/A 10/25/2020   Procedure: PORTA CATH INSERTION;  Surgeon: Algernon Huxley, MD;  Location: [place] CV LAB;  Service: Cardiovascular;  Laterality: N/A;  . REMOVAL OF GASTROSTOMY TUBE N/A 02/04/2021   Procedure: REMOVAL OF GASTROSTOMY TUBE;  Surgeon: Olean Ree, MD;  Location: [facility] ORS;  Service: General;  Laterality: N/A;   Family History:  Family History  Problem Relation Age of Onset  . Cancer Father    Family Psychiatric  History: See previous Social History:  Social History   Substance and Sexual Activity  Alcohol Use Yes   Comment: 12 beers a day     Social History   Substance and Sexual Activity  Drug Use Yes  . Types: Marijuana, "Crack" cocaine    Social History   Socioeconomic History  . Marital status: Single    Spouse name: Not on file  . Number of children: Not on file  . Years of education: Not on file  . Highest education level: Not on file  Occupational History  . Not on file  Tobacco  Use  . Smoking status: Current Every Day Smoker    Packs/day: 0.50  . Smokeless tobacco: Never Used  Vaping Use  . Vaping Use: Never used  Substance and Sexual Activity  . Alcohol use: Yes    Comment: 12 beers a day  . Drug use: Yes    Types: Marijuana, "Crack" cocaine  . Sexual activity: Not Currently  Other Topics Concern  . Not on file  Social History Narrative  . Not on file   Social Determinants of Health   Financial Resource Strain: Not on file  Food Insecurity: Not on file  Transportation Needs: Not on file  Physical Activity: Not on file  Stress: Not on file  Social Connections: Not on file   Additional Social History:                         Sleep: Fair  Appetite:  Fair  Current Medications: Current Facility-Administered Medications  Medication Dose Route Frequency Provider Last Rate Last Admin  . acetaminophen (TYLENOL) tablet 325 mg  325 mg Oral Q6H PRN Shanlever, Pierce Crane, RPH      . acetaminophen (TYLENOL) tablet 650 mg  650 mg Oral Q6H PRN Cleofas Hudgins, Madie Reno, MD   650 mg at 02/09/21 2121  . alum & mag hydroxide-simeth (MAALOX/MYLANTA) 200-200-20 MG/5ML suspension 30 mL  30 mL Oral Q4H PRN Aaren Atallah T, MD      . ARIPiprazole (ABILIFY) tablet 5 mg  5 mg Oral Daily Salley Scarlet, MD   5 mg at 02/16/21 0754  . escitalopram (LEXAPRO) tablet 10 mg  10 mg Oral Daily Rayshard Schirtzinger, Madie Reno, MD   10 mg at 02/16/21 0755  . feeding supplement (ENSURE ENLIVE / ENSURE PLUS) liquid 237 mL  237 mL Oral TID BM Orian Figueira T, MD   237 mL at 02/16/21 1400  . hydrOXYzine (ATARAX/VISTARIL) tablet 50 mg  50 mg Oral TID PRN Salley Scarlet, MD   50 mg at 02/16/21 0755  . hydrOXYzine (ATARAX/VISTARIL) tablet 50 mg  50 mg Oral QHS Salley Scarlet, MD   50 mg at 02/15/21 2100  . magnesium hydroxide (MILK OF MAGNESIA) suspension 30 mL  30 mL Oral Daily PRN Jeralynn Vaquera T, MD      . menthol-cetylpyridinium (CEPACOL) lozenge 3 mg  1 lozenge Oral Q3H PRN Salley Scarlet,  MD   3 mg at 02/16/21 0755  . multivitamin with minerals tablet 1 tablet  1 tablet Oral Daily Jamiracle Avants, Madie Reno, MD   1 tablet at 02/16/21 0754  . neomycin-bacitracin-polymyxin (NEOSPORIN) ointment   Topical Daily Clete Kuch, Madie Reno, MD   1 application at 26/37/85 0756  . nicotine (NICODERM CQ - dosed in mg/24 hours) patch 14 mg  14 mg Transdermal Daily Eldana Isip, Madie Reno, MD   14 mg at 02/11/21 0806  . pantoprazole (PROTONIX) EC tablet 20 mg  20 mg Oral Daily Juliet Vasbinder T, MD   20 mg at 02/16/21 1301  . traZODone (DESYREL) tablet 150 mg  150 mg Oral QHS Salley Scarlet, MD   150 mg at 02/15/21 2100    Lab Results: No results found for this or any previous visit (from the past 48 hour(s)).  Blood Alcohol level:  Lab Results  Component Value Date   ETH <10 01/28/2021   ETH 222 (H) 88/50/2774    Metabolic Disorder Labs: No results found for: HGBA1C, MPG No results found for: PROLACTIN No results found for: CHOL, TRIG, HDL, CHOLHDL, VLDL, LDLCALC  Physical Findings: AIMS:  , ,  ,  ,    CIWA:    COWS:     Musculoskeletal: Strength & Muscle Tone: within normal limits Gait & Station: normal Patient leans: N/A  Psychiatric Specialty Exam:  Presentation  General Appearance: Casual  Eye Contact:Good  Speech:Normal Rate  Speech Volume:Normal  Handedness:Right   Mood and Affect  Mood:Anxious; Depressed  Affect:Congruent   Thought Process  Thought Processes:Goal Directed  Descriptions of Associations:Intact  Orientation:Full (Time, Place and Person)  Thought Content:Logical  History of Schizophrenia/Schizoaffective disorder:No  Duration of Psychotic Symptoms:No data recorded Hallucinations:No data recorded Ideas of Reference:None  Suicidal Thoughts:No data recorded Homicidal Thoughts:No data recorded  Sensorium  Memory:Immediate Good; Recent Good; Remote Good  Judgment:Intact  Insight:Fair   Executive Functions  Concentration:Good  Attention  Span:Good  [place] of Knowledge:Good  Language:Good   Psychomotor Activity  Psychomotor Activity:No data recorded  Assets  Assets:Communication Skills; Desire for Improvement; Financial Resources/Insurance; Social Support; Talents/Skills   Sleep  Sleep:No data recorded   Physical Exam: Physical Exam Vitals and nursing note reviewed.  Constitutional:      Appearance: Normal appearance.  HENT:     Head: Normocephalic and atraumatic.     Mouth/Throat:     Pharynx: Oropharynx is clear.  Eyes:     Pupils: Pupils are equal, round, and reactive to light.  Cardiovascular:     Rate and Rhythm: Normal rate and regular  rhythm.  Pulmonary:     Effort: Pulmonary effort is normal.     Breath sounds: Normal breath sounds.  Abdominal:     General: Abdomen is flat.     Palpations: Abdomen is soft.  Musculoskeletal:        General: Normal range of motion.  Skin:    General: Skin is warm and dry.  Neurological:     General: No focal deficit present.     Mental Status: He is alert. Mental status is at baseline.  Psychiatric:        Mood and Affect: Mood normal.        Thought Content: Thought content normal.    Review of Systems  Constitutional: Negative.   HENT: Negative.   Eyes: Negative.   Respiratory: Negative.   Cardiovascular: Negative.   Gastrointestinal: Negative.   Musculoskeletal: Negative.   Skin: Negative.   Neurological: Negative.   Psychiatric/Behavioral: Negative.    Blood pressure (!) 129/97, pulse 89, temperature 98.2 F (36.8 C), temperature source Oral, resp. rate 18, height 5\' 6"  (1.676 m), weight 60.8 kg, SpO2 100 %. Body mass index is 21.63 kg/m.   Treatment Plan Summary: Plan Patient is to be discharged tomorrow.  [facility]  I have requested a 30-day supply of medicine and will provide prescriptions as well.  Follow-up with outpatient mental health and substance abuse treatment.  Reviewed plan with  patient.  Alethia Berthold, MD 02/16/2021, 7:47 PM

## 2021-02-16 NOTE — Progress Notes (Signed)
Patient pleasant and cooperative. Appropriate with staff and peers. Denies SI, HI, AVH. Medication compliant. Pt reports only has 1 more chemo treatment and he can be discharged. Pt excited about being discharged home. Pt noted in dayroom smiling and talking with peers.  Encouragement and support provided. Safety checks maintained. Medications given as prescribed. Pt receptive and remains safe on unit with q 15 min checks.

## 2021-02-16 NOTE — Plan of Care (Signed)
  Problem: Activity: Goal: Interest or engagement in leisure activities will improve Outcome: Progressing Goal: Imbalance in normal sleep/wake cycle will improve Outcome: Progressing   Problem: Coping: Goal: Coping ability will improve Outcome: Progressing Goal: Will verbalize feelings Outcome: Progressing   Problem: Health Behavior/Discharge Planning: Goal: Ability to make decisions will improve Outcome: Progressing Goal: Compliance with therapeutic regimen will improve Outcome: Progressing   Problem: Safety: Goal: Ability to disclose and discuss suicidal ideas will improve Outcome: Progressing

## 2021-02-16 NOTE — Progress Notes (Signed)
Pt has been cooperative and med compliant. Pt had his last radiation treatment today. Pt anticipates discharge tomorrow. Collier Bullock RN

## 2021-02-16 NOTE — Plan of Care (Signed)
  Problem: Education: Goal: Utilization of techniques to improve thought processes will improve Outcome: Progressing Goal: Knowledge of the prescribed therapeutic regimen will improve 02/16/2021 0528 by Jacinto Halim, RN Outcome: Progressing 02/15/2021 2344 by Jacinto Halim, RN Outcome: Progressing   Problem: Activity: Goal: Interest or engagement in leisure activities will improve 02/16/2021 0528 by Jacinto Halim, RN Outcome: Progressing 02/15/2021 2344 by Jacinto Halim, RN Outcome: Progressing Goal: Imbalance in normal sleep/wake cycle will improve Outcome: Progressing   Problem: Coping: Goal: Coping ability will improve 02/16/2021 0528 by Jacinto Halim, RN Outcome: Progressing 02/15/2021 2344 by Jacinto Halim, RN Outcome: Progressing Goal: Will verbalize feelings 02/16/2021 0528 by Jacinto Halim, RN Outcome: Progressing 02/15/2021 2344 by Jacinto Halim, RN Outcome: Progressing   Problem: Health Behavior/Discharge Planning: Goal: Ability to make decisions will improve Outcome: Progressing Goal: Compliance with therapeutic regimen will improve Outcome: Progressing

## 2021-02-16 NOTE — [facility] Suicide Risk Assessment (Signed)
[facility] Discharge Suicide Risk Assessment   Principal Problem: Severe recurrent major depression without psychotic features [place]) Discharge Diagnoses: Principal Problem:   Severe recurrent major depression without psychotic features ([place]) Active Problems:   Squamous cell carcinoma of oropharynx (HCC)   Cocaine abuse ([place])   Alcohol abuse   Total Time spent with patient: 45 minutes  Musculoskeletal: Strength & Muscle Tone: within normal limits Gait & Station: normal Patient leans: N/A  Psychiatric Specialty Exam: Review of Systems  Constitutional: Negative.   HENT: Negative.   Eyes: Negative.   Respiratory: Negative.   Cardiovascular: Negative.   Gastrointestinal: Negative.   Musculoskeletal: Negative.   Skin: Negative.   Neurological: Negative.   Psychiatric/Behavioral: Negative.     Blood pressure (!) 129/97, pulse 89, temperature 98.2 F (36.8 C), temperature source Oral, resp. rate 18, height 5\' 6"  (1.676 m), weight 60.8 kg, SpO2 100 %.Body mass index is 21.63 kg/m.  General Appearance: Casual  Eye Contact::  Good  Speech:  Clear and Coherent409  Volume:  Normal  Mood:  Euthymic  Affect:  Constricted  Thought Process:  Coherent  Orientation:  Full (Time, Place, and Person)  Thought Content:  Logical  Suicidal Thoughts:  No  Homicidal Thoughts:  No  Memory:  Immediate;   Fair Recent;   Fair Remote;   Fair  Judgement:  Fair  Insight:  Fair  Psychomotor Activity:  Normal  Concentration:  Fair  Recall:  [employer] of Knowledge:Fair  Language: Fair  Akathisia:  No  Handed:  Right  AIMS (if indicated):     Assets:  Desire for Improvement  Sleep:  Number of Hours: 8  Cognition: WNL  ADL's:  Intact   Mental Status Per Nursing Assessment::   On Admission:  Suicidal ideation indicated by patient  Demographic Factors:  Male  Loss Factors: Legal issues  Historical Factors: NA  Risk Reduction Factors:   Sense of responsibility to family, Religious  beliefs about death, Living with another person, especially a relative and Positive social support  Continued Clinical Symptoms:  Depression:   Impulsivity Alcohol/Substance Abuse/Dependencies  Cognitive Features That Contribute To Risk:  None    Suicide Risk:  Minimal: No identifiable suicidal ideation.  Patients presenting with no risk factors but with morbid ruminations; may be classified as minimal risk based on the severity of the depressive symptoms    Plan Of Care/Follow-up recommendations:  Activity:  Activity as tolerated Diet:  Regular diet Other:  Follow-up with outpatient treatment through [facility].  Alethia Berthold, MD 02/16/2021, 7:49 PM

## 2021-02-16 NOTE — Progress Notes (Signed)
Patient pleasant and cooperative. Voiced no concerns or complaints. Pt ready for discharge on tomorrow. Reports completed last treatment today. Noted on phone talking with people as well as socializing with peers appropriately. Denies any SI, HI, AVH.  Encouragement and support provided. Safety checks maintained. Medications given as prescribed. Pt receptive and remains safe on unit with q 15 min checks.

## 2021-02-17 ENCOUNTER — Other Ambulatory Visit: Payer: Self-pay

## 2021-02-17 DIAGNOSIS — F332 Major depressive disorder, recurrent severe without psychotic features: Secondary | ICD-10-CM

## 2021-02-17 NOTE — Progress Notes (Signed)
Pt denies SI, HI, and AVH. He was educated on prescriptions and  follow up care. Pt questions were answered and pt verbalized understanding and did not voice any concerns. Pt's belongings were returned and belongings sheet was signed. Pt was safely [facility].

## 2021-02-17 NOTE — Progress Notes (Signed)
  [facility] Adult Case Management Discharge Plan :  Will you be returning to the same living situation after discharge:  No. At discharge, do you have transportation home?: Yes,  CSW to assist with transportation Do you have the ability to pay for your medications: Yes,  Medicaid  Release of information consent forms completed and in the chart;  Patient's signature needed at discharge.  Patient to Follow up at:  Follow-up Information    [facility]. Call.   Why: Please call to confirm appointment.  Contact information: [place] [address] [phone]               Next level of care provider has access to [place] and Suicide Prevention discussed: Yes,  SPE compelted with patient and collateral contact.   Have you used any form of tobacco in the last 30 days? (Cigarettes, Smokeless Tobacco, Cigars, and/or Pipes): Yes  Has patient been referred to the Quitline?: Patient refused referral  Patient has been referred for addiction treatment: Yes  Durenda Hurt, LCSWA 02/17/2021, 8:11 AM

## 2021-02-17 NOTE — Progress Notes (Signed)
Recreation Therapy Notes  INPATIENT RECREATION TR PLAN  Patient Details Name: Paul Hebert MRN: [id number] DOB: 1971/02/25 Today's Date: 02/17/2021  Rec Therapy Plan Is patient appropriate for Therapeutic Recreation?: Yes Treatment times per week: at least 3 Estimated Length of Stay: 5-7 days TR Treatment/Interventions: Group participation (Comment)  Discharge Criteria Pt will be discharged from therapy if:: Discharged Treatment plan/goals/alternatives discussed and agreed upon by:: Patient/family  Discharge Summary Short term goals set: Patient will identify 3 social activities in an alcohol/drug, free-environment within 5 recreation therapy group sessions Short term goals met: Complete Progress toward goals comments: Groups attended Which groups?: Leisure education,Social skills,Self-esteem,Goal setting,Other (Comment) (Relaxation) Reason goals not met: N/A Therapeutic equipment acquired: N/A Reason patient discharged from therapy: Discharge from hospital Pt/family agrees with progress & goals achieved: Yes Date patient discharged from therapy: 02/17/21   Kellen Hover 02/17/2021, 2:56 PM

## 2021-02-17 NOTE — Discharge Summary (Signed)
Physician Discharge Summary Note  Patient:  Paul Hebert is an 50 y.o., male MRN:  [id number] DOB:  October 28, 1970 Patient phone:  [phone] (home)  Patient address:   [address] [place] [address],  Total Time spent with patient: 45 minutes  Date of Admission:  02/04/2021 Date of Discharge: Feb 17, 2021  Reason for Admission: Admitted because of ongoing suicidal ideation and depression  Principal Problem: Severe recurrent major depression without psychotic features [facility]) Discharge Diagnoses: Principal Problem:   Severe recurrent major depression without psychotic features ([place]) Active Problems:   Squamous cell carcinoma of oropharynx ([place])   Cocaine abuse ([place])   Alcohol abuse   Past Psychiatric History: History of substance abuse and depression  Past Medical History:  Past Medical History:  Diagnosis Date  . 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 PORT-A-CATH REMOVAL N/A 02/04/2021   Procedure: REMOVAL PORT-A-CATH;  Surgeon: Olean Ree, MD;  Location: [facility] ORS;  Service: General;  Laterality: N/A;  . PORTA CATH INSERTION N/A 10/25/2020   Procedure: PORTA CATH INSERTION;  Surgeon: Algernon Huxley, MD;  Location: [place] [facility];  Service: Cardiovascular;  Laterality: N/A;  . REMOVAL OF GASTROSTOMY TUBE N/A 02/04/2021   Procedure: REMOVAL OF GASTROSTOMY TUBE;  Surgeon: Olean Ree, MD;  Location: [facility] ORS;  Service: General;  Laterality: N/A;   Family History:  Family History  Problem Relation Age of Onset  . Cancer Father    Family Psychiatric  History: None Social History:  Social History   Substance and Sexual Activity  Alcohol Use Yes   Comment: 12 beers a day     Social History   Substance and Sexual Activity  Drug Use Yes  . Types: Marijuana, "Crack" cocaine    Social History   Socioeconomic History  . Marital status: Single    Spouse name: Not on file  . Number of children: Not on file  . Years of  education: Not on file  . Highest education level: Not on file  Occupational History  . Not on file  Tobacco Use  . Smoking status: Current Every Day Smoker    Packs/day: 0.50  . Smokeless tobacco: Never Used  Vaping Use  . Vaping Use: Never used  Substance and Sexual Activity  . Alcohol use: Yes    Comment: 12 beers a day  . Drug use: Yes    Types: Marijuana, "Crack" cocaine  . Sexual activity: Not Currently  Other Topics Concern  . Not on file  Social History Narrative  . Not on file   Social Determinants of Health   Financial Resource Strain: Not on file  Food Insecurity: Not on file  Transportation Needs: Not on file  Physical Activity: Not on file  Stress: Not on file  Social Connections: Not on file    Hospital Course: Patient was treated with antidepressant medicine and engaged in group therapy and individual therapy and assessment.  Showed gradual improvement in mood.  Did not display any dangerous or suicidal behavior.  Engage with social work and finding opportunities for substance abuse treatment.  No rehab facility wanted to work with him.  Patient had radiation treatment for his cancer while he was in the hospital.  Once this was done he was [facility] the [facility].  At the time of discharge reported that his mood was good denied any suicidal thought  Physical Findings: AIMS:  , ,  ,  ,  CIWA:    COWS:     Musculoskeletal: Strength & Muscle Tone: within normal limits Gait & Station: normal Patient leans: N/A                Psychiatric Specialty Exam:  Presentation  General Appearance: Casual  Eye Contact:Good  Speech:Normal Rate  Speech Volume:Normal  Handedness:Right   Mood and Affect  Mood:Anxious; Depressed  Affect:Congruent   Thought Process  Thought Processes:Goal Directed  Descriptions of Associations:Intact  Orientation:Full (Time, Place and Person)  Thought Content:Logical  History of  Schizophrenia/Schizoaffective disorder:No  Duration of Psychotic Symptoms:No data recorded Hallucinations:No data recorded Ideas of Reference:None  Suicidal Thoughts:No data recorded Homicidal Thoughts:No data recorded  Sensorium  Memory:Immediate Good; Recent Good; Remote Good  Judgment:Intact  Insight:Fair   Executive Functions  Concentration:Good  Attention Span:Good  [place] of Knowledge:Good  Language:Good   Psychomotor Activity  Psychomotor Activity:No data recorded  Assets  Assets:Communication Skills; Desire for Improvement; Financial Resources/Insurance; Social Support; Talents/Skills   Sleep  Sleep:No data recorded   Physical Exam: Physical Exam Vitals and nursing note reviewed.  Constitutional:      Appearance: Normal appearance.  HENT:     Head: Normocephalic and atraumatic.     Mouth/Throat:     Pharynx: Oropharynx is clear.  Eyes:     Pupils: Pupils are equal, round, and reactive to light.  Cardiovascular:     Rate and Rhythm: Normal rate and regular rhythm.  Pulmonary:     Effort: Pulmonary effort is normal.     Breath sounds: Normal breath sounds.  Abdominal:     General: Abdomen is flat.     Palpations: Abdomen is soft.  Musculoskeletal:        General: Normal range of motion.  Skin:    General: Skin is warm and dry.  Neurological:     General: No focal deficit present.     Mental Status: He is alert. Mental status is at baseline.  Psychiatric:        Mood and Affect: Mood normal.        Thought Content: Thought content normal.    Review of Systems  Constitutional: Negative.   HENT: Negative.   Eyes: Negative.   Respiratory: Negative.   Cardiovascular: Negative.   Gastrointestinal: Negative.   Musculoskeletal: Negative.   Skin: Negative.   Neurological: Negative.   Psychiatric/Behavioral: Negative.    Blood pressure 126/88, pulse (!) 106, temperature 98.2 F (36.8 C), temperature source Oral, resp. rate 18,  height 5\' 6"  (1.676 m), weight 60.8 kg, SpO2 100 %. Body mass index is 21.63 kg/m.   Have you used any form of tobacco in the last 30 days? (Cigarettes, Smokeless Tobacco, Cigars, and/or Pipes): Yes  Has this patient used any form of tobacco in the last 30 days? (Cigarettes, Smokeless Tobacco, Cigars, and/or Pipes) Yes, No  Blood Alcohol level:  Lab Results  Component Value Date   ETH <10 01/28/2021   ETH 222 (H) 40/98/1191    Metabolic Disorder Labs:  No results found for: HGBA1C, MPG No results found for: PROLACTIN No results found for: CHOL, TRIG, HDL, CHOLHDL, VLDL, LDLCALC  See Psychiatric Specialty Exam and Suicide Risk Assessment completed by Attending Physician prior to discharge.  Discharge destination:  Home  Is patient on multiple antipsychotic therapies at discharge:  No   Has Patient had three or more failed trials of antipsychotic monotherapy by history:  No  Recommended Plan for Multiple Antipsychotic Therapies: NA  Discharge Instructions  Diet - low sodium heart healthy   Complete by: As directed    Diet general   Complete by: As directed    Increase activity slowly   Complete by: As directed    No wound care   Complete by: As directed      Allergies as of 02/17/2021   No Known Allergies     Medication List    STOP taking these medications   amoxicillin-clavulanate 875-125 MG tablet Commonly known as: AUGMENTIN   dexamethasone 4 MG tablet Commonly known as: DECADRON   feeding supplement (OSMOLITE 1.5 CAL) Liqd   fentaNYL 12 MCG/HR Commonly known as: DURAGESIC   fentaNYL 25 MCG/HR Commonly known as: DURAGESIC   fluconazole 100 MG tablet Commonly known as: DIFLUCAN   gabapentin 300 MG capsule Commonly known as: NEURONTIN   lidocaine-prilocaine cream Commonly known as: EMLA   loperamide 2 MG capsule Commonly known as: IMODIUM   LORazepam 0.5 MG tablet Commonly known as: ATIVAN   nystatin 100000 UNIT/ML suspension Commonly known  as: MYCOSTATIN   ondansetron 8 MG tablet Commonly known as: Zofran   Oxycodone HCl 20 MG Tabs   Pfizer-BioNTech COVID-19 Vacc 30 MCG/0.3ML injection Generic drug: COVID-19 mRNA vaccine (Pfizer)   prochlorperazine 10 MG tablet Commonly known as: COMPAZINE   sertraline 50 MG tablet Commonly known as: ZOLOFT     TAKE these medications     Indication  ARIPiprazole 5 MG tablet Commonly known as: ABILIFY Take 1 tablet (5 mg total) by mouth daily.  Indication: Major Depressive Disorder   ARIPiprazole 5 MG tablet Commonly known as: ABILIFY Take 1 tablet (5 mg total) by mouth daily.  Indication: Major Depressive Disorder   escitalopram 10 MG tablet Commonly known as: Lexapro Take 1 tablet (10 mg total) by mouth daily. What changed: additional instructions  Indication: Major Depressive Disorder   escitalopram 10 MG tablet Commonly known as: Lexapro Take 1 tablet (10 mg total) by mouth daily. What changed: You were already taking a medication with the same name, and this prescription was added. Make sure you understand how and when to take each.  Indication: Major Depressive Disorder   pantoprazole 20 MG tablet Commonly known as: PROTONIX Take 1 tablet (20 mg total) by mouth daily. What changed: Another medication with the same name was added. Make sure you understand how and when to take each.  Indication: Gastroesophageal Reflux Disease   pantoprazole 20 MG tablet Commonly known as: PROTONIX Take 1 tablet (20 mg total) by mouth daily. What changed: You were already taking a medication with the same name, and this prescription was added. Make sure you understand how and when to take each.  Indication: Gastroesophageal Reflux Disease   traZODone 150 MG tablet Commonly known as: DESYREL Take 1 tablet (150 mg total) by mouth at bedtime.  Indication: Trouble Sleeping   traZODone 150 MG tablet Commonly known as: DESYREL Take 1 tablet (150 mg total) by mouth at bedtime.   Indication: [place]. Call.   Why: Please call to confirm appointment.  Contact information: [place] [address] [phone]               Follow-up recommendations:  Activity:  Activity as tolerated Diet:  Regular diet Other:  Follow-up with outpatient treatment with [facility]  Comments:   Prescriptions and a 30-day supply provided at discharge  Signed: Alethia Berthold, MD 02/17/2021, 7:05  PM

## 2021-02-17 NOTE — Plan of Care (Signed)
  Problem: Substance Use/Abuse °Goal: STG - Patient will identify 3 social activities in an alcohol/drug, free-environment within 5 recreation therapy group sessions °Description: STG - Patient will identify 3 social activities in an alcohol/drug, free-environment within 5 recreation therapy group sessions °Outcome: Completed/Met °  °

## 2021-03-15 ENCOUNTER — Other Ambulatory Visit: Payer: Self-pay

## 2021-03-15 ENCOUNTER — Emergency Department
Admission: EM | Admit: 2021-03-15 | Discharge: 2021-03-15 | Disposition: A | Discharge status: Home / Self Care | Payer: Medicaid Other | Attending: Emergency Medicine | Admitting: Emergency Medicine

## 2021-03-15 ENCOUNTER — Emergency Department: Payer: Medicaid Other

## 2021-03-15 DIAGNOSIS — Z85818 Personal history of malignant neoplasm of other sites of lip, oral cavity, and pharynx: Secondary | ICD-10-CM | POA: Insufficient documentation

## 2021-03-15 DIAGNOSIS — M542 Cervicalgia: Secondary | ICD-10-CM | POA: Insufficient documentation

## 2021-03-15 DIAGNOSIS — F17200 Nicotine dependence, unspecified, uncomplicated: Secondary | ICD-10-CM | POA: Diagnosis not present

## 2021-03-15 DIAGNOSIS — H9203 Otalgia, bilateral: Secondary | ICD-10-CM | POA: Insufficient documentation

## 2021-03-15 IMAGING — CR DG NECK SOFT TISSUE
2 series · 2 of 2 positions shown · non-contrast
Comparison: CT neck [DATE].  PET-CT [DATE].

CLINICAL DATA: Bilateral neck pain radiating into the ears. History
of tongue cancer.

EXAM:
NECK SOFT TISSUES - 1+ VIEW

[neck lat]
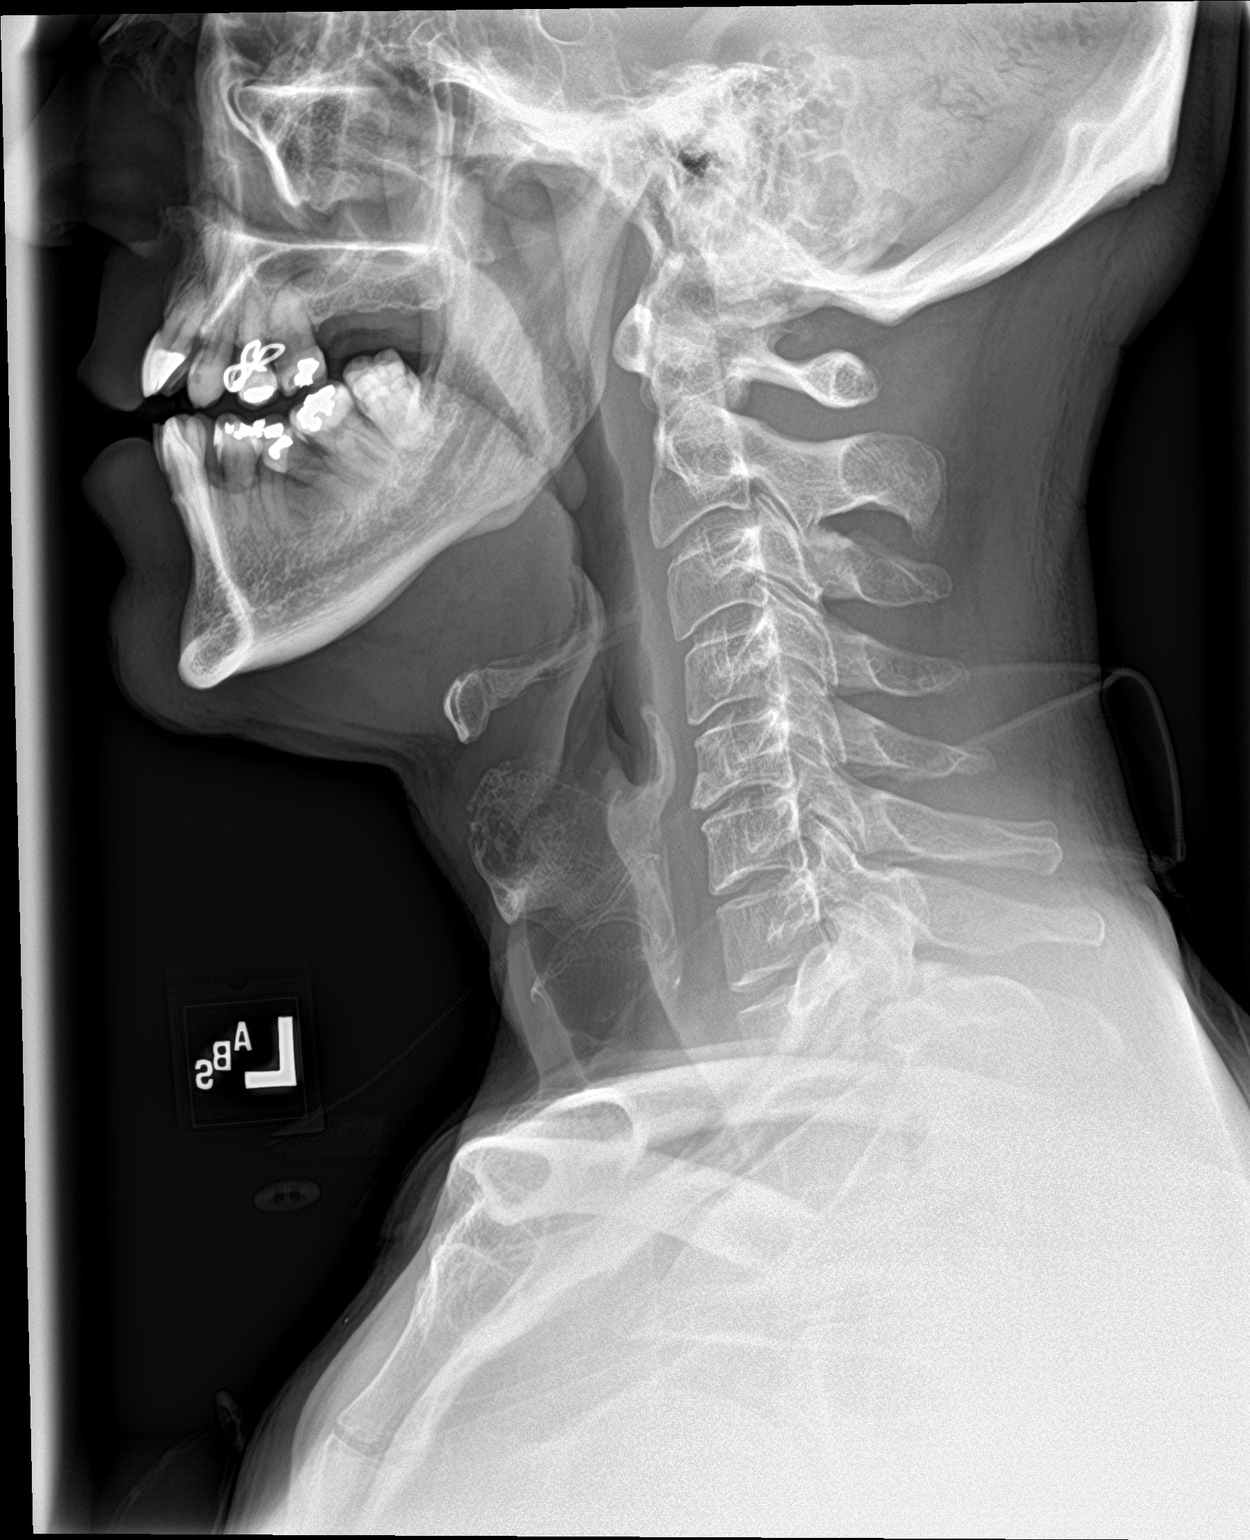

[neck ap]
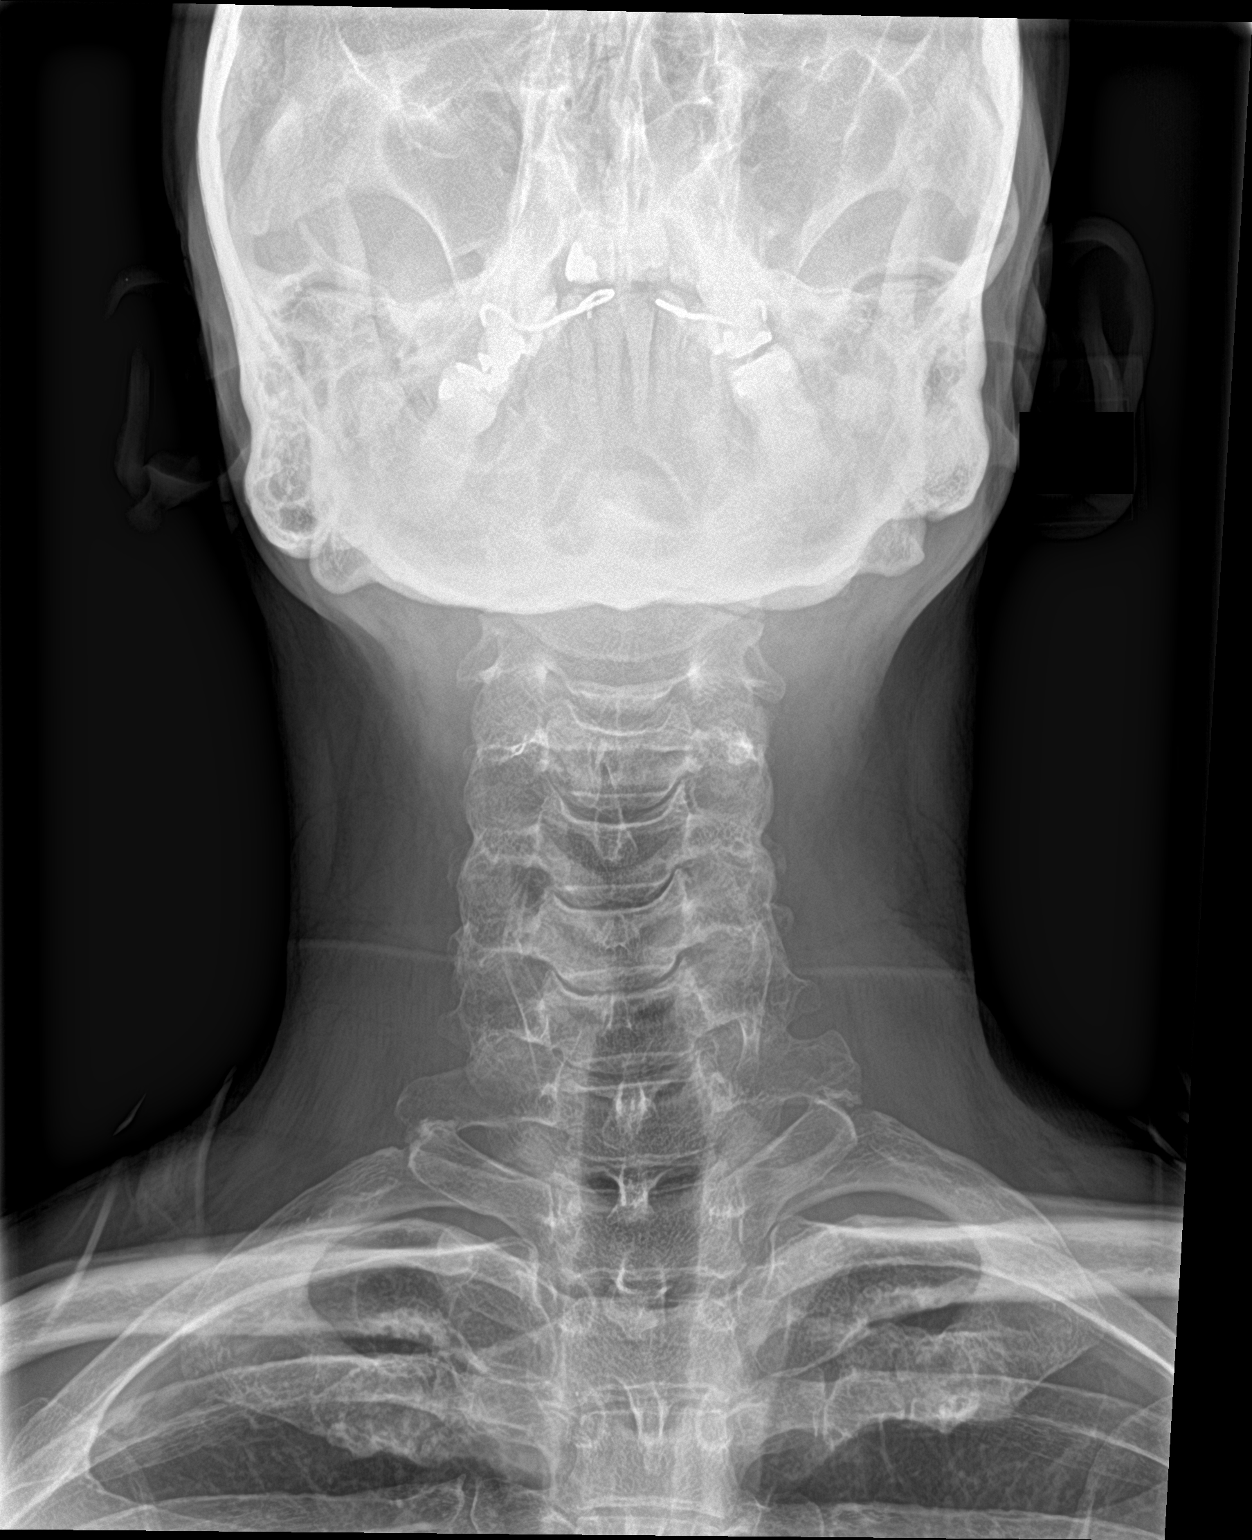

[2 of 2 positions shown; findings below may reference images not displayed]

FINDINGS: The prevertebral soft tissues are normal. No thickening of the
epiglottis or airway compromise identified. There is no evidence of
foreign body or soft tissue emphysema. Stable mild cervical
spondylosis and straightening. No acute osseous findings.
IMPRESSION: Unremarkable soft tissues of the neck. Stable mild cervical
spondylosis.

## 2021-03-15 MED ORDER — OXYCODONE HCL ER 10 MG PO T12A: 10.0000 mg | EXTENDED_RELEASE_TABLET | Freq: Two times a day (BID) | ORAL | 0 refills | Status: AC

## 2021-03-15 MED ORDER — OXYCODONE HCL 5 MG PO TABS
10.0000 mg | ORAL_TABLET | Freq: Once | ORAL | Status: AC
  Administered 2021-03-15: 10 mg via ORAL
  Filled 2021-03-15: qty 2

## 2021-03-15 NOTE — ED Triage Notes (Signed)
Pt comes with c/o bilateral ear pain for about week.  Pt is cancer pt and recently had treatment 4 weeks ago.

## 2021-03-15 NOTE — ED Provider Notes (Signed)
[facility] Emergency Department Provider Note   ____________________________________________   Event Date/Time   First MD Initiated Contact with Patient 03/15/21 1400     (approximate)  I have reviewed the triage vital signs and the nursing notes.   HISTORY  Chief Complaint Otalgia    HPI Paul Hebert is a 50 y.o. male patient complaining of bilateral neck pain radiating to ears.  Onset of complaint for 1 week ago.  Patient recently finished immunotherapy for cancer of the tongue and tonsillar  4 weeks ago.  Rates pain as a 8/10.  Described pain as "achy".  Patient pain increased with opening of the mandible.  Denies hearing loss or vertigo.      Past Medical History:  Diagnosis Date  . 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 Severe recurrent major depression without psychotic features ([place]) 02/05/2021  . Cocaine abuse ([place]) 01/28/2021  . Alcohol abuse 01/28/2021  . Severe major depression, single episode ([place]) 01/28/2021  . 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 Goals of care, counseling/discussion 10/18/2020    Past Surgical History:  Procedure Laterality Date  . PORT-A-CATH REMOVAL N/A 02/04/2021   Procedure: REMOVAL PORT-A-CATH;  Surgeon: Olean Ree, MD;  Location: [facility] ORS;  Service: General;  Laterality: N/A;  . PORTA CATH INSERTION N/A 10/25/2020   Procedure: PORTA CATH INSERTION;  Surgeon: Algernon Huxley, MD;  Location: [place] CV LAB;  Service: Cardiovascular;  Laterality: N/A;  . REMOVAL OF GASTROSTOMY TUBE N/A 02/04/2021   Procedure: REMOVAL OF GASTROSTOMY TUBE;  Surgeon: Olean Ree, MD;  Location: [facility] ORS;  Service: General;  Laterality: N/A;    Prior to Admission medications   Medication Sig Start Date End Date Taking? Authorizing Provider  oxyCODONE (OXYCONTIN) 10 mg 12 hr tablet Take 1 tablet (10 mg total) by mouth every 12 (twelve) hours for 5 days. 03/15/21  03/20/21 Yes Sable Feil, PA-C  ARIPiprazole (ABILIFY) 5 MG tablet Take 1 tablet (5 mg total) by mouth daily. 02/17/21   Clapacs, Madie Reno, MD  ARIPiprazole (ABILIFY) 5 MG tablet Take 1 tablet (5 mg total) by mouth daily. 02/17/21   Clapacs, Madie Reno, MD  escitalopram (LEXAPRO) 10 MG tablet Take 1 tablet (10 mg total) by mouth daily. 02/17/21   Clapacs, Madie Reno, MD  escitalopram (LEXAPRO) 10 MG tablet Take 1 tablet (10 mg total) by mouth daily. 02/17/21   Clapacs, Madie Reno, MD  pantoprazole (PROTONIX) 20 MG tablet Take 1 tablet (20 mg total) by mouth daily. 02/16/21   Clapacs, Madie Reno, MD  pantoprazole (PROTONIX) 20 MG tablet Take 1 tablet (20 mg total) by mouth daily. 02/16/21   Clapacs, Madie Reno, MD  traZODone (DESYREL) 150 MG tablet Take 1 tablet (150 mg total) by mouth at bedtime. 02/16/21   Clapacs, Madie Reno, MD  traZODone (DESYREL) 150 MG tablet Take 1 tablet (150 mg total) by mouth at bedtime. 02/16/21   Clapacs, Madie Reno, MD    Allergies Patient has no known allergies.  Family History  Problem Relation Age of Onset  . Cancer Father     Social History Social History   Tobacco Use  . Smoking status: Current Every Day Smoker    Packs/day: 0.50  . Smokeless tobacco: Never Used  Vaping Use  . Vaping Use: Never used  Substance Use Topics  . Alcohol use: Yes    Comment: 12 beers a day  .  Drug use: Yes    Types: Marijuana, "Crack" cocaine    Review of Systems Constitutional: No fever/chills Eyes: No visual changes. ENT: No sore throat. Cardiovascular: Denies chest pain. Respiratory: Denies shortness of breath. Gastrointestinal: No abdominal pain.  No nausea, no vomiting.  No diarrhea.  No constipation. Genitourinary: Negative for dysuria. Musculoskeletal: Negative for back pain. Skin: Negative for rash. Neurological: Negative for headaches, focal weakness or numbness. Psychiatric:  Alcohol abuse, cocaine abuse, and depression. Allergic/Immunilogical: Oral  cancer. ____________________________________________   PHYSICAL EXAM:  VITAL SIGNS: ED Triage Vitals  Enc Vitals Group     BP 03/15/21 1321 (!) 142/101     Pulse Rate 03/15/21 1321 (!) 102     Resp 03/15/21 1321 19     Temp 03/15/21 1321 98.2 F (36.8 C)     Temp src --      SpO2 03/15/21 1321 98 %     Weight 03/15/21 1403 134 lb 0.6 oz (60.8 kg)     Height 03/15/21 1403 5\' 6"  (1.676 m)     Head Circumference --      Peak Flow --      Pain Score 03/15/21 1322 8     Pain Loc --      Pain Edu? --      Excl. in [place]? --    Constitutional: Alert and oriented. Well appearing and in no acute distress. Eyes: Conjunctivae are normal. PERRL. EOMI. Head: Atraumatic. Nose: No congestion/rhinnorhea. Mouth/Throat: Mucous membranes are moist.  Oropharynx non-erythematous. Neck: No stridor.  Hematological/Lymphatic/Immunilogical: No cervical lymphadenopathy. Cardiovascular: Normal rate, regular rhythm. Grossly normal heart sounds.  Good peripheral circulation. Respiratory: Normal respiratory effort.  No retractions. Lungs CTAB. eurologic:  Normal speech and language. No gross focal neurologic deficits are appreciated. No gait instability. Skin:  Skin is warm, dry and intact. No rash noted. Psychiatric: Mood and affect are normal. Speech and behavior are normal.  ____________________________________________   LABS (all labs ordered are listed, but only abnormal results are displayed)  Labs Reviewed - No data to display ____________________________________________  EKG   ____________________________________________  RADIOLOGY I, Sable Feil, personally viewed and evaluated these images (plain radiographs) as part of my medical decision making, as well as reviewing the written report by the radiologist.  ED MD interpretation: No acute findings on soft tissue neck x-ray.  Official radiology report(s): DG Neck Soft Tissue  Result Date: 03/15/2021 CLINICAL DATA:  Bilateral neck  pain radiating into the ears. History of tongue cancer. EXAM: NECK SOFT TISSUES - 1+ VIEW COMPARISON:  CT neck 09/27/2020.  PET-CT 10/14/2020. FINDINGS: The prevertebral soft tissues are normal. No thickening of the epiglottis or airway compromise identified. There is no evidence of foreign body or soft tissue emphysema. Stable mild cervical spondylosis and straightening. No acute osseous findings. IMPRESSION: Unremarkable soft tissues of the neck. Stable mild cervical spondylosis. Electronically Signed   By: Richardean Sale M.D.   On: 03/15/2021 15:00    ____________________________________________   PROCEDURES  Procedure(s) performed (including Critical Care):  Procedures   ____________________________________________   INITIAL IMPRESSION / ASSESSMENT AND PLAN / ED COURSE  As part of my medical decision making, I reviewed the following data within the [place]        Patient presents with bilateral ear pain which is radiating from his neck.  Patient pain increased with opening of the mouth.  Patient completed his last session of chemotherapy 4 weeks ago for oral cancer.  Discussed no acute findings  on his soft tissue neck x-ray.  Patient advised to follow-up with scheduled oncology appointment.  Patient given prescription for 5 days oxycodone for pain control at review of his narcotic log.      ____________________________________________   FINAL CLINICAL IMPRESSION(S) / ED DIAGNOSES  Final diagnoses:  Otalgia of both ears     ED Discharge Orders         Ordered    oxyCODONE (OXYCONTIN) 10 mg 12 hr tablet  Every 12 hours        03/15/21 1513           Note:  This document was prepared using Dragon voice recognition software and may include unintentional dictation errors.    Sable Feil, PA-C 03/15/21 1518    Carrie Mew, MD 03/16/21 [phone]

## 2021-03-15 NOTE — ED Notes (Signed)
See triage note  Presents with bilateral ear pain   States pain started about 1 week ago   No fever   Provider in room on arrival

## 2021-03-15 NOTE — Discharge Instructions (Addendum)
Your soft tissue neck x-ray was unremarkable.  Follow-up with scheduled oncology appointment next week.

## 2021-03-16 ENCOUNTER — Encounter: Payer: Self-pay | Admitting: Oncology

## 2021-03-17 ENCOUNTER — Encounter: Payer: Self-pay | Admitting: Oncology

## 2021-03-24 ENCOUNTER — Encounter: Payer: Self-pay | Admitting: Radiation Oncology

## 2021-03-24 ENCOUNTER — Other Ambulatory Visit: Payer: Self-pay

## 2021-03-24 ENCOUNTER — Inpatient Hospital Stay: Payer: Medicaid Other | Attending: Oncology

## 2021-03-24 ENCOUNTER — Ambulatory Visit
Admission: RE | Admit: 2021-03-24 | Discharge: 2021-03-24 | Disposition: A | Discharge status: Home / Self Care | Payer: Medicaid Other | Source: Ambulatory Visit | Attending: Radiation Oncology | Admitting: Radiation Oncology

## 2021-03-24 VITALS — BP 144/91 | HR 90 | Temp 97.0°F | Resp 16 | Wt 151.9 lb

## 2021-03-24 DIAGNOSIS — C01 Malignant neoplasm of base of tongue: Secondary | ICD-10-CM | POA: Diagnosis present

## 2021-03-24 DIAGNOSIS — Z9221 Personal history of antineoplastic chemotherapy: Secondary | ICD-10-CM | POA: Diagnosis not present

## 2021-03-24 DIAGNOSIS — Z923 Personal history of irradiation: Secondary | ICD-10-CM | POA: Diagnosis not present

## 2021-03-24 DIAGNOSIS — C109 Malignant neoplasm of oropharynx, unspecified: Secondary | ICD-10-CM

## 2021-03-24 NOTE — Progress Notes (Signed)
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  Patient completed radiation (5/4), last chemotherapy in Feb 2022.  Noted hospital admission for psychiatric issues, drug use on 4/14.  PEG removed and port on 4/22.    Met with patient following MD follow-up.  Patient reports eating well except for some foods burning mouth and throat.  Has bilateral ear pain and swollen lymph node causing pain, ER visit noted.  Reports eating bacon, grits, eggs for breakfast yesterday. Ate ham, cheese, [place] sandwich yesterday and strawberry shortcake for lunch. Supper last night was 3 hot dogs and baked beans.  Drinking water, soda during the day. Says that he has not had alcohol or drugs since April. Has started working again.   PEG tube site has healed.  Medications: reviewed  Labs: no new  Anthropometrics:   Weight 151 lb 9 oz today in radiation  136 lb 1 oz on 4/5 per [facility] 143 lb 14.4 oz on 3/14 104 lb on 3/7 145 lb on 2/28 145 lb on 1/31   NUTRITION DIAGNOSIS: Predicted sub optimal energy intake improved    INTERVENTION:  Encouraged patient to continue to focus on good sources of protein at every meal and focus on well balanced diet to maintain weight.  Patient has contact information if anything changes with nutrition     NEXT VISIT: no follow-up planned RD available if something changes  Reeya Bound B. Zenia Resides, [place], [place] Registered Dietitian [phone] (mobile)

## 2021-03-24 NOTE — Progress Notes (Signed)
Radiation Oncology Follow up Note  Name: Paul Hebert   Date:   03/24/2021 MRN:  [id number] DOB: 1970-11-27    This 50 y.o. 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  REFERRING PROVIDER: No ref. provider found  HPI: Patient is a 50 year old male now out 1 month having completed concurrent chemoradiation therapy for stage IVa's p16 positive squamous of carcinoma left tongue base.  He is seen today in routine follow-up still complaining of some jaw pain and pain in his teeth of not certain etiology.  He has been to the emergency room for that.  He is never been formally set up with ENT.  He specifically denies dysphagia.Marland Kitchen  His treatment was interrupted by psychiatric visit.  He seems to be doing well at this time from that standpoint.  COMPLICATIONS OF TREATMENT: none  FOLLOW UP COMPLIANCE: keeps appointments   PHYSICAL EXAM:  BP (!) 144/91 (BP Location: Left Arm, Patient Position: Sitting)   Pulse 90   Temp (!) 97 F (36.1 C) (Tympanic)   Resp 16   Wt 151 lb 14.4 oz (68.9 kg)   BMI 24.52 kg/m  Oral cavity is clear no oral mucosal lesions are identified neck is clear without evidence of cervical or supraclavicular adenopathy.  Well-developed well-nourished patient in NAD. HEENT reveals PERLA, EOMI, discs not visualized.  Oral cavity is clear. No oral mucosal lesions are identified. Neck is clear without evidence of cervical or supraclavicular adenopathy. Lungs are clear to A&P. Cardiac examination is essentially unremarkable with regular rate and rhythm without murmur rub or thrill. Abdomen is benign with no organomegaly or masses noted. Motor sensory and DTR levels are equal and symmetric in the upper and lower extremities. Cranial nerves II through XII are grossly intact. Proprioception is intact. No peripheral adenopathy or edema is identified. No  motor or sensory levels are noted. Crude visual fields are within normal range.  RADIOLOGY RESULTS: No current films for review  PLAN: Present time patient is doing well very low side effect profile status post concurrent chemoradiation.  I am going to establish care with ENT for monthly surveillance of the patient.  I am also referring him to symptom management for some of the oral pain management.  He also set up to see Dr. Janese Banks in follow-up.  I have asked to see him back in 3 to 4 months for follow-up.  If his CT scan has not been ordered we will do that prior to his next visit.  I would like to take this opportunity to thank you for allowing me to participate in the care of your patient.Noreene Filbert, MD

## 2021-03-25 ENCOUNTER — Telehealth: Payer: Self-pay

## 2021-03-25 ENCOUNTER — Inpatient Hospital Stay: Payer: Medicaid Other | Admitting: Hospice and Palliative Medicine

## 2021-03-25 NOTE — Telephone Encounter (Signed)
Phoned patient to inform him that Dr. Baruch Gouty has sent over ENT referral. Pt was aware of this. ENT has contacted him and scheduled appointment for June 29th. Pt agreed to cancellation of today's appointment in [facility], since he will be following-up with ENT for current issue.

## 2021-03-27 ENCOUNTER — Emergency Department
Admission: EM | Admit: 2021-03-27 | Discharge: 2021-03-27 | Disposition: A | Discharge status: Home / Self Care | Payer: Medicaid Other | Attending: Emergency Medicine | Admitting: Emergency Medicine

## 2021-03-27 ENCOUNTER — Encounter: Payer: Self-pay | Admitting: Emergency Medicine

## 2021-03-27 ENCOUNTER — Other Ambulatory Visit: Payer: Self-pay

## 2021-03-27 ENCOUNTER — Emergency Department: Payer: Medicaid Other

## 2021-03-27 DIAGNOSIS — Z20822 Contact with and (suspected) exposure to covid-19: Secondary | ICD-10-CM | POA: Insufficient documentation

## 2021-03-27 DIAGNOSIS — F17210 Nicotine dependence, cigarettes, uncomplicated: Secondary | ICD-10-CM | POA: Insufficient documentation

## 2021-03-27 DIAGNOSIS — Z85818 Personal history of malignant neoplasm of other sites of lip, oral cavity, and pharynx: Secondary | ICD-10-CM | POA: Diagnosis not present

## 2021-03-27 DIAGNOSIS — B349 Viral infection, unspecified: Secondary | ICD-10-CM

## 2021-03-27 DIAGNOSIS — R509 Fever, unspecified: Secondary | ICD-10-CM | POA: Diagnosis present

## 2021-03-27 LAB — CBC WITH DIFFERENTIAL/PLATELET
Abs Immature Granulocytes: 0.02 10*3/uL (ref 0.00–0.07)
Basophils Absolute: 0 10*3/uL (ref 0.0–0.1)
Basophils Relative: 0 %
Eosinophils Absolute: 0.1 10*3/uL (ref 0.0–0.5)
Eosinophils Relative: 1 %
HCT: 38.9 % — ABNORMAL LOW (ref 39.0–52.0)
Hemoglobin: 14.1 g/dL (ref 13.0–17.0)
Immature Granulocytes: 0 %
Lymphocytes Relative: 14 %
Lymphs Abs: 1 10*3/uL (ref 0.7–4.0)
MCH: 35.3 pg — ABNORMAL HIGH (ref 26.0–34.0)
MCHC: 36.2 g/dL — ABNORMAL HIGH (ref 30.0–36.0)
MCV: 97.3 fL (ref 80.0–100.0)
Monocytes Absolute: 0.3 10*3/uL (ref 0.1–1.0)
Monocytes Relative: 4 %
Neutro Abs: 6.2 10*3/uL (ref 1.7–7.7)
Neutrophils Relative %: 81 %
Platelets: 176 10*3/uL (ref 150–400)
RBC: 4 MIL/uL — ABNORMAL LOW (ref 4.22–5.81)
RDW: 10.9 % — ABNORMAL LOW (ref 11.5–15.5)
WBC: 7.6 10*3/uL (ref 4.0–10.5)
nRBC: 0 % (ref 0.0–0.2)

## 2021-03-27 LAB — COMPREHENSIVE METABOLIC PANEL
ALT: 20 U/L (ref 0–44)
AST: 23 U/L (ref 15–41)
Albumin: 4.2 g/dL (ref 3.5–5.0)
Alkaline Phosphatase: 52 U/L (ref 38–126)
Anion gap: 7 (ref 5–15)
BUN: 13 mg/dL (ref 6–20)
CO2: 26 mmol/L (ref 22–32)
Calcium: 8.9 mg/dL (ref 8.9–10.3)
Chloride: 104 mmol/L (ref 98–111)
Creatinine, Ser: 0.9 mg/dL (ref 0.61–1.24)
GFR, Estimated: >60 mL/min (ref >60)
Glucose, Bld: 129 mg/dL — ABNORMAL HIGH (ref 70–99)
Potassium: 3.7 mmol/L (ref 3.5–5.1)
Sodium: 137 mmol/L (ref 135–145)
Total Bilirubin: 0.6 mg/dL (ref 0.3–1.2)
Total Protein: 6.7 g/dL (ref 6.5–8.1)

## 2021-03-27 LAB — URINALYSIS, COMPLETE (UACMP) WITH MICROSCOPIC
Bacteria, UA: NONE SEEN
Bilirubin Urine: NEGATIVE
Glucose, UA: NEGATIVE mg/dL
Hgb urine dipstick: NEGATIVE
Ketones, ur: NEGATIVE mg/dL
Leukocytes,Ua: NEGATIVE
Nitrite: NEGATIVE
Protein, ur: NEGATIVE mg/dL
Specific Gravity, Urine: 1.018 (ref 1.005–1.030)
Squamous Epithelial / HPF: NONE SEEN (ref 0–5)
pH: 6 (ref 5.0–8.0)

## 2021-03-27 LAB — RESP PANEL BY RT-PCR (FLU A&B, COVID) ARPGX2
Influenza A by PCR: NEGATIVE
Influenza B by PCR: NEGATIVE
SARS Coronavirus 2 by RT PCR: NEGATIVE

## 2021-03-27 LAB — LACTIC ACID, PLASMA: Lactic Acid, Venous: 1 mmol/L (ref 0.5–1.9)

## 2021-03-27 LAB — PROTIME-INR
INR: 1 (ref 0.8–1.2)
Prothrombin Time: 12.9 seconds (ref 11.4–15.2)

## 2021-03-27 MED ORDER — KETOROLAC TROMETHAMINE 30 MG/ML IJ SOLN
30.0000 mg | Freq: Once | INTRAMUSCULAR | Status: AC
  Administered 2021-03-27: 30 mg via INTRAVENOUS
  Filled 2021-03-27: qty 1

## 2021-03-27 MED ORDER — KETOROLAC TROMETHAMINE 10 MG PO TABS: 10.0000 mg | ORAL_TABLET | Freq: Three times a day (TID) | ORAL | 0 refills | Status: DC | PRN

## 2021-03-27 MED ORDER — ONDANSETRON HCL 4 MG PO TABS: 4.0000 mg | ORAL_TABLET | Freq: Three times a day (TID) | ORAL | 0 refills | Status: DC | PRN

## 2021-03-27 MED ORDER — SODIUM CHLORIDE 0.9 % IV BOLUS
1000.0000 mL | Freq: Once | INTRAVENOUS | Status: AC
  Administered 2021-03-27: 1000 mL via INTRAVENOUS

## 2021-03-27 MED ORDER — ACETAMINOPHEN 500 MG PO TABS
1000.0000 mg | ORAL_TABLET | Freq: Once | ORAL | Status: AC
  Administered 2021-03-27: 1000 mg via ORAL
  Filled 2021-03-27: qty 2

## 2021-03-27 NOTE — ED Triage Notes (Signed)
Pt reports that he developed a fever today, he has taken BC 90 minutes PTA. Tylenol and Ibuprofen yesterday. He states that he is having body aches and scratchy throat. Pt has cancer, he reports that he feels weak and not his self today.

## 2021-03-27 NOTE — Discharge Instructions (Addendum)
You did test negative for COVID and influenza today. Please seek medical attention for any high fevers, chest pain, shortness of breath, change in behavior, persistent vomiting, bloody stool or any other new or concerning symptoms.

## 2021-03-27 NOTE — ED Provider Notes (Signed)
[facility] Emergency Department Provider Note    ____________________________________________   I have reviewed the triage vital signs and the nursing notes.   HISTORY  Chief Complaint Fever   History limited by: Not Limited   HPI Paul Hebert is a 50 y.o. male who presents to the emergency department today because of concern for fever, body aches and sore throat. The patient states that he started having some sore throat and body aches yesterday. Fever started today. Tried multiple OTC medications today without any significant improvement in the fever. The patient states that he had been around some sick people at work although they had tested negative for [place]. The patient has history of oropharyngeal cancer and states that he has some baseline difficulty with swallowing but it has not been worse recently.    Records reviewed. Per medical record review patient has a history of squamous cell carcinoma of oropharynx.   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   Goals of care, counseling/discussion 10/18/2020    Past Surgical History:  Procedure Laterality Date   PORT-A-CATH REMOVAL N/A 02/04/2021   Procedure: REMOVAL PORT-A-CATH;  Surgeon: Olean Ree, MD;  Location: [facility] ORS;  Service: General;  Laterality: N/A;   PORTA CATH INSERTION N/A 10/25/2020   Procedure: PORTA CATH INSERTION;  Surgeon: Algernon Huxley, MD;  Location: [place] CV LAB;  Service: Cardiovascular;  Laterality: N/A;   REMOVAL OF GASTROSTOMY TUBE N/A 02/04/2021   Procedure: REMOVAL OF GASTROSTOMY TUBE;  Surgeon: Olean Ree, MD;  Location: [facility] ORS;   Service: General;  Laterality: N/A;    Prior to Admission medications   Not on File    Allergies Patient has no known allergies.  Family History  Problem Relation Age of Onset   Cancer Father     Social History Social History   Tobacco Use   Smoking status: Every Day    Packs/day: 0.50    Pack years: 0.00    Types: Cigarettes   Smokeless tobacco: Never  Vaping Use   Vaping Use: Never used  Substance Use Topics   Alcohol use: Yes    Comment: 12 beers a day   Drug use: Yes    Types: Marijuana, "Crack" cocaine    Review of Systems Constitutional: Positive for fevers and chills.  Eyes: No visual changes. ENT: Positive for sore throat.  Cardiovascular: Denies chest pain. Respiratory: Denies shortness of breath. Gastrointestinal: No abdominal pain.  No nausea, no vomiting.  No diarrhea.   Genitourinary: Negative for dysuria. Musculoskeletal: Positive for body aches.  Skin: Negative for rash. Neurological: Negative for headaches, focal weakness or numbness.  ____________________________________________   PHYSICAL EXAM:  VITAL SIGNS: ED Triage Vitals  Enc Vitals Group     BP 03/27/21 1531 (!) 164/85     Pulse Rate 03/27/21 1531 (!) 145     Resp 03/27/21 1531 16     Temp 03/27/21 1531 (!) 103.1 F (39.5 C)     Temp Source 03/27/21 1531 Oral     SpO2 --      Weight 03/27/21 1532 151 lb 14.4 oz (68.9 kg)     Height 03/27/21 1532  5\' 6"  (1.676 m)     Head Circumference --      Peak Flow --      Pain Score 03/27/21 1532 7    Constitutional: Alert and oriented.  Eyes: Conjunctivae are normal.  ENT      Head: Normocephalic and atraumatic.      Nose: No congestion/rhinnorhea.      Mouth/Throat: Mucous membranes are moist.      Neck: No stridor. Hematological/Lymphatic/Immunilogical: No cervical lymphadenopathy. Cardiovascular: Tachycardic, regular rhythm.  No murmurs, rubs, or gallops.  Respiratory: Normal respiratory effort without tachypnea nor  retractions. Breath sounds are clear and equal bilaterally. No wheezes/rales/rhonchi. Gastrointestinal: Soft and non tender. No rebound. No guarding.  Genitourinary: Deferred Musculoskeletal: Normal range of motion in all extremities. No lower extremity edema. Neurologic:  Normal speech and language. No gross focal neurologic deficits are appreciated.  Skin:  Skin is warm, dry and intact. No rash noted. Psychiatric: Mood and affect are normal. Speech and behavior are normal. Patient exhibits appropriate insight and judgment.  ____________________________________________    LABS (pertinent positives/negatives)  Lactic acid 1.0 INR 1.0 CMP wnl except glu 129 CBC wbc 7.6, hgb 14.1, plt 176 UA clear, 0-5 RBC and WBC COVID negative Flu negative ____________________________________________   EKG  None  ____________________________________________    RADIOLOGY  CXR No acute cardiopulmonary distress  ____________________________________________   PROCEDURES  Procedures  ____________________________________________   INITIAL IMPRESSION / ASSESSMENT AND PLAN / ED COURSE  Pertinent labs & imaging results that were available during my care of the patient were reviewed by me and considered in my medical decision making (see chart for details).   Patient presented to the emergency department today because of concerns for viral type symptoms. Initially patient was febrile and tachycardic. Given IV fluids and IV toradol. COVID and flu negative. At this time no pna on CXR. No leukocytosis. Do think likely viral illness. Patient did defervesce and heart rate improved after tylenol. Will plan on discharging with symptomatic treatment.  ____________________________________________   FINAL CLINICAL IMPRESSION(S) / ED DIAGNOSES  Final diagnoses:  Viral syndrome     Note: This dictation was prepared with Dragon dictation. Any transcriptional errors that result from this process  are unintentional     Nance Pear, MD 03/27/21 2328

## 2021-03-31 ENCOUNTER — Encounter: Payer: Self-pay | Admitting: Oncology

## 2021-04-01 ENCOUNTER — Encounter: Payer: Self-pay | Admitting: Oncology

## 2021-04-04 LAB — CULTURE, BLOOD (ROUTINE X 2)
Culture: NO GROWTH
Special Requests: ADEQUATE

## 2021-04-08 ENCOUNTER — Inpatient Hospital Stay (HOSPITAL_BASED_OUTPATIENT_CLINIC_OR_DEPARTMENT_OTHER): Payer: Medicaid Other | Admitting: Oncology

## 2021-04-08 ENCOUNTER — Encounter: Payer: Self-pay | Admitting: Oncology

## 2021-04-08 ENCOUNTER — Inpatient Hospital Stay: Payer: Medicaid Other

## 2021-04-08 VITALS — BP 114/57 | HR 97 | Temp 97.8°F | Resp 20 | Wt 145.5 lb

## 2021-04-08 DIAGNOSIS — Z923 Personal history of irradiation: Secondary | ICD-10-CM | POA: Diagnosis not present

## 2021-04-08 DIAGNOSIS — C109 Malignant neoplasm of oropharynx, unspecified: Secondary | ICD-10-CM

## 2021-04-08 LAB — CBC WITH DIFFERENTIAL/PLATELET
Abs Immature Granulocytes: 0.01 10*3/uL (ref 0.00–0.07)
Basophils Absolute: 0 10*3/uL (ref 0.0–0.1)
Basophils Relative: 0 %
Eosinophils Absolute: 0.1 10*3/uL (ref 0.0–0.5)
Eosinophils Relative: 2 %
HCT: 37.2 % — ABNORMAL LOW (ref 39.0–52.0)
Hemoglobin: 13.3 g/dL (ref 13.0–17.0)
Immature Granulocytes: 0 %
Lymphocytes Relative: 21 %
Lymphs Abs: 1.5 10*3/uL (ref 0.7–4.0)
MCH: 34 pg (ref 26.0–34.0)
MCHC: 35.8 g/dL (ref 30.0–36.0)
MCV: 95.1 fL (ref 80.0–100.0)
Monocytes Absolute: 0.5 10*3/uL (ref 0.1–1.0)
Monocytes Relative: 7 %
Neutro Abs: 5.1 10*3/uL (ref 1.7–7.7)
Neutrophils Relative %: 70 %
Platelets: 260 10*3/uL (ref 150–400)
RBC: 3.91 MIL/uL — ABNORMAL LOW (ref 4.22–5.81)
RDW: 11.1 % — ABNORMAL LOW (ref 11.5–15.5)
WBC: 7.3 10*3/uL (ref 4.0–10.5)
nRBC: 0 % (ref 0.0–0.2)

## 2021-04-08 LAB — BASIC METABOLIC PANEL
Anion gap: 7 (ref 5–15)
BUN: 16 mg/dL (ref 6–20)
CO2: 28 mmol/L (ref 22–32)
Calcium: 8.6 mg/dL — ABNORMAL LOW (ref 8.9–10.3)
Chloride: 104 mmol/L (ref 98–111)
Creatinine, Ser: 1 mg/dL (ref 0.61–1.24)
GFR, Estimated: >60 mL/min (ref >60)
Glucose, Bld: 120 mg/dL — ABNORMAL HIGH (ref 70–99)
Potassium: 4 mmol/L (ref 3.5–5.1)
Sodium: 139 mmol/L (ref 135–145)

## 2021-04-08 LAB — MAGNESIUM: Magnesium: 2.2 mg/dL (ref 1.7–2.4)

## 2021-04-08 NOTE — Progress Notes (Signed)
Patient states he is having swelling in the lymph node that was cancerous. Patient states eat and drink irritates this lymph node. Patient states that since his last treatment 6-7 weeks ago he can not drink or eat anything without getting a very bad migraine. Patient states he is also having dizzy spells. Patient states he is taking about 10 BC powder. Patient states he has been experiencing dizzy spells for the past week. Patient states he is having acid reflux really bad.    Patient states he is 69 days clean of drugs and alcohol.

## 2021-04-10 ENCOUNTER — Encounter: Payer: Self-pay | Admitting: Oncology

## 2021-04-10 NOTE — Progress Notes (Signed)
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 Patient states that he presented with these complaints 6 months ago and had a CT soft tissue neck which did not show any malignancy.  He continued to have on and off pain which was gradually getting worse and he presented back to the ER on 09/27/2020.     CT soft tissue neck showed a left base of tongue mass measuring 3.4 x 2.6 x 3.3 cm which was invading the tongue along the anterior and medial margin and extending into the left vallecula.  Possible superior and posterior extension to involve the left palatine tonsil the lesion abuts the uvula with early extension into the soft palate.  Multiple left level 2 lymph nodes largest of which was 1.2 cm.  Additional necrotic limited to level 3 nodal conglomerate measures 1.3 cm.  No right-sided adenopathy.   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  PET CT scan Showed left base of tongue mass measuring about 2.8 cm with enlarged centrally necrotic level 2/3 left-sided and lymph nodes with an SUV about 3.7.  No hypermetabolic right neck nodes.  No findings of distant metastatic disease   Patient completed 5 cycles of weekly cisplatin chemotherapy between January to March 2022.  There were treatment interruptions due to mucositis.  He could not  complete 7 cycles eventually.  He did complete his radiation treatment.  Towards the end of radiation patient was admitted for suicidal ideation.  Interval history-patient is with his significant other today and reports that they did get back after he abstain from alcohol.  He does report discomfort and pain in the left aspect of his face as well as neck which radiates to his years and often causes headache.  ECOG PS- 1 Pain scale- 4   Review of systems- Review of Systems  HENT:         Left sided neck pain      No Known Allergies   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([place])      Past Surgical History:  Procedure Laterality Date   PORT-A-CATH REMOVAL N/A 02/04/2021   Procedure: REMOVAL PORT-A-CATH;  Surgeon: Olean Ree, MD;  Location: [facility] ORS;  Service: General;  Laterality: N/A;   PORTA CATH INSERTION N/A 10/25/2020   Procedure: PORTA CATH INSERTION;  Surgeon: Algernon Huxley, MD;  Location: [place] [facility] LAB;  Service: Cardiovascular;  Laterality: N/A;   REMOVAL OF GASTROSTOMY TUBE N/A 02/04/2021   Procedure: REMOVAL OF GASTROSTOMY TUBE;  Surgeon: Olean Ree, MD;  Location: [facility] ORS;  Service: General;  Laterality: N/A;    Social History   Socioeconomic History   Marital status: Single    Spouse name: Not on file   Number of children: Not on file   Years of education: Not on file   Highest education level: Not on  file  Occupational History   Not on file  Tobacco Use   Smoking status: Every Day    Packs/day: 0.50    Pack years: 0.00    Types: Cigarettes   Smokeless tobacco: Never  Vaping Use   Vaping Use: Never used  Substance and Sexual Activity   Alcohol use: Yes    Comment: 12 beers a day   Drug use: Yes    Types: Marijuana, "Crack" cocaine   Sexual activity: Not Currently  Other Topics Concern   Not on file  Social History Narrative   Not on file   Social Determinants of Health   Financial Resource Strain: Not on file  Food  Insecurity: Not on file  Transportation Needs: Not on file  Physical Activity: Not on file  Stress: Not on file  Social Connections: Not on file  Intimate Partner Violence: Not on file    Family History  Problem Relation Age of Onset   Cancer Father      Current Outpatient Medications:    ketorolac (TORADOL) 10 MG tablet, Take 1 tablet (10 mg total) by mouth every 8 (eight) hours as needed for severe pain. (Patient not taking: Reported on 04/08/2021), Disp: 20 tablet, Rfl: 0   ondansetron (ZOFRAN) 4 MG tablet, Take 1 tablet (4 mg total) by mouth every 8 (eight) hours as needed for nausea or vomiting. (Patient not taking: Reported on 04/08/2021), Disp: 20 tablet, Rfl: 0  Physical exam:  Vitals:   04/08/21 1455  BP: (!) 114/57  Pulse: 97  Resp: 20  Temp: 97.8 F (36.6 C)  SpO2: 100%  Weight: 145 lb 8 oz (66 kg)   Physical Exam HENT:     Head: Normocephalic and atraumatic.     Mouth/Throat:     Mouth: Mucous membranes are moist.     Pharynx: Oropharynx is clear.     Comments: No visible tonsillar mass Cardiovascular:     Rate and Rhythm: Normal rate and regular rhythm.     Heart sounds: Normal heart sounds.  Pulmonary:     Effort: Pulmonary effort is normal.     Breath sounds: Normal breath sounds.  Abdominal:     General: Bowel sounds are normal.     Palpations: Abdomen is soft.  Musculoskeletal:     Cervical back: Normal range of motion.  Lymphadenopathy:     Comments: There is some palpable induration/ possible mass in the left neck  Skin:    General: Skin is warm and dry.  Neurological:     Mental Status: He is alert and oriented to person, place, and time.     CMP Latest Ref Rng & Units 04/08/2021  Glucose 70 - 99 mg/dL 120(H)  BUN 6 - 20 mg/dL 16  Creatinine 0.61 - 1.24 mg/dL 1.00  Sodium 135 - 145 mmol/L 139  Potassium 3.5 - 5.1 mmol/L 4.0  Chloride 98 - 111 mmol/L 104  CO2 22 - 32 mmol/L 28  Calcium 8.9 - 10.3 mg/dL 8.6(L)  Total Protein 6.5 - 8.1  g/dL -  Total Bilirubin 0.3 - 1.2 mg/dL -  Alkaline Phos 38 - 126 U/L -  AST 15 - 41 U/L -  ALT 0 - 44 U/L -   CBC Latest Ref Rng & Units 04/08/2021  WBC 4.0 - 10.5 K/uL 7.3  Hemoglobin 13.0 - 17.0 g/dL 13.3  Hematocrit 39.0 - 52.0 % 37.2(L)  Platelets 150 - 400 K/uL 260    No images are attached to the  encounter.  DG Neck Soft Tissue  Result Date: 03/15/2021 CLINICAL DATA:  Bilateral neck pain radiating into the ears. History of tongue cancer. EXAM: NECK SOFT TISSUES - 1+ VIEW COMPARISON:  CT neck 09/27/2020.  PET-CT 10/14/2020. FINDINGS: The prevertebral soft tissues are normal. No thickening of the epiglottis or airway compromise identified. There is no evidence of foreign body or soft tissue emphysema. Stable mild cervical spondylosis and straightening. No acute osseous findings. IMPRESSION: Unremarkable soft tissues of the neck. Stable mild cervical spondylosis. Electronically Signed   By: Richardean Sale M.D.   On: 03/15/2021 15:00   DG Chest 2 View  Result Date: 03/27/2021 CLINICAL DATA:  Fever, body aches and weakness. EXAM: CHEST - 2 VIEW COMPARISON:  05/06/2019 FINDINGS: The cardiac silhouette, mediastinal and hilar contours are within normal limits. The lungs are clear. No pleural effusions. No pulmonary lesions. The bony thorax is intact. IMPRESSION: No acute cardiopulmonary findings. Electronically Signed   By: Marijo Sanes M.D.   On: 03/27/2021 16:10     Assessment and plan- Patient is a 50 y.o. 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  He is s/p 5 cycles of weekly cisplatin chemotherapy with concurrent radiation treatment.  Chemo was stopped prematurely due to worsening mucositis and patient completed radiation treatment in April 2022.  He has now come to reestablish follow-up  There are multiple treatment interruptions with both chemotherapy and radiation treatment.  Last radiation treatment was given on 02/16/2021.   I will plan to get a PET CT scan in mid August 2022 roughly 12 weeks post RT. He is following up with ENT Dr. Con Memos next week  Left sided neck pain- will prescribe gabapentin 300 mg TID. I will see him in 1 month to assess his pain    Visit Diagnosis 1. Squamous cell carcinoma of oropharynx ([place])      Dr. Randa Evens, MD, MPH [facility] [phone] 04/10/2021 5:55 PM

## 2021-04-11 ENCOUNTER — Telehealth: Payer: Self-pay

## 2021-04-11 ENCOUNTER — Telehealth: Payer: Self-pay | Admitting: Oncology

## 2021-04-11 ENCOUNTER — Other Ambulatory Visit: Payer: Self-pay

## 2021-04-11 MED ORDER — GABAPENTIN 300 MG PO CAPS: 300.0000 mg | ORAL_CAPSULE | Freq: Three times a day (TID) | ORAL | 0 refills | Status: DC

## 2021-04-11 NOTE — Telephone Encounter (Signed)
Spoke with patient regarding change in appts for PET scan (moved to August) along with MD f/u. Patient agreeable to all changes. Requested a copy of appts in the mail.

## 2021-04-12 ENCOUNTER — Ambulatory Visit: Payer: Medicaid Other

## 2021-04-15 ENCOUNTER — Ambulatory Visit: Payer: Medicaid Other | Admitting: Oncology

## 2021-05-03 ENCOUNTER — Encounter: Payer: Self-pay | Admitting: Oncology

## 2021-05-04 ENCOUNTER — Other Ambulatory Visit: Payer: Self-pay | Admitting: *Deleted

## 2021-05-04 ENCOUNTER — Telehealth: Payer: Self-pay | Admitting: *Deleted

## 2021-05-04 NOTE — Telephone Encounter (Signed)
I called the pt. Back and he said that he needed the date of him being positive for Covid and that he was told that he needs to stay our of work for 2 weeks and no chemo or radiation at that time. Something like that pt said. I did the letter and pt can come pick it up in [place] 7/21. Pt will come and get it in afternoon

## 2021-05-10 ENCOUNTER — Ambulatory Visit: Payer: Medicaid Other | Admitting: Oncology

## 2021-05-17 ENCOUNTER — Other Ambulatory Visit: Payer: Self-pay

## 2021-05-17 ENCOUNTER — Ambulatory Visit
Admission: RE | Admit: 2021-05-17 | Discharge: 2021-05-17 | Disposition: A | Discharge status: Home / Self Care | Payer: Medicaid Other | Source: Ambulatory Visit | Attending: Oncology | Admitting: Oncology

## 2021-05-17 DIAGNOSIS — Z79899 Other long term (current) drug therapy: Secondary | ICD-10-CM | POA: Diagnosis not present

## 2021-05-17 DIAGNOSIS — J8410 Pulmonary fibrosis, unspecified: Secondary | ICD-10-CM | POA: Insufficient documentation

## 2021-05-17 DIAGNOSIS — I700 Atherosclerosis of aorta: Secondary | ICD-10-CM | POA: Insufficient documentation

## 2021-05-17 DIAGNOSIS — C01 Malignant neoplasm of base of tongue: Secondary | ICD-10-CM | POA: Diagnosis not present

## 2021-05-17 DIAGNOSIS — R221 Localized swelling, mass and lump, neck: Secondary | ICD-10-CM | POA: Diagnosis not present

## 2021-05-17 DIAGNOSIS — C109 Malignant neoplasm of oropharynx, unspecified: Secondary | ICD-10-CM | POA: Diagnosis present

## 2021-05-17 DIAGNOSIS — J439 Emphysema, unspecified: Secondary | ICD-10-CM | POA: Diagnosis not present

## 2021-05-17 LAB — GLUCOSE, CAPILLARY: Glucose-Capillary: 108 mg/dL — ABNORMAL HIGH (ref 70–99)

## 2021-05-17 MED ORDER — FLUDEOXYGLUCOSE F - 18 (FDG) INJECTION
7.0000 | Freq: Once | INTRAVENOUS | Status: AC | PRN
  Administered 2021-05-17: 7.38 via INTRAVENOUS

## 2021-05-20 ENCOUNTER — Encounter: Payer: Self-pay | Admitting: Oncology

## 2021-05-20 ENCOUNTER — Ambulatory Visit: Payer: Medicaid Other | Admitting: Oncology

## 2021-05-20 ENCOUNTER — Inpatient Hospital Stay: Payer: Medicaid Other | Attending: Oncology | Admitting: Oncology

## 2021-05-20 VITALS — BP 102/60 | HR 102 | Temp 98.2°F | Resp 16 | Ht 66.0 in | Wt 137.7 lb

## 2021-05-20 DIAGNOSIS — F17210 Nicotine dependence, cigarettes, uncomplicated: Secondary | ICD-10-CM | POA: Insufficient documentation

## 2021-05-20 DIAGNOSIS — C109 Malignant neoplasm of oropharynx, unspecified: Secondary | ICD-10-CM | POA: Diagnosis present

## 2021-05-20 DIAGNOSIS — Z9221 Personal history of antineoplastic chemotherapy: Secondary | ICD-10-CM | POA: Diagnosis not present

## 2021-05-20 DIAGNOSIS — A630 Anogenital (venereal) warts: Secondary | ICD-10-CM | POA: Diagnosis not present

## 2021-05-20 DIAGNOSIS — Z923 Personal history of irradiation: Secondary | ICD-10-CM | POA: Insufficient documentation

## 2021-05-20 NOTE — Progress Notes (Signed)
Pt states that he in hot area with work in [employer] and he forgets to drink water, he states that when he drinks,, gatarade and power aid he gets heart burn. He drinks ensures but sometimes after drinking if feels thick- I advised him he can put milk in it an it makes if thinner. He can wake up and has sputum so thick in his throat it is hard to get out.

## 2021-05-20 NOTE — Progress Notes (Signed)
Survivorship Care Plan visit completed.  Treatment summary reviewed and given to patient.  ASCO answers booklet reviewed and given to patient.  CARE program and Cancer Transitions discussed with patient along with other resources cancer center offers to patients and caregivers.  Patient verbalized understanding.    

## 2021-05-23 ENCOUNTER — Ambulatory Visit: Payer: Medicaid Other | Admitting: Oncology

## 2021-05-25 ENCOUNTER — Encounter: Payer: Self-pay | Admitting: Oncology

## 2021-05-28 ENCOUNTER — Encounter: Payer: Self-pay | Admitting: Oncology

## 2021-05-28 NOTE — Progress Notes (Signed)
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 Patient states that he presented with these complaints 6 months ago and had a CT soft tissue neck which did not show any malignancy.  He continued to have on and off pain which was gradually getting worse and he presented back to the ER on 09/27/2020.     CT soft tissue neck showed a left base of tongue mass measuring 3.4 x 2.6 x 3.3 cm which was invading the tongue along the anterior and medial margin and extending into the left vallecula.  Possible superior and posterior extension to involve the left palatine tonsil the lesion abuts the uvula with early extension into the soft palate.  Multiple left level 2 lymph nodes largest of which was 1.2 cm.  Additional necrotic limited to level 3 nodal conglomerate measures 1.3 cm.  No right-sided adenopathy.   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  PET CT scan Showed left base of tongue mass measuring about 2.8 cm with enlarged centrally necrotic level 2/3 left-sided and lymph nodes with an SUV about 3.7.  No hypermetabolic right neck nodes.  No findings of distant metastatic disease   Patient completed 5 cycles of weekly cisplatin chemotherapy between January to March 2022.  There were treatment interruptions due to mucositis.   He could not complete 7 cycles eventually.  He did complete his radiation treatment.   Towards the end of radiation patient was admitted for suicidal ideation.  Interval history-patient has found a job and is back with his girlfriend and overall doing well.  However he continues to smoke as well as occasional alcohol intake.  He has some residual discomfort at the back of his left throat.  He is not on any medications for the same.  Denies other complaints at this time  ECOG PS- 1 Pain scale- 3   Review of systems- Review of Systems  Constitutional:  Negative for chills, fever, malaise/fatigue and weight loss.  HENT:  Positive for sore throat. Negative for congestion, ear discharge and nosebleeds.   Eyes:  Negative for blurred vision.  Respiratory:  Negative for cough, hemoptysis, sputum production, shortness of breath and wheezing.   Cardiovascular:  Negative for chest pain, palpitations, orthopnea and claudication.  Gastrointestinal:  Negative for abdominal pain, blood in stool, constipation, diarrhea, heartburn, melena, nausea and vomiting.  Genitourinary:  Negative for dysuria, flank pain, frequency, hematuria and urgency.  Musculoskeletal:  Negative for back pain, joint pain and myalgias.  Skin:  Negative for rash.  Neurological:  Negative for dizziness, tingling, focal weakness, seizures, weakness and headaches.  Endo/Heme/Allergies:  Does not bruise/bleed easily.  Psychiatric/Behavioral:  Negative for depression and suicidal ideas. The patient does not have insomnia.     No Known Allergies   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[facility])      Past Surgical  History:  Procedure Laterality Date   PORT-A-CATH REMOVAL N/A 02/04/2021   Procedure: REMOVAL PORT-A-CATH;  Surgeon: Olean Ree, MD;  Location: [facility] ORS;  Service: General;  Laterality: N/A;   PORTA CATH INSERTION N/A 10/25/2020   Procedure: PORTA CATH INSERTION;  Surgeon: Algernon Huxley, MD;  Location: [place] CV LAB;  Service: Cardiovascular;  Laterality: N/A;   REMOVAL OF GASTROSTOMY TUBE N/A 02/04/2021   Procedure: REMOVAL OF GASTROSTOMY TUBE;  Surgeon: Olean Ree, MD;  Location: [facility] ORS;  Service: General;  Laterality: N/A;    Social History   Socioeconomic History   Marital status: Single    Spouse name: Not on file   Number of children: Not on file   Years of education: Not on file   Highest education level: Not on file  Occupational History   Not on file  Tobacco Use   Smoking status: Every Day    Packs/day: 1.00    Types: Cigarettes   Smokeless tobacco: Current   Tobacco comments:    Patches very rarely  Vaping Use   Vaping Use: Never used  Substance and Sexual Activity   Alcohol use: Yes    Comment: 12  week   Drug use: Yes    Types: Marijuana, "Crack" cocaine    Comment: 2 months last time usine   Sexual activity: Not Currently  Other Topics Concern   Not on file  Social History Narrative   Not on file   Social Determinants of Health   Financial Resource Strain: Not on file  Food Insecurity: Not on file  Transportation Needs: Not on file  Physical Activity: Not on file  Stress: Not on file  Social Connections: Not on file  Intimate Partner Violence: Not on file    Family History  Problem Relation Age of Onset   Cancer Father     No current outpatient medications on file.  Physical exam:  Vitals:   05/20/21 1427  BP: 102/60  Pulse: (!) 102  Resp: 16  Temp: 98.2 F (36.8 C)  TempSrc: Oral  Weight: 137 lb 11.2 oz (62.5 kg)  Height: '5\' 6"'$  (1.676 m)   Physical Exam HENT:     Mouth/Throat:     Mouth: Mucous membranes are moist.     Pharynx: Oropharynx is clear.  Cardiovascular:     Rate and Rhythm: Normal rate and regular rhythm.     Heart sounds: Normal heart sounds.  Pulmonary:     Effort: Pulmonary effort is normal.     Breath sounds: Normal breath sounds.  Abdominal:     General: Bowel sounds are normal.     Palpations:  Abdomen is soft.  Lymphadenopathy:     Comments: No palpable cervical adenopathy  Skin:    General: Skin is warm and dry.  Neurological:     Mental Status: He is alert and oriented to person, place, and time.     CMP Latest Ref Rng & Units 04/08/2021  Glucose 70 - 99 mg/dL 120(H)  BUN 6 - 20 mg/dL 16  Creatinine 0.61 - 1.24 mg/dL 1.00  Sodium 135 - 145 mmol/L 139  Potassium 3.5 - 5.1 mmol/L 4.0  Chloride 98 - 111 mmol/L 104  CO2 22 - 32 mmol/L 28  Calcium 8.9 - 10.3 mg/dL 8.6(L)  Total Protein 6.5 - 8.1 g/dL -  Total Bilirubin 0.3 - 1.2 mg/dL -  Alkaline Phos 38 - 126 U/L -  AST 15 - 41 U/L -  ALT 0 -  44 U/L -   CBC Latest Ref Rng & Units 04/08/2021  WBC 4.0 - 10.5 K/uL 7.3  Hemoglobin 13.0 - 17.0 g/dL 13.3  Hematocrit 39.0 - 52.0 % 37.2(L)  Platelets 150 - 400 K/uL 260    No images are attached to the encounter.  NM PET Image Restag (PS) Skull Base To Thigh  Result Date: 05/17/2021 CLINICAL DATA:  Subsequent treatment strategy for neck mass in a 50 year old male post chemo and radiotherapy for squamous cell carcinoma at the LEFT base of tongue. EXAM: NUCLEAR MEDICINE PET SKULL BASE TO THIGH TECHNIQUE: 7.38 mCi F-18 FDG was injected intravenously. Full-ring PET imaging was performed from the skull base to thigh after the radiotracer. CT data was obtained and used for attenuation correction and anatomic localization. Fasting blood glucose: 108 mg/dl COMPARISON:  In the undo that FINDINGS: Mediastinal blood pool activity: SUV max 1.77 Liver activity: SUV max not applicable NECK: Mild asymmetry of LEFT base of tongue without Hebert mass. Mild asymmetric FDG uptake with a maximum SUV of 3.6 as compared to 10.8 on the prior study. (Image 36/6) No signs of adenopathy in the neck. No FDG avid lymph nodes. In the area of previous nodal disease there is no significant asymmetry with respect FDG uptake. Incidental CT findings: none CHEST: Choose 1 Incidental CT findings: Calcified atheromatous  plaque of the thoracic aorta. Normal heart size. Normal caliber of the central pulmonary vasculature. Normal appearance of the esophagus. Mild pulmonary emphysema with mild bullous changes at the RIGHT lung apex. Airways are patent. No effusion. No consolidation. ABDOMEN/PELVIS: No abnormal hypermetabolic activity within the liver, pancreas, adrenal glands, or spleen. No hypermetabolic lymph nodes in the abdomen or pelvis. Incidental CT findings: Hypodense area in the dome of the RIGHT hemi liver without increased metabolic activity unchanged. This is likely a cyst or hemangioma no pericholecystic stranding. No acute findings relative to the pancreas, spleen, adrenal glands, kidneys, urinary bladder, stomach, small or large bowel. Reproductive structures are unremarkable. There is no free fluid in the pelvis. SKELETON: No focal hypermetabolic activity to suggest skeletal metastasis. Incidental CT findings: none IMPRESSION: Marked interval response to therapy, mild residual asymmetric activity more likely post treatment related in the LEFT neck, no signs of metastatic disease. Pulmonary granuloma at the RIGHT lung base, no suspicious pulmonary nodules. Aortic Atherosclerosis (ICD10-I70.0) and Emphysema (ICD10-J43.9). Electronically Signed   By: Zetta Bills M.D.   On: 05/17/2021 18:57     Assessment and plan- Patient is a 50 y.o. 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  He is s/p 5 cycles of weekly cisplatin chemotherapy with concurrent radiation treatment.  Chemo was stopped prematurely due to worsening mucositis and patient completed radiation treatment in April 2022.  He is here to discuss PET CT scan results and further management.  PET CT scan done 3 months after radiation treatment showsMarked interval response to treatment with mild residual asymmetric activity likely posttreatment.  No evidence of recurrent or metastatic disease.    I will  plan to follow this up with a repeat CT soft tissue neck in January to ensure stability.  Patient does report some soreness at the back of his throat but is not taking any narcotic pain medications or gabapentin at this time.  Patient will continue to follow-up with ENT as well.   Visit Diagnosis 1. Squamous cell carcinoma of oropharynx (HCC)      Dr. Randa Evens, MD,  [facility] [id number] 05/28/2021 11:54 AM

## 2021-07-04 ENCOUNTER — Encounter: Payer: Self-pay | Admitting: Radiation Oncology

## 2021-07-04 ENCOUNTER — Ambulatory Visit
Admission: RE | Admit: 2021-07-04 | Discharge: 2021-07-04 | Disposition: A | Discharge status: Home / Self Care | Payer: Medicaid Other | Source: Ambulatory Visit | Attending: Radiation Oncology | Admitting: Radiation Oncology

## 2021-07-04 DIAGNOSIS — Z923 Personal history of irradiation: Secondary | ICD-10-CM | POA: Insufficient documentation

## 2021-07-04 DIAGNOSIS — C01 Malignant neoplasm of base of tongue: Secondary | ICD-10-CM | POA: Insufficient documentation

## 2021-07-04 DIAGNOSIS — C109 Malignant neoplasm of oropharynx, unspecified: Secondary | ICD-10-CM

## 2021-07-04 DIAGNOSIS — Z9221 Personal history of antineoplastic chemotherapy: Secondary | ICD-10-CM | POA: Insufficient documentation

## 2021-07-04 NOTE — Progress Notes (Signed)
Radiation Oncology Follow up Note  Name: Paul Hebert   Date:   07/04/2021 MRN:  [id number] DOB: 1971/06/18    This 50 y.o. male presents to the clinic today for 63-monthfollow-up status post concurrent chemoradiation therapy for stage IVa (T2N2BM0) p16 positive squamous cell carcinoma left tongue base..Marland Kitchen REFERRING PROVIDER: No ref. provider found  HPI: Patient is a 50year old male now out 4 months having completed concurrent chemoradiation therapy for stage IVa p16 positive squamous cell carcinoma the tongue base.  Seen today in routine follow-up he is doing well taste is returned his saliva is improving he is having no dysphagia or head and neck pain.  He had a PET CT scan back in August showing no evidence of disease some mild residual hypermetabolic activity consistent with treatment.  He is not seeing ENT on a regular basis..  COMPLICATIONS OF TREATMENT: none  FOLLOW UP COMPLIANCE: keeps appointments   PHYSICAL EXAM:  BP (P) 115/90 (BP Location: Right Arm, Patient Position: Sitting)   Pulse (P) 96   Temp (P) 97.8 F (36.6 C) (Tympanic)   Resp (P) 16   Wt (P) 131 lb 4.8 oz (59.6 kg)   BMI (P) 21.19 kg/m  No evidence of cervical or supraclavicular adenopathy.  Well-developed well-nourished patient in NAD. HEENT reveals PERLA, EOMI, discs not visualized.  Oral cavity is clear. No oral mucosal lesions are identified. Neck is clear without evidence of cervical or supraclavicular adenopathy. Lungs are clear to A&P. Cardiac examination is essentially unremarkable with regular rate and rhythm without murmur rub or thrill. Abdomen is benign with no organomegaly or masses noted. Motor sensory and DTR levels are equal and symmetric in the upper and lower extremities. Cranial nerves II through XII are grossly intact. Proprioception is intact. No peripheral adenopathy or edema is identified. No motor or sensory levels are noted. Crude visual fields are within normal range.  RADIOLOGY RESULTS:  PET CT scan reviewed compatible with above-stated findings  PLAN: Present time patient is doing well no evidence of disease now out 4 months from concurrent chemoradiation therapy for locally advanced head and neck cancer.  I am pleased with his overall progress.  I have asked to see him back in 6 months for follow-up.  I have asked him to make a follow-up appoint with Dr. MTami Ribasfor continuation of care.  Patient knows to call with any concerns.  I would like to take this opportunity to thank you for allowing me to participate in the care of your patient..Noreene Filbert MD

## 2021-07-20 ENCOUNTER — Encounter: Payer: Self-pay | Admitting: Oncology

## 2021-07-26 ENCOUNTER — Encounter: Payer: Self-pay | Admitting: Oncology

## 2021-07-27 ENCOUNTER — Encounter: Payer: Self-pay | Admitting: Oncology

## 2021-08-11 DIAGNOSIS — F17210 Nicotine dependence, cigarettes, uncomplicated: Secondary | ICD-10-CM | POA: Insufficient documentation

## 2021-08-11 DIAGNOSIS — S80211A Abrasion, right knee, initial encounter: Secondary | ICD-10-CM | POA: Insufficient documentation

## 2021-08-11 DIAGNOSIS — Z85818 Personal history of malignant neoplasm of other sites of lip, oral cavity, and pharynx: Secondary | ICD-10-CM | POA: Insufficient documentation

## 2021-08-11 DIAGNOSIS — Z23 Encounter for immunization: Secondary | ICD-10-CM | POA: Diagnosis not present

## 2021-08-11 DIAGNOSIS — S99922A Unspecified injury of left foot, initial encounter: Secondary | ICD-10-CM | POA: Diagnosis present

## 2021-08-11 DIAGNOSIS — S9032XA Contusion of left foot, initial encounter: Secondary | ICD-10-CM | POA: Insufficient documentation

## 2021-08-12 ENCOUNTER — Emergency Department
Admission: EM | Admit: 2021-08-12 | Discharge: 2021-08-12 | Disposition: A | Discharge status: Home / Self Care | Payer: Medicaid Other | Attending: Emergency Medicine | Admitting: Emergency Medicine

## 2021-08-12 ENCOUNTER — Emergency Department: Payer: Medicaid Other

## 2021-08-12 ENCOUNTER — Other Ambulatory Visit: Payer: Self-pay

## 2021-08-12 DIAGNOSIS — S9032XA Contusion of left foot, initial encounter: Secondary | ICD-10-CM

## 2021-08-12 DIAGNOSIS — S80211A Abrasion, right knee, initial encounter: Secondary | ICD-10-CM

## 2021-08-12 MED ORDER — ONDANSETRON 4 MG PO TBDP
4.0000 mg | ORAL_TABLET | Freq: Once | ORAL | Status: AC
  Administered 2021-08-12: 4 mg via ORAL
  Filled 2021-08-12: qty 1

## 2021-08-12 MED ORDER — OXYCODONE-ACETAMINOPHEN 5-325 MG PO TABS
1.0000 | ORAL_TABLET | Freq: Once | ORAL | Status: AC
  Administered 2021-08-12: 1 via ORAL
  Filled 2021-08-12: qty 1

## 2021-08-12 MED ORDER — TETANUS-DIPHTH-ACELL PERTUSSIS 5-2.5-18.5 LF-MCG/0.5 IM SUSY
0.5000 mL | PREFILLED_SYRINGE | Freq: Once | INTRAMUSCULAR | Status: AC
  Administered 2021-08-12: 0.5 mL via INTRAMUSCULAR
  Filled 2021-08-12: qty 0.5

## 2021-08-12 MED ORDER — BACITRACIN ZINC 500 UNIT/GM EX OINT
TOPICAL_OINTMENT | Freq: Once | CUTANEOUS | Status: AC
  Administered 2021-08-12: 1 via TOPICAL
  Filled 2021-08-12: qty 0.9
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
## 2021-08-12 NOTE — Discharge Instructions (Addendum)
You may alternate Tylenol 1000 mg every 6 hours as needed for pain, fever and Ibuprofen 800 mg every 8 hours as needed for pain, fever.  Please take Ibuprofen with food.  Do not take more than 4000 mg of Tylenol (acetaminophen) in a 24 hour period.   Steps to find a Primary Care Provider (PCP):  Call [phone] or [phone] to access "[place]."  2.  You may also go on the [facility] website at [email or web address]
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
## 2021-09-06 ENCOUNTER — Emergency Department
Admission: EM | Admit: 2021-09-06 | Discharge: 2021-09-07 | Disposition: A | Discharge status: Home / Self Care | Payer: Medicaid Other | Attending: Emergency Medicine | Admitting: Emergency Medicine

## 2021-09-06 DIAGNOSIS — F1410 Cocaine abuse, uncomplicated: Secondary | ICD-10-CM | POA: Diagnosis not present

## 2021-09-06 DIAGNOSIS — F332 Major depressive disorder, recurrent severe without psychotic features: Secondary | ICD-10-CM | POA: Diagnosis present

## 2021-09-06 DIAGNOSIS — F1010 Alcohol abuse, uncomplicated: Secondary | ICD-10-CM | POA: Diagnosis not present

## 2021-09-06 DIAGNOSIS — Z85810 Personal history of malignant neoplasm of tongue: Secondary | ICD-10-CM | POA: Diagnosis not present

## 2021-09-06 DIAGNOSIS — Z20822 Contact with and (suspected) exposure to covid-19: Secondary | ICD-10-CM | POA: Diagnosis not present

## 2021-09-06 DIAGNOSIS — F17210 Nicotine dependence, cigarettes, uncomplicated: Secondary | ICD-10-CM | POA: Diagnosis not present

## 2021-09-06 DIAGNOSIS — F322 Major depressive disorder, single episode, severe without psychotic features: Secondary | ICD-10-CM | POA: Diagnosis present

## 2021-09-06 DIAGNOSIS — F1910 Other psychoactive substance abuse, uncomplicated: Secondary | ICD-10-CM | POA: Diagnosis not present

## 2021-09-06 DIAGNOSIS — Z7189 Other specified counseling: Secondary | ICD-10-CM

## 2021-09-06 DIAGNOSIS — F419 Anxiety disorder, unspecified: Secondary | ICD-10-CM | POA: Diagnosis not present

## 2021-09-06 LAB — COMPREHENSIVE METABOLIC PANEL
ALT: 21 U/L (ref 0–44)
AST: 35 U/L (ref 15–41)
Albumin: 4.1 g/dL (ref 3.5–5.0)
Alkaline Phosphatase: 70 U/L (ref 38–126)
Anion gap: 10 (ref 5–15)
BUN: 9 mg/dL (ref 6–20)
CO2: 25 mmol/L (ref 22–32)
Calcium: 9.9 mg/dL (ref 8.9–10.3)
Chloride: 97 mmol/L — ABNORMAL LOW (ref 98–111)
Creatinine, Ser: 0.99 mg/dL (ref 0.61–1.24)
GFR, Estimated: >60 mL/min (ref >60)
Glucose, Bld: 114 mg/dL — ABNORMAL HIGH (ref 70–99)
Potassium: 3.6 mmol/L (ref 3.5–5.1)
Sodium: 132 mmol/L — ABNORMAL LOW (ref 135–145)
Total Bilirubin: 1.1 mg/dL (ref 0.3–1.2)
Total Protein: 6.9 g/dL (ref 6.5–8.1)

## 2021-09-06 LAB — CBC
HCT: 40 % (ref 39.0–52.0)
Hemoglobin: 14.7 g/dL (ref 13.0–17.0)
MCH: 35 pg — ABNORMAL HIGH (ref 26.0–34.0)
MCHC: 36.8 g/dL — ABNORMAL HIGH (ref 30.0–36.0)
MCV: 95.2 fL (ref 80.0–100.0)
Platelets: 316 10*3/uL (ref 150–400)
RBC: 4.2 MIL/uL — ABNORMAL LOW (ref 4.22–5.81)
RDW: 12.5 % (ref 11.5–15.5)
WBC: 9.5 10*3/uL (ref 4.0–10.5)
nRBC: 0 % (ref 0.0–0.2)

## 2021-09-06 LAB — TROPONIN I (HIGH SENSITIVITY): Troponin I (High Sensitivity): 10 ng/L (ref <18)

## 2021-09-06 LAB — URINE DRUG SCREEN, QUALITATIVE (ARMC ONLY)
Amphetamines, Ur Screen: POSITIVE — AB
Barbiturates, Ur Screen: NOT DETECTED
Benzodiazepine, Ur Scrn: NOT DETECTED
Cannabinoid 50 Ng, Ur ~~LOC~~: POSITIVE — AB
Cocaine Metabolite,Ur ~~LOC~~: NOT DETECTED
MDMA (Ecstasy)Ur Screen: NOT DETECTED
Methadone Scn, Ur: NOT DETECTED
Opiate, Ur Screen: NOT DETECTED
Phencyclidine (PCP) Ur S: NOT DETECTED
Tricyclic, Ur Screen: NOT DETECTED

## 2021-09-06 LAB — ACETAMINOPHEN LEVEL: Acetaminophen (Tylenol), Serum: <10 ug/mL — ABNORMAL LOW (ref 10–30)

## 2021-09-06 LAB — ETHANOL: Alcohol, Ethyl (B): <10 mg/dL (ref <10)

## 2021-09-06 LAB — SALICYLATE LEVEL: Salicylate Lvl: <7 mg/dL — ABNORMAL LOW (ref 7.0–30.0)

## 2021-09-06 MED ORDER — LORAZEPAM 1 MG PO TABS
1.0000 mg | ORAL_TABLET | Freq: Once | ORAL | Status: AC
  Administered 2021-09-06: 1 mg via ORAL
  Filled 2021-09-06: qty 1

## 2021-09-06 MED ORDER — LORAZEPAM 2 MG PO TABS: 2.0000 mg | ORAL_TABLET | Freq: Once | ORAL | Status: DC
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
## 2021-09-06 NOTE — ED Notes (Signed)
Pt up to restroom. Gait steady.

## 2021-09-06 NOTE — ED Notes (Signed)
Pt requested sleep medication

## 2021-09-06 NOTE — ED Notes (Signed)
Pt was given snack

## 2021-09-06 NOTE — ED Notes (Signed)
Dr. Ellender Hose at the bedside for pt evaluation

## 2021-09-06 NOTE — ED Provider Notes (Signed)
[facility] Emergency Department Provider Note  ____________________________________________   Event Date/Time   First MD Initiated Contact with Patient 09/06/21 1958     (approximate)  I have reviewed the triage vital signs and the nursing notes.   HISTORY  Chief Complaint Drug Problem    HPI LOI RENNAKER is a 50 y.o. male  with h/o cocaine abuse, polysubstance abuse, here with ams. Pt arrives b/c he reports he has been "doing a lot of drugs." Has not slept in 3 days. Reportedly has been paranoid and hallucinating. He is anxious, stating he needs to find out about his fiance/girlfriend before he can calm down. Denies any SI, HI. Remainder of history limited 2/2 intoxication.  Level 5 caveat invoked as remainder of history, ROS, and physical exam limited due to patient's intoxication/mental status.         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   Goals of care, counseling/discussion 10/18/2020    Past Surgical History:  Procedure Laterality Date   PORT-A-CATH REMOVAL N/A 02/04/2021   Procedure: REMOVAL PORT-A-CATH;  Surgeon: Olean Ree, MD;  Location: [facility] ORS;  Service: General;  Laterality: N/A;   PORTA CATH INSERTION N/A 10/25/2020   Procedure: PORTA CATH INSERTION;  Surgeon: Algernon Huxley, MD;  Location: [facility];  Service: Cardiovascular;  Laterality: N/A;   REMOVAL OF GASTROSTOMY TUBE N/A 02/04/2021   Procedure: REMOVAL OF GASTROSTOMY TUBE;  Surgeon: Olean Ree, MD;  Location: [facility] ORS;  Service: General;  Laterality: N/A;    Prior to Admission medications   Not on File     Allergies Patient has no known allergies.  Family History  Problem Relation Age of Onset   Cancer Father     Social History Social History   Tobacco Use   Smoking status: Every Day    Packs/day: 1.00    Types: Cigarettes   Smokeless tobacco: Current   Tobacco comments:    Patches very rarely  Vaping Use   Vaping Use: Never used  Substance Use Topics   Alcohol use: Yes    Comment: 12  week   Drug use: Yes    Types: Marijuana, "Crack" cocaine    Comment: 2 months last time usine    Review of Systems  Review of Systems  Unable to perform ROS: Mental status change  Psychiatric/Behavioral:  Positive for decreased concentration and dysphoric mood.     ____________________________________________  PHYSICAL EXAM:      VITAL SIGNS: ED Triage Vitals [09/06/21 2032]  Enc Vitals Group     BP (!) 157/106     Pulse Rate (!) 106     Resp 20     Temp 98.6 F (37 C)     Temp Source Oral     SpO2 97 %     Weight      Height      Head Circumference      Peak Flow      Pain Score      Pain Loc      Pain Edu?      Excl. in [place]?      Physical Exam  Vitals and nursing note reviewed.  Constitutional:      General: He is not in acute distress.    Appearance: He is well-developed.  HENT:     Head: Normocephalic and atraumatic.  Eyes:     Conjunctiva/sclera: Conjunctivae normal.  Cardiovascular:     Rate and Rhythm: Normal rate and regular rhythm.     Heart sounds: Normal heart sounds. No murmur heard.   No friction rub.  Pulmonary:     Effort: Pulmonary effort is normal. No respiratory distress.     Breath sounds: Normal breath sounds. No wheezing or rales.  Abdominal:     General: There is no distension.     Palpations: Abdomen is soft.     Tenderness: There is no abdominal tenderness.  Musculoskeletal:     Cervical back: Neck supple.  Skin:    General: Skin is warm.     Capillary Refill: Capillary refill takes less than 2 seconds.  Neurological:      Mental Status: He is alert and oriented to person, place, and time.     Motor: No abnormal muscle tone.  Psychiatric:        Mood and Affect: Mood is anxious.      ____________________________________________   LABS (all labs ordered are listed, but only abnormal results are displayed)  Labs Reviewed  COMPREHENSIVE METABOLIC PANEL - Abnormal; Notable for the following components:      Result Value   Sodium 132 (*)    Chloride 97 (*)    Glucose, Bld 114 (*)    All other components within normal limits  SALICYLATE LEVEL - Abnormal; Notable for the following components:   Salicylate Lvl <3.2 (*)    All other components within normal limits  ACETAMINOPHEN LEVEL - Abnormal; Notable for the following components:   Acetaminophen (Tylenol), Serum <10 (*)    All other components within normal limits  CBC - Abnormal; Notable for the following components:   RBC 4.20 (*)    MCH 35.0 (*)    MCHC 36.8 (*)    All other components within normal limits  URINE DRUG SCREEN, QUALITATIVE ([facility] ONLY) - Abnormal; Notable for the following components:   Amphetamines, Ur Screen POSITIVE (*)    Cannabinoid 50 Ng, Ur [place] POSITIVE (*)    All other components within normal limits  RESP PANEL BY RT-PCR (FLU A&B, COVID) ARPGX2  ETHANOL  TROPONIN I (HIGH SENSITIVITY)  TROPONIN I (HIGH SENSITIVITY)    ____________________________________________  EKG: Sinus tachycardia, ventricular rate 105.  PR 178, QRS 90, QTc 449.  No acute ST elevations or depressions. ________________________________________  RADIOLOGY All imaging, including plain films, CT scans, and ultrasounds, independently reviewed by me, and interpretations confirmed via formal radiology reads.  ED MD interpretation:     Official radiology report(s): No results found.  ____________________________________________  PROCEDURES   Procedure(s) performed (including Critical  Care):  Procedures  ____________________________________________  INITIAL IMPRESSION / MDM / [place] / ED COURSE  As part of my medical decision making, I reviewed the following data within the [place] notes reviewed and incorporated, Old chart reviewed, Notes from prior ED visits, and [place] Controlled Substance Database       *CARRY ORTEZ was evaluated in Emergency Department on 09/06/2021 for the symptoms described in the history of present illness. He was evaluated in the context of the global COVID-19 pandemic, which necessitated consideration that the patient might be at risk for infection with the  SARS-CoV-2 virus that causes COVID-19. Institutional protocols and algorithms that pertain to the evaluation of patients at risk for COVID-19 are in a state of rapid change based on information released by regulatory bodies including the CDC and federal and state organizations. These policies and algorithms were followed during the patient's care in the ED.  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  Patient appears very anxious, denies any SI or HI.  Amphetamine screen is positive as well as THC.  No apparent emergent medical condition.  CBC and CMP unremarkable.  Patient given Ativan, will plan to reassess in the morning.  He is voluntary at this time.  ____________________________________________  FINAL CLINICAL IMPRESSION(S) / ED DIAGNOSES  Final diagnoses:  Polysubstance abuse ([place])     MEDICATIONS GIVEN DURING THIS VISIT:  Medications  LORazepam (ATIVAN) tablet 1 mg (1 mg Oral Given 09/06/21 2142)     ED Discharge Orders     None        Note:  This document was prepared using Dragon voice recognition software and may include unintentional dictation errors.   Duffy Bruce,  MD 09/06/21 [phone]

## 2021-09-06 NOTE — ED Notes (Signed)
Registration at bedside. Pt appears calm and cooperative. No distress noted.

## 2021-09-06 NOTE — ED Notes (Signed)
EKG and Labs completed.

## 2021-09-07 DIAGNOSIS — F332 Major depressive disorder, recurrent severe without psychotic features: Secondary | ICD-10-CM

## 2021-09-07 DIAGNOSIS — F1010 Alcohol abuse, uncomplicated: Secondary | ICD-10-CM

## 2021-09-07 LAB — RESP PANEL BY RT-PCR (FLU A&B, COVID) ARPGX2
Influenza A by PCR: NEGATIVE
Influenza B by PCR: NEGATIVE
SARS Coronavirus 2 by RT PCR: NEGATIVE

## 2021-09-07 NOTE — BH Assessment (Signed)
Comprehensive Clinical Assessment (CCA) Note  09/07/2021 Paul Hebert [id number]  Chief Complaint: Patient is 50 year old male presenting to [facility] ED voluntarily due to addiction issues. Per triage note Pt arrived via [facility] from local truck stop with c/o chest pain, with no description on pain when asked. Per EMS, pt homeless, has not slept in days. Has been using crack, meth and ETOH. Pt reports visual hallucinations. On arrival, pt unable to keep still, asking staff to call local police and have them go check on his girlfriend because he sts he seen something today he should not have and knows she is in danger. Pt continuing to ask for police to go check on girlfriend. Pt sts, "I know im high but I need to make sure she is safe. Call her and tell her to get out of there." During assessment patient appears alert and oriented x4, cooperative but somewhat irritable and guarded. When asked why patient was presenting to the ED "You should know, read about it." Patient eventually reports "I'm here because I was up for 2 or 3 days doing drugs." Patient reports that he smoke Methamphetamines and smokes Cocaine "addiction has ruined my life." Patient also reports "I've been up for 72 days, they said that I was having hallucinations." Patient denies SI, unable to report if he is experiencing HI.  Per Psyc NP Ysidro Evert patient to be reassessed  Chief Complaint  Patient presents with   Drug Problem   Visit Diagnosis: Amphetamine abuse, Depression    CCA Screening, Triage and Referral (STR)  Patient Reported Information How did you hear about [place]? Self  Referral name: Self  Referral phone number: No data recorded  Whom do you see for routine medical problems? No data recorded Practice/Facility Name: No data recorded Practice/Facility Phone Number: No data recorded Name of Contact: No data recorded Contact Number: No data recorded Contact Fax Number: No data recorded Prescriber Name: No data  recorded Prescriber Address (if known): No data recorded  What Is the Reason for Your Visit/Call Today? Patient presents voluntarily due to substance abuse  How Long Has This Been Causing You Problems? > than 6 months  What Do You Feel Would Help You the Most Today? Treatment for Depression or other mood problem; Alcohol or Drug Use Treatment   Have You Recently Been in Any Inpatient Treatment (Hospital/Detox/Crisis Center/28-Day Program)? No  Name/Location of Program/Hospital:No data recorded How Long Were You There? No data recorded When Were You Discharged? No data recorded  Have You Ever Received Services From [facility] Before? Yes  Who Do You See at [facility]? Medical and Mental Health Treatment   Have You Recently Had Any Thoughts About Hurting Yourself? No  Are You Planning to Commit Suicide/Harm Yourself At This time? No   Have you Recently Had Thoughts About [place]? Yes  Explanation: No data recorded  Have You Used Any Alcohol or Drugs in the Past 24 Hours? Yes  How Long Ago Did You Use Drugs or Alcohol? No data recorded What Did You Use and How Much? Amphetamines, Marijuana   Do You Currently Have a Therapist/Psychiatrist? No  Name of Therapist/Psychiatrist: No data recorded  Have You Been Recently Discharged From Any Office Practice or Programs? No  Explanation of Discharge From Practice/Program: No data recorded    CCA Screening Triage Referral Assessment Type of Contact: Face-to-Face  Is this Initial or Reassessment? No data recorded Date Telepsych consult ordered in [facility]:  No data recorded  Time Telepsych consult ordered in [facility]:  No data recorded  Patient Reported Information Reviewed? Yes  Patient Left Without Being Seen? No data recorded Reason for Not Completing Assessment: No data recorded  Collateral Involvement: n/a   Does Patient Have a Court Appointed Legal Guardian? No data recorded Name and Contact of Legal Guardian:  No data recorded If Minor and Not Living with Parent(s), Who has Custody? n/a  Is CPS involved or ever been involved? Never  Is APS involved or ever been involved? Never   Patient Determined To Be At Risk for Harm To Self or Others Based on Review of Patient Reported Information or Presenting Complaint? No  Method: No data recorded Availability of Means: No data recorded Intent: No data recorded Notification Required: No data recorded Additional Information for Danger to Others Potential: No data recorded Additional Comments for Danger to Others Potential: No data recorded Are There Guns or Other Weapons in Your Home? No data recorded Types of Guns/Weapons: No data recorded Are These Weapons Safely Secured?                            No data recorded Who Could Verify You Are Able To Have These Secured: No data recorded Do You Have any Outstanding Charges, Pending Court Dates, Parole/Probation? No data recorded Contacted To Inform of Risk of Harm To Self or Others: Other: Comment   [facility] of Assessment[facility]   Does Patient Present under Involuntary Commitment? No  IVC Papers Initial File Date: No data recorded  [place] of Residence: [place]   Patient Currently Receiving the Following Services: Not Receiving Services   Determination of Need: Emergent (2 hours)   Options For Referral: Inpatient Hospitalization     CCA Biopsychosocial Intake/Chief Complaint:  Increase symptoms of depression and having thoughts of ending his life  Current Symptoms/Problems: Thoughts of ending his life   Patient Reported Schizophrenia/Schizoaffective Diagnosis in Past: No   Strengths: Patient is able to communicate  Preferences: Help for his depression  Abilities: Able to care for his self   Type of Services Patient Feels are Needed: Inpatient treatment   Initial Clinical Notes/Concerns: Symptoms of depression   Mental Health Symptoms Depression:   Change in  energy/activity; Difficulty Concentrating; Fatigue; Hopelessness; Irritability   Duration of Depressive symptoms:  Greater than two weeks   Mania:   None   Anxiety:    Difficulty concentrating; Irritability; Restlessness   Psychosis:   Hallucinations   Duration of Psychotic symptoms:  Less than six months   Trauma:   None   Obsessions:   None   Compulsions:   None   Inattention:   None   Hyperactivity/Impulsivity:   None   Oppositional/Defiant Behaviors:   None   Emotional Irregularity:   None   Other Mood/Personality Symptoms:   Depression    Mental Status Exam Appearance and self-care  Stature:   Average   Weight:   Average weight   Clothing:   Disheveled   Grooming:   Neglected   Cosmetic use:   None   Posture/gait:   Normal   Motor activity:   Not Remarkable   Sensorium  Attention:   Normal   Concentration:   Normal   Orientation:   Person; Place   Recall/memory:   Defective in Short-term   Affect and Mood  Affect:   Flat   Mood:   Depressed; Irritable   Relating  Eye contact:  Normal   Facial expression:   Responsive   Attitude toward examiner:   Guarded; Irritable   Thought and Language  Speech flow:  Clear and Coherent   Thought content:   Appropriate to Mood and Circumstances   Preoccupation:   None   Hallucinations:   None   Organization:  No data recorded  [employer] of Knowledge:   Fair   Intelligence:   Average   Abstraction:   Functional   Judgement:   Impaired   Reality Testing:   Adequate   Insight:   Fair   Decision Making:   Impulsive   Social Functioning  Social Maturity:   Responsible   Social Judgement:   "Games developer"   Stress  Stressors:   Housing; Teacher, music Ability:   Advice worker Deficits:   None   Supports:   Support needed     Religion: Religion/Spirituality Are You A Religious Person?:  No  Leisure/Recreation: Leisure / Recreation Do You Have Hobbies?: No  Exercise/Diet: Exercise/Diet Do You Exercise?: No Have You Gained or Lost A Significant Amount of Weight in the Past Six Months?: No Do You Follow a Special Diet?: No Do You Have Any Trouble Sleeping?: Yes Explanation of Sleeping Difficulties: "I've been up for 72 hours"   CCA Employment/Education Employment/Work Situation: Employment / Work Situation Employment Situation: Unemployed Has Patient ever Been in Passenger transport manager?: No  Education: Education Is Patient Currently Attending School?: No Did You Have An Individualized Education Program (IIEP): No Did You Have Any Difficulty At [employer]?: No Patient's Education Has Been Impacted by Current Illness: No   CCA Family/Childhood History Family and Relationship History: Family history Marital status: Long term relationship Long term relationship, how long?: Unknown What types of issues is patient dealing with in the relationship?: Unknown Additional relationship information: Unknown Does patient have children?:  (Unknown)  Childhood History:  Childhood History Did patient suffer any verbal/emotional/physical/sexual abuse as a child?: No Did patient suffer from severe childhood neglect?: No Has patient ever been sexually abused/assaulted/raped as an adolescent or adult?: No Was the patient ever a victim of a crime or a disaster?: No Witnessed domestic violence?: No Has patient been affected by domestic violence as an adult?: No  Child/Adolescent Assessment:     CCA Substance Use Alcohol/Drug Use: Alcohol / Drug Use Pain Medications: See MAR Prescriptions: See MAR Over the Counter: See MAR History of alcohol / drug use?: Yes Substance #1 Name of Substance 1: Amphetamines 1 - Last Use / Amount: 09/06/21 Substance #2 Name of Substance 2: Marijuana                     ASAM's:  Six Dimensions of Multidimensional Assessment  Dimension  1:  Acute Intoxication and/or Withdrawal Potential:      Dimension 2:  Biomedical Conditions and Complications:      Dimension 3:  Emotional, Behavioral, or Cognitive Conditions and Complications:     Dimension 4:  Readiness to Change:     Dimension 5:  Relapse, Continued use, or Continued Problem Potential:     Dimension 6:  Recovery/Living Environment:     ASAM Severity Score:    ASAM Recommended Level of Treatment:     Substance use Disorder (SUD) Substance Use Disorder (SUD)  Checklist Symptoms of Substance Use: Continued use despite having a persistent/recurrent physical/psychological problem caused/exacerbated by use, Continued use despite persistent or recurrent social, interpersonal problems, caused or exacerbated by use,  Presence of craving or strong urge to use, Social, occupational, recreational activities given up or reduced due to use  Recommendations for Services/Supports/Treatments:    DSM5 Diagnoses: Patient Active Problem List   Diagnosis Date Noted   Severe recurrent major depression without psychotic features ([place]) 02/05/2021   Cocaine abuse ([place]) 01/28/2021   Alcohol abuse 01/28/2021   Severe major depression, single episode ([place]) 01/28/2021   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

## 2021-09-07 NOTE — Consult Note (Signed)
[facility] Face-to-Face Psychiatry Consult   Reason for Consult:  re-evaluation Referring Physician:  EDP Patient Identification: HATIM HOMANN MRN:  [id number] Principal Diagnosis: <principal problem not specified> Diagnosis:  Active Problems:   Goals of care, counseling/discussion   Cocaine abuse ([place])   Alcohol abuse   Severe major depression, single episode ([place])   Severe recurrent major depression without psychotic features ([place])   Total Time spent with patient: 15 minutes  Subjective: "I need to g oto work; I need to be discharged."   GIAVONNI FONDER is a 50 y.o. male patient admitted with addiction issues.   HPI:  See previous.  Patient THIS morning, clear and coherent.  He states that he knows he has a drug problem and he is going to go get help at [facility].  He states all of his delusional thinking as a result of his using drugs and alcohol. He also says he does have a job now and he needs to going to get to work.  He is denying any thoughts of self-harm, is no wish or intent to kill himself.  Denies auditory or visual hallucinations.  Denies homicidal ideations.  Patient expresses wish to be discharged.  He does not meet criteria for inpatient psychiatric hospitalization.  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[facility])     Past Surgical History:  Procedure Laterality Date   PORT-A-CATH REMOVAL N/A 02/04/2021   Procedure: REMOVAL PORT-A-CATH;  Surgeon: Olean Ree, MD;  Location: [facility] ORS;  Service: General;  Laterality: N/A;   PORTA CATH INSERTION N/A 10/25/2020   Procedure: PORTA CATH INSERTION;  Surgeon: Algernon Huxley, MD;  Location: [facility] [place] [facility];  Service: Cardiovascular;  Laterality: N/A;   REMOVAL OF GASTROSTOMY TUBE N/A 02/04/2021   Procedure: REMOVAL OF GASTROSTOMY TUBE;  Surgeon: Olean Ree, MD;   Location: [facility] ORS;  Service: General;  Laterality: N/A;   Family History:  Family History  Problem Relation Age of Onset   Cancer Father    Family Psychiatric  History: see previous Social History:  Social History   Substance and Sexual Activity  Alcohol Use Yes   Comment: 12  week     Social History   Substance and Sexual Activity  Drug Use Yes   Types: Marijuana, "Crack" cocaine   Comment: 2 months last time usine    Social History   Socioeconomic History   Marital status: Single    Spouse name: Not on file   Number of children: Not on file   Years of education: Not on file   Highest education level: Not on file  Occupational History   Not on file  Tobacco Use   Smoking status: Every Day    Packs/day: 1.00    Types: Cigarettes   Smokeless tobacco: Current   Tobacco comments:    Patches very rarely  Vaping Use   Vaping Use: Never used  Substance and Sexual Activity   Alcohol use: Yes    Comment: 12  week   Drug use: Yes    Types: Marijuana, "Crack" cocaine    Comment: 2 months last time usine   Sexual activity: Not Currently  Other Topics Concern   Not on file  Social History Narrative   Not on file   Social Determinants of Health  Financial Resource Strain: Not on file  Food Insecurity: Not on file  Transportation Needs: Not on file  Physical Activity: Not on file  Stress: Not on file  Social Connections: Not on file   Additional Social History:    Allergies:  No Known Allergies  Labs:  Results for orders placed or performed during the hospital encounter of 09/06/21 (from the past 48 hour(s))  Comprehensive metabolic panel     Status: Abnormal   Collection Time: 09/06/21  8:35 PM  Result Value Ref Range   Sodium 132 (L) 135 - 145 mmol/L   Potassium 3.6 3.5 - 5.1 mmol/L   Chloride 97 (L) 98 - 111 mmol/L   CO2 25 22 - 32 mmol/L   Glucose, Bld 114 (H) 70 - 99 mg/dL    Comment: Glucose reference range applies only to samples taken after  fasting for at least 8 hours.   BUN 9 6 - 20 mg/dL   Creatinine, Ser 0.99 0.61 - 1.24 mg/dL   Calcium 9.9 8.9 - 10.3 mg/dL   Total Protein 6.9 6.5 - 8.1 g/dL   Albumin 4.1 3.5 - 5.0 g/dL   AST 35 15 - 41 U/L   ALT 21 0 - 44 U/L   Alkaline Phosphatase 70 38 - 126 U/L   Total Bilirubin 1.1 0.3 - 1.2 mg/dL   GFR, Estimated >60 >60 mL/min    Comment: (NOTE) Calculated using the CKD-EPI Creatinine Equation (2021)    Anion gap 10 5 - 15    Comment: Performed at [facility], [address] [place], [place] [address]  Ethanol     Status: None   Collection Time: 09/06/21  8:35 PM  Result Value Ref Range   Alcohol, Ethyl (B) <10 <10 mg/dL    Comment: (NOTE) Lowest detectable limit for serum alcohol is 10 mg/dL.  For medical purposes only. Performed at [facility], [address] [place], [place] [address]   Salicylate level     Status: Abnormal   Collection Time: 09/06/21  8:35 PM  Result Value Ref Range   Salicylate Lvl <1.2 (L) 7.0 - 30.0 mg/dL    Comment: Performed at [facility], [address] [place], [place] [address]  Acetaminophen level     Status: Abnormal   Collection Time: 09/06/21  8:35 PM  Result Value Ref Range   Acetaminophen (Tylenol), Serum <10 (L) 10 - 30 ug/mL    Comment: (NOTE) Therapeutic concentrations vary significantly. A range of 10-30 ug/mL  may be an effective concentration for many patients. However, some  are best treated at concentrations outside of this range. Acetaminophen concentrations >150 ug/mL at 4 hours after ingestion  and >50 ug/mL at 12 hours after ingestion are often associated with  toxic reactions.  Performed at [facility], [address] [place], [place] [address]   cbc     Status: Abnormal   Collection Time: 09/06/21  8:35 PM  Result Value Ref Range   WBC 9.5 4.0 - 10.5 K/uL   RBC 4.20 (L) 4.22 - 5.81 MIL/uL   Hemoglobin 14.7 13.0 - 17.0 g/dL   HCT 40.0 39.0 - 52.0 %   MCV 95.2  80.0 - 100.0 fL   MCH 35.0 (H) 26.0 - 34.0 pg   MCHC 36.8 (H) 30.0 - 36.0 g/dL   RDW 12.5 11.5 - 15.5 %   Platelets 316 150 - 400 K/uL   nRBC 0.0 0.0 - 0.2 %    Comment:  Performed at [facility], [address] [place], [place] [address]  Urine Drug Screen, Qualitative     Status: Abnormal   Collection Time: 09/06/21  8:35 PM  Result Value Ref Range   Tricyclic, Ur Screen NONE DETECTED NONE DETECTED   Amphetamines, Ur Screen POSITIVE (A) NONE DETECTED   MDMA (Ecstasy)Ur Screen NONE DETECTED NONE DETECTED   Cocaine Metabolite,Ur [place] NONE DETECTED NONE DETECTED   Opiate, Ur Screen NONE DETECTED NONE DETECTED   Phencyclidine (PCP) Ur S NONE DETECTED NONE DETECTED   Cannabinoid 50 Ng, Ur [place] POSITIVE (A) NONE DETECTED   Barbiturates, Ur Screen NONE DETECTED NONE DETECTED   Benzodiazepine, Ur Scrn NONE DETECTED NONE DETECTED   Methadone Scn, Ur NONE DETECTED NONE DETECTED    Comment: (NOTE) Tricyclics + metabolites, urine    Cutoff 1000 ng/mL Amphetamines + metabolites, urine  Cutoff 1000 ng/mL MDMA (Ecstasy), urine              Cutoff 500 ng/mL Cocaine Metabolite, urine          Cutoff 300 ng/mL Opiate + metabolites, urine        Cutoff 300 ng/mL Phencyclidine (PCP), urine         Cutoff 25 ng/mL Cannabinoid, urine                 Cutoff 50 ng/mL Barbiturates + metabolites, urine  Cutoff 200 ng/mL Benzodiazepine, urine              Cutoff 200 ng/mL Methadone, urine                   Cutoff 300 ng/mL  The urine drug screen provides only a preliminary, unconfirmed analytical test result and should not be used for non-medical purposes. Clinical consideration and professional judgment should be applied to any positive drug screen result due to possible interfering substances. A more specific alternate chemical method must be used in order to obtain a confirmed analytical result. Gas chromatography / mass spectrometry (GC/MS) is the preferred confirm atory method. Performed  at [facility], [place], [place] [address]   Troponin I (High Sensitivity)     Status: None   Collection Time: 09/06/21  8:35 PM  Result Value Ref Range   Troponin I (High Sensitivity) 10 <18 ng/L    Comment: (NOTE) Elevated high sensitivity troponin I (hsTnI) values and significant  changes across serial measurements may suggest ACS but many other  chronic and acute conditions are known to elevate hsTnI results.  Refer to the "Links" section for chest pain algorithms and additional  guidance. Performed at [facility], [address] [place],  [address]   Resp Panel by RT-PCR (Flu A&B, Covid) Nasopharyngeal Swab     Status: None   Collection Time: 09/06/21  8:47 PM   Specimen: Nasopharyngeal Swab; Nasopharyngeal(NP) swabs in vial transport medium  Result Value Ref Range   SARS Coronavirus 2 by RT PCR NEGATIVE NEGATIVE    Comment: (NOTE) SARS-CoV-2 target nucleic acids are NOT DETECTED.  The SARS-CoV-2 RNA is generally detectable in upper respiratory specimens during the acute phase of infection. The lowest concentration of SARS-CoV-2 viral copies this assay can detect is 138 copies/mL. A negative result does not preclude SARS-Cov-2 infection and should not be used as the sole basis for treatment or other patient management decisions. A negative result may occur with  improper specimen collection/handling, submission of specimen other than nasopharyngeal swab, presence of viral  mutation(s) within the areas targeted by this assay, and inadequate number of viral copies(<138 copies/mL). A negative result must be combined with clinical observations, patient history, and epidemiological information. The expected result is Negative.  Fact Sheet for Patients:  EntrepreneurPulse.com.au  Fact Sheet for Healthcare Providers:  IncredibleEmployment.be  This test is no t yet approved or cleared by the [place] FDA and  has been authorized for detection and/or diagnosis of SARS-CoV-2 by FDA under an Emergency Use Authorization (EUA). This EUA will remain  in effect (meaning this test can be used) for the duration of the COVID-19 declaration under Section 564(b)(1) of the Act, 21 U.S.C.section 360bbb-3(b)(1), unless the authorization is terminated  or revoked sooner.       Influenza A by PCR NEGATIVE NEGATIVE   Influenza B by PCR NEGATIVE NEGATIVE    Comment: (NOTE) The Xpert Xpress SARS-CoV-2/FLU/RSV plus assay is intended as an aid in the diagnosis of influenza from Nasopharyngeal swab specimens and should not be used as a sole basis for treatment. Nasal washings and aspirates are unacceptable for Xpert Xpress SARS-CoV-2/FLU/RSV testing.  Fact Sheet for Patients: EntrepreneurPulse.com.au  Fact Sheet for Healthcare Providers: IncredibleEmployment.be  This test is not yet approved or cleared by the [place] FDA and has been authorized for detection and/or diagnosis of SARS-CoV-2 by FDA under an Emergency Use Authorization (EUA). This EUA will remain in effect (meaning this test can be used) for the duration of the COVID-19 declaration under Section 564(b)(1) of the Act, 21 U.S.C. section 360bbb-3(b)(1), unless the authorization is terminated or revoked.  Performed at [facility], [address] [place], [place] [address]     No current facility-administered medications for this encounter.   No current outpatient medications on file.    Musculoskeletal: Strength & Muscle Tone: within normal limits Gait & Station: normal Patient leans: N/A  Psychiatric Specialty Exam:  Presentation  General Appearance: Appropriate for Environment  Eye Contact:Good  Speech:Clear and Coherent  Speech Volume:Normal  Handedness:Right   Mood and Affect  Mood:Euthymic  Affect:Appropriate   Thought Process  Thought  Processes:Coherent  Descriptions of Associations:Intact  Orientation:Full (Time, Place and Person)  Thought Content:WDL  History of Schizophrenia/Schizoaffective disorder:No  Duration of Psychotic Symptoms:Less than six months  Hallucinations:Hallucinations: None Description of Auditory Hallucinations: none Description of Visual Hallucinations: none  Ideas of Reference:None  Suicidal Thoughts:Suicidal Thoughts: No  Homicidal Thoughts:Homicidal Thoughts: No   Sensorium  Memory:Immediate Good  Judgment:Fair  Insight:Fair   Executive Functions  Concentration:Fair  Attention Span:Good  Barber  Language:Good   Psychomotor Activity  Psychomotor Activity:Psychomotor Activity: Normal   Assets  Assets:Desire for Improvement; Resilience; Physical Health   Sleep  Sleep:Sleep: Good   Physical Exam: Physical Exam ROS Blood pressure (!) 146/95, pulse (!) 110, temperature 98.3 F (36.8 C), temperature source Oral, resp. rate 16, SpO2 100 %. There is no height or weight on file to calculate BMI.  Treatment Plan Summary: Plan 50 year old male presented to ED under influence of amphetamines and THC,  alcohol. Patient was confused and had visual hallucinations. This morning, patient is clear, coherent, denies SI/HI/AVH. Is requesting discharge. Reviewed with EDP. Does not meet psychiatric inpatient admission criteria. Will D/C with resources to [facility].  Disposition: No evidence of imminent risk to self or others at present.   Discussed crisis plan, support from social network, calling 911, coming to the Emergency Department, and calling Suicide Hotline.  Sherlon Handing, NP 09/07/2021 2:04 PM

## 2021-09-07 NOTE — ED Notes (Signed)
Pt asking what time he is going to be seen by a doctor. Pt state" what do I got to do to get out of here, Im ready to go".

## 2021-09-07 NOTE — ED Notes (Signed)
NT returning pt belongings at this time.

## 2021-09-07 NOTE — Consult Note (Signed)
[facility] Face-to-Face Psychiatry Consult   Reason for Consult: Drug Problem Referring Physician: Dr. Ellender Hose Patient Identification: Paul Hebert MRN:  [id number] Principal Diagnosis: <principal problem not specified> Diagnosis:  Active Problems:   Goals of care, counseling/discussion   Cocaine abuse ([place])   Alcohol abuse   Severe major depression, single episode ([place])   Severe recurrent major depression without psychotic features ([place])   Total Time spent with patient: 1 hour  Subjective: "You should know; read about it." Paul Hebert is a 50 y.o. male patient presented to [facility] ED  voluntarily due to addiction issues. Per the triage note, the patient arrived via [facility] from a local truck stop when he began complaining of chest pain. Per EMS, the patient is homeless and has not slept in days. The patient has been using crack, meth, and ETOH. The patient reports visual hallucinations. On arrival, pt unable to keep still, asking staff to call the local police and have them check on his girlfriend because he sts he has seen something today he should not have and knew she was in danger. The patient continued to ask for police to check on his girlfriend. The patient said, "I know I'm high, but I must ensure she is safe. Call her and tell her to get out of there." The patient was seen face-to-face by this provider; the chart was reviewed and consulted with Dr.Isaacs on 09/06/2021 due to the patient's care. It was discussed with the EDP that the patient would remain under observation overnight and be reassessed in the a.m. to determine if he meets the criteria for psychiatric inpatient admission; he could be discharged home. On evaluation, the patient is alert and oriented x4, irritated, uncooperative, and mood-congruent with affect. The patient does not appear to be responding to internal or external stimuli--the patient presents with some delusional thinking. The patient denies auditory or visual  hallucinations. The patient denies any suicidal, homicidal, or self-harm ideations. The patient is presenting with some psychotic and paranoid behaviors. The patient continued to ask for police to check on his girlfriend. The patient said, "I know I'm high, but I must ensure she is safe. Call her and tell her to get out of there." When asked why the patient presented to the ED, "You should know; read about it." The patient eventually reports, "I'm here because I was up for 2 or 3 days doing drugs." The patient says that he smokes Methamphetamines and smokes Cocaine "addiction has ruined my life." The patient reports, "I've been up for 72 days; they said I was having hallucinations."    HPI: Per Dr. Ellender Hose, Paul Hebert is a 50 y.o. male  with h/o cocaine abuse, polysubstance abuse, here with ams. Pt arrives b/c he reports he has been "doing a lot of drugs." Has not slept in 3 days. Reportedly has been paranoid and hallucinating. He is anxious, stating he needs to find out about his fiance/girlfriend before he can calm down. Denies any SI, HI. Remainder of history limited 2/2 intoxication.   Level 5 caveat invoked as remainder of history, ROS, and physical exam limited due to patient's intoxication/mental status.  Past Psychiatric History: History reviewed. 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[facility])     Past Surgical History:  Procedure Laterality Date  PORT-A-CATH REMOVAL N/A 02/04/2021   Procedure: REMOVAL PORT-A-CATH;  Surgeon: Olean Ree, MD;  Location: [facility] ORS;  Service: General;  Laterality: N/A;   PORTA CATH INSERTION N/A 10/25/2020   Procedure: PORTA CATH INSERTION;  Surgeon: Algernon Huxley, MD;  Location: [place] CV LAB;  Service: Cardiovascular;  Laterality: N/A;   REMOVAL OF GASTROSTOMY TUBE N/A 02/04/2021    Procedure: REMOVAL OF GASTROSTOMY TUBE;  Surgeon: Olean Ree, MD;  Location: [facility] ORS;  Service: General;  Laterality: N/A;   Family History:  Family History  Problem Relation Age of Onset   Cancer Father    Family Psychiatric  History:  Social History:  Social History   Substance and Sexual Activity  Alcohol Use Yes   Comment: 12  week     Social History   Substance and Sexual Activity  Drug Use Yes   Types: Marijuana, "Crack" cocaine   Comment: 2 months last time usine    Social History   Socioeconomic History   Marital status: Single    Spouse name: Not on file   Number of children: Not on file   Years of education: Not on file   Highest education level: Not on file  Occupational History   Not on file  Tobacco Use   Smoking status: Every Day    Packs/day: 1.00    Types: Cigarettes   Smokeless tobacco: Current   Tobacco comments:    Patches very rarely  Vaping Use   Vaping Use: Never used  Substance and Sexual Activity   Alcohol use: Yes    Comment: 12  week   Drug use: Yes    Types: Marijuana, "Crack" cocaine    Comment: 2 months last time usine   Sexual activity: Not Currently  Other Topics Concern   Not on file  Social History Narrative   Not on file   Social Determinants of Health   Financial Resource Strain: Not on file  Food Insecurity: Not on file  Transportation Needs: Not on file  Physical Activity: Not on file  Stress: Not on file  Social Connections: Not on file   Additional Social History:    Allergies:  No Known Allergies  Labs:  Results for orders placed or performed during the hospital encounter of 09/06/21 (from the past 48 hour(s))  Comprehensive metabolic panel     Status: Abnormal   Collection Time: 09/06/21  8:35 PM  Result Value Ref Range   Sodium 132 (L) 135 - 145 mmol/L   Potassium 3.6 3.5 - 5.1 mmol/L   Chloride 97 (L) 98 - 111 mmol/L   CO2 25 22 - 32 mmol/L   Glucose, Bld 114 (H) 70 - 99 mg/dL    Comment:  Glucose reference range applies only to samples taken after fasting for at least 8 hours.   BUN 9 6 - 20 mg/dL   Creatinine, Ser 0.99 0.61 - 1.24 mg/dL   Calcium 9.9 8.9 - 10.3 mg/dL   Total Protein 6.9 6.5 - 8.1 g/dL   Albumin 4.1 3.5 - 5.0 g/dL   AST 35 15 - 41 U/L   ALT 21 0 - 44 U/L   Alkaline Phosphatase 70 38 - 126 U/L   Total Bilirubin 1.1 0.3 - 1.2 mg/dL   GFR, Estimated >60 >60 mL/min    Comment: (NOTE) Calculated using the CKD-EPI Creatinine Equation (2021)    Anion gap 10 5 - 15    Comment: Performed at [facility], [address] [place], [place] [address]  Ethanol     Status: None   Collection Time: 09/06/21  8:35 PM  Result Value Ref Range   Alcohol, Ethyl (B) <10 <10 mg/dL    Comment: (NOTE) Lowest detectable limit for serum alcohol is 10 mg/dL.  For medical purposes only. Performed at [facility], [address] [place], [place] [address]   Salicylate level     Status: Abnormal   Collection Time: 09/06/21  8:35 PM  Result Value Ref Range   Salicylate Lvl <0.9 (L) 7.0 - 30.0 mg/dL    Comment: Performed at [facility], [address] [place], [place] [address]  Acetaminophen level     Status: Abnormal   Collection Time: 09/06/21  8:35 PM  Result Value Ref Range   Acetaminophen (Tylenol), Serum <10 (L) 10 - 30 ug/mL    Comment: (NOTE) Therapeutic concentrations vary significantly. A range of 10-30 ug/mL  may be an effective concentration for many patients. However, some  are best treated at concentrations outside of this range. Acetaminophen concentrations >150 ug/mL at 4 hours after ingestion  and >50 ug/mL at 12 hours after ingestion are often associated with  toxic reactions.  Performed at [facility], [address] [place], [place] [address]   cbc     Status: Abnormal   Collection Time: 09/06/21  8:35 PM  Result Value Ref Range   WBC 9.5 4.0 - 10.5 K/uL   RBC 4.20 (L) 4.22 - 5.81 MIL/uL   Hemoglobin  14.7 13.0 - 17.0 g/dL   HCT 40.0 39.0 - 52.0 %   MCV 95.2 80.0 - 100.0 fL   MCH 35.0 (H) 26.0 - 34.0 pg   MCHC 36.8 (H) 30.0 - 36.0 g/dL   RDW 12.5 11.5 - 15.5 %   Platelets 316 150 - 400 K/uL   nRBC 0.0 0.0 - 0.2 %    Comment: Performed at [facility] [address] [place], [place] [address]  Urine Drug Screen, Qualitative     Status: Abnormal   Collection Time: 09/06/21  8:35 PM  Result Value Ref Range   Tricyclic, Ur Screen NONE DETECTED NONE DETECTED   Amphetamines, Ur Screen POSITIVE (A) NONE DETECTED   MDMA (Ecstasy)Ur Screen NONE DETECTED NONE DETECTED   Cocaine Metabolite,Ur [place] NONE DETECTED NONE DETECTED   Opiate, Ur Screen NONE DETECTED NONE DETECTED   Phencyclidine (PCP) Ur S NONE DETECTED NONE DETECTED   Cannabinoid 50 Ng, Ur [place] POSITIVE (A) NONE DETECTED   Barbiturates, Ur Screen NONE DETECTED NONE DETECTED   Benzodiazepine, Ur Scrn NONE DETECTED NONE DETECTED   Methadone Scn, Ur NONE DETECTED NONE DETECTED    Comment: (NOTE) Tricyclics + metabolites, urine    Cutoff 1000 ng/mL Amphetamines + metabolites, urine  Cutoff 1000 ng/mL MDMA (Ecstasy), urine              Cutoff 500 ng/mL Cocaine Metabolite, urine          Cutoff 300 ng/mL Opiate + metabolites, urine        Cutoff 300 ng/mL Phencyclidine (PCP), urine         Cutoff 25 ng/mL Cannabinoid, urine                 Cutoff 50 ng/mL Barbiturates + metabolites, urine  Cutoff 200 ng/mL Benzodiazepine, urine              Cutoff 200 ng/mL Methadone, urine  Cutoff 300 ng/mL  The urine drug screen provides only a preliminary, unconfirmed analytical test result and should not be used for non-medical purposes. Clinical consideration and professional judgment should be applied to any positive drug screen result due to possible interfering substances. A more specific alternate chemical method must be used in order to obtain a confirmed analytical result. Gas chromatography / mass spectrometry  (GC/MS) is the preferred confirm atory method. Performed at [facility], [address], [place] [address]   Troponin I (High Sensitivity)     Status: None   Collection Time: 09/06/21  8:35 PM  Result Value Ref Range   Troponin I (High Sensitivity) 10 <18 ng/L    Comment: (NOTE) Elevated high sensitivity troponin I (hsTnI) values and significant  changes across serial measurements may suggest ACS but many other  chronic and acute conditions are known to elevate hsTnI results.  Refer to the "Links" section for chest pain algorithms and additional  guidance. Performed at [facility], [address] [place], [place] [address]   Resp Panel by RT-PCR (Flu A&B, Covid) Nasopharyngeal Swab     Status: None   Collection Time: 09/06/21  8:47 PM   Specimen: Nasopharyngeal Swab; Nasopharyngeal(NP) swabs in vial transport medium  Result Value Ref Range   SARS Coronavirus 2 by RT PCR NEGATIVE NEGATIVE    Comment: (NOTE) SARS-CoV-2 target nucleic acids are NOT DETECTED.  The SARS-CoV-2 RNA is generally detectable in upper respiratory specimens during the acute phase of infection. The lowest concentration of SARS-CoV-2 viral copies this assay can detect is 138 copies/mL. A negative result does not preclude SARS-Cov-2 infection and should not be used as the sole basis for treatment or other patient management decisions. A negative result may occur with  improper specimen collection/handling, submission of specimen other than nasopharyngeal swab, presence of viral mutation(s) within the areas targeted by this assay, and inadequate number of viral copies(<138 copies/mL). A negative result must be combined with clinical observations, patient history, and epidemiological information. The expected result is Negative.  Fact Sheet for Patients:  EntrepreneurPulse.com.au  Fact Sheet for Healthcare Providers:   IncredibleEmployment.be  This test is no t yet approved or cleared by the [place] FDA and  has been authorized for detection and/or diagnosis of SARS-CoV-2 by FDA under an Emergency Use Authorization (EUA). This EUA will remain  in effect (meaning this test can be used) for the duration of the COVID-19 declaration under Section 564(b)(1) of the Act, 21 U.S.C.section 360bbb-3(b)(1), unless the authorization is terminated  or revoked sooner.       Influenza A by PCR NEGATIVE NEGATIVE   Influenza B by PCR NEGATIVE NEGATIVE    Comment: (NOTE) The Xpert Xpress SARS-CoV-2/FLU/RSV plus assay is intended as an aid in the diagnosis of influenza from Nasopharyngeal swab specimens and should not be used as a sole basis for treatment. Nasal washings and aspirates are unacceptable for Xpert Xpress SARS-CoV-2/FLU/RSV testing.  Fact Sheet for Patients: EntrepreneurPulse.com.au  Fact Sheet for Healthcare Providers: IncredibleEmployment.be  This test is not yet approved or cleared by the [place] FDA and has been authorized for detection and/or diagnosis of SARS-CoV-2 by FDA under an Emergency Use Authorization (EUA). This EUA will remain in effect (meaning this test can be used) for the duration of the COVID-19 declaration under Section 564(b)(1) of the Act, 21 U.S.C. section 360bbb-3(b)(1), unless the authorization is terminated or revoked.  Performed at [facility], [address] [place],  [place] [address]     No current facility-administered medications for this encounter.   No current outpatient medications on file.    Musculoskeletal: Strength & Muscle Tone: within normal limits Gait & Station: normal Patient leans: N/A  Psychiatric Specialty Exam:  Presentation  General Appearance: Bizarre  Eye Contact:Minimal  Speech:Normal Rate  Speech Volume:Normal  Handedness:Right   Mood and  Affect  Mood:Anxious; Depressed  Affect:Congruent   Thought Process  Thought Processes:Goal Directed  Descriptions of Associations:Intact  Orientation:Full (Time, Place and Person)  Thought Content:Logical  History of Schizophrenia/Schizoaffective disorder:No  Duration of Psychotic Symptoms:Less than six months  Hallucinations:Hallucinations: Visual Description of Auditory Hallucinations: Unknown  Ideas of Reference:None  Suicidal Thoughts:Suicidal Thoughts: No  Homicidal Thoughts:Homicidal Thoughts: No   Sensorium  Memory:Immediate Good; Recent Good; Remote Good  Judgment:Good  Insight:Fair   Executive Functions  Concentration:Good  Attention Span:Good  [place] of Knowledge:Good  Language:Good   Psychomotor Activity  Psychomotor Activity:Psychomotor Activity: Normal   Assets  Assets:Communication Skills; Desire for Improvement; Housing; Catering manager; Physical Health; Resilience; Social Support   Sleep  Sleep:Sleep: Fair   Physical Exam: Physical Exam Vitals and nursing note reviewed.  Constitutional:      Appearance: Normal appearance. He is normal weight.  HENT:     Head: Normocephalic and atraumatic.     Right Ear: External ear normal.     Left Ear: External ear normal.     Nose: Nose normal.  Cardiovascular:     Rate and Rhythm: Normal rate.  Pulmonary:     Effort: Pulmonary effort is normal.  Musculoskeletal:        General: Normal range of motion.     Cervical back: Normal range of motion and neck supple.  Neurological:     General: No focal deficit present.     Mental Status: He is alert and oriented to person, place, and time.  Psychiatric:        Attention and Perception: Attention normal. He perceives auditory hallucinations.        Mood and Affect: Mood is anxious and depressed.        Speech: Speech normal.        Behavior: Behavior is uncooperative, agitated and withdrawn.        Thought  Content: Thought content is paranoid and delusional.        Cognition and Memory: Cognition and memory normal.        Judgment: Judgment is impulsive and inappropriate.   Review of Systems  Psychiatric/Behavioral:  Positive for depression and substance abuse. The patient is nervous/anxious.   All other systems reviewed and are negative. Blood pressure (!) 157/106, pulse (!) 106, temperature 98.6 F (37 C), temperature source Oral, resp. rate 20, SpO2 97 %. There is no height or weight on file to calculate BMI.  Treatment Plan Summary: Daily contact with patient to assess and evaluate symptoms and progress in treatment and Plan the patient would remained under observation overnight and will be reassessed in the a.m. to determine if he meets the criteria for psychiatric inpatient admission; he could be discharged home.  Disposition: No evidence of imminent risk to self or others at present.   Supportive therapy provided about ongoing stressors. The patient would remained under observation overnight and will be reassessed in the a.m. to determine if he meets the criteria for psychiatric inpatient admission; he could be discharged home.  Caroline Sauger, NP 09/07/2021 1:26 AM

## 2021-09-07 NOTE — ED Notes (Signed)
VOL/Consult completed/ Pending reassessment in Am possible D/C

## 2021-09-07 NOTE — ED Provider Notes (Signed)
Patient seen by psychiatry service during morning rounds.  Cleared for discharge.  Discharged in stable condition.   Lucrezia Starch, MD 09/07/21 1012

## 2021-09-12 ENCOUNTER — Encounter: Payer: Self-pay | Admitting: Oncology

## 2021-09-16 ENCOUNTER — Encounter: Payer: Self-pay | Admitting: Oncology

## 2021-09-16 NOTE — Telephone Encounter (Signed)
See previous note from 04/11/21, signing note

## 2021-10-03 ENCOUNTER — Other Ambulatory Visit: Payer: Self-pay

## 2021-10-03 ENCOUNTER — Emergency Department
Admission: EM | Admit: 2021-10-03 | Discharge: 2021-10-04 | Disposition: A | Discharge status: Home / Self Care | Payer: Medicaid Other | Attending: Emergency Medicine | Admitting: Emergency Medicine

## 2021-10-03 DIAGNOSIS — F19959 Other psychoactive substance use, unspecified with psychoactive substance-induced psychotic disorder, unspecified: Secondary | ICD-10-CM | POA: Diagnosis not present

## 2021-10-03 DIAGNOSIS — F600 Paranoid personality disorder: Secondary | ICD-10-CM | POA: Diagnosis not present

## 2021-10-03 DIAGNOSIS — Z85818 Personal history of malignant neoplasm of other sites of lip, oral cavity, and pharynx: Secondary | ICD-10-CM | POA: Diagnosis not present

## 2021-10-03 DIAGNOSIS — Z046 Encounter for general psychiatric examination, requested by authority: Secondary | ICD-10-CM | POA: Diagnosis not present

## 2021-10-03 DIAGNOSIS — R259 Unspecified abnormal involuntary movements: Secondary | ICD-10-CM | POA: Insufficient documentation

## 2021-10-03 DIAGNOSIS — Z20822 Contact with and (suspected) exposure to covid-19: Secondary | ICD-10-CM | POA: Insufficient documentation

## 2021-10-03 DIAGNOSIS — F17210 Nicotine dependence, cigarettes, uncomplicated: Secondary | ICD-10-CM | POA: Diagnosis not present

## 2021-10-03 LAB — LACTIC ACID, PLASMA: Lactic Acid, Venous: 1.4 mmol/L (ref 0.5–1.9)

## 2021-10-03 LAB — COMPREHENSIVE METABOLIC PANEL
ALT: 21 U/L (ref 0–44)
AST: 38 U/L (ref 15–41)
Albumin: 4.6 g/dL (ref 3.5–5.0)
Alkaline Phosphatase: 81 U/L (ref 38–126)
Anion gap: 16 — ABNORMAL HIGH (ref 5–15)
BUN: 10 mg/dL (ref 6–20)
CO2: 18 mmol/L — ABNORMAL LOW (ref 22–32)
Calcium: 9.5 mg/dL (ref 8.9–10.3)
Chloride: 98 mmol/L (ref 98–111)
Creatinine, Ser: 1.18 mg/dL (ref 0.61–1.24)
GFR, Estimated: >60 mL/min (ref >60)
Glucose, Bld: 165 mg/dL — ABNORMAL HIGH (ref 70–99)
Potassium: 3.3 mmol/L — ABNORMAL LOW (ref 3.5–5.1)
Sodium: 132 mmol/L — ABNORMAL LOW (ref 135–145)
Total Bilirubin: 1.2 mg/dL (ref 0.3–1.2)
Total Protein: 7.7 g/dL (ref 6.5–8.1)

## 2021-10-03 LAB — BASIC METABOLIC PANEL
Anion gap: 9 (ref 5–15)
BUN: 11 mg/dL (ref 6–20)
CO2: 28 mmol/L (ref 22–32)
Calcium: 9.7 mg/dL (ref 8.9–10.3)
Chloride: 97 mmol/L — ABNORMAL LOW (ref 98–111)
Creatinine, Ser: 0.88 mg/dL (ref 0.61–1.24)
GFR, Estimated: >60 mL/min (ref >60)
Glucose, Bld: 106 mg/dL — ABNORMAL HIGH (ref 70–99)
Potassium: 3.8 mmol/L (ref 3.5–5.1)
Sodium: 134 mmol/L — ABNORMAL LOW (ref 135–145)

## 2021-10-03 LAB — CBC
HCT: 43.5 % (ref 39.0–52.0)
Hemoglobin: 15.8 g/dL (ref 13.0–17.0)
MCH: 34.6 pg — ABNORMAL HIGH (ref 26.0–34.0)
MCHC: 36.3 g/dL — ABNORMAL HIGH (ref 30.0–36.0)
MCV: 95.2 fL (ref 80.0–100.0)
Platelets: 309 10*3/uL (ref 150–400)
RBC: 4.57 MIL/uL (ref 4.22–5.81)
RDW: 12.3 % (ref 11.5–15.5)
WBC: 10.4 10*3/uL (ref 4.0–10.5)
nRBC: 0 % (ref 0.0–0.2)

## 2021-10-03 LAB — URINE DRUG SCREEN, QUALITATIVE (ARMC ONLY)
Amphetamines, Ur Screen: POSITIVE — AB
Barbiturates, Ur Screen: NOT DETECTED
Benzodiazepine, Ur Scrn: NOT DETECTED
Cannabinoid 50 Ng, Ur ~~LOC~~: POSITIVE — AB
Cocaine Metabolite,Ur ~~LOC~~: POSITIVE — AB
MDMA (Ecstasy)Ur Screen: NOT DETECTED
Methadone Scn, Ur: NOT DETECTED
Opiate, Ur Screen: NOT DETECTED
Phencyclidine (PCP) Ur S: NOT DETECTED
Tricyclic, Ur Screen: NOT DETECTED

## 2021-10-03 LAB — SALICYLATE LEVEL: Salicylate Lvl: <7 mg/dL — ABNORMAL LOW (ref 7.0–30.0)

## 2021-10-03 LAB — ETHANOL: Alcohol, Ethyl (B): <10 mg/dL (ref <10)

## 2021-10-03 LAB — ACETAMINOPHEN LEVEL: Acetaminophen (Tylenol), Serum: <10 ug/mL — ABNORMAL LOW (ref 10–30)

## 2021-10-03 LAB — CK: Total CK: 121 U/L (ref 49–397)

## 2021-10-03 MED ORDER — LORAZEPAM 2 MG PO TABS
3.0000 mg | ORAL_TABLET | Freq: Once | ORAL | Status: AC
  Administered 2021-10-03: 19:00:00 3 mg via ORAL
  Filled 2021-10-03: qty 1

## 2021-10-03 NOTE — ED Notes (Signed)
Pt transported to [facility]

## 2021-10-03 NOTE — ED Notes (Signed)
Pt remains in room due to labs

## 2021-10-03 NOTE — ED Notes (Signed)
IVC/psych consult ordered/pending

## 2021-10-03 NOTE — ED Notes (Signed)
Pt dressed out with this RN and sydni NT. Belongings include:  Cowbody boots Black pants Plaid boxers Black belt [employer] Black socks Cellphone Martin Majestic hat [place] gray sweatshirt Black jacket x2 Lighter Watch black gloves Glasses Black headphone case Chapstick Psychologist, prison and probation services

## 2021-10-03 NOTE — ED Notes (Signed)
2 silver pocket knives and 1 camo pocket knife given to security

## 2021-10-03 NOTE — BH Assessment (Signed)
This writer attempted to assess patient but patient was not able to participate in assessment at this time, writer attempted to call patient's name several times but patient was unable to be aroused. Will attempt at a later time.

## 2021-10-03 NOTE — ED Provider Notes (Signed)
[facility] Emergency Department Provider Note   ____________________________________________   Event Date/Time   First MD Initiated Contact with Patient 10/03/21 1905     (approximate)  I have reviewed the triage vital signs and the nursing notes.   HISTORY  Chief Complaint Paranoia   HPI Paul Hebert is a 50 y.o. male reports that he is very convinced that his cousin has somehow set him up with [place] care told her to kill him  Reports that he was getting high with his cousin, use methamphetamine this evening and then he began running and knew that the [place] was after him.  Reports this is actually happened to him before about a month ago when he used meth  He advises that this happens when he uses drugs like meth, but he knows that it is real and that the [place] is definitely trying to kill him.   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   Goals of care, counseling/discussion 10/18/2020    Past Surgical History:  Procedure Laterality Date   PORT-A-CATH REMOVAL N/A 02/04/2021   Procedure: REMOVAL PORT-A-CATH;  Surgeon: Olean Ree, MD;  Location: [facility] ORS;  Service: General;  Laterality: N/A;   PORTA CATH INSERTION N/A 10/25/2020   Procedure: PORTA CATH INSERTION;  Surgeon: Algernon Huxley, MD;  Location: [facility];  Service: Cardiovascular;  Laterality: N/A;   REMOVAL OF GASTROSTOMY TUBE N/A 02/04/2021   Procedure: REMOVAL OF GASTROSTOMY TUBE;  Surgeon: Olean Ree, MD;  Location: [facility] ORS;  Service: General;  Laterality: N/A;    Prior to Admission medications   Not on File     Allergies Patient has no known allergies.  Family History  Problem Relation Age of Onset   Cancer Father     Social History Social History   Tobacco Use   Smoking status: Every Day    Packs/day: 1.00    Types: Cigarettes   Smokeless tobacco: Current   Tobacco comments:    Patches very rarely  Vaping Use   Vaping Use: Never used  Substance Use Topics   Alcohol use: Yes    Comment: 12  week   Drug use: Yes    Types: Marijuana, "Crack" cocaine, Methamphetamines    Comment: 2 months last time usine    Review of Systems Constitutional: No fever/chills or recent illness Eyes: No visual changes. Cardiovascular: Denies chest pain. Respiratory: Denies shortness of breath. Gastrointestinal: No abdominal pain.   Genitourinary: Negative for dysuria. Musculoskeletal: No muscle aches or pains. Skin: Negative for rash. Neurological: Negative for headaches Psychiatric: Denies suicidal or homicidal ideation but reports that he is quite sure the [place] is trying to kill him tonight.    ____________________________________________   PHYSICAL EXAM:  VITAL SIGNS: ED Triage Vitals  Enc Vitals Group     BP 10/03/21 1802 (!) 166/120     Pulse Rate 10/03/21 1802 (!) 130     Resp 10/03/21 1802 (!) 24     Temp 10/03/21 1802 98 F (36.7 C)     Temp src --      SpO2 10/03/21 1802 93 %  Weight 10/03/21 1759 150 lb (68 kg)     Height 10/03/21 1759 5\' 9"  (1.753 m)     Head Circumference --      Peak Flow --      Pain Score 10/03/21 1758 0     Pain Loc --      Pain Edu? --      Excl. in [place]? --     Constitutional: Alert and oriented.  Somewhat anxious, appears hyperactive.  Reports has been using methamphetamine tonight.  Is very fixated on [place] cartel trying to come after him but feels safe here Eyes: Conjunctivae are normal. Head: Atraumatic. Nose: No congestion/rhinnorhea. Mouth/Throat: Mucous membranes are moist. Neck: No stridor.  Cardiovascular:  Tachycardic rate, regular rhythm. Grossly normal heart sounds.  Good peripheral circulation. Respiratory: Normal respiratory effort.  No retractions. Lungs CTAB. Gastrointestinal: Soft and nontender. No distention. Musculoskeletal: No lower extremity tenderness nor edema. Neurologic:  Normal speech and language. No gross focal neurologic deficits are appreciated.  Skin:  Skin is warm, dry and intact. No rash noted. Psychiatric: Mood and affect are normal. Speech and behavior are normal.  ____________________________________________   LABS (all labs ordered are listed, but only abnormal results are displayed)  Labs Reviewed  COMPREHENSIVE METABOLIC PANEL - Abnormal; Notable for the following components:      Result Value   Sodium 132 (*)    Potassium 3.3 (*)    CO2 18 (*)    Glucose, Bld 165 (*)    Anion gap 16 (*)    All other components within normal limits  SALICYLATE LEVEL - Abnormal; Notable for the following components:   Salicylate Lvl <9.7 (*)    All other components within normal limits  ACETAMINOPHEN LEVEL - Abnormal; Notable for the following components:   Acetaminophen (Tylenol), Serum <10 (*)    All other components within normal limits  CBC - Abnormal; Notable for the following components:   MCH 34.6 (*)    MCHC 36.3 (*)    All other components within normal limits  URINE DRUG SCREEN, QUALITATIVE ([facility] ONLY) - Abnormal; Notable for the following components:   Amphetamines, Ur Screen POSITIVE (*)    Cocaine Metabolite,Ur [place] POSITIVE (*)    Cannabinoid 50 Ng, Ur [place] POSITIVE (*)    All other components within normal limits  BASIC METABOLIC PANEL - Abnormal; Notable for the following components:   Sodium 134 (*)    Chloride 97 (*)    Glucose, Bld 106 (*)    All other components within normal limits  ETHANOL  CK  LACTIC ACID, PLASMA   ____________________________________________  EKG  Is reviewed inter by me at 1810 Heart rate 125 QRs 99 QTc 440 Sinus  tachycardia probable LVH ____________________________________________  RADIOLOGY   ____________________________________________   PROCEDURES  Procedure(s) performed: None  Procedures  Critical Care performed: No  ____________________________________________   INITIAL IMPRESSION / ASSESSMENT AND PLAN / ED COURSE  Pertinent labs & imaging results that were available during my care of the patient were reviewed by me and considered in my medical decision making (see chart for details).  \Patient with a history of substance-induced paranoia, he reports history of methamphetamine use tonight and then quickly thereafter became fixated on being at chased after by [place].  I suspect this is substance-induced he is freely admits to use of methamphetamine just prior to coming in  He is tachycardic hypertensive and very anxious.  Will give Ativan for calming, labs reviewed and demonstrate an elevated  anion gap.  Salicylate level is negative, he is not diabetic, suspect likely secondary to exertional and   Clinical Course as of 10/03/21 2348  Mon Oct 03, 2021  1938 Pulse Rate(!): 134 [MQ]  1938 BP(!): 166/123 Suspect likely secondary to sympathomimetic use.  Denies acute medical illness, but certainly reports recent use of amphetamine [MQ]  1938 Will provide anxiolysis columning with oral agent Ativan which patient is agreeable to.  Placed under IVC with psych consult requested [MQ]    Clinical Course User Index [MQ] Delman Kitten, MD   Repeat metabolic panel, lactic acid, and CK pending sign for follow-up and further medical screening prior to psychiatric clearance assigned to my oncoming partner Dr. Cheri Fowler  ____________________________________________   FINAL CLINICAL IMPRESSION(S) / ED DIAGNOSES  Final diagnoses:  Drug-induced paranoid state [facility])  Involuntary commitment        Note:  This document was prepared using Dragon voice recognition software and may include  unintentional dictation errors       Delman Kitten, MD 10/03/21 2348

## 2021-10-03 NOTE — ED Provider Notes (Signed)
The patient has been placed in psychiatric observation due to the need to provide a safe environment for the patient while obtaining psychiatric consultation and evaluation, as well as ongoing medical and medication management to treat the patient's condition.  The patient has been placed under full IVC at this time.    Delman Kitten, MD 10/03/21 2358

## 2021-10-03 NOTE — ED Notes (Signed)
Pt given a lunch tray and a coke

## 2021-10-03 NOTE — ED Triage Notes (Signed)
Pt to ED states "the [place] is trying to kill me, they're everywhere, shut the door and call the police. That security guard is one" Pt appears paranoids, hyperventilating.  Reports meth use today   Pt states "I have multiple knifes" and starts taking multiple pocket knives out of pants

## 2021-10-04 DIAGNOSIS — F19959 Other psychoactive substance use, unspecified with psychoactive substance-induced psychotic disorder, unspecified: Secondary | ICD-10-CM | POA: Diagnosis present

## 2021-10-04 LAB — CBC WITH DIFFERENTIAL/PLATELET
Abs Immature Granulocytes: 0.01 10*3/uL (ref 0.00–0.07)
Basophils Absolute: 0 10*3/uL (ref 0.0–0.1)
Basophils Relative: 0 %
Eosinophils Absolute: 0.2 10*3/uL (ref 0.0–0.5)
Eosinophils Relative: 3 %
HCT: 44 % (ref 39.0–52.0)
Hemoglobin: 15.5 g/dL (ref 13.0–17.0)
Immature Granulocytes: 0 %
Lymphocytes Relative: 17 %
Lymphs Abs: 1.2 10*3/uL (ref 0.7–4.0)
MCH: 33.5 pg (ref 26.0–34.0)
MCHC: 35.2 g/dL (ref 30.0–36.0)
MCV: 95.2 fL (ref 80.0–100.0)
Monocytes Absolute: 0.5 10*3/uL (ref 0.1–1.0)
Monocytes Relative: 7 %
Neutro Abs: 5.2 10*3/uL (ref 1.7–7.7)
Neutrophils Relative %: 73 %
Platelets: 282 10*3/uL (ref 150–400)
RBC: 4.62 MIL/uL (ref 4.22–5.81)
RDW: 12.5 % (ref 11.5–15.5)
WBC: 7 10*3/uL (ref 4.0–10.5)
nRBC: 0 % (ref 0.0–0.2)

## 2021-10-04 LAB — RESP PANEL BY RT-PCR (FLU A&B, COVID) ARPGX2
Influenza A by PCR: NEGATIVE
Influenza A by PCR: NEGATIVE
Influenza B by PCR: NEGATIVE
Influenza B by PCR: NEGATIVE
SARS Coronavirus 2 by RT PCR: NEGATIVE
SARS Coronavirus 2 by RT PCR: NEGATIVE

## 2021-10-04 LAB — GROUP A STREP BY PCR: Group A Strep by PCR: NOT DETECTED

## 2021-10-04 MED ORDER — HALOPERIDOL 0.5 MG PO TABS
1.0000 mg | ORAL_TABLET | Freq: Two times a day (BID) | ORAL | Status: DC
  Administered 2021-10-04: 11:00:00 1 mg via ORAL
  Filled 2021-10-04: qty 2

## 2021-10-04 MED ORDER — GABAPENTIN 300 MG PO CAPS
300.0000 mg | ORAL_CAPSULE | Freq: Three times a day (TID) | ORAL | Status: DC
  Administered 2021-10-04: 11:00:00 300 mg via ORAL
  Filled 2021-10-04: qty 1

## 2021-10-04 NOTE — ED Notes (Signed)
RN told by staff pt insists he is leaving with his fiancee when she leaves after her visit.   EDP and charge nurse made aware.    Pt relations made aware EDP wishes to speak with fiancee before she visits patient.

## 2021-10-04 NOTE — ED Notes (Signed)
Pt dressing for discharge.  

## 2021-10-04 NOTE — Consult Note (Signed)
[facility] Face-to-Face Psychiatry Consult   Reason for Consult:  paranoia Referring Physician:  EDP Patient Identification: Paul Hebert MRN:  [id number] Principal Diagnosis: Psychoactive substance-induced psychosis ([place]) Diagnosis:  Principal Problem:   Psychoactive substance-induced psychosis ([place])   Total Time spent with patient: 45 minutes  Subjective:   Paul Hebert is a 50 y.o. male patient admitted with "I am not paranoid from drugs, my cousins followed me because they think I am with the police."  HPI:  Patient seen and chart reviewed.  Patient states that he is paranoid, but for reasons.  He does believe that the cartel are after him.  He states that he was at his cousin's house where he missed cocaine.  And he "wondered what the hell he stumbled into."  Patient does not convey sensible account of what happened.  Patient states that he took the bus here to the hospital and "they" looked at bus footage and were following him.  Patient denies auditory or visual hallucinations.  He continues to say he is not paranoid, that these are real.  He admits to drug use but states that "this has nothing to do with that."  Patient states that he and his fiance had an argument about 4 days ago and that is when he went over to his cousin's house.  He states that he "does not do drugs every day." Patient denies suicidal or homicidal ideations. He is complaining of sore throat, EDP is running some tests, COVID, Influenza, strep. All negative.  Patient did use methamphetamine yesterday, and UDS is also positive for cocaine and THC.   1500: Patient is requesting discharge at this time. He is coherent, still believes that perhaps his cousins "set him up." But is calm and cooperative. He is requesting referral to outpatient substance use rehab. Denies any thoughts of self harm/suicide. Denies homicidal ideations. Is not threatening to look for anyone that he thinks is out to "get" him. IVC rescinded.  Patient  is currently sleeping. He does not meet criteria for inpatient psychiatric hospitalization.   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[facility])     Past Surgical History:  Procedure Laterality Date   PORT-A-CATH REMOVAL N/A 02/04/2021   Procedure: REMOVAL PORT-A-CATH;  Surgeon: Olean Ree, MD;  Location: [facility] ORS;  Service: General;  Laterality: N/A;   PORTA CATH INSERTION N/A 10/25/2020   Procedure: PORTA CATH INSERTION;  Surgeon: Algernon Huxley, MD;  Location: [place] [facility];  Service: Cardiovascular;  Laterality: N/A;   REMOVAL OF GASTROSTOMY TUBE N/A 02/04/2021   Procedure: REMOVAL OF GASTROSTOMY TUBE;  Surgeon: Olean Ree, MD;  Location: [facility] ORS;  Service: General;  Laterality: N/A;   Family History:  Family History  Problem Relation Age of Onset   Cancer Father    Family Psychiatric  History: unknown Social History:  Social History   Substance and Sexual Activity  Alcohol Use Yes   Comment: 12  week     Social History   Substance and Sexual Activity  Drug Use Yes   Types: Marijuana, "Crack" cocaine, Methamphetamines   Comment: 2 months last time usine    Social History   Socioeconomic History   Marital status: Single    Spouse name: Not on file   Number of  children: Not on file   Years of education: Not on file   Highest education level: Not on file  Occupational History   Not on file  Tobacco Use   Smoking status: Every Day    Packs/day: 1.00    Types: Cigarettes   Smokeless tobacco: Current   Tobacco comments:    Patches very rarely  Vaping Use   Vaping Use: Never used  Substance and Sexual Activity   Alcohol use: Yes    Comment: 12  week   Drug use: Yes    Types: Marijuana, "Crack" cocaine, Methamphetamines    Comment: 2 months last  time usine   Sexual activity: Not Currently  Other Topics Concern   Not on file  Social History Narrative   Not on file   Social Determinants of Health   Financial Resource Strain: Not on file  Food Insecurity: Not on file  Transportation Needs: Not on file  Physical Activity: Not on file  Stress: Not on file  Social Connections: Not on file   Additional Social History:    Allergies:  No Known Allergies  Labs:  Results for orders placed or performed during the hospital encounter of 10/03/21 (from the past 48 hour(s))  Comprehensive metabolic panel     Status: Abnormal   Collection Time: 10/03/21  5:59 PM  Result Value Ref Range   Sodium 132 (L) 135 - 145 mmol/L   Potassium 3.3 (L) 3.5 - 5.1 mmol/L   Chloride 98 98 - 111 mmol/L   CO2 18 (L) 22 - 32 mmol/L   Glucose, Bld 165 (H) 70 - 99 mg/dL    Comment: Glucose reference range applies only to samples taken after fasting for at least 8 hours.   BUN 10 6 - 20 mg/dL   Creatinine, Ser 1.18 0.61 - 1.24 mg/dL   Calcium 9.5 8.9 - 10.3 mg/dL   Total Protein 7.7 6.5 - 8.1 g/dL   Albumin 4.6 3.5 - 5.0 g/dL   AST 38 15 - 41 U/L   ALT 21 0 - 44 U/L   Alkaline Phosphatase 81 38 - 126 U/L   Total Bilirubin 1.2 0.3 - 1.2 mg/dL   GFR, Estimated >60 >60 mL/min    Comment: (NOTE) Calculated using the CKD-EPI Creatinine Equation (2021)    Anion gap 16 (H) 5 - 15    Comment: Performed at [facility], [address] [place], [place] [address]  Ethanol     Status: None   Collection Time: 10/03/21  5:59 PM  Result Value Ref Range   Alcohol, Ethyl (B) <10 <10 mg/dL    Comment: (NOTE) Lowest detectable limit for serum alcohol is 10 mg/dL.  For medical purposes only. Performed at [facility], [address] [place], [place] [address]   Salicylate level     Status: Abnormal   Collection Time: 10/03/21  5:59 PM  Result Value Ref Range   Salicylate Lvl <3.4 (L) 7.0 - 30.0 mg/dL    Comment: Performed at [facility], [address] [place], [place] [address]  Acetaminophen level     Status: Abnormal   Collection Time: 10/03/21  5:59 PM  Result Value Ref Range   Acetaminophen (Tylenol), Serum <10 (L) 10 - 30 ug/mL    Comment: (NOTE) Therapeutic concentrations vary significantly. A range of 10-30 ug/mL  may be an effective concentration for many patients. However, some  are best treated at concentrations outside of this range. Acetaminophen concentrations >150  ug/mL at 4 hours after ingestion  and >50 ug/mL at 12 hours after ingestion are often associated with  toxic reactions.  Performed at [facility], [address] [place], [place] [address]   cbc     Status: Abnormal   Collection Time: 10/03/21  5:59 PM  Result Value Ref Range   WBC 10.4 4.0 - 10.5 K/uL   RBC 4.57 4.22 - 5.81 MIL/uL   Hemoglobin 15.8 13.0 - 17.0 g/dL   HCT 43.5 39.0 - 52.0 %   MCV 95.2 80.0 - 100.0 fL   MCH 34.6 (H) 26.0 - 34.0 pg   MCHC 36.3 (H) 30.0 - 36.0 g/dL   RDW 12.3 11.5 - 15.5 %   Platelets 309 150 - 400 K/uL   nRBC 0.0 0.0 - 0.2 %    Comment: Performed at [facility], [address], [place], [place] [address]  Urine Drug Screen, Qualitative     Status: Abnormal   Collection Time: 10/03/21  5:59 PM  Result Value Ref Range   Tricyclic, Ur Screen NONE DETECTED NONE DETECTED   Amphetamines, Ur Screen POSITIVE (A) NONE DETECTED   MDMA (Ecstasy)Ur Screen NONE DETECTED NONE DETECTED   Cocaine Metabolite,Ur [place] POSITIVE (A) NONE DETECTED   Opiate, Ur Screen NONE DETECTED NONE DETECTED   Phencyclidine (PCP) Ur S NONE DETECTED NONE DETECTED   Cannabinoid 50 Ng, Ur [place] POSITIVE (A) NONE DETECTED   Barbiturates, Ur Screen NONE DETECTED NONE DETECTED   Benzodiazepine, Ur Scrn NONE DETECTED NONE DETECTED   Methadone Scn, Ur NONE DETECTED NONE DETECTED    Comment: (NOTE) Tricyclics + metabolites, urine    Cutoff 1000 ng/mL Amphetamines + metabolites, urine  Cutoff 1000 ng/mL MDMA  (Ecstasy), urine              Cutoff 500 ng/mL Cocaine Metabolite, urine          Cutoff 300 ng/mL Opiate + metabolites, urine        Cutoff 300 ng/mL Phencyclidine (PCP), urine         Cutoff 25 ng/mL Cannabinoid, urine                 Cutoff 50 ng/mL Barbiturates + metabolites, urine  Cutoff 200 ng/mL Benzodiazepine, urine              Cutoff 200 ng/mL Methadone, urine                   Cutoff 300 ng/mL  The urine drug screen provides only a preliminary, unconfirmed analytical test result and should not be used for non-medical purposes. Clinical consideration and professional judgment should be applied to any positive drug screen result due to possible interfering substances. A more specific alternate chemical method must be used in order to obtain a confirmed analytical result. Gas chromatography / mass spectrometry (GC/MS) is the preferred confirm atory method. Performed at [facility], [address] [place], [place] [address]   CK     Status: None   Collection Time: 10/03/21 10:51 PM  Result Value Ref Range   Total CK 121 49 - 397 U/L    Comment: Performed at [facility], [address] [place], [place] [address]  Basic metabolic panel     Status: Abnormal   Collection Time: 10/03/21 10:51 PM  Result Value Ref Range   Sodium 134 (L) 135 - 145 mmol/L   Potassium 3.8 3.5 - 5.1 mmol/L   Chloride 97 (L) 98 -  111 mmol/L   CO2 28 22 - 32 mmol/L   Glucose, Bld 106 (H) 70 - 99 mg/dL    Comment: Glucose reference range applies only to samples taken after fasting for at least 8 hours.   BUN 11 6 - 20 mg/dL   Creatinine, Ser 0.88 0.61 - 1.24 mg/dL   Calcium 9.7 8.9 - 10.3 mg/dL   GFR, Estimated >60 >60 mL/min    Comment: (NOTE) Calculated using the CKD-EPI Creatinine Equation (2021)    Anion gap 9 5 - 15    Comment: Performed at [facility], [address] [place], [place] [address]  Lactic acid, plasma     Status: None   Collection Time:  10/03/21 10:53 PM  Result Value Ref Range   Lactic Acid, Venous 1.4 0.5 - 1.9 mmol/L    Comment: Performed at [facility], [address] [place], [place] [address]  Resp Panel by RT-PCR (Flu A&B, Covid) Nasopharyngeal Swab     Status: None   Collection Time: 10/04/21 12:04 AM   Specimen: Nasopharyngeal Swab; Nasopharyngeal(NP) swabs in vial transport medium  Result Value Ref Range   SARS Coronavirus 2 by RT PCR NEGATIVE NEGATIVE    Comment: (NOTE) SARS-CoV-2 target nucleic acids are NOT DETECTED.  The SARS-CoV-2 RNA is generally detectable in upper respiratory specimens during the acute phase of infection. The lowest concentration of SARS-CoV-2 viral copies this assay can detect is 138 copies/mL. A negative result does not preclude SARS-Cov-2 infection and should not be used as the sole basis for treatment or other patient management decisions. A negative result may occur with  improper specimen collection/handling, submission of specimen other than nasopharyngeal swab, presence of viral mutation(s) within the areas targeted by this assay, and inadequate number of viral copies(<138 copies/mL). A negative result must be combined with clinical observations, patient history, and epidemiological information. The expected result is Negative.  Fact Sheet for Patients:  EntrepreneurPulse.com.au  Fact Sheet for Healthcare Providers:  IncredibleEmployment.be  This test is no t yet approved or cleared by the [place] FDA and  has been authorized for detection and/or diagnosis of SARS-CoV-2 by FDA under an Emergency Use Authorization (EUA). This EUA will remain  in effect (meaning this test can be used) for the duration of the COVID-19 declaration under Section 564(b)(1) of the Act, 21 U.S.C.section 360bbb-3(b)(1), unless the authorization is terminated  or revoked sooner.       Influenza A by PCR NEGATIVE NEGATIVE   Influenza B by  PCR NEGATIVE NEGATIVE    Comment: (NOTE) The Xpert Xpress SARS-CoV-2/FLU/RSV plus assay is intended as an aid in the diagnosis of influenza from Nasopharyngeal swab specimens and should not be used as a sole basis for treatment. Nasal washings and aspirates are unacceptable for Xpert Xpress SARS-CoV-2/FLU/RSV testing.  Fact Sheet for Patients: EntrepreneurPulse.com.au  Fact Sheet for Healthcare Providers: IncredibleEmployment.be  This test is not yet approved or cleared by the [place] FDA and has been authorized for detection and/or diagnosis of SARS-CoV-2 by FDA under an Emergency Use Authorization (EUA). This EUA will remain in effect (meaning this test can be used) for the duration of the COVID-19 declaration under Section 564(b)(1) of the Act, 21 U.S.C. section 360bbb-3(b)(1), unless the authorization is terminated or revoked.  Performed at [facility], [address], [place] [address]   Group A Strep by PCR [facility] Only)     Status: None   Collection Time: 10/04/21 11:37 AM  Specimen: Throat; Sterile Swab  Result Value Ref Range   Group A Strep by PCR NOT DETECTED NOT DETECTED    Comment: Performed at [facility], [address] [place], [place] [address]  Resp Panel by RT-PCR (Flu A&B, Covid) Throat     Status: None   Collection Time: 10/04/21 11:37 AM   Specimen: Throat; Nasopharyngeal(NP) swabs in vial transport medium  Result Value Ref Range   SARS Coronavirus 2 by RT PCR NEGATIVE NEGATIVE    Comment: (NOTE) SARS-CoV-2 target nucleic acids are NOT DETECTED.  The SARS-CoV-2 RNA is generally detectable in upper respiratory specimens during the acute phase of infection. The lowest concentration of SARS-CoV-2 viral copies this assay can detect is 138 copies/mL. A negative result does not preclude SARS-Cov-2 infection and should not be used as the sole basis for treatment or other patient  management decisions. A negative result may occur with  improper specimen collection/handling, submission of specimen other than nasopharyngeal swab, presence of viral mutation(s) within the areas targeted by this assay, and inadequate number of viral copies(<138 copies/mL). A negative result must be combined with clinical observations, patient history, and epidemiological information. The expected result is Negative.  Fact Sheet for Patients:  EntrepreneurPulse.com.au  Fact Sheet for Healthcare Providers:  IncredibleEmployment.be  This test is no t yet approved or cleared by the [place] FDA and  has been authorized for detection and/or diagnosis of SARS-CoV-2 by FDA under an Emergency Use Authorization (EUA). This EUA will remain  in effect (meaning this test can be used) for the duration of the COVID-19 declaration under Section 564(b)(1) of the Act, 21 U.S.C.section 360bbb-3(b)(1), unless the authorization is terminated  or revoked sooner.       Influenza A by PCR NEGATIVE NEGATIVE   Influenza B by PCR NEGATIVE NEGATIVE    Comment: (NOTE) The Xpert Xpress SARS-CoV-2/FLU/RSV plus assay is intended as an aid in the diagnosis of influenza from Nasopharyngeal swab specimens and should not be used as a sole basis for treatment. Nasal washings and aspirates are unacceptable for Xpert Xpress SARS-CoV-2/FLU/RSV testing.  Fact Sheet for Patients: EntrepreneurPulse.com.au  Fact Sheet for Healthcare Providers: IncredibleEmployment.be  This test is not yet approved or cleared by the [place] FDA and has been authorized for detection and/or diagnosis of SARS-CoV-2 by FDA under an Emergency Use Authorization (EUA). This EUA will remain in effect (meaning this test can be used) for the duration of the COVID-19 declaration under Section 564(b)(1) of the Act, 21 U.S.C. section 360bbb-3(b)(1), unless the  authorization is terminated or revoked.  Performed at [facility], [address] [place], [place] [address]   CBC with Differential     Status: None   Collection Time: 10/04/21 11:37 AM  Result Value Ref Range   WBC 7.0 4.0 - 10.5 K/uL   RBC 4.62 4.22 - 5.81 MIL/uL   Hemoglobin 15.5 13.0 - 17.0 g/dL   HCT 44.0 39.0 - 52.0 %   MCV 95.2 80.0 - 100.0 fL   MCH 33.5 26.0 - 34.0 pg   MCHC 35.2 30.0 - 36.0 g/dL   RDW 12.5 11.5 - 15.5 %   Platelets 282 150 - 400 K/uL   nRBC 0.0 0.0 - 0.2 %   Neutrophils Relative % 73 %   Neutro Abs 5.2 1.7 - 7.7 K/uL   Lymphocytes Relative 17 %   Lymphs Abs 1.2 0.7 - 4.0 K/uL   Monocytes Relative 7 %   Monocytes Absolute 0.5 0.1 -  1.0 K/uL   Eosinophils Relative 3 %   Eosinophils Absolute 0.2 0.0 - 0.5 K/uL   Basophils Relative 0 %   Basophils Absolute 0.0 0.0 - 0.1 K/uL   Immature Granulocytes 0 %   Abs Immature Granulocytes 0.01 0.00 - 0.07 K/uL    Comment: Performed at [facility], [address], [place], [place] [address]    Current Facility-Administered Medications  Medication Dose Route Frequency Provider Last Rate Last Admin   gabapentin (NEURONTIN) capsule 300 mg  300 mg Oral TID Patrecia Pour, NP   300 mg at 10/04/21 1053   haloperidol (HALDOL) tablet 1 mg  1 mg Oral BID Patrecia Pour, NP   1 mg at 10/04/21 1053   No current outpatient medications on file.    Musculoskeletal: Strength & Muscle Tone: within normal limits Gait & Station: normal Patient leans: N/A  Psychiatric Specialty Exam:  Presentation  General Appearance: Appropriate for Environment  Eye Contact:Good  Speech:Clear and Coherent  Speech Volume:Normal  Handedness:Right   Mood and Affect  Mood:Euthymic  Affect:Appropriate   Thought Process  Thought Processes:Coherent  Descriptions of Associations:Loose  Orientation:Full (Time, Place and Person)  Thought Content:Delusions  History of Schizophrenia/Schizoaffective  disorder:No  Duration of Psychotic Symptoms:Less than six months  Hallucinations:Hallucinations: None  Ideas of Reference:Delusions  Suicidal Thoughts:Suicidal Thoughts: No  Homicidal Thoughts:Homicidal Thoughts: No   Sensorium  Memory:Immediate Fair  Judgment:Poor  Insight:Poor   Executive Functions  Concentration:Fair  Attention Span:Fair  [place]   Psychomotor Activity  Psychomotor Activity:Psychomotor Activity: Normal   Assets  Assets:Physical Health; Resilience   Sleep  Sleep:Sleep: Good   Physical Exam: Physical Exam Vitals and nursing note reviewed.  HENT:     Head: Normocephalic.     Nose: No congestion or rhinorrhea.  Eyes:     General:        Right eye: No discharge.        Left eye: No discharge.  Pulmonary:     Effort: Pulmonary effort is normal.  Musculoskeletal:        General: Normal range of motion.     Cervical back: Normal range of motion.  Neurological:     Mental Status: He is alert and oriented to person, place, and time.  Psychiatric:        Behavior: Behavior normal.   Review of Systems  HENT:  Positive for sore throat.   Psychiatric/Behavioral:  Positive for substance abuse.   Blood pressure (!) 131/104, pulse (!) 119, temperature 98.3 F (36.8 C), temperature source Oral, resp. rate 18, height 5\' 9"  (1.753 m), weight 68 kg, SpO2 99 %. Body mass index is 22.15 kg/m.  Treatment Plan Summary: Plan patient does not require or desire inpatient psychiatric treatment. Referred to outpatient resources for substance abuse treatment . Reviewed with EDP  Disposition: No evidence of imminent risk to self or others at present.   Discussed crisis plan, support from social network, calling 911, coming to the Emergency Department, and calling Suicide Hotline.  Sherlon Handing, NP 10/04/2021 3:23 PM

## 2021-10-04 NOTE — ED Notes (Signed)
Meal tray given 

## 2021-10-04 NOTE — ED Notes (Addendum)
Pt c/o mouth and tongue pain - hx of oropharynx cancer.  Swelling noted under the tongue.  EDP made aware.

## 2021-10-04 NOTE — ED Notes (Signed)
Pt refused DC VS.

## 2021-10-04 NOTE — ED Notes (Signed)
Roselyn Reef NP at bedside.

## 2021-10-04 NOTE — ED Notes (Signed)
IV removed.

## 2021-10-04 NOTE — ED Notes (Signed)
Pt asking this Probation officer who he speaks to about leaving with his fiancee.  RN explained he is under IVC and cannot leave until papers are rescinded.  Pt asking to speak with psychiatrist.

## 2021-10-04 NOTE — ED Notes (Signed)
Report called to dorian rn bhu nurse

## 2021-10-04 NOTE — BH Assessment (Signed)
Comprehensive Clinical Assessment (CCA) Screening, Triage and Referral Note  10/04/2021 Paul Hebert [id number]  Paul Hebert, 50 year old male who presents to [facility] ED involuntarily for treatment. Per triage note, Pt to ED states "the [employer] is trying to kill me, they're everywhere, shut the door and call the police. That security guard is one". Pt appears paranoids, hyperventilating. Reports meth use today. Pt states "I have multiple knives" and starts taking multiple pocketknives out of pants.    During TTS assessment pt presents alert and oriented x 4, anxious but cooperative, and mood-congruent with affect. The pt does not appear to be responding to internal or external stimuli. Neither is the pt presenting with any delusional thinking. Pt verified the information provided to triage RN.   Pt identifies his main complaint to be that the [employer] are after him because they think he is working with the police in a drug raid. Patient reports he was at his cousins house because he got into an argument with his fiance, and he is homeless. Patient admits to drug use at that time; however, now he states he is sober and in his right mind. The drugs have nothing to do with what happened. Patient denies having auditory and visual hallucinations. Patient reports he is not paranoid and the events leading up to him coming to the ED took place. Patient reports he was being followed and caught the bus to the hospital for safety reasons. Pt reports his tongue is swollen and wants the doctor to look at it. Patient is also complaining of sore throat. Pt denies SI/HI. Pt contracts for safety.    Per Louise,NP, pt does not meet criteria for inpatient psychiatric admission.    Chief Complaint:  Chief Complaint  Patient presents with   Paranoid    Patient reports [place] [employer] thinks he is a Animator" and working with the police.    Visit Diagnosis: Psychoactive substance-induced  psychosis  Patient Reported Information How did you hear about [place]? Self  What Is the Reason for Your Visit/Call Today? Patient reports "family members" think he is working with the police in a drug raid.  How Long Has This Been Causing You Problems? <Week  What Do You Feel Would Help You the Most Today? -- (Assessment only)   Have You Recently Had Any Thoughts About Hurting Yourself? No  Are You Planning to Commit Suicide/Harm Yourself At This time? No   Have you Recently Had Thoughts About [place]? No  Are You Planning to Harm Someone at This Time? No  Explanation: No data recorded  Have You Used Any Alcohol or Drugs in the Past 24 Hours? Yes  How Long Ago Did You Use Drugs or Alcohol? No data recorded What Did You Use and How Much? Methamphetamines and marijuana   Do You Currently Have a Therapist/Psychiatrist? No  Name of Therapist/Psychiatrist: No data recorded  Have You Been Recently Discharged From Any Office Practice or Programs? No  Explanation of Discharge From Practice/Program: No data recorded   CCA Screening Triage Referral Assessment Type of Contact: Face-to-Face  Telemedicine Service Delivery:   Is this Initial or Reassessment? No data recorded Date Telepsych consult ordered in [facility]:  No data recorded Time Telepsych consult ordered in [facility]:  No data recorded Location of Assessment: [facility] ED  Provider Location: [facility] ED   Collateral Involvement: n/a   Does Patient Have a Alliance? No data recorded Name and Contact of Legal Guardian:  No data recorded If Minor and Not Living with Parent(s), Who has Custody? n/a  Is CPS involved or ever been involved? Never  Is APS involved or ever been involved? Never   Patient Determined To Be At Risk for Harm To Self or Others Based on Review of Patient Reported Information or Presenting Complaint? No  Method: No data recorded Availability of Means: No data recorded Intent:  No data recorded Notification Required: No data recorded Additional Information for Danger to Others Potential: No data recorded Additional Comments for Danger to Others Potential: No data recorded Are There Guns or Other Weapons in Your Home? No data recorded Types of Guns/Weapons: No data recorded Are These Weapons Safely Secured?                            No data recorded Who Could Verify You Are Able To Have These Secured: No data recorded Do You Have any Outstanding Charges, Pending Court Dates, Parole/Probation? No data recorded Contacted To Inform of Risk of Harm To Self or Others: Other: Comment   Does Patient Present under Involuntary Commitment? Yes  IVC Papers Initial File Date: 10/04/21   [place] of Residence: [place]   Patient Currently Receiving the Following Services: Not Receiving Services   Determination of Need: Urgent (48 hours)   Options For Referral: Chemical Dependency Intensive Outpatient Therapy (CDIOP); Outpatient Therapy; Therapeutic Triage Services   Discharge Disposition:     Eula Fried, Counselor,LCAS-A

## 2021-10-04 NOTE — ED Provider Notes (Signed)
Pt seen by psych and IVC rescinded. Cleared for d/c. Discharged in stable condition. Denies any acute concerns to this examiner.    Lucrezia Starch, MD 10/04/21 [phone]

## 2021-10-04 NOTE — ED Provider Notes (Signed)
Patient complaining of sore throat starting last night.  We will get another COVID test on him just in case and a strep test.  I do not see any exudate but it slightly red at the back of his throat.  He additionally has a small mobile node on the left side of his neck.  I do not really see any swelling under his tongue and there is no swelling on palpation under his mandible.  Psych does not think he can be committed because he is not homicidal or suicidal.  They did report that he still thinks that the cartel is after him.  I did not ask him about that currently.  Patient's fianc is coming to see him later.  Either psych or I will talk to him about this and make sure that he is not going to do anything about the cartel coming after him.   Nena Polio, MD 10/04/21 [phone]

## 2021-10-04 NOTE — ED Notes (Signed)
Pt discharged to the lobby.  All belongings (phone, wallet and knives from security safe included) returned to patient.

## 2021-10-04 NOTE — ED Notes (Signed)
Pt awake and allowed to use the phone.

## 2021-10-04 NOTE — ED Notes (Signed)
EDP on unit to assess patient.  Orders placed.

## 2021-10-04 NOTE — ED Notes (Signed)
Pt has IV.  Will be taken out by NT.

## 2021-10-05 ENCOUNTER — Ambulatory Visit (HOSPITAL_COMMUNITY)
Admission: EM | Admit: 2021-10-05 | Discharge: 2021-10-07 | Disposition: A | Discharge status: Home / Self Care | Payer: Medicaid Other | Attending: Nurse Practitioner | Admitting: Nurse Practitioner

## 2021-10-05 DIAGNOSIS — F1410 Cocaine abuse, uncomplicated: Secondary | ICD-10-CM

## 2021-10-05 DIAGNOSIS — Z20822 Contact with and (suspected) exposure to covid-19: Secondary | ICD-10-CM | POA: Insufficient documentation

## 2021-10-05 DIAGNOSIS — F17210 Nicotine dependence, cigarettes, uncomplicated: Secondary | ICD-10-CM | POA: Insufficient documentation

## 2021-10-05 DIAGNOSIS — F1994 Other psychoactive substance use, unspecified with psychoactive substance-induced mood disorder: Secondary | ICD-10-CM | POA: Diagnosis not present

## 2021-10-05 DIAGNOSIS — Z5900 Homelessness unspecified: Secondary | ICD-10-CM | POA: Insufficient documentation

## 2021-10-05 DIAGNOSIS — F1510 Other stimulant abuse, uncomplicated: Secondary | ICD-10-CM | POA: Diagnosis not present

## 2021-10-05 DIAGNOSIS — F19959 Other psychoactive substance use, unspecified with psychoactive substance-induced psychotic disorder, unspecified: Secondary | ICD-10-CM

## 2021-10-05 DIAGNOSIS — Z79899 Other long term (current) drug therapy: Secondary | ICD-10-CM | POA: Insufficient documentation

## 2021-10-05 DIAGNOSIS — Z85818 Personal history of malignant neoplasm of other sites of lip, oral cavity, and pharynx: Secondary | ICD-10-CM | POA: Insufficient documentation

## 2021-10-05 DIAGNOSIS — Z87898 Personal history of other specified conditions: Secondary | ICD-10-CM

## 2021-10-05 LAB — POCT URINE DRUG SCREEN - MANUAL ENTRY (I-SCREEN)
POC Amphetamine UR: POSITIVE — AB
POC Buprenorphine (BUP): NOT DETECTED
POC Cocaine UR: NOT DETECTED
POC Marijuana UR: POSITIVE — AB
POC Methadone UR: NOT DETECTED
POC Methamphetamine UR: POSITIVE — AB
POC Morphine: NOT DETECTED
POC Oxazepam (BZO): NOT DETECTED
POC Oxycodone UR: NOT DETECTED
POC Secobarbital (BAR): NOT DETECTED

## 2021-10-05 LAB — POC SARS CORONAVIRUS 2 AG: SARSCOV2ONAVIRUS 2 AG: NEGATIVE

## 2021-10-05 LAB — POC SARS CORONAVIRUS 2 AG -  ED: SARS Coronavirus 2 Ag: NEGATIVE

## 2021-10-05 MED ORDER — ACETAMINOPHEN 325 MG PO TABS
650.0000 mg | ORAL_TABLET | Freq: Four times a day (QID) | ORAL | Status: DC | PRN
  Administered 2021-10-06: 18:00:00 650 mg via ORAL
  Filled 2021-10-05: qty 2

## 2021-10-05 MED ORDER — TRAZODONE HCL 50 MG PO TABS: 50.0000 mg | ORAL_TABLET | Freq: Every evening | ORAL | Status: DC | PRN

## 2021-10-05 MED ORDER — HYDROXYZINE HCL 25 MG PO TABS: 25.0000 mg | ORAL_TABLET | Freq: Three times a day (TID) | ORAL | Status: DC | PRN

## 2021-10-05 MED ORDER — ALUM & MAG HYDROXIDE-SIMETH 200-200-20 MG/5ML PO SUSP: 30.0000 mL | ORAL | Status: DC | PRN

## 2021-10-05 MED ORDER — OLANZAPINE 5 MG PO TABS
5.0000 mg | ORAL_TABLET | Freq: Every day | ORAL | Status: DC
  Administered 2021-10-06: 5 mg via ORAL
  Filled 2021-10-05: qty 1

## 2021-10-05 MED ORDER — MAGNESIUM HYDROXIDE 400 MG/5ML PO SUSP: 30.0000 mL | Freq: Every day | ORAL | Status: DC | PRN

## 2021-10-05 NOTE — ED Provider Notes (Signed)
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 FDA under an Emergency Use Authorization (EUA). This EUA will remain  in effect (meaning this test can be used) for the duration of the COVID-19  declaration under Section 564(b)(1) of the Act, 21 U.S.C.section 360bbb-3(b)(1), unless the authorization is terminated  or revoked sooner.       Influenza A by  PCR 10/04/2021 NEGATIVE  NEGATIVE Final   Influenza B by PCR 10/04/2021 NEGATIVE  NEGATIVE Final   Comment: (NOTE) The Xpert Xpress SARS-CoV-2/FLU/RSV plus assay is intended as an aid in the diagnosis of influenza from Nasopharyngeal swab specimens and should not be used as a sole basis for treatment. Nasal washings and aspirates are unacceptable for Xpert Xpress SARS-CoV-2/FLU/RSV testing.  Fact Sheet for Patients: EntrepreneurPulse.com.au  Fact Sheet for Healthcare Providers: IncredibleEmployment.be  This test is not yet approved or cleared by the [place] FDA and has been authorized for detection and/or diagnosis of SARS-CoV-2 by FDA under an Emergency Use Authorization (EUA). This EUA will remain in effect (meaning this test can be used) for the duration of the COVID-19 declaration under Section 564(b)(1) of the Act, 21 U.S.C. section 360bbb-3(b)(1), unless the authorization is terminated or revoked.  Performed at [facility], [address] [place], [place] [address]    WBC 10/04/2021 7.0  4.0 - 10.5 K/uL Final   RBC 10/04/2021 4.62  4.22 - 5.81 MIL/uL Final   Hemoglobin 10/04/2021 15.5  13.0 - 17.0 g/dL Final   HCT 10/04/2021 44.0  39.0 - 52.0 % Final   MCV 10/04/2021 95.2  80.0 - 100.0 fL Final   MCH 10/04/2021 33.5  26.0 - 34.0 pg Final   MCHC 10/04/2021 35.2  30.0 - 36.0 g/dL Final   RDW 10/04/2021 12.5  11.5 - 15.5 % Final   Platelets 10/04/2021 282  150 - 400 K/uL Final   nRBC 10/04/2021 0.0  0.0 - 0.2 % Final   Neutrophils Relative % 10/04/2021 73  % Final   Neutro Abs 10/04/2021 5.2  1.7 - 7.7 K/uL Final   Lymphocytes Relative 10/04/2021 17  % Final   Lymphs Abs 10/04/2021 1.2  0.7 - 4.0 K/uL Final   Monocytes Relative 10/04/2021 7  % Final    Monocytes Absolute 10/04/2021 0.5  0.1 - 1.0 K/uL Final   Eosinophils Relative 10/04/2021 3  % Final   Eosinophils Absolute 10/04/2021 0.2  0.0 - 0.5 K/uL Final   Basophils Relative 10/04/2021 0  % Final   Basophils Absolute 10/04/2021 0.0  0.0 - 0.1 K/uL Final   Immature Granulocytes 10/04/2021 0  % Final   Abs Immature Granulocytes 10/04/2021 0.01  0.00 - 0.07 K/uL Final   Performed at [facility], [address] [place], [place] [address]  Admission on 09/06/2021, Discharged on 09/07/2021  Component Date Value Ref Range Status   Sodium 09/06/2021 132 (L)  135 - 145 mmol/L Final   Potassium 09/06/2021 3.6  3.5 - 5.1 mmol/L Final   Chloride 09/06/2021 97 (L)  98 - 111 mmol/L Final   CO2 09/06/2021 25  22 - 32 mmol/L Final   Glucose, Bld 09/06/2021 114 (H)  70 - 99 mg/dL Final   Glucose reference range applies only to samples taken after fasting for at least 8 hours.   BUN 09/06/2021 9  6 - 20 mg/dL Final   Creatinine, Ser 09/06/2021 0.99  0.61 - 1.24 mg/dL Final   Calcium 09/06/2021 9.9  8.9 - 10.3 mg/dL Final   Total Protein 09/06/2021 6.9  6.5 - 8.1 g/dL Final   Albumin 09/06/2021 4.1  3.5 - 5.0 g/dL Final   AST 09/06/2021 35  15 - 41 U/L Final   ALT 09/06/2021 21  0 - 44 U/L Final   Alkaline Phosphatase 09/06/2021 70  38 - 126 U/L Final   Total Bilirubin 09/06/2021 1.1  0.3 -  1.2 mg/dL Final   GFR, Estimated 09/06/2021 >60  >60 mL/min Final   Comment: (NOTE) Calculated using the CKD-EPI Creatinine Equation (2021)    Anion gap 09/06/2021 10  5 - 15 Final   Performed at [facility], [address] [place], [place] [address]   Alcohol, Ethyl (B) 09/06/2021 <10  <10 mg/dL Final   Comment: (NOTE) Lowest detectable limit for serum alcohol is 10 mg/dL.  For medical purposes only. Performed at [facility], [address] [place], [place] [address]    Salicylate Lvl 64/33/2951 <7.0 (L)  7.0 - 30.0 mg/dL Final   Performed at [facility],  [place], [place] [address]   Acetaminophen (Tylenol), Serum 09/06/2021 <10 (L)  10 - 30 ug/mL Final   Comment: (NOTE) Therapeutic concentrations vary significantly. A range of 10-30 ug/mL  may be an effective concentration for many patients. However, some  are best treated at concentrations outside of this range. Acetaminophen concentrations >150 ug/mL at 4 hours after ingestion  and >50 ug/mL at 12 hours after ingestion are often associated with  toxic reactions.  Performed at [facility], [address] [place], [place] [address]    WBC 09/06/2021 9.5  4.0 - 10.5 K/uL Final   RBC 09/06/2021 4.20 (L)  4.22 - 5.81 MIL/uL Final   Hemoglobin 09/06/2021 14.7  13.0 - 17.0 g/dL Final   HCT 09/06/2021 40.0  39.0 - 52.0 % Final   MCV 09/06/2021 95.2  80.0 - 100.0 fL Final   MCH 09/06/2021 35.0 (H)  26.0 - 34.0 pg Final   MCHC 09/06/2021 36.8 (H)  30.0 - 36.0 g/dL Final   RDW 09/06/2021 12.5  11.5 - 15.5 % Final   Platelets 09/06/2021 316  150 - 400 K/uL Final   nRBC 09/06/2021 0.0  0.0 - 0.2 % Final   Performed at [facility], [address] [place], [place] [address]   Tricyclic, Ur Screen 93/23/5573 NONE DETECTED  NONE DETECTED Final   Amphetamines, Ur Screen 09/06/2021 POSITIVE (A)  NONE DETECTED Final   MDMA (Ecstasy)Ur Screen 09/06/2021 NONE DETECTED  NONE DETECTED Final   Cocaine Metabolite,Ur [place] 09/06/2021 NONE DETECTED  NONE DETECTED Final   Opiate, Ur Screen 09/06/2021 NONE DETECTED  NONE DETECTED Final   Phencyclidine (PCP) Ur S 09/06/2021 NONE DETECTED  NONE DETECTED Final   Cannabinoid 50 Ng, Ur [place] 09/06/2021 POSITIVE (A)  NONE DETECTED Final   Barbiturates, Ur Screen 09/06/2021 NONE DETECTED  NONE DETECTED Final   Benzodiazepine, Ur Scrn 09/06/2021 NONE DETECTED  NONE DETECTED Final   Methadone Scn, Ur 09/06/2021 NONE DETECTED  NONE DETECTED Final   Comment: (NOTE) Tricyclics + metabolites, urine    Cutoff 1000 ng/mL Amphetamines +  metabolites, urine  Cutoff 1000 ng/mL MDMA (Ecstasy), urine              Cutoff 500 ng/mL Cocaine Metabolite, urine          Cutoff 300 ng/mL Opiate + metabolites, urine        Cutoff 300 ng/mL Phencyclidine (PCP), urine         Cutoff 25 ng/mL Cannabinoid, urine                 Cutoff 50 ng/mL Barbiturates + metabolites, urine  Cutoff 200 ng/mL Benzodiazepine, urine              Cutoff 200 ng/mL Methadone, urine  Cutoff 300 ng/mL  The urine drug screen provides only a preliminary, unconfirmed analytical test result and should not be used for non-medical purposes. Clinical consideration and professional judgment should be applied to any positive drug screen result due to possible interfering substances. A more specific alternate chemical method must be used in order to obtain a confirmed analytical result. Gas chromatography / mass spectrometry (GC/MS) is the preferred confirm                          atory method. Performed at [facility], [place], [place] [address]    Troponin I (High Sensitivity) 09/06/2021 10  <18 ng/L Final   Comment: (NOTE) Elevated high sensitivity troponin I (hsTnI) values and significant  changes across serial measurements may suggest ACS but many other  chronic and acute conditions are known to elevate hsTnI results.  Refer to the "Links" section for chest pain algorithms and additional  guidance. Performed at [facility], [address] [place], [place] [address]    SARS Coronavirus 2 by RT PCR 09/06/2021 NEGATIVE  NEGATIVE Final   Comment: (NOTE) SARS-CoV-2 target nucleic acids are NOT DETECTED.  The SARS-CoV-2 RNA is generally detectable in upper respiratory specimens during the acute phase of infection. The lowest concentration of SARS-CoV-2 viral copies this assay can detect is 138 copies/mL. A negative result does not preclude SARS-Cov-2 infection and should not be used as the sole basis  for treatment or other patient management decisions. A negative result may occur with  improper specimen collection/handling, submission of specimen other than nasopharyngeal swab, presence of viral mutation(s) within the areas targeted by this assay, and inadequate number of viral copies(<138 copies/mL). A negative result must be combined with clinical observations, patient history, and epidemiological information. The expected result is Negative.  Fact Sheet for Patients:  EntrepreneurPulse.com.au  Fact Sheet for Healthcare Providers:  IncredibleEmployment.be  This test is no                          t yet approved or cleared by the [place] FDA and  has been authorized for detection and/or diagnosis of SARS-CoV-2 by FDA under an Emergency Use Authorization (EUA). This EUA will remain  in effect (meaning this test can be used) for the duration of the COVID-19 declaration under Section 564(b)(1) of the Act, 21 U.S.C.section 360bbb-3(b)(1), unless the authorization is terminated  or revoked sooner.       Influenza A by PCR 09/06/2021 NEGATIVE  NEGATIVE Final   Influenza B by PCR 09/06/2021 NEGATIVE  NEGATIVE Final   Comment: (NOTE) The Xpert Xpress SARS-CoV-2/FLU/RSV plus assay is intended as an aid in the diagnosis of influenza from Nasopharyngeal swab specimens and should not be used as a sole basis for treatment. Nasal washings and aspirates are unacceptable for Xpert Xpress SARS-CoV-2/FLU/RSV testing.  Fact Sheet for Patients: EntrepreneurPulse.com.au  Fact Sheet for Healthcare Providers: IncredibleEmployment.be  This test is not yet approved or cleared by the [place] FDA and has been authorized for detection and/or diagnosis of SARS-CoV-2 by FDA under an Emergency Use Authorization (EUA). This EUA will remain in effect (meaning this test can be used) for the duration of the COVID-19  declaration under Section 564(b)(1) of the Act, 21 U.S.C. section 360bbb-3(b)(1), unless the authorization is terminated or revoked.  Performed at [facility], [address], [place], [place] [address]   Hospital  Outpatient Visit on 05/17/2021  Component Date Value Ref Range Status   Glucose-Capillary 05/17/2021 108 (H)  70 - 99 mg/dL Final   Glucose reference range applies only to samples taken after fasting for at least 8 hours.  Appointment on 04/08/2021  Component Date Value Ref Range Status   Magnesium 04/08/2021 2.2  1.7 - 2.4 mg/dL Final   Performed at [facility], [address] [place], [place] [address]   Sodium 04/08/2021 139  135 - 145 mmol/L Final   Potassium 04/08/2021 4.0  3.5 - 5.1 mmol/L Final   Chloride 04/08/2021 104  98 - 111 mmol/L Final   CO2 04/08/2021 28  22 - 32 mmol/L Final   Glucose, Bld 04/08/2021 120 (H)  70 - 99 mg/dL Final   Glucose reference range applies only to samples taken after fasting for at least 8 hours.   BUN 04/08/2021 16  6 - 20 mg/dL Final   Creatinine, Ser 04/08/2021 1.00  0.61 - 1.24 mg/dL Final   Calcium 04/08/2021 8.6 (L)  8.9 - 10.3 mg/dL Final   GFR, Estimated 04/08/2021 >60  >60 mL/min Final   Comment: (NOTE) Calculated using the CKD-EPI Creatinine Equation (2021)    Anion gap 04/08/2021 7  5 - 15 Final   Performed at [facility], [address] [place], [place] [address]   WBC 04/08/2021 7.3  4.0 - 10.5 K/uL Final   RBC 04/08/2021 3.91 (L)  4.22 - 5.81 MIL/uL Final   Hemoglobin 04/08/2021 13.3  13.0 - 17.0 g/dL Final   HCT 04/08/2021 37.2 (L)  39.0 - 52.0 % Final   MCV 04/08/2021 95.1  80.0 - 100.0 fL Final   MCH 04/08/2021 34.0  26.0 - 34.0 pg Final   MCHC 04/08/2021 35.8  30.0 - 36.0 g/dL Final   RDW 04/08/2021 11.1 (L)  11.5 - 15.5 % Final   Platelets 04/08/2021 260  150 - 400 K/uL Final   nRBC 04/08/2021 0.0  0.0 - 0.2 % Final   Neutrophils Relative % 04/08/2021 70  % Final   Neutro Abs  04/08/2021 5.1  1.7 - 7.7 K/uL Final   Lymphocytes Relative 04/08/2021 21  % Final   Lymphs Abs 04/08/2021 1.5  0.7 - 4.0 K/uL Final   Monocytes Relative 04/08/2021 7  % Final   Monocytes Absolute 04/08/2021 0.5  0.1 - 1.0 K/uL Final   Eosinophils Relative 04/08/2021 2  % Final   Eosinophils Absolute 04/08/2021 0.1  0.0 - 0.5 K/uL Final   Basophils Relative 04/08/2021 0  % Final   Basophils Absolute 04/08/2021 0.0  0.0 - 0.1 K/uL Final   Immature Granulocytes 04/08/2021 0  % Final   Abs Immature Granulocytes 04/08/2021 0.01  0.00 - 0.07 K/uL Final   Performed at [facility], [address] [place], [place] [address]    Allergies: Patient has no known allergies.  PTA Medications: (Not in a hospital admission)   Medical Decision Making  Admit to continuous assessment for crisis stabilization.  Start Zyprexa 5 mg QHS for substance induced paranoia  QTC 438 on 10/03/21-will not repeat EKG  Clinical Course as of 10/06/21 0248  Thu Oct 06, 2021  0143 CBC with Differential/Platelet(!) CBC unremarkable [JB]  0143 POCT Urine Drug Screen - ([facility])(!) UDS positive for methamphetamines and marijuana [JB]  0243 Lipid panel(!) Cholesterol and LDL slightly elevated [JB]  0243 TSH(!): 4.945 TSH slightly elevated-will add free T4 to labs [JB]  0244 Hemoglobin A1C: 5.4 [JB]  0244 Alcohol, Ethyl (B): <10 [JB]  0245 Comprehensive metabolic panel(!) CMP unremarkable [JB]    Clinical Course User Index [JB] Rozetta Nunnery, NP    Recommendations  Based on my evaluation the patient does not appear to have an emergency medical condition.  Rozetta Nunnery, NP 10/06/21  2:48 AM

## 2021-10-05 NOTE — Progress Notes (Signed)
Nutrition  RD walking through cancer center lobby and patient came up to RD asking if RD remembered him.  Patient has history of cancer but not currently under treatment.  Says that he needs help.  He is worried about his fiance Hinton Dyer.  Says that black guys in red and spanish people are following him and was on the [place] bus.  Talked about an old man with a walker getting on the [place] bus.  "The old man is involved too."  "They are going to kill my fiance."  "Please help me."  " I know you do not believe me."   [place] notified by RD and speaking with patient.   Tyshea Imel B. Zenia Resides, [place], [place] Registered Dietitian [phone] (mobile)

## 2021-10-06 ENCOUNTER — Encounter (HOSPITAL_COMMUNITY): Payer: Self-pay

## 2021-10-06 ENCOUNTER — Other Ambulatory Visit: Payer: Self-pay

## 2021-10-06 LAB — CBC WITH DIFFERENTIAL/PLATELET
Abs Immature Granulocytes: 0.03 10*3/uL (ref 0.00–0.07)
Basophils Absolute: 0 10*3/uL (ref 0.0–0.1)
Basophils Relative: 1 %
Eosinophils Absolute: 0.2 10*3/uL (ref 0.0–0.5)
Eosinophils Relative: 2 %
HCT: 42.4 % (ref 39.0–52.0)
Hemoglobin: 15.3 g/dL (ref 13.0–17.0)
Immature Granulocytes: 0 %
Lymphocytes Relative: 17 %
Lymphs Abs: 1.3 10*3/uL (ref 0.7–4.0)
MCH: 34.4 pg — ABNORMAL HIGH (ref 26.0–34.0)
MCHC: 36.1 g/dL — ABNORMAL HIGH (ref 30.0–36.0)
MCV: 95.3 fL (ref 80.0–100.0)
Monocytes Absolute: 0.6 10*3/uL (ref 0.1–1.0)
Monocytes Relative: 8 %
Neutro Abs: 5.4 10*3/uL (ref 1.7–7.7)
Neutrophils Relative %: 72 %
Platelets: 312 10*3/uL (ref 150–400)
RBC: 4.45 MIL/uL (ref 4.22–5.81)
RDW: 12.1 % (ref 11.5–15.5)
WBC: 7.4 10*3/uL (ref 4.0–10.5)
nRBC: 0 % (ref 0.0–0.2)

## 2021-10-06 LAB — COMPREHENSIVE METABOLIC PANEL
ALT: 20 U/L (ref 0–44)
AST: 20 U/L (ref 15–41)
Albumin: 4.3 g/dL (ref 3.5–5.0)
Alkaline Phosphatase: 80 U/L (ref 38–126)
Anion gap: 11 (ref 5–15)
BUN: 13 mg/dL (ref 6–20)
CO2: 28 mmol/L (ref 22–32)
Calcium: 9.6 mg/dL (ref 8.9–10.3)
Chloride: 95 mmol/L — ABNORMAL LOW (ref 98–111)
Creatinine, Ser: 0.96 mg/dL (ref 0.61–1.24)
GFR, Estimated: >60 mL/min (ref >60)
Glucose, Bld: 84 mg/dL (ref 70–99)
Potassium: 4.5 mmol/L (ref 3.5–5.1)
Sodium: 134 mmol/L — ABNORMAL LOW (ref 135–145)
Total Bilirubin: 0.8 mg/dL (ref 0.3–1.2)
Total Protein: 6.7 g/dL (ref 6.5–8.1)

## 2021-10-06 LAB — LIPID PANEL
Cholesterol: 203 mg/dL — ABNORMAL HIGH (ref 0–200)
HDL: 57 mg/dL (ref >40)
LDL Cholesterol: 133 mg/dL — ABNORMAL HIGH (ref 0–99)
Total CHOL/HDL Ratio: 3.6 RATIO
Triglycerides: 64 mg/dL (ref <150)
VLDL: 13 mg/dL (ref 0–40)

## 2021-10-06 LAB — HEMOGLOBIN A1C
Hgb A1c MFr Bld: 5.4 % (ref 4.8–5.6)
Mean Plasma Glucose: 108.28 mg/dL

## 2021-10-06 LAB — RESP PANEL BY RT-PCR (FLU A&B, COVID) ARPGX2
Influenza A by PCR: NEGATIVE
Influenza B by PCR: NEGATIVE
SARS Coronavirus 2 by RT PCR: NEGATIVE

## 2021-10-06 LAB — ETHANOL: Alcohol, Ethyl (B): <10 mg/dL (ref <10)

## 2021-10-06 LAB — TSH: TSH: 4.945 u[IU]/mL — ABNORMAL HIGH (ref 0.350–4.500)

## 2021-10-06 MED ORDER — OLANZAPINE 10 MG PO TABS
10.0000 mg | ORAL_TABLET | Freq: Every day | ORAL | Status: DC
  Administered 2021-10-06: 21:00:00 10 mg via ORAL
  Filled 2021-10-06: qty 1

## 2021-10-06 NOTE — ED Notes (Signed)
Pt is in the bed sleeping. Respirations are even and unlabored. No acute distress noted. Will continue to monitor for safety. 

## 2021-10-06 NOTE — ED Notes (Signed)
Pt  admitted to [facility] due to polysubstance abuse with substance induced psychosis. Patient denies SI,HI,AVH at present. Belongings inventoried. Patient oriented to unit and unit rules. Meal and drinks offered to patient.  Patient verbalized agreement to treatment plans.Will monitor for safety.
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
## 2021-10-06 NOTE — Progress Notes (Signed)
Patient awake and resting on bed.  Denied SI, AVH.  Endorsed HI, but stated he'd already talked about it multiple times.  Given breakfast.

## 2021-10-06 NOTE — ED Provider Notes (Addendum)
Behavioral Health Progress Note  Date and Time: 10/06/2021 12:13 PM Name: Paul Hebert MRN:  [id number]  Subjective:  Paul Hebert is a 50 y.o. male history of polysubstance abuse with substance-induced psychosis and squamous cell carcinoma of the oropharynx who presents voluntarily to [facility] with law enforcement for an evaluation. Patient was seen at [facility] ED on 10/04/21 with a similar presentation. Patient states "I have had a hell of a last 3 weeks."  Patient reports that on Sunday of this week he was at his cousin's house and witnessed a drug transaction. He states someone there realize he witnessed the transaction and since that time they have been trying to kidnap him. He provides a lengthy account of gangs following him around elements of counting in a gray van.  He states "the bloods and a Spanish gang are trying to kill me." He states that when he was discharged from [facility] ED that someone tried to kidnap him. He states he called the vice squad with the [employer].  He states that the police took him to the [place] truck stop to get him out of [facility] because they were afraid that someone was trying to harm him. He states that the [place] police brought him to [place]. Patient denies suicidal ideations. He denies homicidal ideations. He reports history of alcohol use.  States in the past that he drank 3 (16 ounce) beers daily. He states he has not had any alcohol in 1 to 2 weeks.  He reports daily use of marijuana.  He reports that he has used methamphetamines 10 times in his life. He reports last use of meth on this past Sunday.  He reports a history of using crack/cocaine 2 to 3 days a week. He reports he has not used crack/cocaine in 1 to 2 weeks. UDS positive for amphetamines, methamphetamines, and marijuana.  BAL less than 10.   Patient seen and re-evaluated face-to-face by this provider, chart reviewed and case discussed with Dr. Serafina Mitchell. On evaluation, patient is alert  and oriented x4. His thought process is is logical with delusional and paranoia thought content. His speech is clear and coherent. His mood is anxious, fearful and affect is congruent.  Patient reports that he is worried about all the crap that is currently going on. He reports that on Sunday he saw a drug transaction go down at his cousin's house and gang members thought that he bugged his cousin's house. He states that gang members are trying to kill him. He states that they are following him. He states that the gang members went to his fiance house and left a stocking next to her car indicating that they know where she lives. He states, "if I leave they will kill me." Patient begins pacing and crying. Patient is noted to be carrying around a business card for a Jarvis Newcomer with [employer]. He states that he needs to get in contact with the [employer] in [place] to find out what to do next. He states that he can not go back to [facility]. He states that gang members are trying to kill him and it has nothing to do with him using meth on Sunday. He states, "you think I am delusional but there are gang members trying to kill me."   Today, patient denies suicidal and homicidal ideations. Patient denies auditory and visual hallucinations. There is no indication that the patient is responding to internal or external stimuli. Patient reports fair  sleep. Patient reports a fair appetite. Patient reports tolerating the Zyprexa without any side effects.  Patient gives verbal consent for this provider to speak with Hinton Dyer [phone]. Provider called via telephone, no answer.   Patient gives verbal consent to speak with Mariam Dollar.   Hedy Camara CSW., has contacted [place], no answer.   Update: per Hedy Camara, CSW., Mariam Dollar called back and states that they have not active case with pt. he states that when interviewing him yesterday it seemed to be paranoia instead. He  states that in the past pt claimed that staff at [facility] were calling gang members to come get him, that everyone was out to get him.  Diagnosis:  Final diagnoses:  Substance-induced psychotic disorder ([place])  Methamphetamine abuse ([place])  History of alcohol use disorder  Cocaine use disorder ([place])    Total Time spent with patient: 15 minutes  Past Psychiatric History: polysubstance abuse with substance-induced psychosis Past Medical History:  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([place])     Past Surgical History:  Procedure Laterality Date   PORT-A-CATH REMOVAL N/A 02/04/2021   Procedure: REMOVAL PORT-A-CATH;  Surgeon: Olean Ree, MD;  Location: [facility] ORS;  Service: General;  Laterality: N/A;   PORTA CATH INSERTION N/A 10/25/2020   Procedure: PORTA CATH INSERTION;  Surgeon: Algernon Huxley, MD;  Location: [facility];  Service: Cardiovascular;  Laterality: N/A;   REMOVAL OF GASTROSTOMY TUBE N/A 02/04/2021   Procedure: REMOVAL OF GASTROSTOMY TUBE;  Surgeon: Olean Ree, MD;  Location: [facility] ORS;  Service: General;  Laterality: N/A;   Family History:  Family History  Problem Relation Age of Onset   Cancer Father    Family Psychiatric  History: none reported. Social History:  Social History   Substance and Sexual Activity  Alcohol Use Yes   Comment: 12  week     Social History   Substance and Sexual Activity  Drug Use Yes   Types: Marijuana, "Crack" cocaine, Methamphetamines   Comment: 2 months last time usine    Social History   Socioeconomic History   Marital status: Single    Spouse name: Not on file   Number of children: Not on file   Years of education: Not on file   Highest education level: Not on file  Occupational History   Not on file  Tobacco Use   Smoking status: Every Day    Packs/day: 1.00    Types: Cigarettes   Smokeless tobacco: Current   Tobacco comments:    Patches very rarely  Vaping Use   Vaping Use: Never used   Substance and Sexual Activity   Alcohol use: Yes    Comment: 12  week   Drug use: Yes    Types: Marijuana, "Crack" cocaine, Methamphetamines    Comment: 2 months last time usine   Sexual activity: Not Currently  Other Topics Concern   Not on file  Social History Narrative   Not on file   Social Determinants of Health   Financial Resource Strain: Not on file  Food Insecurity: Not on file  Transportation Needs: Not on file  Physical Activity: Not on file  Stress: Not on file  Social Connections: Not on file   SDOH:  SDOH Screenings   Alcohol Screen: Medium Risk   Last Alcohol Screening Score (AUDIT): 18  Depression (PHQ2-9): Medium Risk   PHQ-2 Score: 10  Financial Resource Strain: Not on file  Food Insecurity: Not on  file  Housing: Not on file  Physical Activity: Not on file  Social Connections: Not on file  Stress: Not on file  Tobacco Use: High Risk   Smoking Tobacco Use: Every Day   Smokeless Tobacco Use: Current   Passive Exposure: Not on file  Transportation Needs: Not on file   Additional Social History:    Pain Medications: See MAR Prescriptions: See MAR Over the Counter: See MAR History of alcohol / drug use?: Yes Longest period of sobriety (when/how long): Unknown Negative Consequences of Use: Personal relationships, Legal Withdrawal Symptoms: Tremors Name of Substance 1: Methamphetamine 1 - Age of First Use: In 2020 1 - Amount (size/oz): 1/2 gram 1 - Frequency: 10x ever since first use in 2020 1 - Duration: N/A 1 - Last Use / Amount: Friday, October 01, 2021 1 - Method of Aquiring: Designer, fashion/clothing 1- Route of Use: Smoke Name of Substance 2: Crack 2 - Age of First Use: 14 2 - Amount (size/oz): $20 - $30 2 - Frequency: 2-3 days/week 2 - Duration: Unknown 2 - Last Use / Amount: Over 1 week ago 2 - Method of Aquiring: Illegal purchase 2 - Route of Substance Use: Smoke Name of Substance 3: EtOH 3 - Age of First Use: 14 3 - Amount (size/oz): 3  16-20 ounce beers 3 - Frequency: Daily 3 - Duration: Since age 31 3 - Last Use / Amount: 2 weeks ago 3 - Method of Aquiring: Purchase 3 - Route of Substance Use: Oral Name of Substance 4: THC 4 - Age of First Use: 14 4 - Amount (size/oz): Varies 4 - Frequency: Daily 4 - Duration: Since age 47 4 - Last Use / Amount: Today 4 - Method of Aquiring: Purchase 4 - Route of Substance Use: Smoke    Current Medications:  Current Facility-Administered Medications  Medication Dose Route Frequency Provider Last Rate Last Admin   acetaminophen (TYLENOL) tablet 650 mg  650 mg Oral Q6H PRN Rozetta Nunnery, NP       alum & mag hydroxide-simeth (MAALOX/MYLANTA) 200-200-20 MG/5ML suspension 30 mL  30 mL Oral Q4H PRN Rozetta Nunnery, NP       hydrOXYzine (ATARAX) tablet 25 mg  25 mg Oral TID PRN Rozetta Nunnery, NP       magnesium hydroxide (MILK OF MAGNESIA) suspension 30 mL  30 mL Oral Daily PRN Rozetta Nunnery, NP       OLANZapine (ZYPREXA) tablet 5 mg  5 mg Oral QHS Lindon Romp A, NP   5 mg at 10/06/21 0025   traZODone (DESYREL) tablet 50 mg  50 mg Oral QHS PRN Rozetta Nunnery, NP       Current Outpatient Medications  Medication Sig Dispense Refill   hydrocortisone cream 1 % Apply 1 application topically 2 (two) times daily as needed (Apply to affected areas on face.).     Multiple Vitamins-Minerals (ADULT GUMMY PO) Take 2 tablets by mouth daily.      Labs  Lab Results:  Admission on 10/05/2021  Component Date Value Ref Range Status   SARS Coronavirus 2 by RT PCR 10/05/2021 NEGATIVE  NEGATIVE Final   Comment: (NOTE) SARS-CoV-2 target nucleic acids are NOT DETECTED.  The SARS-CoV-2 RNA is generally detectable in upper respiratory specimens during the acute phase of infection. The lowest concentration of SARS-CoV-2 viral copies this assay can detect is 138 copies/mL. A negative result does not preclude SARS-Cov-2 infection and should not be used as the sole basis for  treatment or other patient  management decisions. A negative result may occur with  improper specimen collection/handling, submission of specimen other than nasopharyngeal swab, presence of viral mutation(s) within the areas targeted by this assay, and inadequate number of viral copies(<138 copies/mL). A negative result must be combined with clinical observations, patient history, and epidemiological information. The expected result is Negative.  Fact Sheet for Patients:  EntrepreneurPulse.com.au  Fact Sheet for Healthcare Providers:  IncredibleEmployment.be  This test is no                          t yet approved or cleared by the [place] FDA and  has been authorized for detection and/or diagnosis of SARS-CoV-2 by FDA under an Emergency Use Authorization (EUA). This EUA will remain  in effect (meaning this test can be used) for the duration of the COVID-19 declaration under Section 564(b)(1) of the Act, 21 U.S.C.section 360bbb-3(b)(1), unless the authorization is terminated  or revoked sooner.       Influenza A by PCR 10/05/2021 NEGATIVE  NEGATIVE Final   Influenza B by PCR 10/05/2021 NEGATIVE  NEGATIVE Final   Comment: (NOTE) The Xpert Xpress SARS-CoV-2/FLU/RSV plus assay is intended as an aid in the diagnosis of influenza from Nasopharyngeal swab specimens and should not be used as a sole basis for treatment. Nasal washings and aspirates are unacceptable for Xpert Xpress SARS-CoV-2/FLU/RSV testing.  Fact Sheet for Patients: EntrepreneurPulse.com.au  Fact Sheet for Healthcare Providers: IncredibleEmployment.be  This test is not yet approved or cleared by the [place] FDA and has been authorized for detection and/or diagnosis of SARS-CoV-2 by FDA under an Emergency Use Authorization (EUA). This EUA will remain in effect (meaning this test can be used) for the duration of the COVID-19 declaration under Section 564(b)(1)  of the Act, 21 U.S.C. section 360bbb-3(b)(1), unless the authorization is terminated or revoked.  Performed at [facility], [place] [address], [place], [place] [address]    SARS Coronavirus 2 Ag 10/05/2021 Negative  Negative Preliminary   WBC 10/05/2021 7.4  4.0 - 10.5 K/uL Final   RBC 10/05/2021 4.45  4.22 - 5.81 MIL/uL Final   Hemoglobin 10/05/2021 15.3  13.0 - 17.0 g/dL Final   HCT 10/05/2021 42.4  39.0 - 52.0 % Final   MCV 10/05/2021 95.3  80.0 - 100.0 fL Final   MCH 10/05/2021 34.4 (H)  26.0 - 34.0 pg Final   MCHC 10/05/2021 36.1 (H)  30.0 - 36.0 g/dL Final   RDW 10/05/2021 12.1  11.5 - 15.5 % Final   Platelets 10/05/2021 312  150 - 400 K/uL Final   nRBC 10/05/2021 0.0  0.0 - 0.2 % Final   Neutrophils Relative % 10/05/2021 72  % Final   Neutro Abs 10/05/2021 5.4  1.7 - 7.7 K/uL Final   Lymphocytes Relative 10/05/2021 17  % Final   Lymphs Abs 10/05/2021 1.3  0.7 - 4.0 K/uL Final   Monocytes Relative 10/05/2021 8  % Final   Monocytes Absolute 10/05/2021 0.6  0.1 - 1.0 K/uL Final   Eosinophils Relative 10/05/2021 2  % Final   Eosinophils Absolute 10/05/2021 0.2  0.0 - 0.5 K/uL Final   Basophils Relative 10/05/2021 1  % Final   Basophils Absolute 10/05/2021 0.0  0.0 - 0.1 K/uL Final   Immature Granulocytes 10/05/2021 0  % Final   Abs Immature Granulocytes 10/05/2021 0.03  0.00 - 0.07 K/uL Final   Performed at [facility]  Lab, [address], [place], [place] [address]   Sodium 10/05/2021 134 (L)  135 - 145 mmol/L Final   Potassium 10/05/2021 4.5  3.5 - 5.1 mmol/L Final   Chloride 10/05/2021 95 (L)  98 - 111 mmol/L Final   CO2 10/05/2021 28  22 - 32 mmol/L Final   Glucose, Bld 10/05/2021 84  70 - 99 mg/dL Final   Glucose reference range applies only to samples taken after fasting for at least 8 hours.   BUN 10/05/2021 13  6 - 20 mg/dL Final   Creatinine, Ser 10/05/2021 0.96  0.61 - 1.24 mg/dL Final   Calcium 10/05/2021 9.6  8.9 - 10.3 mg/dL Final   Total Protein  10/05/2021 6.7  6.5 - 8.1 g/dL Final   Albumin 10/05/2021 4.3  3.5 - 5.0 g/dL Final   AST 10/05/2021 20  15 - 41 U/L Final   ALT 10/05/2021 20  0 - 44 U/L Final   Alkaline Phosphatase 10/05/2021 80  38 - 126 U/L Final   Total Bilirubin 10/05/2021 0.8  0.3 - 1.2 mg/dL Final   GFR, Estimated 10/05/2021 >60  >60 mL/min Final   Comment: (NOTE) Calculated using the CKD-EPI Creatinine Equation (2021)    Anion gap 10/05/2021 11  5 - 15 Final   Performed at [facility] [address], [place], [place] [address]   Hgb A1c MFr Bld 10/05/2021 5.4  4.8 - 5.6 % Final   Comment: (NOTE) Pre diabetes:          5.7%-6.4%  Diabetes:              >6.4%  Glycemic control for   <7.0% adults with diabetes    Mean Plasma Glucose 10/05/2021 108.28  mg/dL Final   Performed at [facility], [place] [address], [place], [place] [address]   Alcohol, Ethyl (B) 10/05/2021 <10  <10 mg/dL Final   Comment: (NOTE) Lowest detectable limit for serum alcohol is 10 mg/dL.  For medical purposes only. Performed at [facility], [place] [address], [place], [place] [address]    Cholesterol 10/05/2021 203 (H)  0 - 200 mg/dL Final   Triglycerides 10/05/2021 64  <150 mg/dL Final   HDL 10/05/2021 57  >40 mg/dL Final   Total CHOL/HDL Ratio 10/05/2021 3.6  RATIO Final   VLDL 10/05/2021 13  0 - 40 mg/dL Final   LDL Cholesterol 10/05/2021 133 (H)  0 - 99 mg/dL Final   Comment:        Total Cholesterol/HDL:CHD Risk Coronary Heart Disease Risk Table                     Men   Women  1/2 Average Risk   3.4   3.3  Average Risk       5.0   4.4  2 X Average Risk   9.6   7.1  3 X Average Risk  23.4   11.0        Use the calculated Patient Ratio above and the CHD Risk Table to determine the patient's CHD Risk.        ATP III CLASSIFICATION (LDL):  <100     mg/dL   Optimal  100-129  mg/dL   Near or Above                    Optimal  130-159  mg/dL   Borderline  160-189  mg/dL   High  >190  mg/dL   Very  High Performed at [facility], [place] [address], [place], [place] [address]    TSH 10/05/2021 4.945 (H)  0.350 - 4.500 uIU/mL Final   Comment: Performed by a 3rd Generation assay with a functional sensitivity of <=0.01 uIU/mL. Performed at [facility], [place] [address], [place], [place] [address]    POC Amphetamine UR 10/05/2021 Positive (A)  NONE DETECTED (Cut Off Level 1000 ng/mL) Final   POC Secobarbital (BAR) 10/05/2021 None Detected  NONE DETECTED (Cut Off Level 300 ng/mL) Final   POC Buprenorphine (BUP) 10/05/2021 None Detected  NONE DETECTED (Cut Off Level 10 ng/mL) Final   POC Oxazepam (BZO) 10/05/2021 None Detected  NONE DETECTED (Cut Off Level 300 ng/mL) Final   POC Cocaine UR 10/05/2021 None Detected  NONE DETECTED (Cut Off Level 300 ng/mL) Final   POC Methamphetamine UR 10/05/2021 Positive (A)  NONE DETECTED (Cut Off Level 1000 ng/mL) Final   POC Morphine 10/05/2021 None Detected  NONE DETECTED (Cut Off Level 300 ng/mL) Final   POC Oxycodone UR 10/05/2021 None Detected  NONE DETECTED (Cut Off Level 100 ng/mL) Final   POC Methadone UR 10/05/2021 None Detected  NONE DETECTED (Cut Off Level 300 ng/mL) Final   POC Marijuana UR 10/05/2021 Positive (A)  NONE DETECTED (Cut Off Level 50 ng/mL) Final   SARSCOV2ONAVIRUS 2 AG 10/05/2021 NEGATIVE  NEGATIVE Final   Comment: (NOTE) SARS-CoV-2 antigen NOT DETECTED.   Negative results are presumptive.  Negative results do not preclude SARS-CoV-2 infection and should not be used as the sole basis for treatment or other patient management decisions, including infection  control decisions, particularly in the presence of clinical signs and  symptoms consistent with COVID-19, or in those who have been in contact with the virus.  Negative results must be combined with clinical observations, patient history, and epidemiological information. The expected result is Negative.  Fact Sheet for Patients:  HandmadeRecipes.com.cy  Fact Sheet for Healthcare Providers: FuneralLife.at  This test is not yet approved or cleared by the [place] FDA and  has been authorized for detection and/or diagnosis of SARS-CoV-2 by FDA under an Emergency Use Authorization (EUA).  This EUA will remain in effect (meaning this test can be used) for the duration of  the COV                          ID-19 declaration under Section 564(b)(1) of the Act, 21 U.S.C. section 360bbb-3(b)(1), unless the authorization is terminated or revoked sooner.    Admission on 10/03/2021, Discharged on 10/04/2021  Component Date Value Ref Range Status   Sodium 10/03/2021 132 (L)  135 - 145 mmol/L Final   Potassium 10/03/2021 3.3 (L)  3.5 - 5.1 mmol/L Final   Chloride 10/03/2021 98  98 - 111 mmol/L Final   CO2 10/03/2021 18 (L)  22 - 32 mmol/L Final   Glucose, Bld 10/03/2021 165 (H)  70 - 99 mg/dL Final   Glucose reference range applies only to samples taken after fasting for at least 8 hours.   BUN 10/03/2021 10  6 - 20 mg/dL Final   Creatinine, Ser 10/03/2021 1.18  0.61 - 1.24 mg/dL Final   Calcium 10/03/2021 9.5  8.9 - 10.3 mg/dL Final   Total Protein 10/03/2021 7.7  6.5 - 8.1 g/dL Final   Albumin 10/03/2021 4.6  3.5 - 5.0 g/dL Final   AST 10/03/2021 38  15 - 41 U/L Final  ALT 10/03/2021 21  0 - 44 U/L Final   Alkaline Phosphatase 10/03/2021 81  38 - 126 U/L Final   Total Bilirubin 10/03/2021 1.2  0.3 - 1.2 mg/dL Final   GFR, Estimated 10/03/2021 >60  >60 mL/min Final   Comment: (NOTE) Calculated using the CKD-EPI Creatinine Equation (2021)    Anion gap 10/03/2021 16 (H)  5 - 15 Final   Performed at [facility], [place], [place] [address]   Alcohol, Ethyl (B) 10/03/2021 <10  <10 mg/dL Final   Comment: (NOTE) Lowest detectable limit for serum alcohol is 10 mg/dL.  For medical purposes only. Performed at [facility], [address] [place], [place] [address]    Salicylate Lvl 04/14/1600 <7.0 (L)  7.0 - 30.0 mg/dL Final   Performed at [facility], [place], [place] [address]   Acetaminophen (Tylenol), Serum 10/03/2021 <10 (L)  10 - 30 ug/mL Final   Comment: (NOTE) Therapeutic concentrations vary significantly. A range of 10-30 ug/mL  may be an effective concentration for many patients. However, some  are best treated at concentrations outside of this range. Acetaminophen concentrations >150 ug/mL at 4 hours after ingestion  and >50 ug/mL at 12 hours after ingestion are often associated with  toxic reactions.  Performed at [facility], [place] [place], [place] [address]    WBC 10/03/2021 10.4  4.0 - 10.5 K/uL Final   RBC 10/03/2021 4.57  4.22 - 5.81 MIL/uL Final   Hemoglobin 10/03/2021 15.8  13.0 - 17.0 g/dL Final   HCT 10/03/2021 43.5  39.0 - 52.0 % Final   MCV 10/03/2021 95.2  80.0 - 100.0 fL Final   MCH 10/03/2021 34.6 (H)  26.0 - 34.0 pg Final   MCHC 10/03/2021 36.3 (H)  30.0 - 36.0 g/dL Final   RDW 10/03/2021 12.3  11.5 - 15.5 % Final   Platelets 10/03/2021 309  150 - 400 K/uL Final   nRBC 10/03/2021 0.0  0.0 - 0.2 % Final   Performed at [facility], [place], [place] [address]   Tricyclic, Ur Screen 27/03/2375 NONE DETECTED  NONE DETECTED Final   Amphetamines, Ur Screen 10/03/2021 POSITIVE (A)  NONE DETECTED Final   MDMA (Ecstasy)Ur Screen 10/03/2021 NONE DETECTED  NONE DETECTED Final   Cocaine Metabolite,Ur [place] 10/03/2021 POSITIVE (A)  NONE DETECTED Final   Opiate, Ur Screen 10/03/2021 NONE DETECTED  NONE DETECTED Final   Phencyclidine (PCP) Ur S 10/03/2021 NONE DETECTED  NONE DETECTED Final   Cannabinoid 50 Ng, Ur [place] 10/03/2021 POSITIVE (A)  NONE DETECTED Final   Barbiturates, Ur Screen 10/03/2021 NONE DETECTED  NONE DETECTED Final   Benzodiazepine, Ur Scrn 10/03/2021 NONE DETECTED  NONE DETECTED Final   Methadone Scn, Ur  10/03/2021 NONE DETECTED  NONE DETECTED Final   Comment: (NOTE) Tricyclics + metabolites, urine    Cutoff 1000 ng/mL Amphetamines + metabolites, urine  Cutoff 1000 ng/mL MDMA (Ecstasy), urine              Cutoff 500 ng/mL Cocaine Metabolite, urine          Cutoff 300 ng/mL Opiate + metabolites, urine        Cutoff 300 ng/mL Phencyclidine (PCP), urine         Cutoff 25 ng/mL Cannabinoid, urine                 Cutoff 50 ng/mL Barbiturates + metabolites, urine  Cutoff 200  ng/mL Benzodiazepine, urine              Cutoff 200 ng/mL Methadone, urine                   Cutoff 300 ng/mL  The urine drug screen provides only a preliminary, unconfirmed analytical test result and should not be used for non-medical purposes. Clinical consideration and professional judgment should be applied to any positive drug screen result due to possible interfering substances. A more specific alternate chemical method must be used in order to obtain a confirmed analytical result. Gas chromatography / mass spectrometry (GC/MS) is the preferred confirm                          atory method. Performed at [facility], [address] [place], [place] [address]    Total CK 10/03/2021 121  49 - 397 U/L Final   Performed at [facility], [place], [place] [address]   Sodium 10/03/2021 134 (L)  135 - 145 mmol/L Final   Potassium 10/03/2021 3.8  3.5 - 5.1 mmol/L Final   Chloride 10/03/2021 97 (L)  98 - 111 mmol/L Final   CO2 10/03/2021 28  22 - 32 mmol/L Final   Glucose, Bld 10/03/2021 106 (H)  70 - 99 mg/dL Final   Glucose reference range applies only to samples taken after fasting for at least 8 hours.   BUN 10/03/2021 11  6 - 20 mg/dL Final   Creatinine, Ser 10/03/2021 0.88  0.61 - 1.24 mg/dL Final   Calcium 10/03/2021 9.7  8.9 - 10.3 mg/dL Final   GFR, Estimated 10/03/2021 >60  >60 mL/min Final   Comment: (NOTE) Calculated using the CKD-EPI Creatinine Equation (2021)     Anion gap 10/03/2021 9  5 - 15 Final   Performed at [facility], [address] [place], [place] [address]   Lactic Acid, Venous 10/03/2021 1.4  0.5 - 1.9 mmol/L Final   Performed at [facility], [address] [place], [place] [address]   SARS Coronavirus 2 by RT PCR 10/04/2021 NEGATIVE  NEGATIVE Final   Comment: (NOTE) SARS-CoV-2 target nucleic acids are NOT DETECTED.  The SARS-CoV-2 RNA is generally detectable in upper respiratory specimens during the acute phase of infection. The lowest concentration of SARS-CoV-2 viral copies this assay can detect is 138 copies/mL. A negative result does not preclude SARS-Cov-2 infection and should not be used as the sole basis for treatment or other patient management decisions. A negative result may occur with  improper specimen collection/handling, submission of specimen other than nasopharyngeal swab, presence of viral mutation(s) within the areas targeted by this assay, and inadequate number of viral copies(<138 copies/mL). A negative result must be combined with clinical observations, patient history, and epidemiological information. The expected result is Negative.  Fact Sheet for Patients:  EntrepreneurPulse.com.au  Fact Sheet for Healthcare Providers:  IncredibleEmployment.be  This test is no                          t yet approved or cleared by the [place] FDA and  has been authorized for detection and/or diagnosis of SARS-CoV-2 by FDA under an Emergency Use Authorization (EUA). This EUA will remain  in effect (meaning this test can be used) for the duration of the COVID-19 declaration under Section 564(b)(1) of the Act, 21 U.S.C.section 360bbb-3(b)(1), unless the authorization is  terminated  or revoked sooner.       Influenza A by PCR 10/04/2021 NEGATIVE  NEGATIVE Final   Influenza B by PCR 10/04/2021 NEGATIVE  NEGATIVE Final   Comment: (NOTE) The Xpert Xpress  SARS-CoV-2/FLU/RSV plus assay is intended as an aid in the diagnosis of influenza from Nasopharyngeal swab specimens and should not be used as a sole basis for treatment. Nasal washings and aspirates are unacceptable for Xpert Xpress SARS-CoV-2/FLU/RSV testing.  Fact Sheet for Patients: EntrepreneurPulse.com.au  Fact Sheet for Healthcare Providers: IncredibleEmployment.be  This test is not yet approved or cleared by the [place] FDA and has been authorized for detection and/or diagnosis of SARS-CoV-2 by FDA under an Emergency Use Authorization (EUA). This EUA will remain in effect (meaning this test can be used) for the duration of the COVID-19 declaration under Section 564(b)(1) of the Act, 21 U.S.C. section 360bbb-3(b)(1), unless the authorization is terminated or revoked.  Performed at [facility], [address] [place], [place] [address]    Group A Strep by PCR 10/04/2021 NOT DETECTED  NOT DETECTED Final   Performed at [facility], [address] [place], [place] [address]   SARS Coronavirus 2 by RT PCR 10/04/2021 NEGATIVE  NEGATIVE Final   Comment: (NOTE) SARS-CoV-2 target nucleic acids are NOT DETECTED.  The SARS-CoV-2 RNA is generally detectable in upper respiratory specimens during the acute phase of infection. The lowest concentration of SARS-CoV-2 viral copies this assay can detect is 138 copies/mL. A negative result does not preclude SARS-Cov-2 infection and should not be used as the sole basis for treatment or other patient management decisions. A negative result may occur with  improper specimen collection/handling, submission of specimen other than nasopharyngeal swab, presence of viral mutation(s) within the areas targeted by this assay, and inadequate number of viral copies(<138 copies/mL). A negative result must be combined with clinical observations, patient history, and  epidemiological information. The expected result is Negative.  Fact Sheet for Patients:  EntrepreneurPulse.com.au  Fact Sheet for Healthcare Providers:  IncredibleEmployment.be  This test is no                          t yet approved or cleared by the [place] FDA and  has been authorized for detection and/or diagnosis of SARS-CoV-2 by FDA under an Emergency Use Authorization (EUA). This EUA will remain  in effect (meaning this test can be used) for the duration of the COVID-19 declaration under Section 564(b)(1) of the Act, 21 U.S.C.section 360bbb-3(b)(1), unless the authorization is terminated  or revoked sooner.       Influenza A by PCR 10/04/2021 NEGATIVE  NEGATIVE Final   Influenza B by PCR 10/04/2021 NEGATIVE  NEGATIVE Final   Comment: (NOTE) The Xpert Xpress SARS-CoV-2/FLU/RSV plus assay is intended as an aid in the diagnosis of influenza from Nasopharyngeal swab specimens and should not be used as a sole basis for treatment. Nasal washings and aspirates are unacceptable for Xpert Xpress SARS-CoV-2/FLU/RSV testing.  Fact Sheet for Patients: EntrepreneurPulse.com.au  Fact Sheet for Healthcare Providers: IncredibleEmployment.be  This test is not yet approved or cleared by the [place] FDA and has been authorized for detection and/or diagnosis of SARS-CoV-2 by FDA under an Emergency Use Authorization (EUA). This EUA will remain in effect (meaning this test can be used) for the duration of the COVID-19 declaration under Section 564(b)(1) of the Act, 21 U.S.C. section 360bbb-3(b)(1), unless the authorization is terminated or revoked.  Performed at [facility], [address] [place], [place] [address]    WBC 10/04/2021 7.0  4.0 - 10.5 K/uL Final   RBC 10/04/2021 4.62  4.22 - 5.81 MIL/uL Final   Hemoglobin 10/04/2021 15.5  13.0 - 17.0 g/dL Final   HCT 10/04/2021 44.0  39.0 -  52.0 % Final   MCV 10/04/2021 95.2  80.0 - 100.0 fL Final   MCH 10/04/2021 33.5  26.0 - 34.0 pg Final   MCHC 10/04/2021 35.2  30.0 - 36.0 g/dL Final   RDW 10/04/2021 12.5  11.5 - 15.5 % Final   Platelets 10/04/2021 282  150 - 400 K/uL Final   nRBC 10/04/2021 0.0  0.0 - 0.2 % Final   Neutrophils Relative % 10/04/2021 73  % Final   Neutro Abs 10/04/2021 5.2  1.7 - 7.7 K/uL Final   Lymphocytes Relative 10/04/2021 17  % Final   Lymphs Abs 10/04/2021 1.2  0.7 - 4.0 K/uL Final   Monocytes Relative 10/04/2021 7  % Final   Monocytes Absolute 10/04/2021 0.5  0.1 - 1.0 K/uL Final   Eosinophils Relative 10/04/2021 3  % Final   Eosinophils Absolute 10/04/2021 0.2  0.0 - 0.5 K/uL Final   Basophils Relative 10/04/2021 0  % Final   Basophils Absolute 10/04/2021 0.0  0.0 - 0.1 K/uL Final   Immature Granulocytes 10/04/2021 0  % Final   Abs Immature Granulocytes 10/04/2021 0.01  0.00 - 0.07 K/uL Final   Performed at [facility], [address] [place], [place] [address]  Admission on 09/06/2021, Discharged on 09/07/2021  Component Date Value Ref Range Status   Sodium 09/06/2021 132 (L)  135 - 145 mmol/L Final   Potassium 09/06/2021 3.6  3.5 - 5.1 mmol/L Final   Chloride 09/06/2021 97 (L)  98 - 111 mmol/L Final   CO2 09/06/2021 25  22 - 32 mmol/L Final   Glucose, Bld 09/06/2021 114 (H)  70 - 99 mg/dL Final   Glucose reference range applies only to samples taken after fasting for at least 8 hours.   BUN 09/06/2021 9  6 - 20 mg/dL Final   Creatinine, Ser 09/06/2021 0.99  0.61 - 1.24 mg/dL Final   Calcium 09/06/2021 9.9  8.9 - 10.3 mg/dL Final   Total Protein 09/06/2021 6.9  6.5 - 8.1 g/dL Final   Albumin 09/06/2021 4.1  3.5 - 5.0 g/dL Final   AST 09/06/2021 35  15 - 41 U/L Final   ALT 09/06/2021 21  0 - 44 U/L Final   Alkaline Phosphatase 09/06/2021 70  38 - 126 U/L Final   Total Bilirubin 09/06/2021 1.1  0.3 - 1.2 mg/dL Final   GFR, Estimated 09/06/2021 >60  >60 mL/min Final   Comment:  (NOTE) Calculated using the CKD-EPI Creatinine Equation (2021)    Anion gap 09/06/2021 10  5 - 15 Final   Performed at [facility], [address] [place], [place] [address]   Alcohol, Ethyl (B) 09/06/2021 <10  <10 mg/dL Final   Comment: (NOTE) Lowest detectable limit for serum alcohol is 10 mg/dL.  For medical purposes only. Performed at [facility], [address] [place], [place] [address]    Salicylate Lvl 29/56/2130 <7.0 (L)  7.0 - 30.0 mg/dL Final   Performed at [facility], [place], [place] [address]   Acetaminophen (Tylenol), Serum 09/06/2021 <10 (L)  10 - 30 ug/mL Final   Comment: (NOTE) Therapeutic concentrations vary significantly. A range  of 10-30 ug/mL  may be an effective concentration for many patients. However, some  are best treated at concentrations outside of this range. Acetaminophen concentrations >150 ug/mL at 4 hours after ingestion  and >50 ug/mL at 12 hours after ingestion are often associated with  toxic reactions.  Performed at [facility], [address] [place],  [address]    WBC 09/06/2021 9.5  4.0 - 10.5 K/uL Final   RBC 09/06/2021 4.20 (L)  4.22 - 5.81 MIL/uL Final   Hemoglobin 09/06/2021 14.7  13.0 - 17.0 g/dL Final   HCT 09/06/2021 40.0  39.0 - 52.0 % Final   MCV 09/06/2021 95.2  80.0 - 100.0 fL Final   MCH 09/06/2021 35.0 (H)  26.0 - 34.0 pg Final   MCHC 09/06/2021 36.8 (H)  30.0 - 36.0 g/dL Final   RDW 09/06/2021 12.5  11.5 - 15.5 % Final   Platelets 09/06/2021 316  150 - 400 K/uL Final   nRBC 09/06/2021 0.0  0.0 - 0.2 % Final   Performed at [facility] [address] [place],  [address]   Tricyclic, Ur Screen 27/12/5007 NONE DETECTED  NONE DETECTED Final   Amphetamines, Ur Screen 09/06/2021 POSITIVE (A)  NONE DETECTED Final   MDMA (Ecstasy)Ur Screen 09/06/2021 NONE DETECTED  NONE DETECTED Final   Cocaine Metabolite,Ur [place] 09/06/2021 NONE DETECTED  NONE  DETECTED Final   Opiate, Ur Screen 09/06/2021 NONE DETECTED  NONE DETECTED Final   Phencyclidine (PCP) Ur S 09/06/2021 NONE DETECTED  NONE DETECTED Final   Cannabinoid 50 Ng, Ur [address] 09/06/2021 POSITIVE (A)  NONE DETECTED Final   Barbiturates, Ur Screen 09/06/2021 NONE DETECTED  NONE DETECTED Final   Benzodiazepine, Ur Scrn 09/06/2021 NONE DETECTED  NONE DETECTED Final   Methadone Scn, Ur 09/06/2021 NONE DETECTED  NONE DETECTED Final   Comment: (NOTE) Tricyclics + metabolites, urine    Cutoff 1000 ng/mL Amphetamines + metabolites, urine  Cutoff 1000 ng/mL MDMA (Ecstasy), urine              Cutoff 500 ng/mL Cocaine Metabolite, urine          Cutoff 300 ng/mL Opiate + metabolites, urine        Cutoff 300 ng/mL Phencyclidine (PCP), urine         Cutoff 25 ng/mL Cannabinoid, urine                 Cutoff 50 ng/mL Barbiturates + metabolites, urine  Cutoff 200 ng/mL Benzodiazepine, urine              Cutoff 200 ng/mL Methadone, urine                   Cutoff 300 ng/mL  The urine drug screen provides only a preliminary, unconfirmed analytical test result and should not be used for non-medical purposes. Clinical consideration and professional judgment should be applied to any positive drug screen result due to possible interfering substances. A more specific alternate chemical method must be used in order to obtain a confirmed analytical result. Gas chromatography / mass spectrometry (GC/MS) is the preferred confirm                          atory method. Performed at [facility], [place],  [address]    Troponin I (High Sensitivity) 09/06/2021 10  <18 ng/L Final   Comment: (NOTE) Elevated high sensitivity troponin I (hsTnI) values and significant  changes across serial measurements may suggest ACS but many other  chronic and acute conditions are known to elevate hsTnI results.  Refer to the "Links" section for chest pain algorithms and additional   guidance. Performed at [facility], [address] [place], [place] [address]    SARS Coronavirus 2 by RT PCR 09/06/2021 NEGATIVE  NEGATIVE Final   Comment: (NOTE) SARS-CoV-2 target nucleic acids are NOT DETECTED.  The SARS-CoV-2 RNA is generally detectable in upper respiratory specimens during the acute phase of infection. The lowest concentration of SARS-CoV-2 viral copies this assay can detect is 138 copies/mL. A negative result does not preclude SARS-Cov-2 infection and should not be used as the sole basis for treatment or other patient management decisions. A negative result may occur with  improper specimen collection/handling, submission of specimen other than nasopharyngeal swab, presence of viral mutation(s) within the areas targeted by this assay, and inadequate number of viral copies(<138 copies/mL). A negative result must be combined with clinical observations, patient history, and epidemiological information. The expected result is Negative.  Fact Sheet for Patients:  EntrepreneurPulse.com.au  Fact Sheet for Healthcare Providers:  IncredibleEmployment.be  This test is no                          t yet approved or cleared by the [place] FDA and  has been authorized for detection and/or diagnosis of SARS-CoV-2 by FDA under an Emergency Use Authorization (EUA). This EUA will remain  in effect (meaning this test can be used) for the duration of the COVID-19 declaration under Section 564(b)(1) of the Act, 21 U.S.C.section 360bbb-3(b)(1), unless the authorization is terminated  or revoked sooner.       Influenza A by PCR 09/06/2021 NEGATIVE  NEGATIVE Final   Influenza B by PCR 09/06/2021 NEGATIVE  NEGATIVE Final   Comment: (NOTE) The Xpert Xpress SARS-CoV-2/FLU/RSV plus assay is intended as an aid in the diagnosis of influenza from Nasopharyngeal swab specimens and should not be used as a sole basis for  treatment. Nasal washings and aspirates are unacceptable for Xpert Xpress SARS-CoV-2/FLU/RSV testing.  Fact Sheet for Patients: EntrepreneurPulse.com.au  Fact Sheet for Healthcare Providers: IncredibleEmployment.be  This test is not yet approved or cleared by the [place] FDA and has been authorized for detection and/or diagnosis of SARS-CoV-2 by FDA under an Emergency Use Authorization (EUA). This EUA will remain in effect (meaning this test can be used) for the duration of the COVID-19 declaration under Section 564(b)(1) of the Act, 21 U.S.C. section 360bbb-3(b)(1), unless the authorization is terminated or revoked.  Performed at [facility], [address] [place], [place] [address]   Hospital Outpatient Visit on 05/17/2021  Component Date Value Ref Range Status   Glucose-Capillary 05/17/2021 108 (H)  70 - 99 mg/dL Final   Glucose reference range applies only to samples taken after fasting for at least 8 hours.  Appointment on 04/08/2021  Component Date Value Ref Range Status   Magnesium 04/08/2021 2.2  1.7 - 2.4 mg/dL Final   Performed at [facility], [address] [place], [place] [address]   Sodium 04/08/2021 139  135 - 145 mmol/L Final   Potassium 04/08/2021 4.0  3.5 - 5.1 mmol/L Final   Chloride 04/08/2021 104  98 - 111 mmol/L Final   CO2 04/08/2021 28  22 - 32 mmol/L Final   Glucose, Bld 04/08/2021 120 (H)  70 - 99 mg/dL Final   Glucose  reference range applies only to samples taken after fasting for at least 8 hours.   BUN 04/08/2021 16  6 - 20 mg/dL Final   Creatinine, Ser 04/08/2021 1.00  0.61 - 1.24 mg/dL Final   Calcium 04/08/2021 8.6 (L)  8.9 - 10.3 mg/dL Final   GFR, Estimated 04/08/2021 >60  >60 mL/min Final   Comment: (NOTE) Calculated using the CKD-EPI Creatinine Equation (2021)    Anion gap 04/08/2021 7  5 - 15 Final   Performed at [facility], [address] [place], [place] [address]    WBC 04/08/2021 7.3  4.0 - 10.5 K/uL Final   RBC 04/08/2021 3.91 (L)  4.22 - 5.81 MIL/uL Final   Hemoglobin 04/08/2021 13.3  13.0 - 17.0 g/dL Final   HCT 04/08/2021 37.2 (L)  39.0 - 52.0 % Final   MCV 04/08/2021 95.1  80.0 - 100.0 fL Final   MCH 04/08/2021 34.0  26.0 - 34.0 pg Final   MCHC 04/08/2021 35.8  30.0 - 36.0 g/dL Final   RDW 04/08/2021 11.1 (L)  11.5 - 15.5 % Final   Platelets 04/08/2021 260  150 - 400 K/uL Final   nRBC 04/08/2021 0.0  0.0 - 0.2 % Final   Neutrophils Relative % 04/08/2021 70  % Final   Neutro Abs 04/08/2021 5.1  1.7 - 7.7 K/uL Final   Lymphocytes Relative 04/08/2021 21  % Final   Lymphs Abs 04/08/2021 1.5  0.7 - 4.0 K/uL Final   Monocytes Relative 04/08/2021 7  % Final   Monocytes Absolute 04/08/2021 0.5  0.1 - 1.0 K/uL Final   Eosinophils Relative 04/08/2021 2  % Final   Eosinophils Absolute 04/08/2021 0.1  0.0 - 0.5 K/uL Final   Basophils Relative 04/08/2021 0  % Final   Basophils Absolute 04/08/2021 0.0  0.0 - 0.1 K/uL Final   Immature Granulocytes 04/08/2021 0  % Final   Abs Immature Granulocytes 04/08/2021 0.01  0.00 - 0.07 K/uL Final   Performed at [facility], [address] [place], [place] [address]    Blood Alcohol level:  Lab Results  Component Value Date   [facility] <10 10/05/2021   ETH <10 51/88/4166    Metabolic Disorder Labs: Lab Results  Component Value Date   HGBA1C 5.4 10/05/2021   MPG 108.28 10/05/2021   No results found for: PROLACTIN Lab Results  Component Value Date   CHOL 203 (H) 10/05/2021   TRIG 64 10/05/2021   HDL 57 10/05/2021   CHOLHDL 3.6 10/05/2021   VLDL 13 10/05/2021   LDLCALC 133 (H) 10/05/2021    Therapeutic Lab Levels: No results found for: LITHIUM No results found for: VALPROATE No components found for:  CBMZ  Physical Findings   AUDIT    Flowsheet Row Admission (Discharged) from 02/04/2021 in [place]  Alcohol Use Disorder Identification Test Final Score (AUDIT) 18       PHQ2-9    [place] ED from 10/05/2021 in [facility]  PHQ-2 Total Score 3  PHQ-9 Total Score [place] ED from 10/05/2021 in [facility] ED from 10/03/2021 in [place] ED from 09/06/2021 in [place] CATEGORY Moderate Risk No Risk No Risk        Musculoskeletal  Strength & Muscle Tone: within normal limits Gait & Station: normal Patient leans: N/A  Psychiatric Specialty Exam  Presentation  General Appearance: Appropriate for Environment  Eye Contact:Fair  Speech:Clear and Coherent  Speech Volume:Normal  Handedness:Right   Mood and Affect  Mood:Anxious  Affect:Congruent   Thought Process  Thought Processes:Coherent  Descriptions of Associations:Intact  Orientation:Full (Time, Place and Person)  Thought Content:Paranoid Ideation; Delusions  Diagnosis of Schizophrenia or Schizoaffective disorder in past: No  Duration of Psychotic Symptoms: Less than six months   Hallucinations:Hallucinations: None  Ideas of Reference:Paranoia; Delusions  Suicidal Thoughts:Suicidal Thoughts: No  Homicidal Thoughts:Homicidal Thoughts: No   Sensorium  Memory:Immediate Fair; Remote Fair; Recent Fair  Judgment:Impaired  Insight:Lacking   Executive Functions  Concentration:Fair  Attention Span:Fair  [place]   Psychomotor Activity  Psychomotor Activity:Psychomotor Activity: Normal   Assets  Assets:Communication Skills; Desire for Improvement; Leisure Time; Physical Health; Social Support   Sleep  Sleep:Sleep: Fair   Nutritional Assessment (For OBS and FBC admissions only) Has the patient had a weight loss or gain of 10 pounds or more in the last 3 months?: No Has the patient had a decrease in food intake/or appetite?: No Does  the patient have dental problems?: No Does the patient have eating habits or behaviors that may be indicators of an eating disorder including binging or inducing vomiting?: No Has the patient recently lost weight without trying?: 0 Has the patient been eating poorly because of a decreased appetite?: 0 Malnutrition Screening Tool Score: 0    Physical Exam  Physical Exam Constitutional:      Appearance: Normal appearance.  HENT:     Head: Normocephalic and atraumatic.     Nose: Nose normal.  Eyes:     Conjunctiva/sclera: Conjunctivae normal.  Cardiovascular:     Rate and Rhythm: Normal rate.  Pulmonary:     Effort: Pulmonary effort is normal.  Musculoskeletal:        General: Normal range of motion.     Cervical back: Normal range of motion.  Neurological:     Mental Status: He is alert and oriented to person, place, and time.   Review of Systems  Constitutional: Negative.   HENT: Negative.    Eyes: Negative.   Respiratory: Negative.    Cardiovascular: Negative.   Gastrointestinal: Negative.   Genitourinary: Negative.   Musculoskeletal: Negative.   Skin: Negative.   Neurological: Negative.   Endo/Heme/Allergies: Negative.   Blood pressure 121/82, pulse 91, temperature 97.8 F (36.6 C), temperature source Oral, resp. rate 16, SpO2 100 %. There is no height or weight on file to calculate BMI.  Treatment Plan Summary: Patient admitted to [facility] continuous assessment for mood stabilization. 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 It is unclear if the patient presentation is drug induced. Awaiting collateral information from [facility] and [place]. [facility] and be reassessed on Friday, 10/07/21 by psychiatry.   Medications:  -increase Zyprexa from 5 mg to 10 mg QHS for psychosis.  Labs reviewed:  UDS positive for amphetamines, methamphetamines, and marijuana.  BAL less than 10.  EKG-QTC is  438  Gohan Collister L, NP 10/06/2021 12:13 PM

## 2021-10-06 NOTE — Progress Notes (Signed)
CSW contacted by [facility] Newcomer [phone]) and informed that they have no open case with the pt. He stated that in interviewing pt yesterday it seemed that pt had some mental health stuff going on instead. He went on to state that pt had been noted to have claimed that staff at [facility] were calling gang members to come get him. No other concerns expressed. Contact ended without incident.   CSW to updated provider regarding collateral information via secure chat.   Chalmers Guest. Guerry Bruin, MSW, [place], [place] 10/06/2021 2:33 PM

## 2021-10-06 NOTE — Progress Notes (Signed)
CSW attempted to contact [place] [phone]. Unable to establish contact and not able to leave a voicemail.   CSW attempted to contact [place] [phone]. Unable to establish contact but HIPPA compliant voicemail left with contact information for follow up.   Chalmers Guest. Guerry Bruin, MSW, [place], [place] 10/06/2021 12:17 PM

## 2021-10-07 LAB — T4, FREE: Free T4: 0.8 ng/dL (ref 0.61–1.12)

## 2021-10-07 MED ORDER — OLANZAPINE 5 MG PO TABS: 5.0000 mg | ORAL_TABLET | Freq: Every day | ORAL | Status: DC

## 2021-10-07 MED ORDER — OLANZAPINE 5 MG PO TABS: 5.0000 mg | ORAL_TABLET | Freq: Every day | ORAL | 0 refills | Status: DC

## 2021-10-07 NOTE — Progress Notes (Signed)
Patient off of the unit talking with NP at this time.

## 2021-10-07 NOTE — Progress Notes (Signed)
Patient alert and oriented states he talked with  NP and will have to leave [facility] today. Patient given coffee per request.

## 2021-10-07 NOTE — Progress Notes (Signed)
CSW received confirmation from [facility] that they are at capacity.  CSW has reached out to [employer] at spoke with Werner Lean who also confirms that they are at capacity.    CSW has informed NP that patient can not be sent to either location at this time due to no beds being available.  It is recommended that the patient go to the [facility] who is currently operating a [employer] and can provide patient with a safe place during the expected inclement weather.    Assunta Curtis, MSW, LCSW 10/07/2021 12:27 PM

## 2021-10-07 NOTE — Progress Notes (Signed)
Patient DENIED at [facility] due to having [facility].   CSW will update the patient.    Still no word on [employer].  Assunta Curtis, MSW, LCSW 10/07/2021 11:22 AM

## 2021-10-07 NOTE — Progress Notes (Signed)
CSW also spoke with patient for permission to reach out to [employer].  CSW obtained verbal permission from patient.   CSW called [employer] and left HIPAA compliant voicemail requesting return call.  Assunta Curtis, MSW, LCSW 10/07/2021 9:51 AM

## 2021-10-07 NOTE — ED Notes (Signed)
Patient received an After Visit Summary with a list of community resources as well as a paper prescription for Zyprexa. Education provided at discharge. Patient received all belongings from [facility] locker. Patient transported to the [facility] by [facility].

## 2021-10-07 NOTE — Progress Notes (Signed)
CSW received secure chat that the patient would like a referral for [facility] .  CSW called to [facility] and spoke with patietn and obtained verbal permission to send his information for treatment.  Patient reports, however, that he has not used substances in several weeks and "this is not a hallucination".  Patient reports that he is looking to placement in [facility].  He will need shelter resources.  CSW informed that as of yesterday the shelter in [place] is at capacity and pt may need to remain in [facility] where a white flag shelter has been inacted.  Patient stated that he needed to remain in [place] to "address all the things that have been going on".   CSW informed that she will send the information and follow up.  CSW spoke with [facility] who report they only have detox beds available.  They requested clinical information and requested that information be sent to [phone] Attention: Detox.    CSW has faxed the requested material to [facility].  Assunta Curtis, MSW, LCSW 10/07/2021 9:33 AM

## 2021-10-07 NOTE — Progress Notes (Signed)
CSW spoke with the patient regarding the denials and reports that shelter and facilities are at capacity at this time.   CSW spoke with patient about safe discharge planning.  CSW again discussed the [place] order that allows patient to go to shelter due to inclement weather and concerns for low temperatures.  Patient initially stated that he was aligned with this plan.   He continued, however, stating that he wasn't sure of the security of the [facility] and a belief that someone may come to the shelter and kill him before Monday, when he can return to [facility]  Pt stated that he has been seeing the same individuals "wherever I go" and states a belief that these alleged individuals are following him.    CSW provided patient with encouragement and reassurance.    CSW informed that she will provide the NP and clinical staff at [facility] with the address for shelter so transportation can be arranged.  Assunta Curtis, MSW, LCSW 10/07/2021 12:51 PM

## 2021-10-07 NOTE — Discharge Instructions (Addendum)
Discharge recommendations:  Patient is to take medications as prescribed. Please see information for follow-up appointment with psychiatry and therapy. Please follow up with your primary care provider for all medical related needs.  Follow up with your oncologist Dr. Janese Banks, as scheduled on October 25, 2021 at 1:15 pm  Please go to the [facility] for shelter.  Therapy: We recommend that patient participate in individual therapy to address mental health concerns.  Medications: The parent/guardian is to contact a medical professional and/or outpatient provider to address any new side effects that develop. Parent/guardian should update outpatient providers of any new medications and/or medication changes.   Atypical antipsychotics: If you are prescribed an atypical antipsychotic, it is recommended that your height, weight, BMI, blood pressure, fasting lipid panel, and fasting blood sugar be monitored by your outpatient providers.  Safety:  The patient should abstain from use of illicit substances/drugs and abuse of any medications. If symptoms worsen or do not continue to improve or if the patient becomes actively suicidal or homicidal then it is recommended that the patient return to the closest hospital emergency department, the [facility], or call 911 for further evaluation and treatment. National Suicide Prevention Lifeline 1-800-SUICIDE or 539-571-4281.  About 988 988 offers 24/7 access to trained crisis counselors who can help people experiencing mental health-related distress. People can call or text 988 or chat 988lifeline.org for themselves or if they are worried about a loved one who may need crisis support.

## 2021-10-07 NOTE — Progress Notes (Signed)
CSW was informed via Secure Chat by NP Darrol Angel, that patient has changed his mind and is choosing to take the bus to [facility], despite being denied a the requested facilities.    CSW spoke with patient to confirm and review as pt had developed a different plan with CSW.  Pt had reported plans to go to [facility] due to inclement weather concerns.    Pt did confirm that he plans on coming to [facility]. He reports that he has nowhere to stay once he arrives to [place] and is unsure of the numbers to family/friends.   CSW informed that she will staff with [facility] Supervisor Oretha Caprice due to concerns for safety at discharge.   Assunta Curtis, MSW, LCSW 10/07/2021 2:01 PM

## 2021-10-07 NOTE — ED Provider Notes (Signed)
FBC/OBS ASAP Discharge Summary  Date and Time: 10/07/2021 8:54 AM  Name: Paul Hebert  MRN:  [id number]   Discharge Diagnoses:  Final diagnoses:  Substance-induced psychotic disorder (Paul Hebert)  Methamphetamine abuse (Paul Hebert)  History of alcohol use disorder  Cocaine use disorder (Paul Hebert)    Subjective: Paul Hebert is a 50 y.o. male history of polysubstance abuse with substance-induced psychosis and squamous cell carcinoma of the oropharynx who presents voluntarily to [facility] with law enforcement for an evaluation. Patient was seen at [facility] ED on 10/04/21 with a similar presentation. Patient states "I have had a hell of a last 3 weeks."  Patient reports that on Sunday of this week he was at his cousin's house and witnessed a drug transaction. He states someone there realize he witnessed the transaction and since that time they have been trying to kidnap him. He provides a lengthy account of gangs following him around elements of counting in a gray van.  He states, "the bloods and a Spanish gang are trying to kill me." He states that when he was discharged from [facility] ED that someone tried to kidnap him. He states he called the vice squad with the [employer].  He states that the police took him to the [place] truck stop to get him out of [facility] because they were afraid that someone was trying to harm him. He states that the [place] police brought him to [place]. Patient denies suicidal ideations. He denies homicidal ideations. He reports history of alcohol use.  States in the past that he drank 3 (16 ounce) beers daily. He states he has not had any alcohol in 1 to 2 weeks.  He reports daily use of marijuana.  He reports that he has used methamphetamines 10 times in his life. He reports last use of meth on this past Sunday.  He reports a history of using crack/cocaine 2 to 3 days a week. He reports he has not used crack/cocaine in 1 to 2 weeks. UDS positive for amphetamines, methamphetamines,  and marijuana.  BAL less than 10.    Stay Summary: Patient seen and re-evaluated face-to-face by this provider, chart reviewed and case discussed with Dr. Serafina Mitchell. On evaluation, patient is alert and oriented x4. His thought process is is logical with delusional and paranoia thought content. His speech is clear and coherent. His mood is anxious, and affect is congruent.  Patient reports, "you think I am crazy because of my drug history. I am scared.There are gang members trying to kill me." Patient denies auditory and visual hallucinations. There is no evidence that the patient is responding to internal or external stimuli. Patient denies suicidal and homicidal ideations. Patient denies depressive symptoms. Patient reports feeling anxious due to gang members trying to kill him. He reports a fair appetite. He reports fair sleep. He states that the medication he took last night is making him feel woozy this morning. I discussed with the patient decreasing the Zyprexa from 10 mg to 5 mg QHS. Patient agrees to stated plan. Patient has been observed on the unit calm, and cooperative without any disruptive, aggressive, psychotic or self harm behaviors. Patient was started on Zyprexa 5 mg QHS for substance induced psychosis. Zyprexa was increased to 10 mg QHS to reduce paranoia and delusional thought content. I discussed with the patient that methamphetamine use can cause episodes of paranoia and delusions. Patient encouraged to stop using drugs and follow up with substance abuse treatment and outpatient psychiatry. Patient appears hesitant  to treatment and states that he is not experiencing delusions or paranoia, "this is real life."  Patient states that he is homeless. He states that he does not have any family support. He states that he has not been able to get in contact with this brother who lives in [place] in about four weeks. He states that he cannot be around his fiancee legally for a couple weeks. I discussed  with the patient going to the [facility] for shelter. Patient declines. He is now interested in going to residential treatment services in [place]. He states, "I need to go back to [place] so I can get things straightened out. I don't know may way around [facility]." Provider consulted with Herschel Senegal, CSW., for a referral to the RTS in [place] and [employer]. Patient shows insight into wanting to return back to [place] where all his personal belongings are. He states that she shelters in [facility] claim to be full when they're not. He states that he will take the [facility] bus to [place]. After speaking with the patient at length, patient now agrees to go to the local shelter at the [facility]. Patient will be transported via safe transport.   Per chart review:  10/03/21-[facility] Patient reported drug cartel are after him.  09/06/21 - [facility]  [name] is a 50 y.o. male  with h/o cocaine abuse, polysubstance abuse, here with ams. Pt arrives b/c he reports he has been "doing a lot of drugs." Has not slept in 3 days. Reportedly has been paranoid and hallucinating  per Hedy Camara, CSW., on 10/06/22-[facility] called back and states that they have not active case with pt. he states that when interviewing him yesterday it seemed to be paranoia instead. He states that in the past pt claimed that staff at [facility] were calling gang members to come get him, that everyone was out to get him.   Total Time spent with patient: 15 minutes  Past Psychiatric History: polysubstance abuse with substance-induced psychosis Past Medical History:  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([place])     Past Surgical History:  Procedure Laterality Date   PORT-A-CATH REMOVAL N/A 02/04/2021   Procedure: REMOVAL PORT-A-CATH;  Surgeon: Olean Ree, MD;  Location: [facility] ORS;  Service: General;  Laterality: N/A;   PORTA CATH INSERTION N/A 10/25/2020   Procedure: PORTA CATH INSERTION;  Surgeon: Algernon Huxley,  MD;  Location: [facility];  Service: Cardiovascular;  Laterality: N/A;   REMOVAL OF GASTROSTOMY TUBE N/A 02/04/2021   Procedure: REMOVAL OF GASTROSTOMY TUBE;  Surgeon: Olean Ree, MD;  Location: [facility] ORS;  Service: General;  Laterality: N/A;   Family History:  Family History  Problem Relation Age of Onset   Cancer Father    Family Psychiatric History: No hx reported  Social History:  Social History   Substance and Sexual Activity  Alcohol Use Yes   Comment: 12  week     Social History   Substance and Sexual Activity  Drug Use Yes   Types: Marijuana, "Crack" cocaine, Methamphetamines   Comment: 2 months last time usine    Social History   Socioeconomic History   Marital status: Single    Spouse name: Not on file   Number of children: Not on file   Years of education: Not on file   Highest education level: Not on file  Occupational History   Not on file  Tobacco Use   Smoking status: Every Day  Packs/day: 1.00    Types: Cigarettes   Smokeless tobacco: Current   Tobacco comments:    Patches very rarely  Vaping Use   Vaping Use: Never used  Substance and Sexual Activity   Alcohol use: Yes    Comment: 12  week   Drug use: Yes    Types: Marijuana, "Crack" cocaine, Methamphetamines    Comment: 2 months last time usine   Sexual activity: Not Currently  Other Topics Concern   Not on file  Social History Narrative   Not on file   Social Determinants of Health   Financial Resource Strain: Not on file  Food Insecurity: Not on file  Transportation Needs: Not on file  Physical Activity: Not on file  Stress: Not on file  Social Connections: Not on file   SDOH:  SDOH Screenings   Alcohol Screen: Medium Risk   Last Alcohol Screening Score (AUDIT): 18  Depression (PHQ2-9): Medium Risk   PHQ-2 Score: 10  Financial Resource Strain: Not on file  Food Insecurity: Not on file  Housing: Not on file  Physical Activity: Not on file  Social Connections:  Not on file  Stress: Not on file  Tobacco Use: High Risk   Smoking Tobacco Use: Every Day   Smokeless Tobacco Use: Current   Passive Exposure: Not on file  Transportation Needs: Not on file    Tobacco Cessation:  Prescription not provided because: declined   Current Medications:  Current Facility-Administered Medications  Medication Dose Route Frequency Provider Last Rate Last Admin   acetaminophen (TYLENOL) tablet 650 mg  650 mg Oral Q6H PRN Rozetta Nunnery, NP   650 mg at 10/06/21 1801   alum & mag hydroxide-simeth (MAALOX/MYLANTA) 200-200-20 MG/5ML suspension 30 mL  30 mL Oral Q4H PRN Rozetta Nunnery, NP       hydrOXYzine (ATARAX) tablet 25 mg  25 mg Oral TID PRN Lindon Romp A, NP       magnesium hydroxide (MILK OF MAGNESIA) suspension 30 mL  30 mL Oral Daily PRN Rozetta Nunnery, NP       OLANZapine (ZYPREXA) tablet 10 mg  10 mg Oral QHS Cythina Mickelsen L, NP   10 mg at 10/06/21 2109   traZODone (DESYREL) tablet 50 mg  50 mg Oral QHS PRN Rozetta Nunnery, NP       Current Outpatient Medications  Medication Sig Dispense Refill   hydrocortisone cream 1 % Apply 1 application topically 2 (two) times daily as needed (Apply to affected areas on face.).     Multiple Vitamins-Minerals (ADULT GUMMY PO) Take 2 tablets by mouth daily.      PTA Medications: (Not in a hospital admission)   Musculoskeletal  Strength & Muscle Tone: within normal limits Gait & Station: normal Patient leans: N/A  Psychiatric Specialty Exam  Presentation  General Appearance: Appropriate for Environment  Eye Contact:Fair  Speech:Clear and Coherent  Speech Volume:Normal  Handedness:Right   Mood and Affect  Mood:Anxious  Affect:Congruent   Thought Process  Thought Processes:Coherent  Descriptions of Associations:Intact  Orientation:Full (Time, Place and Person)  Thought Content:Paranoid Ideation; Delusions  Diagnosis of Schizophrenia or Schizoaffective disorder in past: No  Duration of  Psychotic Symptoms: Less than six months   Hallucinations:Hallucinations: None  Ideas of Reference:None  Suicidal Thoughts:Suicidal Thoughts: No  Homicidal Thoughts:Homicidal Thoughts: No   Sensorium  Memory:Immediate Fair; Recent Fair; Remote Fair  Judgment:Intact  Insight:Present   Executive Functions  Concentration:Fair  Attention Span:Fair  [place] of  Knowledge:Fair  Language:Fair   Psychomotor Activity  Psychomotor Activity:Psychomotor Activity: Normal   Assets  Assets:Communication Skills; Desire for Improvement; Leisure Time; Physical Health; Social Support   Sleep  Sleep:Sleep: Fair   No data recorded  Physical Exam  Physical Exam Constitutional:      Appearance: Normal appearance.  HENT:     Head: Normocephalic and atraumatic.     Nose: Nose normal.  Eyes:     Conjunctiva/sclera: Conjunctivae normal.  Cardiovascular:     Rate and Rhythm: Normal rate.  Pulmonary:     Effort: Pulmonary effort is normal.  Musculoskeletal:        General: Normal range of motion.     Cervical back: Normal range of motion.  Neurological:     Mental Status: He is alert and oriented to person, place, and time.   Review of Systems  Constitutional: Negative.   HENT: Negative.    Eyes: Negative.   Respiratory: Negative.    Cardiovascular: Negative.   Gastrointestinal: Negative.   Genitourinary: Negative.   Musculoskeletal: Negative.   Skin: Negative.   Neurological: Negative.   Endo/Heme/Allergies: Negative.   Blood pressure 130/88, pulse 91, temperature 98.1 F (36.7 C), temperature source Oral, resp. rate 18, SpO2 100 %. There is no height or weight on file to calculate BMI.  Demographic Factors:  Male, Caucasian, Low socioeconomic status, Living alone, and Unemployed  Loss Factors: Financial problems/change in socioeconomic status  Historical Factors: Impulsivity  Risk Reduction Factors:   Sense of responsibility to  family  Continued Clinical Symptoms:  Alcohol/Substance Abuse/Dependencies  Cognitive Features That Contribute To Risk:  None    Suicide Risk:  Minimal: No identifiable suicidal ideation.  Patients presenting with no risk factors but with morbid ruminations; may be classified as minimal risk based on the severity of the depressive symptoms  Plan Of Care/Follow-up recommendations:  Activity:  as tolerated Follow up with [facility] in [place] to establish services for substance use.  Follow up with your oncologist Dr. Janese Banks, as scheduled on October 25, 2021, at 1:15 pm  Disposition: discharge to self/home. Patient is homeless.   Medications:  Prescription for Zyprexa 5 mg po QHS x 7 days provided to patient.  Follow up recommendations:   Kimisha Eunice L, NP 10/07/2021, 8:54 AM

## 2021-10-07 NOTE — Progress Notes (Signed)
CSW staffed case with [facility].  CSW remained concerned with current plan for patient to be discharged to [facility].  CSW was concerned for safety of discharge as patient did not have a confirmed placement in [facility].   Following staffing the case further with Darrol Angel, NP, pt has agreed to go to the [facility].  TOC will provide patient with a bus pass.  Disharge plan is for patient to go to the [facility] and provide him with bus pass to get to [facility] at a later date.  Assunta Curtis, MSW, LCSW 10/07/2021 2:37 PM

## 2021-10-08 ENCOUNTER — Emergency Department (HOSPITAL_COMMUNITY)
Admission: EM | Admit: 2021-10-08 | Discharge: 2021-10-10 | Disposition: A | Discharge status: Home / Self Care | Payer: Medicaid Other | Attending: Emergency Medicine | Admitting: Emergency Medicine

## 2021-10-08 ENCOUNTER — Other Ambulatory Visit: Payer: Self-pay

## 2021-10-08 ENCOUNTER — Ambulatory Visit (HOSPITAL_COMMUNITY)
Admission: EM | Admit: 2021-10-08 | Discharge: 2021-10-08 | Disposition: A | Discharge status: Home / Self Care | Payer: Medicaid Other | Attending: Psychiatry | Admitting: Psychiatry

## 2021-10-08 DIAGNOSIS — F22 Delusional disorders: Secondary | ICD-10-CM | POA: Insufficient documentation

## 2021-10-08 DIAGNOSIS — F1910 Other psychoactive substance abuse, uncomplicated: Secondary | ICD-10-CM | POA: Diagnosis present

## 2021-10-08 DIAGNOSIS — F19959 Other psychoactive substance use, unspecified with psychoactive substance-induced psychotic disorder, unspecified: Secondary | ICD-10-CM | POA: Diagnosis present

## 2021-10-08 DIAGNOSIS — R45851 Suicidal ideations: Secondary | ICD-10-CM | POA: Diagnosis not present

## 2021-10-08 DIAGNOSIS — Z79899 Other long term (current) drug therapy: Secondary | ICD-10-CM | POA: Diagnosis not present

## 2021-10-08 DIAGNOSIS — Z85818 Personal history of malignant neoplasm of other sites of lip, oral cavity, and pharynx: Secondary | ICD-10-CM | POA: Insufficient documentation

## 2021-10-08 DIAGNOSIS — Z8521 Personal history of malignant neoplasm of larynx: Secondary | ICD-10-CM | POA: Insufficient documentation

## 2021-10-08 DIAGNOSIS — Z5900 Homelessness unspecified: Secondary | ICD-10-CM | POA: Diagnosis not present

## 2021-10-08 DIAGNOSIS — Z20822 Contact with and (suspected) exposure to covid-19: Secondary | ICD-10-CM | POA: Diagnosis not present

## 2021-10-08 DIAGNOSIS — F17210 Nicotine dependence, cigarettes, uncomplicated: Secondary | ICD-10-CM | POA: Diagnosis not present

## 2021-10-08 DIAGNOSIS — F1590 Other stimulant use, unspecified, uncomplicated: Secondary | ICD-10-CM | POA: Insufficient documentation

## 2021-10-08 DIAGNOSIS — Z1330 Encounter for screening examination for mental health and behavioral disorders, unspecified: Secondary | ICD-10-CM | POA: Diagnosis not present

## 2021-10-08 DIAGNOSIS — F1990 Other psychoactive substance use, unspecified, uncomplicated: Secondary | ICD-10-CM | POA: Insufficient documentation

## 2021-10-08 DIAGNOSIS — F1490 Cocaine use, unspecified, uncomplicated: Secondary | ICD-10-CM | POA: Insufficient documentation

## 2021-10-08 MED ORDER — OLANZAPINE 5 MG PO TABS: 5.0000 mg | ORAL_TABLET | Freq: Every day | ORAL | 0 refills | Status: DC

## 2021-10-08 NOTE — ED Provider Notes (Signed)
Emergency Medicine Provider Triage Evaluation Note  Paul Hebert , a 50 y.o. male  was evaluated in triage.  Pt complains of paranoia and suicidal ideations.  Patient states that he is so paranoid he feels like he is about to kill himself.  He states that "gang members are out to kill me."  He states that he states the gang members "everywhere and that is why he is running."  He states that it is gotten to the point that he "wants to end it."  He has no plan.  He denies homicidal ideations.  Endorses AVH.  Endorses tobacco, cocaine and methamphetamine use.  Last used 5 days ago.  He states he has not used alcohol in a long time.  He denies any psychiatric diagnoses.  Does not take psychiatric medication.  On chart review patient was seen at [facility] today and discharged at 72.  At that time patient was stating that "people at the [facility] are trying to kill me."  Patient did not meet inpatient criteria at that time.  It appears that the NP at [facility] offered the patient a bus pass to the [facility].  He wanted to go stay with a friend in [facility] however does not have transportation there.  This is why they offered him the bus pass back to [facility].  At this visit he was denying SI/HI/AVH.  Review of Systems  Positive: See above Negative:   Physical Exam  BP (!) 129/95 (BP Location: Right Arm)    Pulse (!) 101    Temp 98.3 F (36.8 C) (Oral)    Resp 20    SpO2 97%  Gen:   Awake, in moderate distress Resp:  Normal effort  MSK:   Moves extremities without difficulty  Other:    Medical Decision Making  Medically screening exam initiated at 7:17 PM.  Appropriate orders placed.  TAYJON HALLADAY was informed that the remainder of the evaluation will be completed by another provider, this initial triage assessment does not replace that evaluation, and the importance of remaining in the ED until their evaluation is complete.  Patient was just seen at [facility] about 1.5 hours ago.  Not currently taking any medication.  He  does appear to be in moderate distress.  We will medically clear him at this time and have him seen by TTS.  Placed in psych hold with suicidal precautions.   Mickie Hillier, PA-C 10/08/21 1930    Wyvonnia Dusky, MD 10/08/21 [phone]

## 2021-10-08 NOTE — Progress Notes (Signed)
Patient presents with ongoing anxiety and paranoia stating "gangs are out to get him." Patient denies any recent SA issues although per notes tested 5 days ago positive for amphetamines, cocaine and THC. Patient denies any S/I, H/I or AVH. Patient denies any thoughts to self harm and cannot elaborate on why individuals are "out to get him." Patient is requesting assistance with housing since he feels he cannot return to the [facility] where he has been staying. Patient states he is currently homeless. This Probation officer and Chana Bode NP assisted in brainstorming ideas to assist with housing for the night and also discussed managing his mental health symptoms. Patient was provided with an intensive list of resources and transportation back to the [employer]. Patient contracted for safety and denied any thoughts to harm himself or others. Patient was encouraged to follow up with resources provided.

## 2021-10-08 NOTE — ED Triage Notes (Signed)
Pt reports experiencing auditory hallucinations that are threatening him and states he feels suicidal because he "just wants to do anything to make it end". States he is paranoid because he feels that people are out to murder him.

## 2021-10-08 NOTE — ED Notes (Signed)
Patients belongings stored in [place] [address] and Valuables with security

## 2021-10-08 NOTE — BH Assessment (Signed)
Clinician messaged Flint Melter, RN: "Hey. It's Lytle Michaels with TTS is the pt able to be assessed? Is the pt under IVC?"   Clinician awaiting response.    Vertell Novak, [place], [facility], [facility] Triage Specialist [phone]

## 2021-10-08 NOTE — ED Notes (Addendum)
First pt contact at this time. Pt mild-moderate distress on face. A/ox4, pt states he feels extremely paranoid that people are trying to kill him. Pt states he witnessed a drug deal and a recording device was found and that these people think it was him. Pt denies actual auditory or visual hallucinations but does state he will kill himself just to stop feeling paranoid that he will be murdered. Pt denies drugs or ETOH, any HX of mental illness or taking medications. Pt in purple scrubs, belongings collected and inventoried. Pt placed sort of in view of nursing station.

## 2021-10-08 NOTE — Discharge Instructions (Addendum)

## 2021-10-08 NOTE — ED Provider Notes (Signed)
Behavioral Health Urgent Care Medical Screening Exam  Patient Name: Paul Hebert MRN: [id number] Date of Evaluation: 10/08/21 Chief Complaint:   Diagnosis:  Final diagnoses:  Paranoid delusion ([place])    History of Present illness: Paul Hebert is a 50 y.o. male patient presented to [facility] as a walk in voluntarily  with complaints of "those people at [facility] are trying to kill me".  Paul Hebert, 50 y.o., male patient seen face to face by this provider, consulted with Dr. Serafina Mitchell; and chart reviewed on 10/08/21.  Per chart review patient has a history of polysubstance abuse with substance-induced psychosis and squamous cell carcinoma of the oropharynx. He was discharged from [facility] on 12/23.  He was discharged with a similar presentation as today.  He was provided a prescription of Zyprexa 5 mg po nightly.  Reports he lost all of his paperwork including prescription once a was discharged.  He was given a safe transport ride to [facility].  States, "I will not go back to [facility] all the people who are out to get me are there.  Patient states he would feel safe if he could get a bed here at the [facility]   During evaluation Paul Hebert is in sitting in no acute distress. He makes fair eye contact. He is alert/oriented x4. He is anxious with a congruent affect. He is speaking in a clear tone at moderate volume, and normal pace.  He does not appear currently responding to internal/external stimuli. Denies SI/HI/AVH.  He contracts for safety.  Denies any access to firearms/weapons.  He is paranoid with a delusion that he witnessed a crime between gangs and now they are out to get him. He presented with same presentation on 12/23.  States he is not going to feel safe until he talks to the [employer]".  Initially he denies depression, but then states if the people do not stop following him he will become depressed. Reports his sleep and appetite are "ok". He denies any recent substance use.   States he has been clean for 6 days.  He has a history of methamphetamine, amphetamine, cocaine and alcohol abuse.  Discussed that he does not meet inpatient psychiatric admission. He states he wants a safe place to stay until Monday, then he can go back to [facility] where he does feel safe. States he is not allowed to live with his sister. He has a friend that he can live with in [facility] and requested transportation. Explained to patient I could offer him a part bus bas or [place] bus pass, but neither will take him to [facility]. Encouraged patient to take buss pass back to [facility] for shelter. Discussed temperatures are below freezing, and he should seek shelter with them.  At this time Paul Hebert is educated and verbalizes understanding of mental health resources and other crisis services in the community. He is instructed to call 911 and present to the nearest emergency room should he experience any suicidal/homicidal ideation, auditory/visual/hallucinations, or detrimental worsening of his mental health condition.       Psychiatric Specialty Exam  Presentation  General Appearance:Appropriate for Environment; Casual  Eye Contact:Fair  Speech:Clear and Coherent  Speech Volume:Normal  Handedness:Right   Mood and Affect  Mood:Anxious  Affect:Congruent   Thought Process  Thought Processes:Coherent  Descriptions of Associations:Intact  Orientation:Full (Time, Place and Person)  Thought Content:Paranoid Ideation  Diagnosis of Schizophrenia or Schizoaffective disorder in past: No  Duration of Psychotic Symptoms: Less than six months  Hallucinations:None none none  Ideas of Reference:None  Suicidal Thoughts:No  Homicidal Thoughts:No   Sensorium  Memory:Immediate Fair; Recent Fair; Remote Fair  Judgment:Intact  Insight:Fair   Executive Functions  Concentration:Good  Attention Span:Good  [place]  Language:Good   Psychomotor Activity  Psychomotor Activity:Normal   Assets  Assets:Communication Skills; Desire for Improvement; Physical Health; Resilience   Sleep  Sleep:Fair  Number of hours: 6   No data recorded  Physical Exam: Physical Exam Vitals and nursing note reviewed.  Constitutional:      General: He is not in acute distress.    Appearance: Normal appearance. He is well-developed.  HENT:     Head: Normocephalic and atraumatic.  Eyes:     General:        Right eye: No discharge.        Left eye: No discharge.     Conjunctiva/sclera: Conjunctivae normal.  Cardiovascular:     Rate and Rhythm: Normal rate and regular rhythm.     Heart sounds: No murmur heard. Pulmonary:     Effort: Pulmonary effort is normal. No respiratory distress.     Breath sounds: Normal breath sounds.  Musculoskeletal:        General: No swelling. Normal range of motion.     Cervical back: Neck supple.  Skin:    Capillary Refill: Capillary refill takes less than 2 seconds.     Coloration: Skin is not jaundiced or pale.  Neurological:     Mental Status: He is alert and oriented to person, place, and time.  Psychiatric:        Attention and Perception: Attention and perception normal.        Mood and Affect: Mood is anxious.        Behavior: Behavior is cooperative.        Thought Content: Thought content is paranoid and delusional.        Cognition and Memory: Cognition normal.        Judgment: Judgment normal.   Review of Systems  Constitutional: Negative.   HENT: Negative.    Eyes: Negative.   Respiratory: Negative.    Cardiovascular: Negative.   Musculoskeletal: Negative.   Skin: Negative.   Neurological: Negative.   Psychiatric/Behavioral:  The patient is nervous/anxious.   Blood pressure (!) 164/101, pulse (!) 101, temperature 97.7 F (36.5 C), temperature source Oral, resp. rate 19, SpO2 100 %. There is no height or weight on file to calculate  BMI.  Musculoskeletal: Strength & Muscle Tone: within normal limits Gait & Station: normal Patient leans: N/A   [place] MSE Discharge Disposition for Follow up and Recommendations: Based on my evaluation the patient does not appear to have an emergency medical condition and can be discharged with resources and follow up care in outpatient services for Medication Management  Discharge patient.  He does not meet inpatient psychiatric admission criteria.  Provided printed prescription for 14-day supply of Zyprexa 5 mg p.o. nightly  Encourage patient to return to [facility] for shelter.  He was provided a [place] bus pass.  Discussed with patient if he is a [facility] resident and homeless he can obtain services with outpatient services on the second floor.  Discussed open access walk-in hours.  Discussed if his symptoms were to worsen to return to [place]..  Provided outpatient resources for [facility] health and wellness for primary care needs and prescription refills.  No evidence of  imminent risk to self or others at present.    Patient does not meet criteria for psychiatric inpatient admission. Discussed crisis plan, support from social network, calling 911, coming to the Emergency Department, and calling Suicide Hotline.    Revonda Humphrey, NP 10/08/2021, 6:05 PM

## 2021-10-08 NOTE — ED Notes (Signed)
Patient discharged by  NP provider given After Visit Summary with Bus pass and community resources.

## 2021-10-08 NOTE — ED Provider Notes (Signed)
[place] EMERGENCY DEPARTMENT Provider Note   CSN: [id number] Arrival date & time: 10/08/21  1912     History Chief Complaint  Patient presents with   Psychiatric Evaluation    Paul Hebert is a 50 y.o. male.  Patient presenting for psychiatric evaluation. Per triage APP encounter, "Pt complains of paranoia and suicidal ideations.  Patient states that he is so paranoid he feels like he is about to kill himself.  He states that "gang members are out to kill me."  He states that he states the gang members "everywhere and that is why he is running."  He states that it is gotten to the point that he "wants to end it."  He has no plan.  He denies homicidal ideations.  Endorses AVH.  Endorses tobacco, cocaine and methamphetamine use.  Last used 5 days ago.  He states he has not used alcohol in a long time.  He denies any psychiatric diagnoses.  Does not take psychiatric medication.   On chart review patient was seen at [facility] today and discharged at 9.  At that time patient was stating that "people at the [facility] are trying to kill me."  Patient did not meet inpatient criteria at that time.  It appears that the NP at [facility] offered the patient a bus pass to the [facility].  He wanted to go stay with a friend in [facility] however does not have transportation there.  This is why they offered him the bus pass back to [facility].  At this visit he was denying SI/HI/AVH."  Patient denies additional complaints since initial encounter.  The history is provided by the patient and medical records. No language interpreter was used.      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[facility])     Patient Active Problem List   Diagnosis Date Noted   Psychoactive substance-induced psychosis ([place]) 10/04/2021   Severe recurrent major depression without psychotic features ([place]) 02/05/2021   Cocaine abuse ([place]) 01/28/2021   Alcohol abuse 01/28/2021   Severe major depression, single  episode ([place]) 01/28/2021   Squamous cell carcinoma of oropharynx ([place]) 10/18/2020   Goals of care, counseling/discussion 10/18/2020    Past Surgical History:  Procedure Laterality Date   PORT-A-CATH REMOVAL N/A 02/04/2021   Procedure: REMOVAL PORT-A-CATH;  Surgeon: Olean Ree, MD;  Location: [facility] ORS;  Service: General;  Laterality: N/A;   PORTA CATH INSERTION N/A 10/25/2020   Procedure: PORTA CATH INSERTION;  Surgeon: Algernon Huxley, MD;  Location: [place] CV LAB;  Service: Cardiovascular;  Laterality: N/A;   REMOVAL OF GASTROSTOMY TUBE N/A 02/04/2021   Procedure: REMOVAL OF GASTROSTOMY TUBE;  Surgeon: Olean Ree, MD;  Location: [facility] ORS;  Service: General;  Laterality: N/A;       Family History  Problem Relation Age of Onset   Cancer Father     Social History   Tobacco Use   Smoking status: Every Day    Packs/day: 1.00    Types: Cigarettes   Smokeless tobacco: Current   Tobacco comments:    Patches very rarely  Vaping Use   Vaping Use: Never used  Substance Use Topics   Alcohol use: Yes    Comment: 12  week   Drug use: Yes    Types: Marijuana, "Crack" cocaine, Methamphetamines    Comment: 2 months last time usine    Home Medications Prior to Admission medications   Medication Sig Start Date End Date Taking?  Authorizing Provider  acetaminophen (TYLENOL) 500 MG tablet Take 500-1,000 mg by mouth every 6 (six) hours as needed for mild pain or headache.   Yes [provider]  OLANZapine (ZYPREXA) 5 MG tablet Take 1 tablet (5 mg total) by mouth at bedtime. Patient not taking: Reported on 10/08/2021 10/08/21   Revonda Humphrey, NP    Allergies    Patient has no known allergies.  Review of Systems   Review of Systems Ten systems reviewed and are negative for acute change, except as noted in the HPI.    Physical Exam Updated Vital Signs BP (!) 144/65 (BP Location: Right Arm)    Pulse 87    Temp 98.2 F (36.8 C) (Oral)    Resp 16    Ht 5\' 9"   (1.753 m)    Wt 63.5 kg    SpO2 99%    BMI 20.67 kg/m   Physical Exam Vitals and nursing note reviewed.  Constitutional:      General: He is not in acute distress.    Appearance: He is well-developed. He is not diaphoretic.     Comments: Nontoxic appearing and in NAD  HENT:     Head: Normocephalic and atraumatic.  Eyes:     General: No scleral icterus.    Conjunctiva/sclera: Conjunctivae normal.  Pulmonary:     Effort: Pulmonary effort is normal. No respiratory distress.     Comments: Respirations even and unlabored Musculoskeletal:        General: Normal range of motion.     Cervical back: Normal range of motion.  Skin:    General: Skin is warm and dry.     Coloration: Skin is not pale.     Findings: No erythema or rash.  Neurological:     Mental Status: He is alert and oriented to person, place, and time.     Coordination: Coordination normal.     Comments: Moving all extremities spontaneously.  Psychiatric:        Behavior: Behavior normal.    ED Results / Procedures / Treatments   Labs (all labs ordered are listed, but only abnormal results are displayed) Labs Reviewed  RESP PANEL BY RT-PCR (FLU A&B, COVID) [id number]    EKG EKG Interpretation  Date/Time:  Saturday October 08 2021 19:31:19 EST Ventricular Rate:  95 PR Interval:  160 QRS Duration: 94 QT Interval:  344 QTC Calculation: 432 R Axis:   91 Text Interpretation: Normal sinus rhythm Right atrial enlargement Confirmed by Octaviano Glow [phone]) on 10/08/2021 7:55:52 PM  Radiology No results found.  Procedures Procedures   Medications Ordered in ED Medications - No data to display  ED Course  I have reviewed the triage vital signs and the nursing notes.  Pertinent labs & imaging results that were available during my care of the patient were reviewed by me and considered in my medical decision making (see chart for details).  Clinical Course as of 10/09/21 0410  Sat Oct 08, 2021  2239 TTS has  assessed patient. Leandro Reasoner, NP recommends pt to be observed and reassessed by psychiatry in AM. [KH]    Clinical Course User Index [KH] Beverely Pace   MDM Rules/Calculators/A&P                          50 year old male returning to the emergency department for psychiatric assessment.  He has been medically cleared and evaluated by TTS.  Recommendations are for psychiatry reassessment  in the morning.  Patient has no additional complaints.  Vitals are stable.  Disposition to be determined by oncoming ED provider.     Final Clinical Impression(s) / ED Diagnoses Final diagnoses:  Substance-induced psychotic disorder [facility])    Rx / DC Orders ED Discharge Orders     None        Antonietta Breach, PA-C 10/09/21 [place], Ankit, MD 10/09/21 2337
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
## 2021-10-08 NOTE — ED Notes (Signed)
Pt given sandwich and water.

## 2021-10-09 ENCOUNTER — Encounter (HOSPITAL_COMMUNITY): Payer: Self-pay | Admitting: Registered Nurse

## 2021-10-09 DIAGNOSIS — F19959 Other psychoactive substance use, unspecified with psychoactive substance-induced psychotic disorder, unspecified: Secondary | ICD-10-CM | POA: Insufficient documentation

## 2021-10-09 DIAGNOSIS — F1910 Other psychoactive substance abuse, uncomplicated: Secondary | ICD-10-CM

## 2021-10-09 DIAGNOSIS — Z5900 Homelessness unspecified: Secondary | ICD-10-CM

## 2021-10-09 LAB — RAPID URINE DRUG SCREEN, HOSP PERFORMED
Amphetamines: NOT DETECTED
Barbiturates: NOT DETECTED
Benzodiazepines: NOT DETECTED
Cocaine: NOT DETECTED
Opiates: NOT DETECTED
Tetrahydrocannabinol: POSITIVE — AB

## 2021-10-09 LAB — RESP PANEL BY RT-PCR (FLU A&B, COVID) ARPGX2
Influenza A by PCR: NEGATIVE
Influenza B by PCR: NEGATIVE
SARS Coronavirus 2 by RT PCR: NEGATIVE

## 2021-10-09 MED ORDER — ACETAMINOPHEN 325 MG PO TABS
650.0000 mg | ORAL_TABLET | Freq: Once | ORAL | Status: AC
  Administered 2021-10-09: 20:00:00 650 mg via ORAL
  Filled 2021-10-09: qty 2

## 2021-10-09 NOTE — ED Notes (Signed)
Pt ambulated to BR with steady gait. Back to bed without incident.

## 2021-10-09 NOTE — Progress Notes (Signed)
Per Cassandra, Admissions, pt has been accepted to [facility]. Accepting provider is Dr. Kathlene Cote. Patient can arrive 10/10/2021 after 9:00am. Number for report is [phone].   Glennie Isle, MSW, LCSW-A Phone: [phone] Disposition/TOC

## 2021-10-09 NOTE — ED Notes (Signed)
Pt used phone to contact family member

## 2021-10-09 NOTE — ED Notes (Signed)
Pt provided beverage ?

## 2021-10-09 NOTE — Progress Notes (Signed)
CSW was contacted by [facility] in reference to this patient to be reviewed for possible placement. Clarise Cruz with admissions advised that she will further review the patient with the provider.   Glennie Isle, MSW, [place], LCAS-A Phone: [phone] Disposition/TOC

## 2021-10-09 NOTE — Progress Notes (Signed)
Per Shuvon Rankin,NP, patient meets criteria for inpatient treatment. There are no available or appropriate beds at [facility] today. CSW faxed referrals to the following facilities for review:  [facility]  Pending - No Request Sent N/A [address], [place] [address] [phone] --  [place] N/A [address], [place] [address] [phone] --  [facility]  Pending - No Request Sent N/A [address] [place] [address] [phone] --  [place]  Pending - No Request Sent N/A [address] [place] [address] [phone] --  [facility]  Pending - No Request Sent N/A [address] [place], [place] [address] [phone] --  [facility]  Pending - No Request Sent N/A [address] [place] [address] [place] --  [facility]  Pending - No Request Sent N/A [address], [place] [address] [facility]  Pending - No Request Sent N/A [address] [place] [address] [phone] --  [facility]  Pending - No Request Sent N/A [place] [address] [phone] --  [place]  Pending - No Request Sent N/A [address], [place] [address] [phone] --  [facility]  Pending - No Request Sent N/A [place], [place] [address] [place] --  [place]  Pending - No Request Sent N/A [address] [place] [address] [phone] --    TTS will continue to seek bed placement.  Glennie Isle, MSW, [place], LCAS-A Phone: [phone] Disposition/TOC

## 2021-10-09 NOTE — ED Notes (Signed)
Pt completing TTS session

## 2021-10-09 NOTE — Consult Note (Signed)
Telepsych Consultation   Reason for Consult:  Suicidal ideation Referring Physician:  Bing Matter Location of Patient: [facility] ED Location of Provider: Other: [facility]  Patient Identification: Paul Hebert MRN:  [id number] Principal Diagnosis: <principal problem not specified> Diagnosis:  Active Problems:   Psychoactive substance-induced psychosis ([place])   Homeless   Polysubstance abuse ([place])   Total Time spent with patient: 30 minutes  Subjective:   Paul Hebert is a 50 y.o. male patient admitted to [facility] ED after present with complaints of paranoia and suicidal ideation. Patient had just been seen at [facility] and discharged.  Patient has been seen in ED at lest 4 time or more in the last 2 weeks with similar presentations of being followed by a gang, gang trying to kidnap him, was witnessed to a drug deal and not gang trying to kidnap him.    HPI:  Paul Hebert, 50 y.o., male patient seen via tele health by this provider, consulted with Dr. Ernie Hew; and chart reviewed on 10/09/21.  On evaluation Paul Hebert report she came to the hospital because he was having thoughts of killing hisself.  Patient has no specific plan but states "I will do what ever I have to, to kill myself before they can."  Patient denies auditory hallucinations but states that people are really after him and that he was told by law enforcement in [place]  to get out of [facility] until things settle down because he feared for  "my safety."  Patient endorses illicite drug use but states it has been 5-6 days since last use.  States "Because of my drug use everybody thinks I'm lying but I'm not.  A gang is really out to get me."  Patient is unable to contract for safety.  Patient also states that he is homeless and "I was inpatient at [facility] for about 58 days around April or May."  States he wasn't given any referrers for medication management  or therapy to follow up with once he was discharged.   Patient states he is homeless but employed.  States last worked was 2 weeks ago.   During evaluation Paul Hebert is sitting in chair no acute distress.  He is alert, oriented x 4, calm and cooperative.  His mood is depressed and tearful with labile affect.  He does not appear to be responding to internal/external stimuli or delusional thoughts.  Patient denies suicidal/self-harm/homicidal ideation, psychosis, and paranoia.  Patient answered question appropriately.  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)     Past Surgical History:  Procedure Laterality Date   PORT-A-CATH REMOVAL N/A 02/04/2021   Procedure: REMOVAL PORT-A-CATH;  Surgeon: Olean Ree, MD;  Location: [facility] ORS;  Service: General;  Laterality: N/A;   PORTA CATH INSERTION N/A 10/25/2020   Procedure: PORTA CATH INSERTION;  Surgeon: Algernon Huxley, MD;  Location: [facility];  Service: Cardiovascular;  Laterality: N/A;   REMOVAL OF GASTROSTOMY TUBE N/A 02/04/2021   Procedure: REMOVAL OF GASTROSTOMY TUBE;  Surgeon: Olean Ree, MD;  Location: [facility] ORS;  Service: General;  Laterality: N/A;   Family History:  Family History  Problem Relation Age of Onset   Cancer Father  Family Psychiatric  History: None reported Social History:  Social History   Substance and Sexual Activity  Alcohol Use Yes   Comment: 12  week     Social History   Substance and Sexual Activity  Drug Use Yes   Types: Marijuana, "Crack" cocaine, Methamphetamines   Comment: 2 months last time usine    Social History   Socioeconomic History   Marital status: Single    Spouse name: Not on file   Number of children: Not on file   Years of education: Not on file   Highest education level: Not on file  Occupational  History   Not on file  Tobacco Use   Smoking status: Every Day    Packs/day: 1.00    Types: Cigarettes   Smokeless tobacco: Current   Tobacco comments:    Patches very rarely  Vaping Use   Vaping Use: Never used  Substance and Sexual Activity   Alcohol use: Yes    Comment: 12  week   Drug use: Yes    Types: Marijuana, "Crack" cocaine, Methamphetamines    Comment: 2 months last time usine   Sexual activity: Not Currently  Other Topics Concern   Not on file  Social History Narrative   Not on file   Social Determinants of Health   Financial Resource Strain: Not on file  Food Insecurity: Not on file  Transportation Needs: Not on file  Physical Activity: Not on file  Stress: Not on file  Social Connections: Not on file   Additional Social History:    Allergies:  No Known Allergies  Labs:  Results for orders placed or performed during the hospital encounter of 10/08/21 (from the past 48 hour(s))  Resp Panel by RT-PCR (Flu A&B, Covid) Nasopharyngeal Swab     Status: None   Collection Time: 10/08/21 10:38 PM   Specimen: Nasopharyngeal Swab; Nasopharyngeal(NP) swabs in vial transport medium  Result Value Ref Range   SARS Coronavirus 2 by RT PCR NEGATIVE NEGATIVE    Comment: (NOTE) SARS-CoV-2 target nucleic acids are NOT DETECTED.  The SARS-CoV-2 RNA is generally detectable in upper respiratory specimens during the acute phase of infection. The lowest concentration of SARS-CoV-2 viral copies this assay can detect is 138 copies/mL. A negative result does not preclude SARS-Cov-2 infection and should not be used as the sole basis for treatment or other patient management decisions. A negative result may occur with  improper specimen collection/handling, submission of specimen other than nasopharyngeal swab, presence of viral mutation(s) within the areas targeted by this assay, and inadequate number of viral copies(<138 copies/mL). A negative result must be combined  with clinical observations, patient history, and epidemiological information. The expected result is Negative.  Fact Sheet for Patients:  EntrepreneurPulse.com.au  Fact Sheet for Healthcare Providers:  IncredibleEmployment.be  This test is no t yet approved or cleared by the [place] FDA and  has been authorized for detection and/or diagnosis of SARS-CoV-2 by FDA under an Emergency Use Authorization (EUA). This EUA will remain  in effect (meaning this test can be used) for the duration of the COVID-19 declaration under Section 564(b)(1) of the Act, 21 U.S.C.section 360bbb-3(b)(1), unless the authorization is terminated  or revoked sooner.       Influenza A by PCR NEGATIVE NEGATIVE   Influenza B by PCR NEGATIVE NEGATIVE    Comment: (NOTE) The Xpert Xpress SARS-CoV-2/FLU/RSV plus assay is intended as an aid in the diagnosis of influenza from Nasopharyngeal swab specimens and should  not be used as a sole basis for treatment. Nasal washings and aspirates are unacceptable for Xpert Xpress SARS-CoV-2/FLU/RSV testing.  Fact Sheet for Patients: EntrepreneurPulse.com.au  Fact Sheet for Healthcare Providers: IncredibleEmployment.be  This test is not yet approved or cleared by the [place] FDA and has been authorized for detection and/or diagnosis of SARS-CoV-2 by FDA under an Emergency Use Authorization (EUA). This EUA will remain in effect (meaning this test can be used) for the duration of the COVID-19 declaration under Section 564(b)(1) of the Act, 21 U.S.C. section 360bbb-3(b)(1), unless the authorization is terminated or revoked.  Performed at [facility], [place] [address], [place], [place] [address]     Medications:  No current facility-administered medications for this encounter.   Current Outpatient Medications  Medication Sig Dispense Refill   acetaminophen (TYLENOL) 500 MG tablet  Take 500-1,000 mg by mouth every 6 (six) hours as needed for mild pain or headache.     OLANZapine (ZYPREXA) 5 MG tablet Take 1 tablet (5 mg total) by mouth at bedtime. (Patient not taking: Reported on 10/08/2021) 14 tablet 0    Musculoskeletal: Strength & Muscle Tone: within normal limits Gait & Station: normal Patient leans: N/A    Psychiatric Specialty Exam:  Presentation  General Appearance: Appropriate for Environment  Eye Contact:Good  Speech:Clear and Coherent; Normal Rate  Speech Volume:Normal  Handedness:Right   Mood and Affect  Mood:Anxious; Depressed; Labile  Affect:Tearful; Depressed   Thought Process  Thought Processes:Coherent; Goal Directed  Descriptions of Associations:Intact  Orientation:Full (Time, Place and Person)  Thought Content:WDL  History of Schizophrenia/Schizoaffective disorder:No  Duration of Psychotic Symptoms:N/A  Hallucinations:Hallucinations: None  Ideas of Reference:None  Suicidal Thoughts:Suicidal Thoughts: Yes, Active (Stating he will do what ever he has to do to kill himself before the others can) SI Active Intent and/or Plan: With Plan; Without Intent  Homicidal Thoughts:Homicidal Thoughts: No   Sensorium  Memory:Immediate Good; Recent Good  Judgment:Intact  Insight:Fair; Present   Executive Functions  Concentration:Good  Attention Span:Good  [place] of Knowledge:Good  Language:Good   Psychomotor Activity  Psychomotor Activity:Psychomotor Activity: Normal   Assets  Assets:Communication Skills; Desire for Improvement   Sleep  Sleep:Sleep: Fair Number of Hours of Sleep: 6    Physical Exam: Physical Exam Vitals and nursing note reviewed. Exam conducted with a chaperone present.  Constitutional:      General: He is not in acute distress.    Appearance: Normal appearance. He is not ill-appearing.  Cardiovascular:     Rate and Rhythm: Normal rate.  Pulmonary:     Effort: Pulmonary  effort is normal.  Neurological:     Mental Status: He is alert and oriented to person, place, and time.  Psychiatric:        Attention and Perception: Attention and perception normal. He does not perceive visual hallucinations.        Mood and Affect: Mood is anxious and depressed. Affect is labile.        Speech: Speech normal.        Behavior: Behavior is cooperative.        Thought Content: Thought content is paranoid. Thought content includes suicidal ideation. Thought content does not include suicidal plan.        Cognition and Memory: Cognition and memory normal.        Judgment: Judgment is impulsive.   Review of Systems  Constitutional: Negative.   HENT: Negative.    Eyes: Negative.   Respiratory: Negative.  Cardiovascular: Negative.   Gastrointestinal: Negative.   Genitourinary: Negative.   Musculoskeletal: Negative.   Skin: Negative.   Neurological: Negative.   Endo/Heme/Allergies: Negative.   Blood pressure 134/88, pulse 90, temperature 97.8 F (36.6 C), temperature source Oral, resp. rate 14, height 5\' 9"  (1.753 m), weight 63.5 kg, SpO2 99 %. Body mass index is 20.67 kg/m.  Treatment Plan Summary: Daily contact with patient to assess and evaluate symptoms and progress in treatment, Medication management, and Plan Inpatient psychiatric treatment  Disposition: Recommend psychiatric Inpatient admission when medically cleared.  This service was provided via telemedicine using a 2-way, interactive audio and video technology.  Names of all persons participating in this telemedicine service and their role in this encounter. Name: Earleen Newport Role: NP  Name: Dr. Ernie Hew Role: Psychiatrist  Name: Paul Hebert Role: Patient   Name:  Role:     Secure message sent to patients nurse, [facility], and social work:  Psychiatric consult complete and recommended for inpatient psychiatric treatment.  If no bed available at [facility], fax out to surrounding   [place]  Tavaughn Silguero, NP 10/09/2021 1:55 PM

## 2021-10-09 NOTE — Progress Notes (Signed)
CSW received call from [facility] Admissions in reference to reviewing this patient for possible placement. Admissions requested to speak with the patient's nurse. CSW provided contact information to reach the patient's nurse.    Glennie Isle, MSW, [place], LCAS-A Phone: [phone] Disposition/TOC

## 2021-10-09 NOTE — ED Notes (Signed)
Pt eating lunch at the bedside calmly.

## 2021-10-09 NOTE — ED Notes (Signed)
RN spoke with [facility] . Pt needs an [facility] drug screen before 10/10/2021. RN will give report to [phone] 10/10/2021 tomorrow morning. Pt will be going to [facility].

## 2021-10-10 MED ORDER — ACETAMINOPHEN 325 MG PO TABS
650.0000 mg | ORAL_TABLET | Freq: Four times a day (QID) | ORAL | Status: DC | PRN
  Administered 2021-10-10: 08:00:00 650 mg via ORAL
  Filled 2021-10-10: qty 2

## 2021-10-10 NOTE — ED Notes (Signed)
Pt was calm and cooperative throughout the night. Pt did not appear to be responding to internal/external stimuli. Normal rate of speech, conversations appropriate. Up to restroom without assistance.

## 2021-10-10 NOTE — ED Notes (Signed)
Safe transport called to transport patient

## 2021-10-10 NOTE — ED Notes (Signed)
Breakfast orders placed 

## 2021-10-10 NOTE — Progress Notes (Signed)
Patient has been accepted to [facility]. Per Ellin Mayhew, they have requested to postpone transfer until tomorrow due to heating issues within their facility.    Mariea Clonts, MSW, LCSW-A  9:30 AM 10/10/2021

## 2021-10-10 NOTE — ED Notes (Signed)
Pt given personal hygiene products and took a shower. °

## 2021-10-17 ENCOUNTER — Telehealth (HOSPITAL_COMMUNITY): Payer: Self-pay

## 2021-10-17 NOTE — BH Assessment (Signed)
Care Management - Follow Up [facility] Discharges   Patient has been placed in an inpatient psychiatric hospital [facility]).

## 2021-10-24 ENCOUNTER — Ambulatory Visit: Admission: RE | Admit: 2021-10-24 | Payer: Medicaid Other | Source: Ambulatory Visit

## 2021-10-25 ENCOUNTER — Telehealth: Payer: Self-pay | Admitting: Oncology

## 2021-10-25 ENCOUNTER — Inpatient Hospital Stay: Payer: Medicaid Other | Attending: Oncology | Admitting: Oncology

## 2021-10-25 NOTE — Telephone Encounter (Signed)
Attempt made to reach patient-unable to leave a message. Left VM with patient's brother for him to please have patient give [place] a call at the cancer center.

## 2021-11-06 ENCOUNTER — Encounter: Payer: Self-pay | Admitting: Oncology

## 2021-11-07 ENCOUNTER — Telehealth: Payer: Self-pay

## 2021-11-07 ENCOUNTER — Other Ambulatory Visit: Payer: Self-pay

## 2021-11-07 NOTE — Telephone Encounter (Signed)
Ct soft tissue neck and get him seen by ent asap. I will see him after ct and ent visit

## 2021-11-08 ENCOUNTER — Encounter: Payer: Self-pay | Admitting: Oncology

## 2021-11-08 ENCOUNTER — Ambulatory Visit: Admission: RE | Admit: 2021-11-08 | Payer: Medicaid Other | Source: Ambulatory Visit

## 2021-11-08 NOTE — Telephone Encounter (Signed)
error 

## 2021-11-16 ENCOUNTER — Ambulatory Visit: Payer: Medicaid Other

## 2021-11-18 NOTE — Telephone Encounter (Signed)
Paul Hebert spoke to pt today and she set him up with another appt for CT scan and then to see Janese Banks and he already knows about the 2/15 appt with ENT at 10 am

## 2021-11-21 ENCOUNTER — Other Ambulatory Visit: Payer: Self-pay

## 2021-11-21 ENCOUNTER — Ambulatory Visit
Admission: RE | Admit: 2021-11-21 | Discharge: 2021-11-21 | Disposition: A | Discharge status: Home / Self Care | Payer: Medicaid Other | Source: Ambulatory Visit | Attending: Oncology | Admitting: Oncology

## 2021-11-21 DIAGNOSIS — C109 Malignant neoplasm of oropharynx, unspecified: Secondary | ICD-10-CM | POA: Diagnosis not present

## 2021-11-21 MED ORDER — IOHEXOL 300 MG/ML  SOLN
75.0000 mL | Freq: Once | INTRAMUSCULAR | Status: AC | PRN
  Administered 2021-11-21: 75 mL via INTRAVENOUS

## 2021-12-06 ENCOUNTER — Other Ambulatory Visit: Payer: Self-pay

## 2021-12-06 ENCOUNTER — Inpatient Hospital Stay: Payer: Medicaid Other | Attending: Oncology | Admitting: Oncology

## 2021-12-06 ENCOUNTER — Encounter: Payer: Self-pay | Admitting: Oncology

## 2021-12-06 VITALS — BP 115/72 | HR 92 | Temp 97.3°F | Ht 69.0 in | Wt 155.0 lb

## 2021-12-06 DIAGNOSIS — Z79899 Other long term (current) drug therapy: Secondary | ICD-10-CM | POA: Diagnosis not present

## 2021-12-06 DIAGNOSIS — Z08 Encounter for follow-up examination after completed treatment for malignant neoplasm: Secondary | ICD-10-CM

## 2021-12-06 DIAGNOSIS — H9202 Otalgia, left ear: Secondary | ICD-10-CM | POA: Insufficient documentation

## 2021-12-06 DIAGNOSIS — F17210 Nicotine dependence, cigarettes, uncomplicated: Secondary | ICD-10-CM | POA: Diagnosis not present

## 2021-12-06 DIAGNOSIS — Z8589 Personal history of malignant neoplasm of other organs and systems: Secondary | ICD-10-CM

## 2021-12-06 DIAGNOSIS — C01 Malignant neoplasm of base of tongue: Secondary | ICD-10-CM | POA: Insufficient documentation

## 2021-12-06 DIAGNOSIS — Z923 Personal history of irradiation: Secondary | ICD-10-CM | POA: Diagnosis not present

## 2021-12-06 DIAGNOSIS — M542 Cervicalgia: Secondary | ICD-10-CM | POA: Diagnosis not present

## 2021-12-06 NOTE — Progress Notes (Signed)
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 Patient states that he presented with these complaints 6 months ago and had a CT soft tissue neck which did not show any malignancy.  He continued to have on and off pain which was gradually getting worse and he presented back to the ER on 09/27/2020.     CT soft tissue neck showed a left base of tongue mass measuring 3.4 x 2.6 x 3.3 cm which was invading the tongue along the anterior and medial margin and extending into the left vallecula.  Possible superior and posterior extension to involve the left palatine tonsil the lesion abuts the uvula with early extension into the soft palate.  Multiple left level 2 lymph nodes largest of which was 1.2 cm.  Additional necrotic limited to level 3 nodal conglomerate measures 1.3 cm.  No right-sided adenopathy.   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  PET CT scan Showed left base of tongue mass measuring about 2.8 cm with enlarged centrally necrotic level 2/3 left-sided and lymph nodes with an SUV about 3.7.  No hypermetabolic right neck nodes.  No findings of distant metastatic disease   Patient completed 5 cycles of weekly cisplatin chemotherapy between January to March 2022.  There were treatment interruptions due to  mucositis.  He could not complete 7 cycles eventually.  He did complete his radiation treatment.   Towards the end of radiation patient was admitted for suicidal ideation.  Interval history-patient reports worsening pain at the base of his left tongue which radiates to his ear.  It is making it difficult for him to eat and drink.  States that he does not have the money to pay for gabapentin.  Smokes about 2 pack of cigarettes per day.  ECOG PS- 1 Pain scale- 5   Review of systems- Review of Systems  Constitutional:  Negative for chills, fever, malaise/fatigue and weight loss.  HENT:  Negative for congestion, ear discharge and nosebleeds.        Left-sided throat pain and ear pain  Eyes:  Negative for blurred vision.  Respiratory:  Negative for cough, hemoptysis, sputum production, shortness of breath and wheezing.   Cardiovascular:  Negative for chest pain, palpitations, orthopnea and claudication.  Gastrointestinal:  Negative for abdominal pain, blood in stool, constipation, diarrhea, heartburn, melena, nausea and vomiting.  Genitourinary:  Negative for dysuria, flank pain, frequency, hematuria and urgency.  Musculoskeletal:  Negative for back pain, joint pain and myalgias.  Skin:  Negative for rash.  Neurological:  Negative for dizziness, tingling, focal weakness, seizures, weakness and headaches.  Endo/Heme/Allergies:  Does not bruise/bleed easily.  Psychiatric/Behavioral:  Negative for depression and suicidal ideas. The patient does not have insomnia.       No Known Allergies   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([place])      Past  Surgical History:  Procedure Laterality Date   PORT-A-CATH REMOVAL N/A 02/04/2021   Procedure: REMOVAL PORT-A-CATH;  Surgeon: Olean Ree, MD;  Location: [facility] ORS;  Service: General;  Laterality: N/A;   PORTA CATH INSERTION N/A 10/25/2020   Procedure: PORTA CATH INSERTION;  Surgeon: Algernon Huxley, MD;  Location: [place] [facility];  Service: Cardiovascular;  Laterality: N/A;   REMOVAL OF GASTROSTOMY TUBE N/A 02/04/2021   Procedure: REMOVAL OF GASTROSTOMY TUBE;  Surgeon: Olean Ree, MD;  Location: [facility] ORS;  Service: General;  Laterality: N/A;    Social History   Socioeconomic History   Marital status: Single    Spouse name: Not on file   Number of children: Not on file   Years of education: Not on file   Highest education level: Not on file  Occupational History   Not on file  Tobacco Use   Smoking status: Every Day    Packs/day: 1.00    Types: Cigarettes   Smokeless tobacco: Current   Tobacco comments:    Patches very rarely  Vaping Use   Vaping Use: Never used  Substance and Sexual Activity   Alcohol use: Yes    Comment: 12  week   Drug use: Yes    Types: Marijuana, "Crack" cocaine, Methamphetamines    Comment: 2 months last time usine   Sexual activity: Not Currently  Other Topics Concern   Not on file  Social History Narrative   Not on file   Social Determinants of Health   Financial Resource Strain: Not on file  Food Insecurity: Not on file  Transportation Needs: Not on file  Physical Activity: Not on file  Stress: Not on file  Social Connections: Not on file  Intimate Partner Violence: Not on file    Family History  Problem Relation Age of Onset   Cancer Father      Current Outpatient Medications:    acetaminophen (TYLENOL) 500 MG tablet, Take 500-1,000 mg by mouth every 6 (six) hours as needed for mild pain or headache. (Patient not taking: Reported on 12/06/2021), Disp: , Rfl:    hydrOXYzine (VISTARIL) 25 MG capsule, Take 25 mg by mouth every 6 (six) hours as needed. (Patient not taking: Reported on 12/06/2021), Disp: , Rfl:    nicotine (NICODERM CQ - DOSED IN MG/24 HOURS) 21 mg/24hr patch, 21 mg daily. (Patient not taking: Reported on 12/06/2021), Disp: , Rfl:    OLANZapine (ZYPREXA) 5 MG tablet, Take 1 tablet (5 mg total) by mouth at bedtime. (Patient not taking: Reported  on 10/08/2021), Disp: 14 tablet, Rfl: 0   omeprazole (PRILOSEC) 20 MG capsule, Take 20 mg by mouth daily. (Patient not taking: Reported on 12/06/2021), Disp: , Rfl:    prazosin (MINIPRESS) 1 MG capsule, Take 1 mg by mouth at bedtime. (Patient not taking: Reported on 12/06/2021), Disp: , Rfl:    risperiDONE (RISPERDAL) 1 MG tablet, Take 1 mg by mouth 2 (two) times daily. (Patient not taking: Reported on 12/06/2021), Disp: , Rfl:    sertraline (ZOLOFT) 50 MG tablet, Take 50 mg by mouth daily. (Patient not taking: Reported on 12/06/2021), Disp: , Rfl:    traZODone (DESYREL) 100 MG tablet, Take 100 mg by mouth at bedtime as needed. (Patient not taking: Reported on 12/06/2021), Disp: , Rfl:    triamcinolone cream (KENALOG) 0.1 %, Apply topically 2 (two) times daily. (Patient not taking: Reported on 12/06/2021), Disp: , Rfl:   Physical exam:  Vitals:   12/06/21 1007  BP:  115/72  Pulse: 92  Temp: (!) 97.3 F (36.3 C)  TempSrc: Tympanic  SpO2: 97%  Weight: 155 lb (70.3 kg)  Height: 5\' 9"  (1.753 m)   Physical Exam Constitutional:      General: He is not in acute distress. HENT:     Mouth/Throat:     Mouth: Mucous membranes are moist.     Pharynx: Oropharynx is clear.     Comments: Unable to visualize any lesions in the oropharynx or floor of mouth  Cardiovascular:     Rate and Rhythm: Normal rate and regular rhythm.     Heart sounds: Normal heart sounds.  Pulmonary:     Effort: Pulmonary effort is normal.     Breath sounds: Normal breath sounds.  Abdominal:     General: Bowel sounds are normal.     Palpations: Abdomen is soft.  Skin:    General: Skin is warm and dry.  Neurological:     Mental Status: He is alert and oriented to person, place, and time.     CMP Latest Ref Rng & Units 10/05/2021  Glucose 70 - 99 mg/dL 84  BUN 6 - 20 mg/dL 13  Creatinine 0.61 - 1.24 mg/dL 0.96  Sodium 135 - 145 mmol/L 134(L)  Potassium 3.5 - 5.1 mmol/L 4.5  Chloride 98 - 111 mmol/L 95(L)  CO2 22 - 32  mmol/L 28  Calcium 8.9 - 10.3 mg/dL 9.6  Total Protein 6.5 - 8.1 g/dL 6.7  Total Bilirubin 0.3 - 1.2 mg/dL 0.8  Alkaline Phos 38 - 126 U/L 80  AST 15 - 41 U/L 20  ALT 0 - 44 U/L 20   CBC Latest Ref Rng & Units 10/05/2021  WBC 4.0 - 10.5 K/uL 7.4  Hemoglobin 13.0 - 17.0 g/dL 15.3  Hematocrit 39.0 - 52.0 % 42.4  Platelets 150 - 400 K/uL 312    No images are attached to the encounter.  CT SOFT TISSUE NECK W CONTRAST  Result Date: 11/21/2021 CLINICAL DATA:  Squamous cell carcinoma of the oropharynx. EXAM: CT NECK WITH CONTRAST TECHNIQUE: Multidetector CT imaging of the neck was performed using the standard protocol following the bolus administration of intravenous contrast. RADIATION DOSE REDUCTION: This exam was performed according to the departmental dose-optimization program which includes automated exposure control, adjustment of the mA and/or kV according to patient size and/or use of iterative reconstruction technique. CONTRAST:  65mL OMNIPAQUE IOHEXOL 300 MG/ML  SOLN COMPARISON:  09/27/2020 CT neck 05/17/2021 head CT FINDINGS: Pharynx and larynx: Mild asymmetric fullness of the left nasopharynx is unchanged. No discrete mass visualized at the left tongue base. The epiglottis and larynx are normal. Salivary glands: Normal Thyroid: Normal Lymph nodes: None enlarged or abnormal density. Vascular: Negative. Limited intracranial: Negative. Visualized orbits: Negative. Mastoids and visualized paranasal sinuses: Moderate mucosal thickening of the right maxillary sinus is new. Skeleton: No acute or aggressive process. Upper chest: Right apical scarring Other: None IMPRESSION: 1. Unchanged mild asymmetric fullness of the left nasopharynx. No discrete mass visualized at the left tongue base. 2. No cervical lymphadenopathy. 3. New moderate mucosal thickening of the right maxillary sinus. Electronically Signed   By: Ulyses Jarred M.D.   On: 11/21/2021 14:56     Assessment and plan- Patient is a 51 y.o.  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  He is s/p 5 cycles of weekly cisplatin chemotherapy with concurrent radiation treatment.  Chemo was stopped prematurely due  to worsening mucositis and patient completed radiation treatment in April 2022.  He is here for routine follow-up  Patient reports worsening pain in his left neck that radiates to his left ear.  States that it has been difficult for him to eat or drink.  I do recommend continuing gabapentin at this time and we will see if he can give him any financial assistance to pay for the drug.  CT soft tissue neck did not show any evidence of recurrent or progressive disease.  He has asymmetric fullness in the left nasopharynx which was also seen on prior PET scan and was stable as compared to that.  I will reach out to Dr. Tami Ribas to see if he can be seen sooner given his symptoms.  I will see him back in 4 months but potentially sooner if there is any concern for recurrent disease based on ENT exam   Visit Diagnosis 1. Encounter for follow-up surveillance of head and neck cancer      Dr. Randa Evens, MD, MPH [facility] [phone] 12/06/2021 1:14 PM

## 2021-12-07 ENCOUNTER — Other Ambulatory Visit: Payer: Self-pay | Admitting: *Deleted

## 2021-12-07 ENCOUNTER — Encounter: Payer: Self-pay | Admitting: Oncology

## 2021-12-07 ENCOUNTER — Other Ambulatory Visit: Payer: Self-pay

## 2021-12-07 ENCOUNTER — Telehealth: Payer: Self-pay | Admitting: *Deleted

## 2021-12-07 DIAGNOSIS — M542 Cervicalgia: Secondary | ICD-10-CM

## 2021-12-07 DIAGNOSIS — C109 Malignant neoplasm of oropharynx, unspecified: Secondary | ICD-10-CM

## 2021-12-07 MED ORDER — GABAPENTIN 300 MG PO CAPS
300.0000 mg | ORAL_CAPSULE | Freq: Three times a day (TID) | ORAL | 1 refills | Status: DC
  Filled 2021-12-07: qty 90, 30d supply, fill #0

## 2021-12-07 NOTE — Telephone Encounter (Signed)
Pt called to see if he cna get that pain med. And I called him back and this afternoon pt called again. We just got the approval to help him pay for the med. I have sent the gabapentin 300 mg tid and sent it into the [facility] and I had already told him when he was here to see rao where the pharmacy was located and he will go and get it today

## 2021-12-22 ENCOUNTER — Other Ambulatory Visit: Payer: Self-pay

## 2021-12-22 ENCOUNTER — Encounter (HOSPITAL_COMMUNITY)
Admission: RE | Admit: 2021-12-22 | Discharge: 2021-12-22 | Disposition: A | Discharge status: Home / Self Care | Payer: Medicaid Other | Source: Ambulatory Visit | Attending: Oncology | Admitting: Oncology

## 2021-12-22 DIAGNOSIS — M542 Cervicalgia: Secondary | ICD-10-CM | POA: Diagnosis present

## 2021-12-22 DIAGNOSIS — C109 Malignant neoplasm of oropharynx, unspecified: Secondary | ICD-10-CM | POA: Diagnosis present

## 2021-12-22 LAB — GLUCOSE, CAPILLARY: Glucose-Capillary: 82 mg/dL (ref 70–99)

## 2021-12-22 MED ORDER — FLUDEOXYGLUCOSE F - 18 (FDG) INJECTION
17.5000 | Freq: Once | INTRAVENOUS | Status: AC | PRN
  Administered 2021-12-22: 11:00:00 7.5 via INTRAVENOUS

## 2021-12-23 ENCOUNTER — Telehealth: Payer: Self-pay | Admitting: *Deleted

## 2021-12-23 NOTE — Telephone Encounter (Signed)
Patient called asking for doctor to call regarding his PET results, he does not understand what it means and said that there is possible inflammation or disease progression ? ?IMPRESSION: ?1. Mildly asymmetric activity along the left lateral postsulcal ?(posterior) portion of the tongue, without appreciable CT ?abnormality. Maximum SUV in this region is 6.1 on the left compared ?to the contralateral normal right side with maximum SUV 3.6. ?Accordingly, superficial tumor recurrence in this region is not ?completely excluded, correlate with visual inspection of the region. ?2. Chronic paranasal sinusitis. ?3. Aortic Atherosclerosis (ICD10-I70.0) and Emphysema (ICD10-J43.9). ?4. Chronic bilateral pars defects at L5. ?  ?  ?Electronically Signed ?  By: Van Clines M.D. ?  On: 12/22/2021 16:24 ?

## 2021-12-23 NOTE — Telephone Encounter (Signed)
I have reached out to Dr. Tami Ribas. I think he will need to see him first and potential biopsy.

## 2021-12-23 NOTE — Telephone Encounter (Signed)
Contacted pt and is aware of recommendation.  States he will reach out to Dr. Reggy Eye office and schedule an appointment.  ?

## 2021-12-27 ENCOUNTER — Encounter: Payer: Self-pay | Admitting: Oncology

## 2022-01-02 ENCOUNTER — Encounter: Payer: Self-pay | Admitting: Oncology

## 2022-01-02 ENCOUNTER — Telehealth: Payer: Self-pay | Admitting: *Deleted

## 2022-01-02 NOTE — Telephone Encounter (Signed)
error 

## 2022-01-06 ENCOUNTER — Other Ambulatory Visit: Payer: Self-pay | Admitting: Unknown Physician Specialty

## 2022-01-06 DIAGNOSIS — Z85819 Personal history of malignant neoplasm of unspecified site of lip, oral cavity, and pharynx: Secondary | ICD-10-CM

## 2022-01-09 ENCOUNTER — Ambulatory Visit: Payer: Medicaid Other | Admitting: Radiation Oncology

## 2022-01-10 ENCOUNTER — Ambulatory Visit
Admission: RE | Admit: 2022-01-10 | Discharge: 2022-01-10 | Disposition: A | Discharge status: Home / Self Care | Payer: Medicaid Other | Source: Ambulatory Visit | Attending: Unknown Physician Specialty | Admitting: Unknown Physician Specialty

## 2022-01-10 ENCOUNTER — Encounter: Payer: Self-pay | Admitting: Oncology

## 2022-01-10 ENCOUNTER — Other Ambulatory Visit: Payer: Self-pay

## 2022-01-10 DIAGNOSIS — Z85819 Personal history of malignant neoplasm of unspecified site of lip, oral cavity, and pharynx: Secondary | ICD-10-CM

## 2022-01-10 MED ORDER — IOPAMIDOL (ISOVUE-370) INJECTION 76%
75.0000 mL | Freq: Once | INTRAVENOUS | Status: AC | PRN
Start: 2022-01-10 — End: 2022-01-10
  Administered 2022-01-10: 75 mL via INTRAVENOUS

## 2022-01-18 ENCOUNTER — Encounter
Admission: RE | Admit: 2022-01-18 | Discharge: 2022-01-18 | Disposition: A | Discharge status: Home / Self Care | Payer: Medicaid Other | Source: Ambulatory Visit | Attending: Unknown Physician Specialty | Admitting: Unknown Physician Specialty

## 2022-01-18 ENCOUNTER — Other Ambulatory Visit: Payer: Self-pay

## 2022-01-18 DIAGNOSIS — Z01818 Encounter for other preprocedural examination: Secondary | ICD-10-CM

## 2022-01-18 DIAGNOSIS — F1910 Other psychoactive substance abuse, uncomplicated: Secondary | ICD-10-CM

## 2022-01-18 DIAGNOSIS — F1410 Cocaine abuse, uncomplicated: Secondary | ICD-10-CM

## 2022-01-18 HISTORY — DX: Gastro-esophageal reflux disease without esophagitis: K21.9

## 2022-01-18 NOTE — Patient Instructions (Addendum)
Your procedure is scheduled on:01-24-22 Tuesday ?Report to the Registration Desk on the 1st floor of the [facility].Then proceed to the 2nd floor Surgery Desk in the [facility] ?To find out your arrival time, please call [phone] between 1PM - 3PM on:01-23-22 Monday ? ?REMEMBER: ?Instructions that are not followed completely may result in serious medical risk, up to and including death; or upon the discretion of your surgeon and anesthesiologist your surgery may need to be rescheduled. ? ?Do not eat food after midnight the night before surgery.  ?No gum chewing, lozengers or hard candies. ? ?You may however, drink CLEAR liquids up to 2 hours before you are scheduled to arrive for your surgery. Do not drink anything within 2 hours of your scheduled arrival time. ? ?Clear liquids include: ?- water  ?- apple juice without pulp ?- gatorade (not RED colors) ?- black coffee or tea (Do NOT add milk or creamers to the coffee or tea) ?Do NOT drink anything that is not on this list. ? ?Do NOT take any medication the day of surgery ? ?One week prior to surgery: ?Stop Anti-inflammatories (NSAIDS) such as Advil, Aleve, Ibuprofen, Motrin, Naproxen, Naprosyn and Aspirin based products such as Excedrin, Goodys Powder, BC Powder.You may however, take Tylenol if needed for pain up until the day of surgery. ? ?Stop ANY OVER THE COUNTER supplements/vitamins NOW (01-18-22) until after surgery. ? ?No Alcohol for 24 hours before or after surgery. ? ?No Smoking including e-cigarettes for 24 hours prior to surgery.  ?No chewable tobacco products for at least 6 hours prior to surgery.  ?No nicotine patches on the day of surgery. ? ?Do not use any "recreational" drugs for at least a week prior to your surgery.  ?Please be advised that the combination of cocaine and anesthesia may have negative outcomes, up to and including death. ?If you test positive for cocaine, your surgery will be cancelled. ? ?On the morning of surgery brush your  teeth with toothpaste and water, you may rinse your mouth with mouthwash if you wish. ?Do not swallow any toothpaste or mouthwash. ? ?Do not wear jewelry, make-up, hairpins, clips or nail polish. ? ?Do not wear lotions, powders, or perfumes.  ? ?Do not shave body from the neck down 48 hours prior to surgery just in case you cut yourself which could leave a site for infection.  ?Also, freshly shaved skin may become irritated if using the CHG soap. ? ?Contact lenses, hearing aids and dentures may not be worn into surgery. ? ?Do not bring valuables to the hospital. [facility] is not responsible for any missing/lost belongings or valuables.  ? ?Notify your doctor if there is any change in your medical condition (cold, fever, infection). ? ?Wear comfortable clothing (specific to your surgery type) to the hospital. ? ?After surgery, you can help prevent lung complications by doing breathing exercises.  ?Take deep breaths and cough every 1-2 hours. Your doctor may order a device called an Incentive Spirometer to help you take deep breaths. ?When coughing or sneezing, hold a pillow firmly against your incision with both hands. This is called ?splinting.? Doing this helps protect your incision. It also decreases belly discomfort. ? ?If you are being admitted to the hospital overnight, leave your suitcase in the car. ?After surgery it may be brought to your room. ? ?If you are being discharged the day of surgery, you will not be allowed to drive home. ?You will need a responsible adult (18 years or  older) to drive you home and stay with you that night.  ? ?If you are taking public transportation, you will need to have a responsible adult (18 years or older) with you. ?Please confirm with your physician that it is acceptable to use public transportation.  ? ?Please call the [facility]. at [phone] if you have any questions about these instructions. ? ?Surgery Visitation Policy: ? ?Patients undergoing a  surgery or procedure may have two family members or support persons with them as long as the person is not COVID-19 positive or experiencing its symptoms.  ? ? ?

## 2022-01-23 MED ORDER — FAMOTIDINE 20 MG PO TABS: 20.0000 mg | ORAL_TABLET | Freq: Once | ORAL | Status: AC

## 2022-01-23 MED ORDER — ORAL CARE MOUTH RINSE
15.0000 mL | Freq: Once | OROMUCOSAL | Status: AC
Start: 2022-01-23 — End: 2022-01-24

## 2022-01-23 MED ORDER — LACTATED RINGERS IV SOLN: INTRAVENOUS | Status: DC

## 2022-01-23 MED ORDER — CHLORHEXIDINE GLUCONATE 0.12 % MT SOLN: 15.0000 mL | Freq: Once | OROMUCOSAL | Status: AC

## 2022-01-24 ENCOUNTER — Ambulatory Visit
Admission: RE | Admit: 2022-01-24 | Discharge: 2022-01-24 | Disposition: A | Discharge status: Home / Self Care | Payer: Medicaid Other | Attending: Unknown Physician Specialty | Admitting: Unknown Physician Specialty

## 2022-01-24 ENCOUNTER — Encounter: Payer: Self-pay | Admitting: Unknown Physician Specialty

## 2022-01-24 ENCOUNTER — Ambulatory Visit: Payer: Medicaid Other | Admitting: Anesthesiology

## 2022-01-24 ENCOUNTER — Other Ambulatory Visit: Payer: Self-pay

## 2022-01-24 ENCOUNTER — Encounter
Admission: RE | Disposition: A | Discharge status: Home / Self Care | Payer: Self-pay | Source: Home / Self Care | Attending: Unknown Physician Specialty

## 2022-01-24 DIAGNOSIS — F1910 Other psychoactive substance abuse, uncomplicated: Secondary | ICD-10-CM

## 2022-01-24 DIAGNOSIS — Z923 Personal history of irradiation: Secondary | ICD-10-CM | POA: Diagnosis not present

## 2022-01-24 DIAGNOSIS — C01 Malignant neoplasm of base of tongue: Secondary | ICD-10-CM | POA: Diagnosis not present

## 2022-01-24 DIAGNOSIS — J359 Chronic disease of tonsils and adenoids, unspecified: Secondary | ICD-10-CM | POA: Insufficient documentation

## 2022-01-24 DIAGNOSIS — Z9221 Personal history of antineoplastic chemotherapy: Secondary | ICD-10-CM | POA: Diagnosis not present

## 2022-01-24 DIAGNOSIS — Z01818 Encounter for other preprocedural examination: Secondary | ICD-10-CM

## 2022-01-24 DIAGNOSIS — C103 Malignant neoplasm of posterior wall of oropharynx: Secondary | ICD-10-CM | POA: Diagnosis not present

## 2022-01-24 DIAGNOSIS — F1410 Cocaine abuse, uncomplicated: Secondary | ICD-10-CM

## 2022-01-24 DIAGNOSIS — F17200 Nicotine dependence, unspecified, uncomplicated: Secondary | ICD-10-CM | POA: Diagnosis not present

## 2022-01-24 DIAGNOSIS — K148 Other diseases of tongue: Secondary | ICD-10-CM | POA: Diagnosis present

## 2022-01-24 HISTORY — PX: TONGUE BIOPSY: SHX6575

## 2022-01-24 HISTORY — PX: LARYNGOSCOPY: SHX5203

## 2022-01-24 LAB — URINE DRUG SCREEN, QUALITATIVE (ARMC ONLY)
Amphetamines, Ur Screen: NOT DETECTED
Barbiturates, Ur Screen: NOT DETECTED
Benzodiazepine, Ur Scrn: NOT DETECTED
Cannabinoid 50 Ng, Ur ~~LOC~~: POSITIVE — AB
Cocaine Metabolite,Ur ~~LOC~~: NOT DETECTED
MDMA (Ecstasy)Ur Screen: NOT DETECTED
Methadone Scn, Ur: NOT DETECTED
Opiate, Ur Screen: NOT DETECTED
Phencyclidine (PCP) Ur S: NOT DETECTED
Tricyclic, Ur Screen: NOT DETECTED

## 2022-01-24 SURGERY — LARYNGOSCOPY
Anesthesia: General | Laterality: Left

## 2022-01-24 MED ORDER — FENTANYL CITRATE (PF) 100 MCG/2ML IJ SOLN
INTRAMUSCULAR | Status: AC
  Filled 2022-01-24: qty 2

## 2022-01-24 MED ORDER — FENTANYL CITRATE (PF) 100 MCG/2ML IJ SOLN
INTRAMUSCULAR | Status: DC | PRN
Start: 2022-01-24 — End: 2022-01-24
  Administered 2022-01-24 (×2): 50 ug via INTRAVENOUS

## 2022-01-24 MED ORDER — ACETAMINOPHEN 10 MG/ML IV SOLN: 1000.0000 mg | Freq: Once | INTRAVENOUS | Status: DC | PRN

## 2022-01-24 MED ORDER — LIDOCAINE HCL (CARDIAC) PF 100 MG/5ML IV SOSY
PREFILLED_SYRINGE | INTRAVENOUS | Status: DC | PRN
  Administered 2022-01-24: 80 mg via INTRAVENOUS

## 2022-01-24 MED ORDER — OXYCODONE HCL 5 MG/5ML PO SOLN
5.0000 mg | Freq: Once | ORAL | Status: AC | PRN
  Administered 2022-01-24: 5 mg via ORAL

## 2022-01-24 MED ORDER — BUPIVACAINE HCL (PF) 0.5 % IJ SOLN
INTRAMUSCULAR | Status: DC | PRN
  Administered 2022-01-24: 3 mL

## 2022-01-24 MED ORDER — ACETAMINOPHEN 10 MG/ML IV SOLN
INTRAVENOUS | Status: DC | PRN
  Administered 2022-01-24: 1000 mg via INTRAVENOUS

## 2022-01-24 MED ORDER — DEXMEDETOMIDINE (PRECEDEX) IN NS 20 MCG/5ML (4 MCG/ML) IV SYRINGE
PREFILLED_SYRINGE | INTRAVENOUS | Status: DC | PRN
  Administered 2022-01-24: 8 ug via INTRAVENOUS

## 2022-01-24 MED ORDER — ACETAMINOPHEN 10 MG/ML IV SOLN
INTRAVENOUS | Status: AC
  Filled 2022-01-24: qty 100

## 2022-01-24 MED ORDER — BUPIVACAINE-EPINEPHRINE (PF) 0.5% -1:200000 IJ SOLN
INTRAMUSCULAR | Status: AC
  Filled 2022-01-24: qty 30

## 2022-01-24 MED ORDER — MIDAZOLAM HCL 2 MG/2ML IJ SOLN
INTRAMUSCULAR | Status: DC | PRN
  Administered 2022-01-24: 2 mg via INTRAVENOUS

## 2022-01-24 MED ORDER — GLYCOPYRROLATE 0.2 MG/ML IJ SOLN
INTRAMUSCULAR | Status: DC | PRN
  Administered 2022-01-24: .2 mg via INTRAVENOUS

## 2022-01-24 MED ORDER — FENTANYL CITRATE (PF) 100 MCG/2ML IJ SOLN: 25.0000 ug | INTRAMUSCULAR | Status: DC | PRN

## 2022-01-24 MED ORDER — PROPOFOL 10 MG/ML IV BOLUS
INTRAVENOUS | Status: DC | PRN
  Administered 2022-01-24: 150 mg via INTRAVENOUS

## 2022-01-24 MED ORDER — BUPIVACAINE HCL (PF) 0.5 % IJ SOLN
INTRAMUSCULAR | Status: AC
  Filled 2022-01-24: qty 30

## 2022-01-24 MED ORDER — ALBUMIN HUMAN 5 % IV SOLN
INTRAVENOUS | Status: AC
  Filled 2022-01-24: qty 500

## 2022-01-24 MED ORDER — MIDAZOLAM HCL 2 MG/2ML IJ SOLN
INTRAMUSCULAR | Status: AC
  Filled 2022-01-24: qty 2

## 2022-01-24 MED ORDER — OXYCODONE HCL 5 MG PO TABS: 5.0000 mg | ORAL_TABLET | Freq: Once | ORAL | Status: AC | PRN

## 2022-01-24 MED ORDER — SUCCINYLCHOLINE CHLORIDE 200 MG/10ML IV SOSY
PREFILLED_SYRINGE | INTRAVENOUS | Status: DC | PRN
  Administered 2022-01-24: 90 mg via INTRAVENOUS

## 2022-01-24 MED ORDER — ONDANSETRON HCL 4 MG/2ML IJ SOLN
INTRAMUSCULAR | Status: DC | PRN
  Administered 2022-01-24 (×2): 4 mg via INTRAVENOUS

## 2022-01-24 MED ORDER — CHLORHEXIDINE GLUCONATE 0.12 % MT SOLN
OROMUCOSAL | Status: AC
Start: 2022-01-24 — End: 2022-01-24
  Administered 2022-01-24: 15 mL via OROMUCOSAL
  Filled 2022-01-24: qty 15

## 2022-01-24 MED ORDER — ONDANSETRON HCL 4 MG/2ML IJ SOLN: 4.0000 mg | Freq: Once | INTRAMUSCULAR | Status: DC | PRN

## 2022-01-24 MED ORDER — LIDOCAINE-EPINEPHRINE 1 %-1:100000 IJ SOLN
INTRAMUSCULAR | Status: AC
  Filled 2022-01-24: qty 1

## 2022-01-24 MED ORDER — STERILE WATER FOR INJECTION IJ SOLN
INTRAMUSCULAR | Status: AC
  Filled 2022-01-24: qty 10

## 2022-01-24 MED ORDER — 0.9 % SODIUM CHLORIDE (POUR BTL) OPTIME
TOPICAL | Status: DC | PRN
  Administered 2022-01-24: 150 mL

## 2022-01-24 MED ORDER — FAMOTIDINE 20 MG PO TABS
ORAL_TABLET | ORAL | Status: AC
  Administered 2022-01-24: 20 mg via ORAL
  Filled 2022-01-24: qty 1

## 2022-01-24 MED ORDER — DEXAMETHASONE SODIUM PHOSPHATE 10 MG/ML IJ SOLN
INTRAMUSCULAR | Status: DC | PRN
  Administered 2022-01-24: 10 mg via INTRAVENOUS

## 2022-01-24 MED ORDER — OXYCODONE HCL 5 MG/5ML PO SOLN
ORAL | Status: DC
Start: 2022-01-24 — End: 2022-01-24
  Filled 2022-01-24: qty 5

## 2022-01-24 SURGICAL SUPPLY — 22 items
APPLICATOR COTTON TIP 6 STRL (MISCELLANEOUS) ×1 IMPLANT
APPLICATOR COTTON TIP 6IN STRL (MISCELLANEOUS) ×2
BLADE SURG 15 STRL LF DISP TIS (BLADE) ×1 IMPLANT
BLADE SURG 15 STRL SS (BLADE) ×1
COAG SUCT 10F 3.5MM HAND CTRL (MISCELLANEOUS) ×2 IMPLANT
COAGULATOR SUCT 8FR VV (MISCELLANEOUS) ×2 IMPLANT
DRSG TELFA 4X3 1S NADH ST (GAUZE/BANDAGES/DRESSINGS) ×1 IMPLANT
ELECT CAUTERY BLADE 6.4 (BLADE) ×2 IMPLANT
ELECT CAUTERY NDL 2.0 MIC (NEEDLE) ×1 IMPLANT
ELECT CAUTERY NEEDLE 2.0 MIC (NEEDLE) ×2 IMPLANT
ELECT REM PT RETURN 9FT ADLT (ELECTROSURGICAL) ×2
ELECTRODE REM PT RTRN 9FT ADLT (ELECTROSURGICAL) ×1 IMPLANT
GAUZE 4X4 16PLY ~~LOC~~+RFID DBL (SPONGE) ×2 IMPLANT
GLOVE SURG ENC MOIS LTX SZ7.5 (GLOVE) ×2 IMPLANT
GOWN STRL REUS W/ TWL LRG LVL3 (GOWN DISPOSABLE) ×2 IMPLANT
GOWN STRL REUS W/TWL LRG LVL3 (GOWN DISPOSABLE) ×2
MANIFOLD NEPTUNE II (INSTRUMENTS) ×2 IMPLANT
NS IRRIG 500ML POUR BTL (IV SOLUTION) ×2 IMPLANT
PACK HEAD/NECK (MISCELLANEOUS) ×2 IMPLANT
SUT SILK 2 0 SH (SUTURE) ×4 IMPLANT
SUT VIC AB 4-0 RB1 18 (SUTURE) ×2 IMPLANT
WATER STERILE IRR 500ML POUR (IV SOLUTION) ×2 IMPLANT

## 2022-01-24 NOTE — Anesthesia Procedure Notes (Signed)
Procedure Name: Intubation ?Date/Time: 01/24/2022 7:37 AM ?Performed by: Kelton Pillar, CRNA ?Pre-anesthesia Checklist: Patient identified, Emergency Drugs available, Suction available and Patient being monitored ?Patient Re-evaluated:Patient Re-evaluated prior to induction ?Oxygen Delivery Method: Circle system utilized ?Preoxygenation: Pre-oxygenation with 100% oxygen ?Induction Type: IV induction ?Ventilation: Mask ventilation without difficulty ?Laryngoscope Size: McGraph and 3 ?Grade View: Grade I ?Tube type: Oral ?Number of attempts: 1 ?Airway Equipment and Method: Stylet and Oral airway ?Placement Confirmation: ETT inserted through vocal cords under direct vision, positive ETCO2, breath sounds checked- equal and bilateral and CO2 detector ?Secured at: 21 cm ?Tube secured with: Tape ?Dental Injury: Teeth and Oropharynx as per pre-operative assessment  ? ? ? ? ?

## 2022-01-24 NOTE — Op Note (Signed)
01/24/2022 ? ?8:08 AM ? ? ? ?Romir, Klimowicz ? ?[id number] ? ? ?Pre-Op Dx: Left tongue lesion, Left tonsil lesion ? ?Post-op Dx: SAME ? ?Proc: Exam under anesthesia of the oropharynx with endoscopic assisted biopsy left tonsil fossa and left tongue base ? ?Surg:  Roena Malady ? ?Anes:  GOT ? ?EBL: Less than 5 cc ? ?Comp: None ? ?Findings: Approximately 3 x 3 cm ulcerative area involving the left ear inferior aspect of the tonsil and tongue base, with palpation and inspection this appeared to be more fibrous scarring from previous radiation therapy as opposed to ulcerative cancer multiple biopsies were taken. ? ?Procedure: Roma was identified in the holding area taken the operating room placed in supine position.  After general endotracheal anesthesia the table was turned 90 degrees patient was draped in usual fashion for tonsil surgery.  I felt that the best visualization of this area of the inferior tonsil and tongue base would be with a tonsil gag approach, therefore a tonsil gag was inserted and the tongue was moved all the way to the right.  The tongue blade was then extended and this gave excellent visualization of this ulcerative area involving the left inferior tonsil region extending to the tongue base.  This was covered by white sheen which in the clinic appeared to be necrotic debris however with suction this was actually not necrotic debris but rather a firm sclerotic feeling scar consistent with postradiation scarring.  Photodocumentation was performed using the 0 degree endoscope.  The microlaryngeal instruments were then brought into the field.  Using the endoscope multiple biopsies were taken.  They were #1 left inferior tonsil at the edge of the ulcerative area #2 left tongue base at the edge of the ulcerative area #3 the lateral wall of the ulcer #4 the inferior wall of the ulcer #5 the medial wall of the ulcer #6 the central deep area of the ulcer these biopsies were all sent for permanent  section.  The medial aspect bled slightly therefore this was cauterized using the suction cautery.  The endoscope was again used to examine the area with minimal bleeding and all suspicious areas biopsy a local anesthetic 0.5% plain Marcaine was used to inject around the ulcerative area a total of 4 cc was used.  The patient was then awakened in the operating room taken recovery room in stable condition. ? ?Dispo:   Good ? ?Plan: Discharged home follow-up 2-weeks ? ?Roena Malady ? ?01/24/2022 ?8:08 AM ?  ?

## 2022-01-24 NOTE — Anesthesia Postprocedure Evaluation (Signed)
Anesthesia Post Note ? ?Patient: Paul Hebert ? ?Procedure(s) Performed: DIRECT LARYNGOSCOPY WITH TONSIL BIOPSY (Left) ?TONGUE BIOPSY (Left) ? ?Patient location during evaluation: PACU ?Anesthesia Type: General ?Level of consciousness: awake and alert ?Pain management: pain level controlled ?Vital Signs Assessment: post-procedure vital signs reviewed and stable ?Respiratory status: spontaneous breathing, nonlabored ventilation, respiratory function stable and patient connected to nasal cannula oxygen ?Cardiovascular status: blood pressure returned to baseline and stable ?Postop Assessment: no apparent nausea or vomiting ?Anesthetic complications: no ? ? ?No notable events documented. ? ? ?Last Vitals:  ?Vitals:  ? 01/24/22 0845 01/24/22 0854  ?BP: 103/79 (!) 126/96  ?Pulse: 73 72  ?Resp: 16 16  ?Temp: 36.7 ?C (!) 36.2 ?C  ?SpO2: 98% 100%  ?  ?Last Pain:  ?Vitals:  ? 01/24/22 0854  ?TempSrc: Temporal  ?PainSc: 1   ? ? ?  ?  ?  ?  ?  ?  ? ?Arita Miss ? ? ? ? ?

## 2022-01-24 NOTE — Anesthesia Preprocedure Evaluation (Signed)
Anesthesia Evaluation  ?Patient identified by MRN, date of birth, ID band ?Patient awake ? ?General Assessment Comment: ?Hx prior ENT cancer s/p radiation and chemo. ? ?Reviewed: ?Allergy & Precautions, NPO status , Patient's Chart, lab work & pertinent test results ? ?History of Anesthesia Complications ?Negative for: history of anesthetic complications ? ?Airway ?Mallampati: II ? ?TM Distance: >3 FB ?Neck ROM: Full ? ? ? Dental ? ?(+) Poor Dentition ?  ?Pulmonary ?neg sleep apnea, neg COPD, Current SmokerPatient did not abstain from smoking.,  ?  ?Pulmonary exam normal ?breath sounds clear to auscultation ? ? ? ? ? ? Cardiovascular ?Exercise Tolerance: Good ?METS(-) hypertension(-) CAD and (-) Past MI negative cardio ROS ? ?(-) dysrhythmias  ?Rhythm:Regular Rate:Normal ?- Systolic murmurs ? ?  ?Neuro/Psych ?PSYCHIATRIC DISORDERS Depression negative neurological ROS ?   ? GI/Hepatic ?GERD  Controlled,(+)  ?  ? (-) substance abuse ? ,   ?Endo/Other  ?neg diabetes ? Renal/GU ?negative Renal ROS  ? ?  ?Musculoskeletal ? ? Abdominal ?  ?Peds ? Hematology ?  ?Anesthesia Other Findings ?Past Medical History: ?No date: GERD (gastroesophageal reflux disease) ?No date: Tonsil cancer ([place]) ? Reproductive/Obstetrics ? ?  ? ? ? ? ? ? ? ? ? ? ? ? ? ?  ?  ? ? ? ? ? ? ?Anesthesia Physical ?Anesthesia Plan ? ?ASA: 3 ? ?Anesthesia Plan: General  ? ?Post-op Pain Management: Minimal or no pain anticipated  ? ?Induction: Intravenous ? ?PONV Risk Score and Plan: 2 and Ondansetron, Dexamethasone and Midazolam ? ?Airway Management Planned: Oral ETT and Video Laryngoscope Planned ? ?Additional Equipment: None ? ?Intra-op Plan:  ? ?Post-operative Plan: Extubation in OR ? ?Informed Consent: I have reviewed the patients History and Physical, chart, labs and discussed the procedure including the risks, benefits and alternatives for the proposed anesthesia with the patient or authorized representative who has  indicated his/her understanding and acceptance.  ? ? ? ?Dental advisory given ? ?Plan Discussed with: CRNA and Surgeon ? ?Anesthesia Plan Comments: (Discussed risks of anesthesia with patient, including PONV, sore throat, lip/dental/eye damage. Rare risks discussed as well, such as cardiorespiratory and neurological sequelae, and allergic reactions. Discussed the role of CRNA in patient's perioperative care. Patient understands. ?Patient counseled on benefits of smoking cessation, and increased perioperative risks associated with continued smoking. ?)  ? ? ? ? ? ? ?Anesthesia Quick Evaluation ? ?

## 2022-01-24 NOTE — Discharge Instructions (Signed)

## 2022-01-24 NOTE — H&P (Signed)
The patient's history has been reviewed, patient examined, no change in status, stable for surgery.  Questions were answered to the patients satisfaction.  

## 2022-01-24 NOTE — Transfer of Care (Signed)
Immediate Anesthesia Transfer of Care Note ? ?Patient: Paul Hebert ? ?Procedure(s) Performed: DIRECT LARYNGOSCOPY WITH TONSIL BIOPSY (Left) ?TONGUE BIOPSY (Left) ? ?Patient Location: PACU ? ?Anesthesia Type:General ? ?Level of Consciousness: awake, drowsy and patient cooperative ? ?Airway & Oxygen Therapy: Patient Spontanous Breathing and Patient connected to face mask oxygen ? ?Post-op Assessment: Report given to RN and Post -op Vital signs reviewed and stable ? ?Post vital signs: Reviewed and stable ? ?Last Vitals:  ?Vitals Value Taken Time  ?BP 112/85 01/24/22 0820  ?Temp 36.2 ?C 01/24/22 0820  ?Pulse 68 01/24/22 0823  ?Resp 13 01/24/22 0823  ?SpO2 100 % 01/24/22 0823  ?Vitals shown include unvalidated device data. ? ?Last Pain:  ?Vitals:  ? 01/24/22 0820  ?TempSrc:   ?PainSc: Asleep  ?   ? ?  ? ?Complications: No notable events documented. ?

## 2022-01-25 LAB — SURGICAL PATHOLOGY

## 2022-01-26 ENCOUNTER — Telehealth: Payer: Self-pay | Admitting: *Deleted

## 2022-01-26 NOTE — Telephone Encounter (Signed)
Patient called asking for a return call to discuss results of recent biopsy and what to do next. His next appointment is not until 04/05/22. Please advise. ? ?SURGICAL PATHOLOGY SURGICAL PATHOLOGY  ?CASE: [id number]  ?PATIENT: Paul Hebert  ?Surgical Pathology Report  ? ? ? ? ?Specimen Submitted:  ?A. Tongue base, left  ?B. Tonsil, left inferior  ?C. Ulcer, lateral left posterior oropharynx  ?D. Ulcer, inferior left posterior oropharynx  ?E. Ulcer, medial left posterior oropharynx  ?F. Ulcer, central left posterior oropharynx  ? 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 Found to have left tongue lesion and left tonsil lesion.  ? ? ? ? ? ?DIAGNOSIS:  ?A. TONGUE BASE, LEFT; BIOPSY:  ?- INVASIVE SQUAMOUS CELL CARCINOMA, CLINICALLY RECURRENT.  ?- IN SITU CARCINOMA IS PRESENT.  ?- SEE COMMENT.  ? ?B.  TONSIL, LEFT INFERIOR; BIOPSY:  ?- SQUAMOUS MUCOSA WITH FOCAL MODERATE DYSPLASIA.  ?- FIBROSIS AND ACUTE INFLAMMATION.  ? ?C. ULCER, LATERAL LEFT POSTERIOR OROPHARYNX; BIOPSY:  ?- INVASIVE SQUAMOUS CELL CARCINOMA, CLINICALLY RECURRENT.  ? ?D.  ULCER, INFERIOR LEFT POSTERIOR OROPHARYNX; BIOPSY:  ?- INVASIVE SQUAMOUS CELL CARCINOMA, CLINICALLY RECURRENT.  ?- IN SITU CARCINOMA IS PRESENT.  ? ?E.  ULCER, MEDIAL LEFT POSTERIOR OROPHARYNX; BIOPSY:  ?- SQUAMOUS MUCOSA WITH FOCAL MILD DYSPLASIA.  ? ?F.  ULCER, CENTRAL LEFT POSTERIOR OROPHARYNX; BIOPSY:  ?- SQUAMOUS EPITHELIUM WITH MILD DYSPLASIA.  ? ?Comment:  ?A p16 stain was performed on part A and demonstrates diffuse strong  ?positivity in the invasive squamous cell carcinoma. These findings are  ?consistent with recurrent HPV positive squamous cell carcinoma.  ? ?IHC slides were prepared by [facility], [place]. All controls stained  ?appropriately.  ? ?This test was developed and its performance characteristics determined  ?by [facility]. It has not been  cleared or approved by the [place] Food and Drug  ?Administration. The FDA does not require this test to go through  ?premarket FDA review. This test is used for clinical purposes. It should  ?not be regarded as investigational or for research. This laboratory is  ?certified under the Clinical Laboratory Improvement Amendments (CLIA) as  ?qualified to perform high complexity clinical laboratory testing.  ? ?GROSS DESCRIPTION:  ?A. Labeled: Left tongue base  ?Received: Fresh  ?Collection time: 7:53 AM on 01/24/2022  ?Placed into formalin time: 8:32 AM on 01/24/2022  ?Tissue fragment(s): Multiple  ?Size: Aggregate, 0.8 x 0.7 x 0.1 cm  ?Description: Pink, mildly hemorrhagic soft tissue fragments  ?Entirely submitted in 1 cassette.  ? ?B. Labeled: Left inferior tonsil  ?Received: Formalin  ?Collection time: 7:52 AM on 01/24/2022  ?Placed into formalin time: 8:32 AM on 01/24/2022  ?Tissue fragment(s): 3  ?Size: Aggregate, 1.0 x 0.3 x 0.1 cm  ?Description: Pink soft tissue fragments  ?Entirely submitted in 1 cassette.  ? ?C. Labeled: Lateral ulcer  ?Received: Fresh  ?Collection time: 7:57 AM on 01/24/2022  ?Placed into formalin time: 8:32 AM on 01/24/2022  ?Tissue fragment(s): 2  ?Size: Ranges from 0.1-0.6 cm  ?Description: Pink soft tissue fragments  ?Entirely submitted in 1 cassette.  ? ?D. Labeled: Inferior ulcer  ?Received: Fresh  ?Collection time: 7:54 AM on 01/24/2022  ?Placed into formalin time: 8:32 AM on 01/24/2022  ?Tissue fragment(s): 2  ?Size: Both 0.3 cm  ?Description: Pink soft tissue fragments  ?Entirely submitted in 1 cassette.  ? ?E. Labeled: Medial ulcer  ?Received:  Fresh  ?Collection time: 7:55 AM on 01/24/2022  ?Placed into formalin time: 8:32 AM on 01/24/2022  ?Tissue fragment(s): 2  ?Size: Ranges from 0.2-0.3 cm  ?Description: Pink soft tissue fragments  ?Entirely submitted in 1 cassette.  ? ?F. Labeled: Central ulcer  ?Received: Fresh  ?Collection time: 7:59 AM on 01/24/2022  ?Placed into formalin time: 8:32 AM on  01/24/2022  ?Tissue fragment(s): Multiple  ?Size: Aggregate, 0.4 x 0.2 x 0.1 cm  ?Description: Pink soft tissue fragments  ?Entirely submitted in 1 cassette.  ? ?CM 01/24/2022  ? ?Final Diagnosis performed by Quay Burow, MD.   Electronically signed  ?01/25/2022 3:01:35PM  ?The electronic signature indicates that the named Attending Pathologist  ?has evaluated the specimen  ?Technical component performed at [facility], [address] [place],  ?[place] [address] Lab: [phone] Dir: Rush Farmer, MD, MMM  ? Professional component performed at [facility], [facility], [place], [place], [place] [address] Lab: [phone]  ?Dir: Kathi Simpers, MD   ?Resulting Agency [facility]  ?  ? ?  ?  ?Specimen Collected: 01/24/22 07:53 Last Resulted: 01/25/22 15:18  ?  ?  ?  ? ?

## 2022-01-26 NOTE — Telephone Encounter (Signed)
Jennifer/ Ana- Dr. Baruch Gouty and I need to see him together next week sometime. Please schedule and call him

## 2022-01-29 ENCOUNTER — Other Ambulatory Visit: Payer: Self-pay

## 2022-01-29 ENCOUNTER — Emergency Department
Admission: EM | Admit: 2022-01-29 | Discharge: 2022-01-30 | Disposition: A | Discharge status: Home / Self Care | Payer: Medicaid Other | Attending: Emergency Medicine | Admitting: Emergency Medicine

## 2022-01-29 DIAGNOSIS — R000 Tachycardia, unspecified: Secondary | ICD-10-CM | POA: Diagnosis not present

## 2022-01-29 DIAGNOSIS — Z85818 Personal history of malignant neoplasm of other sites of lip, oral cavity, and pharynx: Secondary | ICD-10-CM | POA: Insufficient documentation

## 2022-01-29 DIAGNOSIS — R1110 Vomiting, unspecified: Secondary | ICD-10-CM | POA: Insufficient documentation

## 2022-01-29 DIAGNOSIS — R079 Chest pain, unspecified: Secondary | ICD-10-CM | POA: Diagnosis present

## 2022-01-29 DIAGNOSIS — R0602 Shortness of breath: Secondary | ICD-10-CM | POA: Diagnosis not present

## 2022-01-29 DIAGNOSIS — R0789 Other chest pain: Secondary | ICD-10-CM

## 2022-01-29 NOTE — ED Triage Notes (Addendum)
Patient reports sudden onset of stabbing constant right sided chest pain that began 2-3 hours ago while arguing with someone. Reports shortness of breath and states he vomited twice prior to arrival. Holding chest and tearful during triage, stating "I don't want to die!" Resp even, unlabored on RA.  ?

## 2022-01-30 ENCOUNTER — Emergency Department: Payer: Medicaid Other

## 2022-01-30 LAB — COMPREHENSIVE METABOLIC PANEL
ALT: 17 U/L (ref 0–44)
AST: 22 U/L (ref 15–41)
Albumin: 4.1 g/dL (ref 3.5–5.0)
Alkaline Phosphatase: 58 U/L (ref 38–126)
Anion gap: 11 (ref 5–15)
BUN: 12 mg/dL (ref 6–20)
CO2: 25 mmol/L (ref 22–32)
Calcium: 8.9 mg/dL (ref 8.9–10.3)
Chloride: 103 mmol/L (ref 98–111)
Creatinine, Ser: 0.87 mg/dL (ref 0.61–1.24)
GFR, Estimated: >60 mL/min (ref >60)
Glucose, Bld: 107 mg/dL — ABNORMAL HIGH (ref 70–99)
Potassium: 3.4 mmol/L — ABNORMAL LOW (ref 3.5–5.1)
Sodium: 139 mmol/L (ref 135–145)
Total Bilirubin: 0.3 mg/dL (ref 0.3–1.2)
Total Protein: 7.1 g/dL (ref 6.5–8.1)

## 2022-01-30 LAB — CBC WITH DIFFERENTIAL/PLATELET
Abs Immature Granulocytes: 0.02 10*3/uL (ref 0.00–0.07)
Basophils Absolute: 0 10*3/uL (ref 0.0–0.1)
Basophils Relative: 0 %
Eosinophils Absolute: 0.3 10*3/uL (ref 0.0–0.5)
Eosinophils Relative: 6 %
HCT: 42.3 % (ref 39.0–52.0)
Hemoglobin: 15 g/dL (ref 13.0–17.0)
Immature Granulocytes: 0 %
Lymphocytes Relative: 28 %
Lymphs Abs: 1.5 10*3/uL (ref 0.7–4.0)
MCH: 32.8 pg (ref 26.0–34.0)
MCHC: 35.5 g/dL (ref 30.0–36.0)
MCV: 92.4 fL (ref 80.0–100.0)
Monocytes Absolute: 0.6 10*3/uL (ref 0.1–1.0)
Monocytes Relative: 10 %
Neutro Abs: 3.1 10*3/uL (ref 1.7–7.7)
Neutrophils Relative %: 56 %
Platelets: 264 10*3/uL (ref 150–400)
RBC: 4.58 MIL/uL (ref 4.22–5.81)
RDW: 12.2 % (ref 11.5–15.5)
WBC: 5.6 10*3/uL (ref 4.0–10.5)
nRBC: 0 % (ref 0.0–0.2)

## 2022-01-30 LAB — TROPONIN I (HIGH SENSITIVITY)
Troponin I (High Sensitivity): 4 ng/L (ref <18)
Troponin I (High Sensitivity): 4 ng/L (ref <18)

## 2022-01-30 MED ORDER — METOCLOPRAMIDE HCL 5 MG/ML IJ SOLN
10.0000 mg | INTRAMUSCULAR | Status: AC
  Administered 2022-01-30: 10 mg via INTRAVENOUS
  Filled 2022-01-30: qty 2

## 2022-01-30 MED ORDER — NAPROXEN 500 MG PO TABS: 500.0000 mg | ORAL_TABLET | Freq: Two times a day (BID) | ORAL | 0 refills | Status: DC

## 2022-01-30 MED ORDER — PANTOPRAZOLE SODIUM 40 MG IV SOLR
40.0000 mg | Freq: Once | INTRAVENOUS | Status: AC
  Administered 2022-01-30: 40 mg via INTRAVENOUS
  Filled 2022-01-30: qty 10

## 2022-01-30 MED ORDER — SODIUM CHLORIDE 0.9 % IV BOLUS
1000.0000 mL | Freq: Once | INTRAVENOUS | Status: AC
  Administered 2022-01-30: 1000 mL via INTRAVENOUS

## 2022-01-30 MED ORDER — IOHEXOL 350 MG/ML SOLN
75.0000 mL | Freq: Once | INTRAVENOUS | Status: AC | PRN
  Administered 2022-01-30: 75 mL via INTRAVENOUS

## 2022-01-30 MED ORDER — KETOROLAC TROMETHAMINE 15 MG/ML IJ SOLN
15.0000 mg | Freq: Once | INTRAMUSCULAR | Status: AC
Start: 2022-01-30 — End: 2022-01-30
  Administered 2022-01-30: 15 mg via INTRAVENOUS
  Filled 2022-01-30: qty 1

## 2022-01-30 MED ORDER — FAMOTIDINE 20 MG PO TABS
20.0000 mg | ORAL_TABLET | Freq: Two times a day (BID) | ORAL | 0 refills | Status: DC
Start: 2022-01-30 — End: 2022-02-01

## 2022-01-30 NOTE — Discharge Instructions (Addendum)
Your lab tests and CT scan is okay tonight. Please follow up with your doctor this week for further evaluation.  ?

## 2022-01-30 NOTE — ED Notes (Signed)
Patient reports that his CP is "getting worse". Dr Joni Fears notified and aware. ?

## 2022-01-30 NOTE — ED Provider Notes (Signed)
? ?[facility] ?Provider Note ? ? ? Event Date/Time  ? First MD Initiated Contact with Patient 01/29/22 2339   ?  (approximate) ? ? ?History  ? ?Chest Pain ? ? ?HPI ? ?Paul Hebert is a 51 y.o. male with a history of polysubstance abuse, recurrent oropharynx cancer who comes ED complaining of abrupt onset of chest pain and shortness of breath that started at about 9:00 PM tonight.  It is been constant and severe during this time.  Associated with vomiting.  Not exertional, no dizziness or syncope.  Nonradiating. ?  ? ? ?Physical Exam  ? ?Triage Vital Signs: ?ED Triage Vitals  ?Enc Vitals Group  ?   BP 01/29/22 2348 129/79  ?   Pulse Rate 01/29/22 2348 (!) 103  ?   Resp 01/29/22 2348 (!) 24  ?   Temp 01/29/22 2348 (!) 97.5 ?F (36.4 ?C)  ?   Temp Source 01/29/22 2348 Oral  ?   SpO2 01/29/22 2348 100 %  ?   Weight 01/29/22 2349 145 lb (65.8 kg)  ?   Height 01/29/22 2349 '5\' 9"'$  (1.753 m)  ?   Head Circumference --   ?   Peak Flow --   ?   Pain Score 01/29/22 2349 8  ?   Pain Loc --   ?   Pain Edu? --   ?   Excl. in [place]? --   ? ? ?Most recent vital signs: ?Vitals:  ? 01/30/22 0340 01/30/22 0400  ?BP: 125/63 102/63  ?Pulse: 86 79  ?Resp: 13 17  ?Temp:    ?SpO2: 97% 96%  ? ? ? ?General: Awake, no distress.  ?CV:  Good peripheral perfusion.  Tachycardia, heart rate 105 ?Resp:  Normal effort.  Tachypnea, respiratory rate of 22 ?Abd:  No distention.  Soft and nontender ?Other:  No lower extremity edema.  No calf tenderness.  Symmetric calf circumference. ? ? ?ED Results / Procedures / Treatments  ? ?Labs ?(all labs ordered are listed, but only abnormal results are displayed) ?Labs Reviewed  ?COMPREHENSIVE METABOLIC PANEL - Abnormal; Notable for the following components:  ?    Result Value  ? Potassium 3.4 (*)   ? Glucose, Bld 107 (*)   ? All other components within normal limits  ?CBC WITH DIFFERENTIAL/PLATELET  ?TROPONIN I (HIGH SENSITIVITY)  ?TROPONIN I (HIGH SENSITIVITY)  ? ? ? ?EKG ? ?Interpreted by  me ?Sinus tachycardia rate 101.  Normal axis and intervals.  Normal QRS ST segments and T waves.  No ischemic changes. ? ? ?RADIOLOGY ?CT angiogram chest viewed and interpreted by me, negative for PE, pneumonia, pleural effusion, pulmonary edema, pneumothorax.  Radiology report reviewed. ? ? ? ?PROCEDURES: ? ?Critical Care performed: No ? ?Procedures ? ? ?MEDICATIONS ORDERED IN ED: ?Medications  ?pantoprazole (PROTONIX) injection 40 mg (40 mg Intravenous Given 01/30/22 0136)  ?metoCLOPramide (REGLAN) injection 10 mg (10 mg Intravenous Given 01/30/22 0135)  ?sodium chloride 0.9 % bolus 1,000 mL (0 mLs Intravenous Stopped 01/30/22 0343)  ?ketorolac (TORADOL) 15 MG/ML injection 15 mg (15 mg Intravenous Given 01/30/22 0213)  ?iohexol (OMNIPAQUE) 350 MG/ML injection 75 mL (75 mLs Intravenous Contrast Given 01/30/22 0303)  ? ? ? ?IMPRESSION / MDM / ASSESSMENT AND PLAN / ED COURSE  ?I reviewed the triage vital signs and the nursing notes. ?             ?               ? ?  Differential diagnosis includes, but is not limited to, anxiety, GERD, non-STEMI, pulmonary embolism ? ?Patient presents with chest pain and shortness of breath with a history of recurrent cancer.  He had chemo and radiation and early 2022.  We will need to obtain CT angiogram to rule out PE.  Check labs, give Reglan and Protonix for symptom relief. ? ? ?Clinical Course as of 01/30/22 0426  ?Mon Jan 30, 2022  ?0408 CTA chest negative. [PS]  ?  ?Clinical Course User Index ?[PS] Carrie Mew, MD  ? ?Work-up reassuring.  Symptoms improved.  Not requiring admission with negative test results. Serial troponin normal. ? ? Considering the patient's symptoms, medical history, and physical examination today, I have low suspicion for ACS, PE, TAD, pneumothorax, carditis, mediastinitis, pneumonia, CHF, or sepsis. ? ? ? ? ?FINAL CLINICAL IMPRESSION(S) / ED DIAGNOSES  ? ?Final diagnoses:  ?Atypical chest pain  ?Chest wall pain  ? ? ? ?Rx / DC Orders  ? ?ED Discharge  Orders   ? ?      Ordered  ?  naproxen (NAPROSYN) 500 MG tablet  2 times daily with meals       ? 01/30/22 0425  ?  famotidine (PEPCID) 20 MG tablet  2 times daily       ? 01/30/22 0425  ? ?  ?  ? ?  ? ? ? ?Note:  This document was prepared using Dragon voice recognition software and may include unintentional dictation errors. ?  ?Carrie Mew, MD ?01/30/22 [phone] ? ?

## 2022-01-31 ENCOUNTER — Ambulatory Visit: Payer: Medicaid Other | Admitting: Oncology

## 2022-02-01 ENCOUNTER — Inpatient Hospital Stay: Payer: Medicaid Other | Attending: Oncology | Admitting: Oncology

## 2022-02-01 ENCOUNTER — Encounter: Payer: Self-pay | Admitting: Radiation Oncology

## 2022-02-01 ENCOUNTER — Encounter: Payer: Self-pay | Admitting: Oncology

## 2022-02-01 ENCOUNTER — Ambulatory Visit
Admission: RE | Admit: 2022-02-01 | Discharge: 2022-02-01 | Disposition: A | Discharge status: Home / Self Care | Payer: Medicaid Other | Source: Ambulatory Visit | Attending: Radiation Oncology | Admitting: Radiation Oncology

## 2022-02-01 ENCOUNTER — Ambulatory Visit: Payer: Medicaid Other | Admitting: Oncology

## 2022-02-01 VITALS — BP 137/97 | HR 85 | Temp 98.6°F | Resp 20 | Ht 68.5 in | Wt 150.9 lb

## 2022-02-01 VITALS — BP 148/95 | HR 83 | Temp 97.6°F | Resp 16 | Wt 150.8 lb

## 2022-02-01 DIAGNOSIS — C109 Malignant neoplasm of oropharynx, unspecified: Secondary | ICD-10-CM

## 2022-02-01 DIAGNOSIS — Z9221 Personal history of antineoplastic chemotherapy: Secondary | ICD-10-CM | POA: Insufficient documentation

## 2022-02-01 DIAGNOSIS — F17210 Nicotine dependence, cigarettes, uncomplicated: Secondary | ICD-10-CM | POA: Diagnosis not present

## 2022-02-01 DIAGNOSIS — K219 Gastro-esophageal reflux disease without esophagitis: Secondary | ICD-10-CM | POA: Diagnosis not present

## 2022-02-01 DIAGNOSIS — G893 Neoplasm related pain (acute) (chronic): Secondary | ICD-10-CM | POA: Diagnosis not present

## 2022-02-01 DIAGNOSIS — Z923 Personal history of irradiation: Secondary | ICD-10-CM | POA: Insufficient documentation

## 2022-02-01 DIAGNOSIS — C01 Malignant neoplasm of base of tongue: Secondary | ICD-10-CM | POA: Insufficient documentation

## 2022-02-01 DIAGNOSIS — Z7189 Other specified counseling: Secondary | ICD-10-CM | POA: Diagnosis not present

## 2022-02-01 DIAGNOSIS — R1310 Dysphagia, unspecified: Secondary | ICD-10-CM | POA: Insufficient documentation

## 2022-02-01 MED ORDER — PROCHLORPERAZINE MALEATE 10 MG PO TABS: 10.0000 mg | ORAL_TABLET | Freq: Four times a day (QID) | ORAL | 1 refills | Status: DC | PRN

## 2022-02-01 MED ORDER — LIDOCAINE-PRILOCAINE 2.5-2.5 % EX CREA: TOPICAL_CREAM | CUTANEOUS | 3 refills | Status: DC

## 2022-02-01 MED ORDER — DEXAMETHASONE 4 MG PO TABS: 8.0000 mg | ORAL_TABLET | Freq: Every day | ORAL | 1 refills | Status: DC

## 2022-02-01 MED ORDER — ONDANSETRON HCL 8 MG PO TABS: 8.0000 mg | ORAL_TABLET | Freq: Two times a day (BID) | ORAL | 1 refills | Status: DC | PRN

## 2022-02-01 MED ORDER — LORAZEPAM 0.5 MG PO TABS: 0.5000 mg | ORAL_TABLET | Freq: Four times a day (QID) | ORAL | 0 refills | Status: DC | PRN

## 2022-02-01 NOTE — Progress Notes (Signed)
Patient here for follow up of re-occuring cancer. ?He has pain in ear when he eats. He has lost 4 pounds since his last visit. ?

## 2022-02-01 NOTE — Progress Notes (Signed)
Radiation Oncology ?Follow up Note   old patient new area tongue recurrence ? ?Name: Paul Hebert   ?Date:   02/01/2022 ?MRN:  [id number] ?DOB: 09-03-1971  ? ? ?This 51 y.o. 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 ? ?REFERRING PROVIDER: No ref. provider found ? 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  Multiple biopsies were taken positive for invasive squamous cell carcinoma of the tongue base left tonsillar region was fibrous and acute inflammations.  The left lateral posterior oropharynx biopsy also was positive for invasive squamous cell carcinoma.  PET CT scan confirmed mild asymmetry along the left lateral post sulcal portion of the tongue with appreciable treatable CT abnormality.  The contralateral right tongue base had a SUV of 3.6.  No evidence of adenopathy in the head and neck region was noted.  Patient continues to have some dysphagia.  He is now referred for reevaluation.  His case was presented at our tumor board. ? ?COMPLICATIONS OF TREATMENT: none ? ?FOLLOW UP COMPLIANCE: keeps appointments  ? ?PHYSICAL EXAM:  ?BP (!) 148/95 (BP Location: Left Arm, Patient Position: Sitting)   Pulse 83   Temp 97.6 ?F (36.4 ?C) (Tympanic)   Resp 16   Wt 150 lb 12.8 oz (68.4 kg)   BMI 22.27 kg/m?  ?No evidence of adenopathy in the cervical or supraclavicular chain is noted.  Well-developed well-nourished patient in NAD. HEENT reveals PERLA, EOMI, discs not visualized.  Oral cavity is clear. No oral mucosal lesions are identified. Neck is clear without  evidence of cervical or supraclavicular adenopathy. Lungs are clear to A&P. Cardiac examination is essentially unremarkable with regular rate and rhythm without murmur rub or thrill. Abdomen is benign with no organomegaly or masses noted. Motor sensory and DTR levels are equal and symmetric in the upper and lower extremities. Cranial nerves II through XII are grossly intact. Proprioception is intact. No peripheral adenopathy or edema is identified. No motor or sensory levels are noted. Crude visual fields are within normal range. ? ?RADIOLOGY RESULTS: CT scans and PET CT scans reviewed compatible with above-stated findings ? ?PLAN: Present time I have recommended salvage radiation therapy to his tongue base.  We will treat his left tongue to 60 Gray over 6 weeks.  Treating the contralateral right tongue base to 50 Gray over the same time course.  Would use IMRT treatment planning and delivery with dose painting technique.  I would also recommend concurrent weekly chemotherapy.  There will be extra effort by both professional staff as well as technical staff to coordinate and manage concurrent chemoradiation and ensuing side effects during his treatments. ?Risks and benefits of treatment including possible increased dysphagia loss of taste skin reaction fatigue all were reviewed with the patient.  His side effects may be accentuated since this is a previously treated's area.  I have personally set up and ordered CT simulation. ? ?I would like to take this opportunity to thank you for allowing me to participate in the care of your patient.. ?  ? Eulas Post  Salene Mohamud, MD ? ?

## 2022-02-01 NOTE — H&P (View-Only) (Signed)
? ? ? ?Hematology/Oncology Consult note ?[place]  ?[phone] [phone] ? ?Patient Care Team: ?Default, Provider, MD as PCP - General ?Noreene Filbert, MD as Referring Physician (Radiation Oncology) ?Sindy Guadeloupe, MD as Consulting Physician (Oncology) ?Jules Husbands, MD as Consulting Physician (General Surgery)  ? ?Name of the patient: Paul Hebert  ?[id number]  ?08/27/1971  ? ?Date of visit: 02/01/22 ? 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 ?Chief complaint/ Reason for visit-discuss biopsy results and further management ? ?Heme/Onc history: patient is a 51 year old male who has been having ongoing throat pain for over 6 months. Patient states that he presented with these complaints 6 months ago and had a CT soft tissue neck which did not show any malignancy.  He continued to have on and off pain which was gradually getting worse and he presented back to the ER on 09/27/2020.   ?  ?CT soft tissue neck showed a left base of tongue mass measuring 3.4 x 2.6 x 3.3 cm which was invading the tongue along the anterior and medial margin and extending into the left vallecula.  Possible superior and posterior extension to involve the left palatine tonsil the lesion abuts the uvula with early extension into the soft palate.  Multiple left level 2 lymph nodes largest of which was 1.2 cm.  Additional necrotic limited to level 3 nodal conglomerate measures 1.3 cm.  No right-sided adenopathy. ?  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  PET CT scan Showed left base of tongue mass measuring about 2.8 cm with enlarged centrally necrotic level 2/3 left-sided and lymph nodes with an SUV about 3.7.  No hypermetabolic right neck nodes.  No findings of distant metastatic disease ?  ?Patient completed 5 cycles of weekly cisplatin chemotherapy between January to March 2022.  There were treatment interruptions due to  mucositis.  He could not complete 7 cycles eventually.  He did complete his radiation treatment. ?  ?Towards the end of radiation patient was admitted for suicidal ideation. ? ?Interval history-patient continues to complain of pain at the base of his left tongue which radiates to his left ear. ? ?ECOG PS- 1 ?Pain scale- 4 ? ? ?Review of systems- Review of Systems  ?Constitutional:  Negative for chills, fever, malaise/fatigue and weight loss.  ?HENT:  Negative for congestion, ear discharge and nosebleeds.   ?     Neck pain  ?Eyes:  Negative for blurred vision.  ?Respiratory:  Negative for cough, hemoptysis, sputum production, shortness of breath and wheezing.   ?Cardiovascular:  Negative for chest pain, palpitations, orthopnea and claudication.  ?Gastrointestinal:  Negative for abdominal pain, blood in stool, constipation, diarrhea, heartburn, melena, nausea and vomiting.  ?Genitourinary:  Negative for dysuria, flank pain, frequency, hematuria and urgency.  ?Musculoskeletal:  Negative for back pain, joint pain and myalgias.  ?Skin:  Negative for rash.  ?Neurological:  Negative for dizziness, tingling, focal weakness, seizures, weakness and headaches.  ?Endo/Heme/Allergies:  Does not bruise/bleed easily.  ?Psychiatric/Behavioral:  Negative for depression and suicidal ideas. The patient does not have insomnia.    ? ? ? ?No Known Allergies ? ? ?Past Medical History:  ?Diagnosis Date  ? GERD (gastroesophageal reflux disease)   ? Tonsil cancer ([place])   ? ? ? ?Past Surgical History:  ?Procedure Laterality Date  ? LARYNGOSCOPY Left 01/24/2022  ? Procedure: DIRECT LARYNGOSCOPY WITH TONSIL BIOPSY;  Surgeon: Beverly Gust, MD;  Location: [facility] ORS;  Service: ENT;  Laterality: Left;  ? PORT-A-CATH REMOVAL N/A 02/04/2021  ? Procedure: REMOVAL PORT-A-CATH;  Surgeon: Olean Ree, MD;  Location: [facility] ORS;  Service: General;  Laterality: N/A;  ? PORTA CATH INSERTION N/A 10/25/2020  ? Procedure: PORTA CATH INSERTION;  Surgeon: Algernon Huxley, MD;  Location: [facility];  Service: Cardiovascular;  Laterality: N/A;  ? REMOVAL OF GASTROSTOMY TUBE N/A 02/04/2021  ? Procedure: REMOVAL OF GASTROSTOMY TUBE;  Surgeon: Olean Ree, MD;  Location: [facility] ORS;  Service: General;  Laterality: N/A;  ? TONGUE BIOPSY Left 01/24/2022  ? Procedure: TONGUE BIOPSY;  Surgeon: Beverly Gust, MD;  Location: [facility] OR[facility];  Service: ENT;  Laterality: Left;  ? ? ?Social History  ? ?Socioeconomic History  ? Marital status: Single  ?  Spouse name: Not on file  ? Number of children: Not on file  ? Years of education: Not on file  ? Highest education level: Not on file  ?Occupational History  ? Not on file  ?Tobacco Use  ? Smoking status: Every Day  ?  Packs/day: 1.50  ?  Years: 35.00  ?  Pack years: 52.50  ?  Types: Cigarettes  ? Smokeless tobacco: Current  ? Tobacco comments:  ?  Patches very rarely  ?Vaping Use  ? Vaping Use: Former  ?Substance and Sexual Activity  ? Alcohol use: Yes  ?  Comment: occ  ? Drug use: Yes  ?  Types: Marijuana, "Crack" cocaine, Methamphetamines  ?  Comment: marijuana every day  ? Sexual activity: Not Currently  ?Other Topics Concern  ? Not on file  ?Social History Narrative  ? Not on file  ? ?Social Determinants of Health  ? ?Financial Resource Strain: Not on file  ?Food Insecurity: Not on file  ?Transportation Needs: Not on file  ?Physical Activity: Not on file  ?Stress: Not on file  ?Social Connections: Not on file  ?Intimate Partner Violence: Not on file  ? ? ?Family History  ?Problem Relation Age of Onset  ? Cancer Father   ? ? ? ?Current Outpatient Medications:  ?  calcium carbonate (TUMS - DOSED IN MG ELEMENTAL CALCIUM) 500 MG chewable tablet, Chew 1 tablet by mouth as needed for indigestion or heartburn., Disp: , Rfl:  ?  dexamethasone (DECADRON) 4 MG tablet, Take 2 tablets (8 mg total) by mouth daily. Take daily x 3 days starting the day after cisplatin chemotherapy. Take with food., Disp: 30 tablet, Rfl: 1 ?  gabapentin  (NEURONTIN) 300 MG capsule, Take 1 capsule (300 mg total) by mouth 3 (three) times daily. (Patient not taking: Reported on 02/01/2022), Disp: 90 capsule, Rfl: 1 ?  lidocaine-prilocaine (EMLA) cream, Apply to affected area once, Disp: 30 g, Rfl: 3 ?  LORazepam (ATIVAN) 0.5 MG tablet, Take 1 tablet (0.5 mg total) by mouth every 6 (six) hours as needed (Nausea or vomiting)., Disp: 30 tablet, Rfl: 0 ?  naproxen (NAPROSYN) 500 MG tablet, Take 1 tablet (500 mg total) by mouth 2 (two) times daily with a meal. (Patient not taking: Reported on 02/01/2022), Disp: 20 tablet, Rfl: 0 ?  ondansetron (ZOFRAN) 8 MG tablet, Take 1 tablet (8 mg total) by mouth 2 (two) times daily as needed. Start on the third day after cisplatin chemotherapy., Disp: 30 tablet, Rfl: 1 ?  prochlorperazine (COMPAZINE) 10 MG tablet, Take 1 tablet (10 mg total) by mouth every 6 (six) hours as needed (Nausea or vomiting)., Disp: 30 tablet, Rfl: 1 ? ?Physical exam:  ?Vitals:  ?  02/01/22 1143  ?BP: (!) 137/97  ?Pulse: 85  ?Resp: 20  ?Temp: 98.6 ?F (37 ?C)  ?TempSrc: Tympanic  ?SpO2: 100%  ?Weight: 150 lb 14.4 oz (68.4 kg)  ?Height: 5' 8.5" (1.74 m)  ? ?Physical Exam ?Cardiovascular:  ?   Rate and Rhythm: Normal rate and regular rhythm.  ?   Heart sounds: Normal heart sounds.  ?Pulmonary:  ?   Effort: Pulmonary effort is normal.  ?   Breath sounds: Normal breath sounds.  ?Skin: ?   General: Skin is warm and dry.  ?Neurological:  ?   Mental Status: He is alert and oriented to person, place, and time.  ?  ? ? ?  Latest Ref Rng & Units 01/30/2022  ?  1:27 AM  ?CMP  ?Glucose 70 - 99 mg/dL 107    ?BUN 6 - 20 mg/dL 12    ?Creatinine 0.61 - 1.24 mg/dL 0.87    ?Sodium 135 - 145 mmol/L 139    ?Potassium 3.5 - 5.1 mmol/L 3.4    ?Chloride 98 - 111 mmol/L 103    ?CO2 22 - 32 mmol/L 25    ?Calcium 8.9 - 10.3 mg/dL 8.9    ?Total Protein 6.5 - 8.1 g/dL 7.1    ?Total Bilirubin 0.3 - 1.2 mg/dL 0.3    ?Alkaline Phos 38 - 126 U/L 58    ?AST 15 - 41 U/L 22    ?ALT 0 - 44 U/L 17     ? ? ?  Latest Ref Rng & Units 01/30/2022  ?  1:27 AM  ?CBC  ?WBC 4.0 - 10.5 K/uL 5.6    ?Hemoglobin 13.0 - 17.0 g/dL 15.0    ?Hematocrit 39.0 - 52.0 % 42.3    ?Platelets 150 - 400 K/uL 264    ? ? ? ? ?CT ANGIO NECK W

## 2022-02-01 NOTE — Progress Notes (Signed)
DISCONTINUE ON PATHWAY REGIMEN - Head and Neck ? ? ?  A cycle is every 7 days: ?    Cisplatin  ? ?**Always confirm dose/schedule in your pharmacy ordering system** ? ?REASON: Disease Progression ?PRIOR TREATMENT: [id number]: Cisplatin 40 mg/m2 q7 Days with Concurrent Radiation ?TREATMENT RESPONSE: Progressive Disease (PD) ? ?START ON PATHWAY REGIMEN - Head and Neck ? ? ?  A cycle is every 7 days: ?    Cisplatin  ? ?**Always confirm dose/schedule in your pharmacy ordering system** ? ?Patient Characteristics: ?Oropharynx, HPV Positive, Local Recurrence, Candidate for Radiation Therapy ?Disease Classification: Oropharynx ?HPV Status: Positive (+) ?Therapeutic Status: Local Recurrence ?Is patient a candidate for radiation therapy<= Yes ?Intent of Therapy: ?Curative Intent, Discussed with Patient ?

## 2022-02-01 NOTE — Progress Notes (Signed)
? ? ? ?Hematology/Oncology Consult note ?[facility]  ?[phone] [phone] ? ?Patient Care Team: ?Default, Provider, MD as PCP - General ?Noreene Filbert, MD as Referring Physician (Radiation Oncology) ?Sindy Guadeloupe, MD as Consulting Physician (Oncology) ?Jules Husbands, MD as Consulting Physician (General Surgery)  ? ?Name of the patient: Paul Hebert  ?[id number]  ?June 10, 1971  ? ?Date of visit: 02/01/22 ? 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 ?Chief complaint/ Reason for visit-discuss biopsy results and further management ? ?Heme/Onc history: patient is a 51 year old male who has been having ongoing throat pain for over 6 months. Patient states that he presented with these complaints 6 months ago and had a CT soft tissue neck which did not show any malignancy.  He continued to have on and off pain which was gradually getting worse and he presented back to the ER on 09/27/2020.   ?  ?CT soft tissue neck showed a left base of tongue mass measuring 3.4 x 2.6 x 3.3 cm which was invading the tongue along the anterior and medial margin and extending into the left vallecula.  Possible superior and posterior extension to involve the left palatine tonsil the lesion abuts the uvula with early extension into the soft palate.  Multiple left level 2 lymph nodes largest of which was 1.2 cm.  Additional necrotic limited to level 3 nodal conglomerate measures 1.3 cm.  No right-sided adenopathy. ?  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  PET CT scan Showed left base of tongue mass measuring about 2.8 cm with enlarged centrally necrotic level 2/3 left-sided and lymph nodes with an SUV about 3.7.  No hypermetabolic right neck nodes.  No findings of distant metastatic disease ?  ?Patient completed 5 cycles of weekly cisplatin chemotherapy between January to March 2022.  There were treatment interruptions due to  mucositis.  He could not complete 7 cycles eventually.  He did complete his radiation treatment. ?  ?Towards the end of radiation patient was admitted for suicidal ideation. ? ?Interval history-patient continues to complain of pain at the base of his left tongue which radiates to his left ear. ? ?ECOG PS- 1 ?Pain scale- 4 ? ? ?Review of systems- Review of Systems  ?Constitutional:  Negative for chills, fever, malaise/fatigue and weight loss.  ?HENT:  Negative for congestion, ear discharge and nosebleeds.   ?     Neck pain  ?Eyes:  Negative for blurred vision.  ?Respiratory:  Negative for cough, hemoptysis, sputum production, shortness of breath and wheezing.   ?Cardiovascular:  Negative for chest pain, palpitations, orthopnea and claudication.  ?Gastrointestinal:  Negative for abdominal pain, blood in stool, constipation, diarrhea, heartburn, melena, nausea and vomiting.  ?Genitourinary:  Negative for dysuria, flank pain, frequency, hematuria and urgency.  ?Musculoskeletal:  Negative for back pain, joint pain and myalgias.  ?Skin:  Negative for rash.  ?Neurological:  Negative for dizziness, tingling, focal weakness, seizures, weakness and headaches.  ?Endo/Heme/Allergies:  Does not bruise/bleed easily.  ?Psychiatric/Behavioral:  Negative for depression and suicidal ideas. The patient does not have insomnia.    ? ? ? ?No Known Allergies ? ? ?Past Medical History:  ?Diagnosis Date  ? GERD (gastroesophageal reflux disease)   ? Tonsil cancer ([place])   ? ? ? ?Past Surgical History:  ?Procedure Laterality Date  ? LARYNGOSCOPY Left 01/24/2022  ? Procedure: DIRECT LARYNGOSCOPY WITH TONSIL BIOPSY;  Surgeon: Beverly Gust, MD;  Location: [facility] OR[facility];  Service: ENT;  Laterality: Left;  ? PORT-A-CATH REMOVAL N/A 02/04/2021  ? Procedure: REMOVAL PORT-A-CATH;  Surgeon: Olean Ree, MD;  Location: [facility] OR[facility];  Service: General;  Laterality: N/A;  ? PORTA CATH INSERTION N/A 10/25/2020  ? Procedure: PORTA CATH INSERTION;  Surgeon: Algernon Huxley, MD;  Location: [facility];  Service: Cardiovascular;  Laterality: N/A;  ? REMOVAL OF GASTROSTOMY TUBE N/A 02/04/2021  ? Procedure: REMOVAL OF GASTROSTOMY TUBE;  Surgeon: Olean Ree, MD;  Location: [facility];  Service: General;  Laterality: N/A;  ? TONGUE BIOPSY Left 01/24/2022  ? Procedure: TONGUE BIOPSY;  Surgeon: Beverly Gust, MD;  Location: [facility];  Service: ENT;  Laterality: Left;  ? ? ?Social History  ? ?Socioeconomic History  ? Marital status: Single  ?  Spouse name: Not on file  ? Number of children: Not on file  ? Years of education: Not on file  ? Highest education level: Not on file  ?Occupational History  ? Not on file  ?Tobacco Use  ? Smoking status: Every Day  ?  Packs/day: 1.50  ?  Years: 35.00  ?  Pack years: 52.50  ?  Types: Cigarettes  ? Smokeless tobacco: Current  ? Tobacco comments:  ?  Patches very rarely  ?Vaping Use  ? Vaping Use: Former  ?Substance and Sexual Activity  ? Alcohol use: Yes  ?  Comment: occ  ? Drug use: Yes  ?  Types: Marijuana, "Crack" cocaine, Methamphetamines  ?  Comment: marijuana every day  ? Sexual activity: Not Currently  ?Other Topics Concern  ? Not on file  ?Social History Narrative  ? Not on file  ? ?Social Determinants of Health  ? ?Financial Resource Strain: Not on file  ?Food Insecurity: Not on file  ?Transportation Needs: Not on file  ?Physical Activity: Not on file  ?Stress: Not on file  ?Social Connections: Not on file  ?Intimate Partner Violence: Not on file  ? ? ?Family History  ?Problem Relation Age of Onset  ? Cancer Father   ? ? ? ?Current Outpatient Medications:  ?  calcium carbonate (TUMS - DOSED IN MG ELEMENTAL CALCIUM) 500 MG chewable tablet, Chew 1 tablet by mouth as needed for indigestion or heartburn., Disp: , Rfl:  ?  dexamethasone (DECADRON) 4 MG tablet, Take 2 tablets (8 mg total) by mouth daily. Take daily x 3 days starting the day after cisplatin chemotherapy. Take with food., Disp: 30 tablet, Rfl: 1 ?  gabapentin  (NEURONTIN) 300 MG capsule, Take 1 capsule (300 mg total) by mouth 3 (three) times daily. (Patient not taking: Reported on 02/01/2022), Disp: 90 capsule, Rfl: 1 ?  lidocaine-prilocaine (EMLA) cream, Apply to affected area once, Disp: 30 g, Rfl: 3 ?  LORazepam (ATIVAN) 0.5 MG tablet, Take 1 tablet (0.5 mg total) by mouth every 6 (six) hours as needed (Nausea or vomiting)., Disp: 30 tablet, Rfl: 0 ?  naproxen (NAPROSYN) 500 MG tablet, Take 1 tablet (500 mg total) by mouth 2 (two) times daily with a meal. (Patient not taking: Reported on 02/01/2022), Disp: 20 tablet, Rfl: 0 ?  ondansetron (ZOFRAN) 8 MG tablet, Take 1 tablet (8 mg total) by mouth 2 (two) times daily as needed. Start on the third day after cisplatin chemotherapy., Disp: 30 tablet, Rfl: 1 ?  prochlorperazine (COMPAZINE) 10 MG tablet, Take 1 tablet (10 mg total) by mouth every 6 (six) hours as needed (Nausea or vomiting)., Disp: 30 tablet, Rfl: 1 ? ?Physical exam:  ?Vitals:  ?  02/01/22 1143  ?BP: (!) 137/97  ?Pulse: 85  ?Resp: 20  ?Temp: 98.6 ?F (37 ?C)  ?TempSrc: Tympanic  ?SpO2: 100%  ?Weight: 150 lb 14.4 oz (68.4 kg)  ?Height: 5' 8.5" (1.74 m)  ? ?Physical Exam ?Cardiovascular:  ?   Rate and Rhythm: Normal rate and regular rhythm.  ?   Heart sounds: Normal heart sounds.  ?Pulmonary:  ?   Effort: Pulmonary effort is normal.  ?   Breath sounds: Normal breath sounds.  ?Skin: ?   General: Skin is warm and dry.  ?Neurological:  ?   Mental Status: He is alert and oriented to person, place, and time.  ?  ? ? ?  Latest Ref Rng & Units 01/30/2022  ?  1:27 AM  ?CMP  ?Glucose 70 - 99 mg/dL 107    ?BUN 6 - 20 mg/dL 12    ?Creatinine 0.61 - 1.24 mg/dL 0.87    ?Sodium 135 - 145 mmol/L 139    ?Potassium 3.5 - 5.1 mmol/L 3.4    ?Chloride 98 - 111 mmol/L 103    ?CO2 22 - 32 mmol/L 25    ?Calcium 8.9 - 10.3 mg/dL 8.9    ?Total Protein 6.5 - 8.1 g/dL 7.1    ?Total Bilirubin 0.3 - 1.2 mg/dL 0.3    ?Alkaline Phos 38 - 126 U/L 58    ?AST 15 - 41 U/L 22    ?ALT 0 - 44 U/L 17     ? ? ?  Latest Ref Rng & Units 01/30/2022  ?  1:27 AM  ?CBC  ?WBC 4.0 - 10.5 K/uL 5.6    ?Hemoglobin 13.0 - 17.0 g/dL 15.0    ?Hematocrit 39.0 - 52.0 % 42.3    ?Platelets 150 - 400 K/uL 264    ? ? ? ? ?CT ANGIO NECK W

## 2022-02-02 ENCOUNTER — Ambulatory Visit
Admission: RE | Admit: 2022-02-02 | Discharge: 2022-02-02 | Disposition: A | Discharge status: Home / Self Care | Payer: Medicaid Other | Source: Ambulatory Visit | Attending: Radiation Oncology | Admitting: Radiation Oncology

## 2022-02-02 ENCOUNTER — Other Ambulatory Visit: Payer: Self-pay | Admitting: *Deleted

## 2022-02-02 DIAGNOSIS — C01 Malignant neoplasm of base of tongue: Secondary | ICD-10-CM | POA: Diagnosis not present

## 2022-02-06 NOTE — Progress Notes (Signed)
Pharmacist Chemotherapy Monitoring - Initial Assessment   ? ?Anticipated start date: 02/13/22  ? ?The following has been reviewed per standard work regarding the patient's treatment regimen: ?The patient's diagnosis, treatment plan and drug doses, and organ/hematologic function ?Lab orders and baseline tests specific to treatment regimen  ?The treatment plan start date, drug sequencing, and pre-medications ?Prior authorization status  ?Patient's documented medication list, including drug-drug interaction screen and prescriptions for anti-emetics and supportive care specific to the treatment regimen ?The drug concentrations, fluid compatibility, administration routes, and timing of the medications to be used ?The patient's access for treatment and lifetime cumulative dose history, if applicable  ?The patient's medication allergies and previous infusion related reactions, if applicable  ? ?Changes made to treatment plan:  ?N/A ? ?Follow up needed:  ?N/A ? ? ?Adelina Mings, [facility], ?02/06/2022  12:04 PM  ?

## 2022-02-07 ENCOUNTER — Telehealth (INDEPENDENT_AMBULATORY_CARE_PROVIDER_SITE_OTHER): Payer: Self-pay

## 2022-02-07 DIAGNOSIS — C01 Malignant neoplasm of base of tongue: Secondary | ICD-10-CM | POA: Diagnosis not present

## 2022-02-07 NOTE — Telephone Encounter (Signed)
Spoke with the patient and he is scheduled with Dr. Lucky Cowboy on 02/09/22 with a 6:45 am arrival time to the [facility]. Patient is scheduled for a port placement. Pre-procedure instructions were discussed and patient understood. ?

## 2022-02-09 ENCOUNTER — Encounter: Payer: Self-pay | Admitting: Vascular Surgery

## 2022-02-09 ENCOUNTER — Other Ambulatory Visit: Payer: Self-pay

## 2022-02-09 ENCOUNTER — Ambulatory Visit
Admission: RE | Admit: 2022-02-09 | Discharge: 2022-02-09 | Disposition: A | Discharge status: Home / Self Care | Payer: Medicaid Other | Source: Ambulatory Visit | Attending: Vascular Surgery | Admitting: Vascular Surgery

## 2022-02-09 ENCOUNTER — Ambulatory Visit: Admission: RE | Admit: 2022-02-09 | Payer: Medicaid Other | Source: Ambulatory Visit

## 2022-02-09 ENCOUNTER — Encounter
Admission: RE | Disposition: A | Discharge status: Home / Self Care | Payer: Self-pay | Source: Ambulatory Visit | Attending: Vascular Surgery

## 2022-02-09 DIAGNOSIS — C109 Malignant neoplasm of oropharynx, unspecified: Secondary | ICD-10-CM

## 2022-02-09 DIAGNOSIS — F17210 Nicotine dependence, cigarettes, uncomplicated: Secondary | ICD-10-CM | POA: Insufficient documentation

## 2022-02-09 DIAGNOSIS — G893 Neoplasm related pain (acute) (chronic): Secondary | ICD-10-CM | POA: Insufficient documentation

## 2022-02-09 HISTORY — PX: PORTA CATH INSERTION: CATH118285

## 2022-02-09 SURGERY — PORTA CATH INSERTION
Anesthesia: Moderate Sedation

## 2022-02-09 MED ORDER — MIDAZOLAM HCL 2 MG/2ML IJ SOLN
INTRAMUSCULAR | Status: AC
  Filled 2022-02-09: qty 4

## 2022-02-09 MED ORDER — METHYLPREDNISOLONE SODIUM SUCC 125 MG IJ SOLR: 125.0000 mg | Freq: Once | INTRAMUSCULAR | Status: DC | PRN

## 2022-02-09 MED ORDER — FENTANYL CITRATE (PF) 100 MCG/2ML IJ SOLN
INTRAMUSCULAR | Status: DC | PRN
Start: 2022-02-09 — End: 2022-02-09
  Administered 2022-02-09: 50 ug via INTRAVENOUS

## 2022-02-09 MED ORDER — CEFAZOLIN SODIUM-DEXTROSE 2-4 GM/100ML-% IV SOLN
2.0000 g | Freq: Once | INTRAVENOUS | Status: AC
  Administered 2022-02-09: 2 g via INTRAVENOUS

## 2022-02-09 MED ORDER — CHLORHEXIDINE GLUCONATE CLOTH 2 % EX PADS: 6.0000 | MEDICATED_PAD | Freq: Every day | CUTANEOUS | Status: DC

## 2022-02-09 MED ORDER — ONDANSETRON HCL 4 MG/2ML IJ SOLN: 4.0000 mg | Freq: Four times a day (QID) | INTRAMUSCULAR | Status: DC | PRN

## 2022-02-09 MED ORDER — MIDAZOLAM HCL 2 MG/2ML IJ SOLN
INTRAMUSCULAR | Status: DC | PRN
Start: 2022-02-09 — End: 2022-02-09
  Administered 2022-02-09: 2 mg via INTRAVENOUS

## 2022-02-09 MED ORDER — FAMOTIDINE 20 MG PO TABS: 40.0000 mg | ORAL_TABLET | Freq: Once | ORAL | Status: DC | PRN

## 2022-02-09 MED ORDER — FENTANYL CITRATE (PF) 100 MCG/2ML IJ SOLN
INTRAMUSCULAR | Status: AC
  Filled 2022-02-09: qty 2

## 2022-02-09 MED ORDER — MIDAZOLAM HCL 2 MG/ML PO SYRP
8.0000 mg | ORAL_SOLUTION | Freq: Once | ORAL | Status: DC | PRN
Start: 2022-02-09 — End: 2022-02-09

## 2022-02-09 MED ORDER — HYDROMORPHONE HCL 1 MG/ML IJ SOLN: 1.0000 mg | Freq: Once | INTRAMUSCULAR | Status: DC | PRN

## 2022-02-09 MED ORDER — SODIUM CHLORIDE 0.9 % IV SOLN
80.0000 mg | Freq: Once | INTRAVENOUS | Status: DC
  Filled 2022-02-09: qty 2

## 2022-02-09 MED ORDER — SODIUM CHLORIDE 0.9 % IV SOLN: INTRAVENOUS | Status: DC

## 2022-02-09 SURGICAL SUPPLY — 13 items
COVER PROBE U/S 5X48 (MISCELLANEOUS) ×1 IMPLANT
COVER SURGICAL LIGHT HANDLE (MISCELLANEOUS) ×1 IMPLANT
DERMABOND ADVANCED (GAUZE/BANDAGES/DRESSINGS) ×1
DERMABOND ADVANCED .7 DNX12 (GAUZE/BANDAGES/DRESSINGS) IMPLANT
HANDLE YANKAUER SUCT BULB TIP (MISCELLANEOUS) ×1 IMPLANT
KIT PORT POWER 8FR ISP CVUE (Port) ×1 IMPLANT
PACK ANGIOGRAPHY (CUSTOM PROCEDURE TRAY) ×2 IMPLANT
PENCIL ELECTRO HAND CTR (MISCELLANEOUS) ×1 IMPLANT
SPONGE XRAY 4X4 16PLY STRL (MISCELLANEOUS) ×1 IMPLANT
SUT MNCRL AB 4-0 PS2 18 (SUTURE) ×1 IMPLANT
SUT VIC AB 3-0 SH 27 (SUTURE) ×1
SUT VIC AB 3-0 SH 27X BRD (SUTURE) IMPLANT
TUBING CONNECTING 10 (TUBING) ×2 IMPLANT

## 2022-02-09 NOTE — Interval H&P Note (Signed)
History and Physical Interval Note: ? ?02/09/2022 ?8:02 AM ? ?SKILER OLDEN  has presented today for surgery, with the diagnosis of Porta Cath Placement   Sq cell Ca oropharynx.  The various methods of treatment have been discussed with the patient and family. After consideration of risks, benefits and other options for treatment, the patient has consented to  Procedure(s): ?PORTA CATH INSERTION (N/A) as a surgical intervention.  The patient's history has been reviewed, patient examined, no change in status, stable for surgery.  I have reviewed the patient's chart and labs.  Questions were answered to the patient's satisfaction.   ? ? ?Leotis Pain ? ? ?

## 2022-02-09 NOTE — Op Note (Signed)
?      [facility] ?      Operative Note ? ?Date: 02/09/2022 ? ?Preoperative diagnosis:  1. Oropharyngeal cancer ? ?Postoperative diagnosis:  Same as above ? ?Procedures: ?#1. Ultrasound guidance for vascular access to the right internal jugular vein. ?#2. Fluoroscopic guidance for placement of catheter. ?#3. Placement of CT compatible Port-A-Cath, right internal jugular vein. ? ?Surgeon: Leotis Pain, MD.  ? ?Anesthesia: Local with moderate conscious sedation for approximately 21  minutes using 2 mg of Versed and 50 mcg of Fentanyl ? ?Fluoroscopy time: less than 1 minute ? ?Contrast used: 0 ? ?Estimated blood loss: 5 cc ? ?Indication for the procedure:  The patient is a 51 y.o.male with oropharyngeal cancer.  The patient needs a Port-A-Cath for durable venous access, chemotherapy, lab draws, and CT scans. We are asked to place this. Risks and benefits were discussed and informed consent was obtained. ? ?Description of procedure: The patient was brought to the vascular and interventional radiology suite.  Moderate conscious sedation was administered throughout the procedure during a face to face encounter with the patient with my supervision of the RN administering medicines and monitoring the patient's vital signs, pulse oximetry, telemetry and mental status throughout from the start of the procedure until the patient was taken to the recovery room. The right neck chest and shoulder were sterilely prepped and draped, and a sterile surgical field was created. Ultrasound was used to help visualize a patent right internal jugular vein. This was then accessed under direct ultrasound guidance without difficulty with the Seldinger needle and a permanent image was recorded. A J-wire was placed. After skin nick and dilatation, the peel-away sheath was then placed over the wire. I then anesthetized an area under the clavicle approximately 1-2 fingerbreadths. A transverse incision was created and an  inferior pocket was created with electrocautery and blunt dissection. The port was then brought onto the field, placed into the pocket and secured to the chest wall with 2 Prolene sutures. The catheter was connected to the port and tunneled from the subclavicular incision to the access site. Fluoroscopic guidance was then used to cut the catheter to an appropriate length. The catheter was then placed through the peel-away sheath and the peel-away sheath was removed. The catheter tip was parked in excellent location under fluorocoscopic guidance in the cavoatrial junction. The pocket was then irrigated with antibiotic impregnated saline and the wound was closed with a running 3-0 Vicryl and a 4-0 Monocryl. The access incision was closed with a single 4-0 Monocryl. The Huber needle was used to withdraw blood and flush the port with heparinized saline. Dermabond was then placed as a dressing. The patient tolerated the procedure well and was taken to the recovery room in stable condition. ? ? ?Leotis Pain ?02/09/2022 ?8:29 AM ? ? ?This note was created with Dragon Medical transcription system. Any errors in dictation are purely unintentional.  ?

## 2022-02-13 ENCOUNTER — Ambulatory Visit
Admission: RE | Admit: 2022-02-13 | Discharge: 2022-02-13 | Disposition: A | Discharge status: Home / Self Care | Payer: Medicaid Other | Source: Ambulatory Visit | Attending: Radiation Oncology | Admitting: Radiation Oncology

## 2022-02-13 ENCOUNTER — Inpatient Hospital Stay: Payer: Medicaid Other

## 2022-02-13 ENCOUNTER — Inpatient Hospital Stay (HOSPITAL_BASED_OUTPATIENT_CLINIC_OR_DEPARTMENT_OTHER): Payer: Medicaid Other | Admitting: Oncology

## 2022-02-13 ENCOUNTER — Other Ambulatory Visit: Payer: Self-pay | Admitting: *Deleted

## 2022-02-13 ENCOUNTER — Other Ambulatory Visit: Payer: Self-pay

## 2022-02-13 ENCOUNTER — Encounter: Payer: Self-pay | Admitting: Oncology

## 2022-02-13 VITALS — BP 130/89 | HR 79 | Temp 96.7°F | Resp 16 | Wt 148.2 lb

## 2022-02-13 VITALS — BP 135/77 | HR 80

## 2022-02-13 DIAGNOSIS — Z5111 Encounter for antineoplastic chemotherapy: Secondary | ICD-10-CM

## 2022-02-13 DIAGNOSIS — G893 Neoplasm related pain (acute) (chronic): Secondary | ICD-10-CM | POA: Diagnosis not present

## 2022-02-13 DIAGNOSIS — C109 Malignant neoplasm of oropharynx, unspecified: Secondary | ICD-10-CM

## 2022-02-13 DIAGNOSIS — C01 Malignant neoplasm of base of tongue: Secondary | ICD-10-CM | POA: Insufficient documentation

## 2022-02-13 DIAGNOSIS — Z79899 Other long term (current) drug therapy: Secondary | ICD-10-CM | POA: Insufficient documentation

## 2022-02-13 LAB — CBC WITH DIFFERENTIAL/PLATELET
Abs Immature Granulocytes: 0.01 10*3/uL (ref 0.00–0.07)
Basophils Absolute: 0 10*3/uL (ref 0.0–0.1)
Basophils Relative: 1 %
Eosinophils Absolute: 0.4 10*3/uL (ref 0.0–0.5)
Eosinophils Relative: 7 %
HCT: 44.5 % (ref 39.0–52.0)
Hemoglobin: 16.1 g/dL (ref 13.0–17.0)
Immature Granulocytes: 0 %
Lymphocytes Relative: 25 %
Lymphs Abs: 1.3 10*3/uL (ref 0.7–4.0)
MCH: 33.7 pg (ref 26.0–34.0)
MCHC: 36.2 g/dL — ABNORMAL HIGH (ref 30.0–36.0)
MCV: 93.1 fL (ref 80.0–100.0)
Monocytes Absolute: 0.4 10*3/uL (ref 0.1–1.0)
Monocytes Relative: 8 %
Neutro Abs: 3.2 10*3/uL (ref 1.7–7.7)
Neutrophils Relative %: 59 %
Platelets: 256 10*3/uL (ref 150–400)
RBC: 4.78 MIL/uL (ref 4.22–5.81)
RDW: 12.1 % (ref 11.5–15.5)
WBC: 5.3 10*3/uL (ref 4.0–10.5)
nRBC: 0 % (ref 0.0–0.2)

## 2022-02-13 LAB — RAD ONC ARIA SESSION SUMMARY
Course Elapsed Days: 0
Plan Fractions Treated to Date: 1
Plan Prescribed Dose Per Fraction: 2 Gy
Plan Total Fractions Prescribed: 30
Plan Total Prescribed Dose: 60 Gy
Reference Point Dosage Given to Date: 2 Gy
Reference Point Session Dosage Given: 2 Gy
Session Number: 1

## 2022-02-13 LAB — BASIC METABOLIC PANEL
Anion gap: 5 (ref 5–15)
BUN: 17 mg/dL (ref 6–20)
CO2: 27 mmol/L (ref 22–32)
Calcium: 9.3 mg/dL (ref 8.9–10.3)
Chloride: 100 mmol/L (ref 98–111)
Creatinine, Ser: 0.94 mg/dL (ref 0.61–1.24)
GFR, Estimated: >60 mL/min (ref >60)
Glucose, Bld: 103 mg/dL — ABNORMAL HIGH (ref 70–99)
Potassium: 4.1 mmol/L (ref 3.5–5.1)
Sodium: 132 mmol/L — ABNORMAL LOW (ref 135–145)

## 2022-02-13 LAB — MAGNESIUM: Magnesium: 2 mg/dL (ref 1.7–2.4)

## 2022-02-13 MED ORDER — PALONOSETRON HCL INJECTION 0.25 MG/5ML
0.2500 mg | Freq: Once | INTRAVENOUS | Status: AC
  Administered 2022-02-13: 0.25 mg via INTRAVENOUS
  Filled 2022-02-13: qty 5

## 2022-02-13 MED ORDER — MAGNESIUM SULFATE 2 GM/50ML IV SOLN
2.0000 g | Freq: Once | INTRAVENOUS | Status: AC
  Administered 2022-02-13: 2 g via INTRAVENOUS
  Filled 2022-02-13: qty 50

## 2022-02-13 MED ORDER — HEPARIN SOD (PORK) LOCK FLUSH 100 UNIT/ML IV SOLN
500.0000 [IU] | Freq: Once | INTRAVENOUS | Status: AC | PRN
  Administered 2022-02-13: 500 [IU]
  Filled 2022-02-13: qty 5

## 2022-02-13 MED ORDER — SODIUM CHLORIDE 0.9 % IV SOLN
40.0000 mg/m2 | Freq: Once | INTRAVENOUS | Status: AC
  Administered 2022-02-13: 73 mg via INTRAVENOUS
  Filled 2022-02-13: qty 73

## 2022-02-13 MED ORDER — OXYCODONE HCL 5 MG PO TABS: 5.0000 mg | ORAL_TABLET | Freq: Three times a day (TID) | ORAL | 0 refills | Status: DC | PRN

## 2022-02-13 MED ORDER — SODIUM CHLORIDE 0.9 % IV SOLN
Freq: Once | INTRAVENOUS | Status: AC
  Filled 2022-02-13: qty 250

## 2022-02-13 MED ORDER — LORAZEPAM 0.5 MG PO TABS: 0.5000 mg | ORAL_TABLET | Freq: Every day | ORAL | 0 refills | Status: DC

## 2022-02-13 MED ORDER — POTASSIUM CHLORIDE IN NACL 20-0.9 MEQ/L-% IV SOLN
Freq: Once | INTRAVENOUS | Status: AC
  Filled 2022-02-13: qty 1000

## 2022-02-13 MED ORDER — SODIUM CHLORIDE 0.9 % IV SOLN
150.0000 mg | Freq: Once | INTRAVENOUS | Status: AC
  Administered 2022-02-13: 150 mg via INTRAVENOUS
  Filled 2022-02-13: qty 150

## 2022-02-13 MED ORDER — SODIUM CHLORIDE 0.9 % IV SOLN
10.0000 mg | Freq: Once | INTRAVENOUS | Status: AC
  Administered 2022-02-13: 10 mg via INTRAVENOUS
  Filled 2022-02-13: qty 10

## 2022-02-13 NOTE — Progress Notes (Signed)
Pt states that during radiation he experiencing panic attack and will like to know if he can have more ATIVAN due to only being prescribed a ten day supply last visit.  ?

## 2022-02-13 NOTE — Patient Instructions (Signed)
[facility] [place]  Discharge Instructions: ?Thank you for choosing [facility] to provide your oncology and hematology care.  ?If you have a lab appointment with the [facility], please go directly to the [facility] and check in at the registration area. ? ?Wear comfortable clothing and clothing appropriate for easy access to any Portacath or PICC line.  ? ?We strive to give you quality time with your provider. You may need to reschedule your appointment if you arrive late (15 or more minutes).  Arriving late affects you and other patients whose appointments are after yours.  Also, if you miss three or more appointments without notifying the office, you may be dismissed from the clinic at the provider?s discretion.    ?  ?For prescription refill requests, have your pharmacy contact our office and allow 72 hours for refills to be completed.   ? ?Today you received the following chemotherapy and/or immunotherapy agents: Cisplatin. ?  ?To help prevent nausea and vomiting after your treatment, we encourage you to take your nausea medication as directed. ? ?BELOW ARE SYMPTOMS THAT SHOULD BE REPORTED IMMEDIATELY: ?*FEVER GREATER THAN 100.4 F (38 ?C) OR HIGHER ?*CHILLS OR SWEATING ?*NAUSEA AND VOMITING THAT IS NOT CONTROLLED WITH YOUR NAUSEA MEDICATION ?*UNUSUAL SHORTNESS OF BREATH ?*UNUSUAL BRUISING OR BLEEDING ?*URINARY PROBLEMS (pain or burning when urinating, or frequent urination) ?*BOWEL PROBLEMS (unusual diarrhea, constipation, pain near the anus) ?TENDERNESS IN MOUTH AND THROAT WITH OR WITHOUT PRESENCE OF ULCERS (sore throat, sores in mouth, or a toothache) ?UNUSUAL RASH, SWELLING OR PAIN  ?UNUSUAL VAGINAL DISCHARGE OR ITCHING  ? ?Items with * indicate a potential emergency and should be followed up as soon as possible or go to the Emergency Department if any problems should occur. ? ?Please show the CHEMOTHERAPY ALERT CARD or IMMUNOTHERAPY ALERT CARD at check-in to  the Emergency Department and triage nurse. ? ?Should you have questions after your visit or need to cancel or reschedule your appointment, please contact [facility] [place] [facility] [place]  [phone] and follow the prompts.  Office hours are 8:00 a.m. to 4:30 p.m. Monday - Friday. Please note that voicemails left after 4:00 p.m. may not be returned until the following business day.  We are closed weekends and major holidays. You have access to a nurse at all times for urgent questions. Please call the main number to the clinic [phone] and follow the prompts. ? ?For any non-urgent questions, you may also contact your provider using MyChart. We now offer e-Visits for anyone 42 and older to request care online for non-urgent symptoms. For details visit [email or web address]. ?  ?Also download the MyChart app! Go to the app store, search "MyChart", open the app, select [facility], and log in with your MyChart username and password. ? ?Due to Covid, a mask is required upon entering the hospital/clinic. If you do not have a mask, one will be given to you upon arrival. For doctor visits, patients may have 1 support person aged 109 or older with them. For treatment visits, patients cannot have anyone with them due to current Covid guidelines and our immunocompromised population.  ?

## 2022-02-13 NOTE — Progress Notes (Signed)
? ? ? ?Hematology/Oncology Consult note ?[place]  ?Telephone:([phone]) [phone] [phone] ? ?Patient Care Team: ?Default, Provider, MD as PCP - General ?Noreene Filbert, MD as Referring Physician (Radiation Oncology) ?Sindy Guadeloupe, MD as Consulting Physician (Oncology) ?Jules Husbands, MD as Consulting Physician (General Surgery)  ? ?Name of the patient: Paul Hebert  ?[id number]  ?[birth year]  ? ?Date of visit: 02/13/22 ? 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 ?Chief complaint/ Reason for visit-on treatment assessment prior to cycle 1 of weekly cisplatin chemotherapy ? ?Heme/Onc history:  patient is a 51 year old male who has been having ongoing throat pain for over 6 months. Patient states that he presented with these complaints 6 months ago and had a CT soft tissue neck which did not show any malignancy.  He continued to have on and off pain which was gradually getting worse and he presented back to the ER on 09/27/2020.   ?  ?CT soft tissue neck showed a left base of tongue mass measuring 3.4 x 2.6 x 3.3 cm which was invading the tongue along the anterior and medial margin and extending into the left vallecula.  Possible superior and posterior extension to involve the left palatine tonsil the lesion abuts the uvula with early extension into the soft palate.  Multiple left level 2 lymph nodes largest of which was 1.2 cm.  Additional necrotic limited to level 3 nodal conglomerate measures 1.3 cm.  No right-sided adenopathy. ?  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  PET CT scan Showed left base of tongue mass measuring about 2.8 cm with enlarged centrally necrotic level 2/3 left-sided and lymph nodes with an SUV about 3.7.  No hypermetabolic right neck nodes.  No findings of distant metastatic disease ?  ?Patient completed 5 cycles of weekly cisplatin chemotherapy between January to March 2022.  There were treatment interruptions due to  mucositis.  He could not complete 7 cycles eventually.  He did complete his radiation treatment. ?  ?Towards the end of radiation patient was admitted for suicidal ideation.  At the end of chemoradiation patient did not have any evidence of residual disease.  However he was found to have biopsy-proven local recurrence in March 2023.  Plan is for retreatment with cisplatin and radiation  ? ?Interval history-reports feeling anxious before putting on the radiation mask prior to each treatment.  Also reports that his pain at the base of the throat is not currently well controlled with gabapentin. ? ?ECOG PS- 1 ?Pain scale- 5 ?Opioid associated constipation- no ? ?Review of systems- Review of Systems  ?Constitutional:  Negative for chills, fever, malaise/fatigue and weight loss.  ?HENT:  Negative for congestion, ear discharge and nosebleeds.   ?     Pain below the left ear at the back of his throat  ?Eyes:  Negative for blurred vision.  ?Respiratory:  Negative for cough, hemoptysis, sputum production, shortness of breath and wheezing.   ?Cardiovascular:  Negative for chest pain, palpitations, orthopnea and claudication.  ?Gastrointestinal:  Negative for abdominal pain, blood in stool, constipation, diarrhea, heartburn, melena, nausea and vomiting.  ?Genitourinary:  Negative for dysuria, flank pain, frequency, hematuria and urgency.  ?Musculoskeletal:  Negative for back pain, joint pain and myalgias.  ?Skin:  Negative for rash.  ?Neurological:  Negative for dizziness, tingling, focal weakness, seizures, weakness and headaches.  ?Endo/Heme/Allergies:  Does not bruise/bleed easily.  ?Psychiatric/Behavioral:  Negative for depression and suicidal ideas. The patient does not have insomnia.    ? ? ? ?  No Known Allergies ? ? ?Past Medical History:  ?Diagnosis Date  ? GERD (gastroesophageal reflux disease)   ? Tonsil cancer ([place])   ? ? ? ?Past Surgical History:  ?Procedure Laterality Date  ? LARYNGOSCOPY Left 01/24/2022  ?  Procedure: DIRECT LARYNGOSCOPY WITH TONSIL BIOPSY;  Surgeon: Beverly Gust, MD;  Location: [facility];  Service: ENT;  Laterality: Left;  ? PORT-A-CATH REMOVAL N/A 02/04/2021  ? Procedure: REMOVAL PORT-A-CATH;  Surgeon: Olean Ree, MD;  Location: [facility];  Service: General;  Laterality: N/A;  ? PORTA CATH INSERTION N/A 10/25/2020  ? Procedure: PORTA CATH INSERTION;  Surgeon: Algernon Huxley, MD;  Location: [facility];  Service: Cardiovascular;  Laterality: N/A;  ? PORTA CATH INSERTION N/A 02/09/2022  ? Procedure: PORTA CATH INSERTION;  Surgeon: Algernon Huxley, MD;  Location: [facility];  Service: Cardiovascular;  Laterality: N/A;  ? REMOVAL OF GASTROSTOMY TUBE N/A 02/04/2021  ? Procedure: REMOVAL OF GASTROSTOMY TUBE;  Surgeon: Olean Ree, MD;  Location: [facility];  Service: General;  Laterality: N/A;  ? TONGUE BIOPSY Left 01/24/2022  ? Procedure: TONGUE BIOPSY;  Surgeon: Beverly Gust, MD;  Location: [facility];  Service: ENT;  Laterality: Left;  ? ? ?Social History  ? ?Socioeconomic History  ? Marital status: Single  ?  Spouse name: Not on file  ? Number of children: Not on file  ? Years of education: Not on file  ? Highest education level: Not on file  ?Occupational History  ? Not on file  ?Tobacco Use  ? Smoking status: Every Day  ?  Packs/day: 1.50  ?  Years: 35.00  ?  Pack years: 52.50  ?  Types: Cigarettes  ? Smokeless tobacco: Current  ? Tobacco comments:  ?  Patches very rarely  ?Vaping Use  ? Vaping Use: Former  ?Substance and Sexual Activity  ? Alcohol use: Yes  ?  Comment: occ  ? Drug use: Yes  ?  Types: Marijuana, "Crack" cocaine, Methamphetamines  ?  Comment: marijuana every day  ? Sexual activity: Not Currently  ?Other Topics Concern  ? Not on file  ?Social History Narrative  ? Not on file  ? ?Social Determinants of Health  ? ?Financial Resource Strain: Not on file  ?Food Insecurity: Not on file  ?Transportation Needs: Not on file  ?Physical Activity: Not on file  ?Stress: Not on file   ?Social Connections: Not on file  ?Intimate Partner Violence: Not on file  ? ? ?Family History  ?Problem Relation Age of Onset  ? Cancer Father   ? ? ? ?Current Outpatient Medications:  ?  calcium carbonate (TUMS - DOSED IN MG ELEMENTAL CALCIUM) 500 MG chewable tablet, Chew 1 tablet by mouth as needed for indigestion or heartburn., Disp: , Rfl:  ?  dexamethasone (DECADRON) 4 MG tablet, Take 2 tablets (8 mg total) by mouth daily. Take daily x 3 days starting the day after cisplatin chemotherapy. Take with food., Disp: 30 tablet, Rfl: 1 ?  gabapentin (NEURONTIN) 300 MG capsule, Take 1 capsule (300 mg total) by mouth 3 (three) times daily., Disp: 90 capsule, Rfl: 1 ?  lidocaine-prilocaine (EMLA) cream, Apply to affected area once, Disp: 30 g, Rfl: 3 ?  LORazepam (ATIVAN) 0.5 MG tablet, Take 1 tablet (0.5 mg total) by mouth every 6 (six) hours as needed (Nausea or vomiting)., Disp: 30 tablet, Rfl: 0 ?  naproxen (NAPROSYN) 500 MG tablet, Take 1 tablet (500 mg total) by mouth 2 (two) times  daily with a meal. (Patient not taking: Reported on 02/01/2022), Disp: 20 tablet, Rfl: 0 ?  ondansetron (ZOFRAN) 8 MG tablet, Take 1 tablet (8 mg total) by mouth 2 (two) times daily as needed. Start on the third day after cisplatin chemotherapy., Disp: 30 tablet, Rfl: 1 ?  prochlorperazine (COMPAZINE) 10 MG tablet, Take 1 tablet (10 mg total) by mouth every 6 (six) hours as needed (Nausea or vomiting)., Disp: 30 tablet, Rfl: 1 ? ?Physical exam:  ?Vitals:  ? 02/13/22 0847  ?BP: 130/89  ?Pulse: 79  ?Resp: 16  ?Temp: (!) 96.7 ?F (35.9 ?C)  ?SpO2: 96%  ?Weight: 148 lb 3.2 oz (67.2 kg)  ? ?Physical Exam ?Constitutional:   ?   General: He is not in acute distress. ?Cardiovascular:  ?   Rate and Rhythm: Normal rate and regular rhythm.  ?   Heart sounds: Normal heart sounds.  ?Pulmonary:  ?   Effort: Pulmonary effort is normal.  ?   Breath sounds: Normal breath sounds.  ?Abdominal:  ?   General: Bowel sounds are normal.  ?   Palpations: Abdomen  is soft.  ?Skin: ?   General: Skin is warm and dry.  ?Neurological:  ?   Mental Status: He is alert and oriented to person, place, and time.  ?  ? ? ?  Latest Ref Rng & Units 02/13/2022  ?  8:11 AM  ?CMP  ?Glu

## 2022-02-14 ENCOUNTER — Other Ambulatory Visit: Payer: Self-pay

## 2022-02-14 ENCOUNTER — Ambulatory Visit
Admission: RE | Admit: 2022-02-14 | Discharge: 2022-02-14 | Disposition: A | Discharge status: Home / Self Care | Payer: Medicaid Other | Source: Ambulatory Visit | Attending: Radiation Oncology | Admitting: Radiation Oncology

## 2022-02-14 DIAGNOSIS — C01 Malignant neoplasm of base of tongue: Secondary | ICD-10-CM | POA: Diagnosis not present

## 2022-02-14 LAB — RAD ONC ARIA SESSION SUMMARY
Course Elapsed Days: 1
Plan Fractions Treated to Date: 2
Plan Prescribed Dose Per Fraction: 2 Gy
Plan Total Fractions Prescribed: 30
Plan Total Prescribed Dose: 60 Gy
Reference Point Dosage Given to Date: 4 Gy
Reference Point Session Dosage Given: 2 Gy
Session Number: 2

## 2022-02-15 ENCOUNTER — Other Ambulatory Visit: Payer: Self-pay | Admitting: *Deleted

## 2022-02-15 ENCOUNTER — Ambulatory Visit
Admission: RE | Admit: 2022-02-15 | Discharge: 2022-02-15 | Disposition: A | Discharge status: Home / Self Care | Payer: Medicaid Other | Source: Ambulatory Visit | Attending: Radiation Oncology | Admitting: Radiation Oncology

## 2022-02-15 ENCOUNTER — Other Ambulatory Visit: Payer: Self-pay

## 2022-02-15 DIAGNOSIS — C01 Malignant neoplasm of base of tongue: Secondary | ICD-10-CM | POA: Diagnosis not present

## 2022-02-15 LAB — RAD ONC ARIA SESSION SUMMARY
Course Elapsed Days: 2
Plan Fractions Treated to Date: 3
Plan Prescribed Dose Per Fraction: 2 Gy
Plan Total Fractions Prescribed: 30
Plan Total Prescribed Dose: 60 Gy
Reference Point Dosage Given to Date: 6 Gy
Reference Point Session Dosage Given: 2 Gy
Session Number: 3

## 2022-02-15 MED ORDER — LANSOPRAZOLE 30 MG PO CPDR: 30.0000 mg | DELAYED_RELEASE_CAPSULE | Freq: Every day | ORAL | 3 refills | Status: DC

## 2022-02-16 ENCOUNTER — Other Ambulatory Visit: Payer: Self-pay

## 2022-02-16 ENCOUNTER — Ambulatory Visit
Admission: RE | Admit: 2022-02-16 | Discharge: 2022-02-16 | Disposition: A | Discharge status: Home / Self Care | Payer: Medicaid Other | Source: Ambulatory Visit | Attending: Radiation Oncology | Admitting: Radiation Oncology

## 2022-02-16 DIAGNOSIS — C01 Malignant neoplasm of base of tongue: Secondary | ICD-10-CM | POA: Diagnosis not present

## 2022-02-16 LAB — RAD ONC ARIA SESSION SUMMARY
Course Elapsed Days: 3
Plan Fractions Treated to Date: 4
Plan Prescribed Dose Per Fraction: 2 Gy
Plan Total Fractions Prescribed: 30
Plan Total Prescribed Dose: 60 Gy
Reference Point Dosage Given to Date: 8 Gy
Reference Point Session Dosage Given: 2 Gy
Session Number: 4

## 2022-02-17 ENCOUNTER — Ambulatory Visit
Admission: RE | Admit: 2022-02-17 | Discharge: 2022-02-17 | Disposition: A | Discharge status: Home / Self Care | Payer: Medicaid Other | Source: Ambulatory Visit | Attending: Radiation Oncology | Admitting: Radiation Oncology

## 2022-02-17 ENCOUNTER — Other Ambulatory Visit: Payer: Self-pay

## 2022-02-17 DIAGNOSIS — C01 Malignant neoplasm of base of tongue: Secondary | ICD-10-CM | POA: Diagnosis not present

## 2022-02-17 LAB — RAD ONC ARIA SESSION SUMMARY
Course Elapsed Days: 4
Plan Fractions Treated to Date: 5
Plan Prescribed Dose Per Fraction: 2 Gy
Plan Total Fractions Prescribed: 30
Plan Total Prescribed Dose: 60 Gy
Reference Point Dosage Given to Date: 10 Gy
Reference Point Session Dosage Given: 2 Gy
Session Number: 5

## 2022-02-20 ENCOUNTER — Ambulatory Visit
Admission: RE | Admit: 2022-02-20 | Discharge: 2022-02-20 | Disposition: A | Discharge status: Home / Self Care | Payer: Medicaid Other | Source: Ambulatory Visit | Attending: Radiation Oncology | Admitting: Radiation Oncology

## 2022-02-20 ENCOUNTER — Other Ambulatory Visit: Payer: Medicaid Other

## 2022-02-20 ENCOUNTER — Other Ambulatory Visit: Payer: Self-pay

## 2022-02-20 ENCOUNTER — Encounter: Payer: Self-pay | Admitting: Oncology

## 2022-02-20 ENCOUNTER — Inpatient Hospital Stay: Payer: Medicaid Other

## 2022-02-20 ENCOUNTER — Inpatient Hospital Stay (HOSPITAL_BASED_OUTPATIENT_CLINIC_OR_DEPARTMENT_OTHER): Payer: Medicaid Other | Admitting: Oncology

## 2022-02-20 ENCOUNTER — Telehealth: Payer: Self-pay | Admitting: *Deleted

## 2022-02-20 ENCOUNTER — Ambulatory Visit: Payer: Medicaid Other

## 2022-02-20 VITALS — BP 131/86 | HR 96 | Temp 98.0°F | Resp 18 | Wt 146.0 lb

## 2022-02-20 DIAGNOSIS — C109 Malignant neoplasm of oropharynx, unspecified: Secondary | ICD-10-CM | POA: Diagnosis not present

## 2022-02-20 DIAGNOSIS — G893 Neoplasm related pain (acute) (chronic): Secondary | ICD-10-CM

## 2022-02-20 DIAGNOSIS — C01 Malignant neoplasm of base of tongue: Secondary | ICD-10-CM | POA: Diagnosis not present

## 2022-02-20 DIAGNOSIS — Z5111 Encounter for antineoplastic chemotherapy: Secondary | ICD-10-CM

## 2022-02-20 LAB — RAD ONC ARIA SESSION SUMMARY
Course Elapsed Days: 7
Plan Fractions Treated to Date: 6
Plan Prescribed Dose Per Fraction: 2 Gy
Plan Total Fractions Prescribed: 30
Plan Total Prescribed Dose: 60 Gy
Reference Point Dosage Given to Date: 12 Gy
Reference Point Session Dosage Given: 2 Gy
Session Number: 6

## 2022-02-20 LAB — CBC WITH DIFFERENTIAL/PLATELET
Abs Immature Granulocytes: 0.02 10*3/uL (ref 0.00–0.07)
Basophils Absolute: 0 10*3/uL (ref 0.0–0.1)
Basophils Relative: 0 %
Eosinophils Absolute: 0.2 10*3/uL (ref 0.0–0.5)
Eosinophils Relative: 2 %
HCT: 46.6 % (ref 39.0–52.0)
Hemoglobin: 17.1 g/dL — ABNORMAL HIGH (ref 13.0–17.0)
Immature Granulocytes: 0 %
Lymphocytes Relative: 26 %
Lymphs Abs: 1.6 10*3/uL (ref 0.7–4.0)
MCH: 33.4 pg (ref 26.0–34.0)
MCHC: 36.7 g/dL — ABNORMAL HIGH (ref 30.0–36.0)
MCV: 91 fL (ref 80.0–100.0)
Monocytes Absolute: 0.6 10*3/uL (ref 0.1–1.0)
Monocytes Relative: 9 %
Neutro Abs: 3.9 10*3/uL (ref 1.7–7.7)
Neutrophils Relative %: 63 %
Platelets: 278 10*3/uL (ref 150–400)
RBC: 5.12 MIL/uL (ref 4.22–5.81)
RDW: 12.2 % (ref 11.5–15.5)
WBC: 6.3 10*3/uL (ref 4.0–10.5)
nRBC: 0 % (ref 0.0–0.2)

## 2022-02-20 LAB — COMPREHENSIVE METABOLIC PANEL
ALT: 14 U/L (ref 0–44)
AST: 20 U/L (ref 15–41)
Albumin: 4.7 g/dL (ref 3.5–5.0)
Alkaline Phosphatase: 67 U/L (ref 38–126)
Anion gap: 9 (ref 5–15)
BUN: 15 mg/dL (ref 6–20)
CO2: 26 mmol/L (ref 22–32)
Calcium: 9.3 mg/dL (ref 8.9–10.3)
Chloride: 97 mmol/L — ABNORMAL LOW (ref 98–111)
Creatinine, Ser: 0.9 mg/dL (ref 0.61–1.24)
GFR, Estimated: >60 mL/min (ref >60)
Glucose, Bld: 114 mg/dL — ABNORMAL HIGH (ref 70–99)
Potassium: 4 mmol/L (ref 3.5–5.1)
Sodium: 132 mmol/L — ABNORMAL LOW (ref 135–145)
Total Bilirubin: 0.8 mg/dL (ref 0.3–1.2)
Total Protein: 8 g/dL (ref 6.5–8.1)

## 2022-02-20 LAB — MAGNESIUM: Magnesium: 2.1 mg/dL (ref 1.7–2.4)

## 2022-02-20 MED ORDER — SODIUM CHLORIDE 0.9 % IV SOLN
10.0000 mg | Freq: Once | INTRAVENOUS | Status: AC
  Administered 2022-02-20: 10 mg via INTRAVENOUS
  Filled 2022-02-20: qty 10

## 2022-02-20 MED ORDER — SODIUM CHLORIDE 0.9 % IV SOLN
40.0000 mg/m2 | Freq: Once | INTRAVENOUS | Status: AC
  Administered 2022-02-20: 73 mg via INTRAVENOUS
  Filled 2022-02-20: qty 73

## 2022-02-20 MED ORDER — MAGNESIUM SULFATE 2 GM/50ML IV SOLN
2.0000 g | Freq: Once | INTRAVENOUS | Status: AC
  Administered 2022-02-20: 2 g via INTRAVENOUS
  Filled 2022-02-20: qty 50

## 2022-02-20 MED ORDER — SODIUM CHLORIDE 0.9 % IV SOLN
150.0000 mg | Freq: Once | INTRAVENOUS | Status: AC
  Administered 2022-02-20: 150 mg via INTRAVENOUS
  Filled 2022-02-20: qty 150

## 2022-02-20 MED ORDER — HEPARIN SOD (PORK) LOCK FLUSH 100 UNIT/ML IV SOLN
500.0000 [IU] | Freq: Once | INTRAVENOUS | Status: AC | PRN
  Administered 2022-02-20: 500 [IU]
  Filled 2022-02-20: qty 5

## 2022-02-20 MED ORDER — SODIUM CHLORIDE 0.9 % IV SOLN
Freq: Once | INTRAVENOUS | Status: AC
  Filled 2022-02-20: qty 250

## 2022-02-20 MED ORDER — PALONOSETRON HCL INJECTION 0.25 MG/5ML
0.2500 mg | Freq: Once | INTRAVENOUS | Status: AC
  Administered 2022-02-20: 0.25 mg via INTRAVENOUS
  Filled 2022-02-20: qty 5

## 2022-02-20 MED ORDER — POTASSIUM CHLORIDE IN NACL 20-0.9 MEQ/L-% IV SOLN
Freq: Once | INTRAVENOUS | Status: AC
  Filled 2022-02-20: qty 1000

## 2022-02-20 NOTE — Progress Notes (Signed)
Pt states that he had a pretty rough weekend; was fine on Friday but over Sturday and Sunday was in the bed all day and was not able to eat at all due to nausea and feelings achy.Did take zofran and compazine; slept over 30 hours over the weekend which is very unlike him.  ?

## 2022-02-20 NOTE — Progress Notes (Signed)
Patient tolerated CISplatin infusion well, no questions/concerns voiced. Potassium and Magnesium IV also given. Patient stable at discharge. AVS given.   ?

## 2022-02-20 NOTE — Progress Notes (Signed)
? ? ? ?Hematology/Oncology Consult note ?[place]  ?Telephone:([phone]) [phone] [phone] ? ?Patient Care Team: ?Default, Provider, MD as PCP - General ?Noreene Filbert, MD as Referring Physician (Radiation Oncology) ?Sindy Guadeloupe, MD as Consulting Physician (Oncology) ?Jules Husbands, MD as Consulting Physician (General Surgery)  ? ?Name of the patient: Paul Hebert  ?[id number]  ?[birth year]  ? ?Date of visit: 02/20/22 ? 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 ?Chief complaint/ Reason for visit-on treatment assessment prior to cycle 2 of weekly cisplatin chemotherapy ? ?Heme/Onc history:  patient is a 51 year old male who has been having ongoing throat pain for over 6 months. Patient states that he presented with these complaints 6 months ago and had a CT soft tissue neck which did not show any malignancy.  He continued to have on and off pain which was gradually getting worse and he presented back to the ER on 09/27/2020.   ?  ?CT soft tissue neck showed a left base of tongue mass measuring 3.4 x 2.6 x 3.3 cm which was invading the tongue along the anterior and medial margin and extending into the left vallecula.  Possible superior and posterior extension to involve the left palatine tonsil the lesion abuts the uvula with early extension into the soft palate.  Multiple left level 2 lymph nodes largest of which was 1.2 cm.  Additional necrotic limited to level 3 nodal conglomerate measures 1.3 cm.  No right-sided adenopathy. ?  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  PET CT scan Showed left base of tongue mass measuring about 2.8 cm with enlarged centrally necrotic level 2/3 left-sided and lymph nodes with an SUV about 3.7.  No hypermetabolic right neck nodes.  No findings of distant metastatic disease ?  ?Patient completed 5 cycles of weekly cisplatin chemotherapy between January to March 2022.  There were treatment interruptions due to  mucositis.  He could not complete 7 cycles eventually.  He did complete his radiation treatment. ?  ?Towards the end of radiation patient was admitted for suicidal ideation.  At the end of chemoradiation patient did not have any evidence of residual disease.  However he was found to have biopsy-proven local recurrence in March 2023.  Plan is for retreatment with cisplatin and radiation  ? ?Interval history-tolerating radiation well so far.  Pain is under relatively good control with gabapentin and as needed oxycodone.  He is also using 1 dose of Ativan before he comes for his radiation to prevent anxiety from the mask and that has been working well for him.  Oral intake could be better.  Yesterday he only had 1 boiled egg and bowl of oatmeal.  He continues to refuse feeding tube ? ?ECOG PS- 1 ?Pain scale- 3 ?Opioid associated constipation- no ? ?Review of systems- Review of Systems  ?Constitutional:  Negative for chills, fever, malaise/fatigue and weight loss.  ?HENT:  Negative for congestion, ear discharge and nosebleeds.   ?     Throat pain at the back of throat  ?Eyes:  Negative for blurred vision.  ?Respiratory:  Negative for cough, hemoptysis, sputum production, shortness of breath and wheezing.   ?Cardiovascular:  Negative for chest pain, palpitations, orthopnea and claudication.  ?Gastrointestinal:  Negative for abdominal pain, blood in stool, constipation, diarrhea, heartburn, melena, nausea and vomiting.  ?Genitourinary:  Negative for dysuria, flank pain, frequency, hematuria and urgency.  ?Musculoskeletal:  Negative for back pain, joint pain and myalgias.  ?Skin:  Negative for rash.  ?  Neurological:  Negative for dizziness, tingling, focal weakness, seizures, weakness and headaches.  ?Endo/Heme/Allergies:  Does not bruise/bleed easily.  ?Psychiatric/Behavioral:  Negative for depression and suicidal ideas. The patient does not have insomnia.    ? ? ? ?No Known Allergies ? ? ?Past Medical History:  ?Diagnosis  Date  ? GERD (gastroesophageal reflux disease)   ? Tonsil cancer ([place])   ? ? ? ?Past Surgical History:  ?Procedure Laterality Date  ? LARYNGOSCOPY Left 01/24/2022  ? Procedure: DIRECT LARYNGOSCOPY WITH TONSIL BIOPSY;  Surgeon: Beverly Gust, MD;  Location: [facility];  Service: ENT;  Laterality: Left;  ? PORT-A-CATH REMOVAL N/A 02/04/2021  ? Procedure: REMOVAL PORT-A-CATH;  Surgeon: Olean Ree, MD;  Location: [facility];  Service: General;  Laterality: N/A;  ? PORTA CATH INSERTION N/A 10/25/2020  ? Procedure: PORTA CATH INSERTION;  Surgeon: Algernon Huxley, MD;  Location: [facility];  Service: Cardiovascular;  Laterality: N/A;  ? PORTA CATH INSERTION N/A 02/09/2022  ? Procedure: PORTA CATH INSERTION;  Surgeon: Algernon Huxley, MD;  Location: [facility];  Service: Cardiovascular;  Laterality: N/A;  ? REMOVAL OF GASTROSTOMY TUBE N/A 02/04/2021  ? Procedure: REMOVAL OF GASTROSTOMY TUBE;  Surgeon: Olean Ree, MD;  Location: [facility];  Service: General;  Laterality: N/A;  ? TONGUE BIOPSY Left 01/24/2022  ? Procedure: TONGUE BIOPSY;  Surgeon: Beverly Gust, MD;  Location: [facility];  Service: ENT;  Laterality: Left;  ? ? ?Social History  ? ?Socioeconomic History  ? Marital status: Single  ?  Spouse name: Not on file  ? Number of children: Not on file  ? Years of education: Not on file  ? Highest education level: Not on file  ?Occupational History  ? Not on file  ?Tobacco Use  ? Smoking status: Every Day  ?  Packs/day: 1.50  ?  Years: 35.00  ?  Pack years: 52.50  ?  Types: Cigarettes  ? Smokeless tobacco: Current  ? Tobacco comments:  ?  Patches very rarely  ?Vaping Use  ? Vaping Use: Former  ?Substance and Sexual Activity  ? Alcohol use: Yes  ?  Comment: occ  ? Drug use: Yes  ?  Types: Marijuana, "Crack" cocaine, Methamphetamines  ?  Comment: marijuana every day  ? Sexual activity: Not Currently  ?Other Topics Concern  ? Not on file  ?Social History Narrative  ? Not on file  ? ?Social Determinants of Health   ? ?Financial Resource Strain: Not on file  ?Food Insecurity: Not on file  ?Transportation Needs: Not on file  ?Physical Activity: Not on file  ?Stress: Not on file  ?Social Connections: Not on file  ?Intimate Partner Violence: Not on file  ? ? ?Family History  ?Problem Relation Age of Onset  ? Cancer Father   ? ? ? ?Current Outpatient Medications:  ?  calcium carbonate (TUMS - DOSED IN MG ELEMENTAL CALCIUM) 500 MG chewable tablet, Chew 1 tablet by mouth as needed for indigestion or heartburn. (Patient not taking: Reported on 02/13/2022), Disp: , Rfl:  ?  dexamethasone (DECADRON) 4 MG tablet, Take 2 tablets (8 mg total) by mouth daily. Take daily x 3 days starting the day after cisplatin chemotherapy. Take with food. (Patient not taking: Reported on 02/13/2022), Disp: 30 tablet, Rfl: 1 ?  gabapentin (NEURONTIN) 300 MG capsule, Take 1 capsule (300 mg total) by mouth 3 (three) times daily., Disp: 90 capsule, Rfl: 1 ?  lansoprazole (PREVACID) 30 MG capsule, Take 1 capsule (  30 mg total) by mouth daily at 12 noon., Disp: 90 capsule, Rfl: 3 ?  lidocaine-prilocaine (EMLA) cream, Apply to affected area once, Disp: 30 g, Rfl: 3 ?  LORazepam (ATIVAN) 0.5 MG tablet, Take 1 tablet (0.5 mg total) by mouth daily. Once a day prior to each radiation treatment, Disp: 30 tablet, Rfl: 0 ?  naproxen (NAPROSYN) 500 MG tablet, Take 1 tablet (500 mg total) by mouth 2 (two) times daily with a meal. (Patient not taking: Reported on 02/01/2022), Disp: 20 tablet, Rfl: 0 ?  ondansetron (ZOFRAN) 8 MG tablet, Take 1 tablet (8 mg total) by mouth 2 (two) times daily as needed. Start on the third day after cisplatin chemotherapy. (Patient not taking: Reported on 02/13/2022), Disp: 30 tablet, Rfl: 1 ?  oxyCODONE (OXY IR/ROXICODONE) 5 MG immediate release tablet, Take 1 tablet (5 mg total) by mouth every 8 (eight) hours as needed for severe pain., Disp: 90 tablet, Rfl: 0 ?  prochlorperazine (COMPAZINE) 10 MG tablet, Take 1 tablet (10 mg total) by mouth  every 6 (six) hours as needed (Nausea or vomiting). (Patient not taking: Reported on 02/13/2022), Disp: 30 tablet, Rfl: 1 ? ?Physical exam:  ?Vitals:  ? 02/20/22 0849  ?BP: 131/86  ?Pulse: 96  ?Resp: 18

## 2022-02-20 NOTE — Telephone Encounter (Signed)
[facility] called to inform us that Viscous Lidocaine is on National backorder and do not know when it will be available, so they cannot fill Magic Mouth Wash prescription sent to them. Shirlean Mylar said there is a pharmacy in [place] that makes their own called [facility] [phone], but unsure how that would work since this is a [employer] prescription. I have called [facility] and they only have 60 ml of viscous lidocaine and also report it is on backorder. Please advise ?

## 2022-02-20 NOTE — Telephone Encounter (Signed)
Nothing we can do in that case

## 2022-02-20 NOTE — Progress Notes (Signed)
Informed MD, patient only voided 138m prior to Cisplatin, per MD ok to proceed  ?

## 2022-02-20 NOTE — Patient Instructions (Signed)
[facility] AT [place]  Discharge Instructions: ?Thank you for choosing [facility] to provide your oncology and hematology care.  ?If you have a lab appointment with the [facility], please go directly to the [facility] and check in at the registration area. ? ?Wear comfortable clothing and clothing appropriate for easy access to any Portacath or PICC line.  ? ?We strive to give you quality time with your provider. You may need to reschedule your appointment if you arrive late (15 or more minutes).  Arriving late affects you and other patients whose appointments are after yours.  Also, if you miss three or more appointments without notifying the office, you may be dismissed from the clinic at the provider?s discretion.    ?  ?For prescription refill requests, have your pharmacy contact our office and allow 72 hours for refills to be completed.   ? ?Today you received the following chemotherapy and/or immunotherapy agents: CISplatin, Magnesium, Potassium ?  ?To help prevent nausea and vomiting after your treatment, we encourage you to take your nausea medication as directed. ? ?BELOW ARE SYMPTOMS THAT SHOULD BE REPORTED IMMEDIATELY: ?*FEVER GREATER THAN 100.4 F (38 ?C) OR HIGHER ?*CHILLS OR SWEATING ?*NAUSEA AND VOMITING THAT IS NOT CONTROLLED WITH YOUR NAUSEA MEDICATION ?*UNUSUAL SHORTNESS OF BREATH ?*UNUSUAL BRUISING OR BLEEDING ?*URINARY PROBLEMS (pain or burning when urinating, or frequent urination) ?*BOWEL PROBLEMS (unusual diarrhea, constipation, pain near the anus) ?TENDERNESS IN MOUTH AND THROAT WITH OR WITHOUT PRESENCE OF ULCERS (sore throat, sores in mouth, or a toothache) ?UNUSUAL RASH, SWELLING OR PAIN  ?UNUSUAL VAGINAL DISCHARGE OR ITCHING  ? ?Items with * indicate a potential emergency and should be followed up as soon as possible or go to the Emergency Department if any problems should occur. ? ?Please show the CHEMOTHERAPY ALERT CARD or IMMUNOTHERAPY ALERT  CARD at check-in to the Emergency Department and triage nurse. ? ?Should you have questions after your visit or need to cancel or reschedule your appointment, please contact [facility] [place] AT [place]  [phone] and follow the prompts.  Office hours are 8:00 a.m. to 4:30 p.m. Monday - Friday. Please note that voicemails left after 4:00 p.m. may not be returned until the following business day.  We are closed weekends and major holidays. You have access to a nurse at all times for urgent questions. Please call the main number to the clinic [phone] and follow the prompts. ? ?For any non-urgent questions, you may also contact your provider using MyChart. We now offer e-Visits for anyone 40 and older to request care online for non-urgent symptoms. For details visit [email or web address]. ?  ?Also download the MyChart app! Go to the app store, search "MyChart", open the app, select [facility], and log in with your MyChart username and password. ? ?Due to Covid, a mask is required upon entering the hospital/clinic. If you do not have a mask, one will be given to you upon arrival. For doctor visits, patients may have 1 support person aged 70 or older with them. For treatment visits, patients cannot have anyone with them due to current Covid guidelines and our immunocompromised population.  ?

## 2022-02-21 ENCOUNTER — Ambulatory Visit
Admission: RE | Admit: 2022-02-21 | Discharge: 2022-02-21 | Disposition: A | Discharge status: Home / Self Care | Payer: Medicaid Other | Source: Ambulatory Visit | Attending: Radiation Oncology | Admitting: Radiation Oncology

## 2022-02-21 ENCOUNTER — Other Ambulatory Visit: Payer: Self-pay

## 2022-02-21 DIAGNOSIS — C01 Malignant neoplasm of base of tongue: Secondary | ICD-10-CM | POA: Diagnosis not present

## 2022-02-21 LAB — RAD ONC ARIA SESSION SUMMARY
Course Elapsed Days: 8
Plan Fractions Treated to Date: 7
Plan Prescribed Dose Per Fraction: 2 Gy
Plan Total Fractions Prescribed: 30
Plan Total Prescribed Dose: 60 Gy
Reference Point Dosage Given to Date: 14 Gy
Reference Point Session Dosage Given: 2 Gy
Session Number: 7

## 2022-02-22 ENCOUNTER — Ambulatory Visit
Admission: RE | Admit: 2022-02-22 | Discharge: 2022-02-22 | Disposition: A | Discharge status: Home / Self Care | Payer: Medicaid Other | Source: Ambulatory Visit | Attending: Radiation Oncology | Admitting: Radiation Oncology

## 2022-02-22 ENCOUNTER — Other Ambulatory Visit: Payer: Self-pay

## 2022-02-22 DIAGNOSIS — C01 Malignant neoplasm of base of tongue: Secondary | ICD-10-CM | POA: Diagnosis not present

## 2022-02-22 LAB — RAD ONC ARIA SESSION SUMMARY
Course Elapsed Days: 9
Plan Fractions Treated to Date: 8
Plan Prescribed Dose Per Fraction: 2 Gy
Plan Total Fractions Prescribed: 30
Plan Total Prescribed Dose: 60 Gy
Reference Point Dosage Given to Date: 16 Gy
Reference Point Session Dosage Given: 2 Gy
Session Number: 8

## 2022-02-23 ENCOUNTER — Other Ambulatory Visit: Payer: Self-pay

## 2022-02-23 ENCOUNTER — Ambulatory Visit
Admission: RE | Admit: 2022-02-23 | Discharge: 2022-02-23 | Disposition: A | Discharge status: Home / Self Care | Payer: Medicaid Other | Source: Ambulatory Visit | Attending: Radiation Oncology | Admitting: Radiation Oncology

## 2022-02-23 DIAGNOSIS — C01 Malignant neoplasm of base of tongue: Secondary | ICD-10-CM | POA: Diagnosis not present

## 2022-02-23 LAB — RAD ONC ARIA SESSION SUMMARY
Course Elapsed Days: 10
Plan Fractions Treated to Date: 9
Plan Prescribed Dose Per Fraction: 2 Gy
Plan Total Fractions Prescribed: 30
Plan Total Prescribed Dose: 60 Gy
Reference Point Dosage Given to Date: 18 Gy
Reference Point Session Dosage Given: 2 Gy
Session Number: 9

## 2022-02-24 ENCOUNTER — Ambulatory Visit
Admission: RE | Admit: 2022-02-24 | Discharge: 2022-02-24 | Disposition: A | Discharge status: Home / Self Care | Payer: Medicaid Other | Source: Ambulatory Visit | Attending: Radiation Oncology | Admitting: Radiation Oncology

## 2022-02-24 ENCOUNTER — Telehealth: Payer: Self-pay | Admitting: *Deleted

## 2022-02-24 ENCOUNTER — Other Ambulatory Visit: Payer: Self-pay

## 2022-02-24 DIAGNOSIS — C01 Malignant neoplasm of base of tongue: Secondary | ICD-10-CM | POA: Diagnosis not present

## 2022-02-24 LAB — RAD ONC ARIA SESSION SUMMARY
Course Elapsed Days: 11
Plan Fractions Treated to Date: 10
Plan Prescribed Dose Per Fraction: 2 Gy
Plan Total Fractions Prescribed: 30
Plan Total Prescribed Dose: 60 Gy
Reference Point Dosage Given to Date: 20 Gy
Reference Point Session Dosage Given: 2 Gy
Session Number: 10

## 2022-02-24 MED FILL — Dexamethasone Sodium Phosphate Inj 100 MG/10ML: INTRAMUSCULAR | Qty: 1 | Status: AC

## 2022-02-24 MED FILL — Fosaprepitant Dimeglumine For IV Infusion 150 MG (Base Eq): INTRAVENOUS | Qty: 5 | Status: AC

## 2022-02-24 NOTE — Telephone Encounter (Signed)
Called and spoke with patient after he had asked about magic mouthwash order not being sent to pharmacy after seeing Dr Janese Banks on Feb 20, 2022.  RN called patient and explained that the reason prescription was not able to be filled by [facility] was because there is a National backorder on the viscous lidocaine needed to make the solution. Pt verbalized understanding.  ?

## 2022-02-27 ENCOUNTER — Telehealth: Payer: Self-pay

## 2022-02-27 ENCOUNTER — Other Ambulatory Visit: Payer: Self-pay

## 2022-02-27 ENCOUNTER — Ambulatory Visit
Admission: RE | Admit: 2022-02-27 | Discharge: 2022-02-27 | Disposition: A | Discharge status: Home / Self Care | Payer: Medicaid Other | Source: Ambulatory Visit | Attending: Radiation Oncology | Admitting: Radiation Oncology

## 2022-02-27 ENCOUNTER — Inpatient Hospital Stay: Payer: Medicaid Other

## 2022-02-27 VITALS — BP 124/99 | HR 78 | Temp 98.1°F | Resp 19

## 2022-02-27 DIAGNOSIS — C109 Malignant neoplasm of oropharynx, unspecified: Secondary | ICD-10-CM

## 2022-02-27 DIAGNOSIS — C01 Malignant neoplasm of base of tongue: Secondary | ICD-10-CM | POA: Diagnosis not present

## 2022-02-27 LAB — RAD ONC ARIA SESSION SUMMARY
Course Elapsed Days: 14
Plan Fractions Treated to Date: 11
Plan Prescribed Dose Per Fraction: 2 Gy
Plan Total Fractions Prescribed: 30
Plan Total Prescribed Dose: 60 Gy
Reference Point Dosage Given to Date: 22 Gy
Reference Point Session Dosage Given: 2 Gy
Session Number: 11

## 2022-02-27 LAB — CBC WITH DIFFERENTIAL/PLATELET
Abs Immature Granulocytes: 0.02 10*3/uL (ref 0.00–0.07)
Basophils Absolute: 0 10*3/uL (ref 0.0–0.1)
Basophils Relative: 1 %
Eosinophils Absolute: 0.1 10*3/uL (ref 0.0–0.5)
Eosinophils Relative: 1 %
HCT: 41.3 % (ref 39.0–52.0)
Hemoglobin: 15 g/dL (ref 13.0–17.0)
Immature Granulocytes: 0 %
Lymphocytes Relative: 17 %
Lymphs Abs: 1.3 10*3/uL (ref 0.7–4.0)
MCH: 33.5 pg (ref 26.0–34.0)
MCHC: 36.3 g/dL — ABNORMAL HIGH (ref 30.0–36.0)
MCV: 92.2 fL (ref 80.0–100.0)
Monocytes Absolute: 0.5 10*3/uL (ref 0.1–1.0)
Monocytes Relative: 7 %
Neutro Abs: 5.6 10*3/uL (ref 1.7–7.7)
Neutrophils Relative %: 74 %
Platelets: 231 10*3/uL (ref 150–400)
RBC: 4.48 MIL/uL (ref 4.22–5.81)
RDW: 12.4 % (ref 11.5–15.5)
WBC: 7.6 10*3/uL (ref 4.0–10.5)
nRBC: 0 % (ref 0.0–0.2)

## 2022-02-27 LAB — COMPREHENSIVE METABOLIC PANEL
ALT: 15 U/L (ref 0–44)
AST: 20 U/L (ref 15–41)
Albumin: 4.1 g/dL (ref 3.5–5.0)
Alkaline Phosphatase: 65 U/L (ref 38–126)
Anion gap: 9 (ref 5–15)
BUN: 15 mg/dL (ref 6–20)
CO2: 25 mmol/L (ref 22–32)
Calcium: 9 mg/dL (ref 8.9–10.3)
Chloride: 100 mmol/L (ref 98–111)
Creatinine, Ser: 0.87 mg/dL (ref 0.61–1.24)
GFR, Estimated: >60 mL/min (ref >60)
Glucose, Bld: 96 mg/dL (ref 70–99)
Potassium: 3.8 mmol/L (ref 3.5–5.1)
Sodium: 134 mmol/L — ABNORMAL LOW (ref 135–145)
Total Bilirubin: 0.4 mg/dL (ref 0.3–1.2)
Total Protein: 7.1 g/dL (ref 6.5–8.1)

## 2022-02-27 LAB — MAGNESIUM: Magnesium: 1.9 mg/dL (ref 1.7–2.4)

## 2022-02-27 MED ORDER — ACETAMINOPHEN 325 MG PO TABS
650.0000 mg | ORAL_TABLET | Freq: Once | ORAL | Status: AC
  Administered 2022-02-27: 650 mg via ORAL
  Filled 2022-02-27: qty 2

## 2022-02-27 MED ORDER — SODIUM CHLORIDE 0.9 % IV SOLN
40.0000 mg/m2 | Freq: Once | INTRAVENOUS | Status: AC
  Administered 2022-02-27: 73 mg via INTRAVENOUS
  Filled 2022-02-27: qty 73

## 2022-02-27 MED ORDER — PALONOSETRON HCL INJECTION 0.25 MG/5ML
0.2500 mg | Freq: Once | INTRAVENOUS | Status: AC
  Administered 2022-02-27: 0.25 mg via INTRAVENOUS
  Filled 2022-02-27: qty 5

## 2022-02-27 MED ORDER — SODIUM CHLORIDE 0.9 % IV SOLN
Freq: Once | INTRAVENOUS | Status: AC
  Filled 2022-02-27: qty 250

## 2022-02-27 MED ORDER — SODIUM CHLORIDE 0.9 % IV SOLN
10.0000 mg | Freq: Once | INTRAVENOUS | Status: AC
  Administered 2022-02-27: 10 mg via INTRAVENOUS
  Filled 2022-02-27: qty 10

## 2022-02-27 MED ORDER — MAGNESIUM SULFATE 2 GM/50ML IV SOLN
2.0000 g | Freq: Once | INTRAVENOUS | Status: AC
  Administered 2022-02-27: 2 g via INTRAVENOUS
  Filled 2022-02-27: qty 50

## 2022-02-27 MED ORDER — HEPARIN SOD (PORK) LOCK FLUSH 100 UNIT/ML IV SOLN
500.0000 [IU] | Freq: Once | INTRAVENOUS | Status: AC | PRN
  Administered 2022-02-27: 500 [IU]
  Filled 2022-02-27: qty 5

## 2022-02-27 MED ORDER — POTASSIUM CHLORIDE IN NACL 20-0.9 MEQ/L-% IV SOLN
Freq: Once | INTRAVENOUS | Status: AC
  Filled 2022-02-27: qty 1000

## 2022-02-27 MED ORDER — SODIUM CHLORIDE 0.9 % IV SOLN
150.0000 mg | Freq: Once | INTRAVENOUS | Status: AC
  Administered 2022-02-27: 150 mg via INTRAVENOUS
  Filled 2022-02-27: qty 150

## 2022-02-27 MED ORDER — OXYCODONE HCL 5 MG PO TABS
5.0000 mg | ORAL_TABLET | Freq: Once | ORAL | Status: AC
  Administered 2022-02-27: 5 mg via ORAL
  Filled 2022-02-27: qty 1

## 2022-02-27 MED ORDER — HEPARIN SOD (PORK) LOCK FLUSH 100 UNIT/ML IV SOLN
INTRAVENOUS | Status: AC
  Filled 2022-02-27: qty 5

## 2022-02-27 NOTE — Telephone Encounter (Signed)
Spoke to Paul Hebert at [facility] and she stated that they still do not have a date for lidocaine to be restocked; per [employer] a date is still not available for medication to be available for magic mouthwash.  ? ?

## 2022-02-27 NOTE — Patient Instructions (Signed)
[facility] CANCER CTR AT [place]  Discharge Instructions: ?Thank you for choosing [facility] to provide your oncology and hematology care.  ?If you have a lab appointment with the [facility], please go directly to the [facility] and check in at the registration area. ? ?Wear comfortable clothing and clothing appropriate for easy access to any Portacath or PICC line.  ? ?We strive to give you quality time with your provider. You may need to reschedule your appointment if you arrive late (15 or more minutes).  Arriving late affects you and other patients whose appointments are after yours.  Also, if you miss three or more appointments without notifying the office, you may be dismissed from the clinic at the provider?s discretion.    ?  ?For prescription refill requests, have your pharmacy contact our office and allow 72 hours for refills to be completed.   ? ?Today you received the following chemotherapy and/or immunotherapy agents Cisplatin  ?  ?To help prevent nausea and vomiting after your treatment, we encourage you to take your nausea medication as directed. ? ?BELOW ARE SYMPTOMS THAT SHOULD BE REPORTED IMMEDIATELY: ?*FEVER GREATER THAN 100.4 F (38 ?C) OR HIGHER ?*CHILLS OR SWEATING ?*NAUSEA AND VOMITING THAT IS NOT CONTROLLED WITH YOUR NAUSEA MEDICATION ?*UNUSUAL SHORTNESS OF BREATH ?*UNUSUAL BRUISING OR BLEEDING ?*URINARY PROBLEMS (pain or burning when urinating, or frequent urination) ?*BOWEL PROBLEMS (unusual diarrhea, constipation, pain near the anus) ?TENDERNESS IN MOUTH AND THROAT WITH OR WITHOUT PRESENCE OF ULCERS (sore throat, sores in mouth, or a toothache) ?UNUSUAL RASH, SWELLING OR PAIN  ?UNUSUAL VAGINAL DISCHARGE OR ITCHING  ? ?Items with * indicate a potential emergency and should be followed up as soon as possible or go to the Emergency Department if any problems should occur. ? ?Please show the CHEMOTHERAPY ALERT CARD or IMMUNOTHERAPY ALERT CARD at check-in to the  Emergency Department and triage nurse. ? ?Should you have questions after your visit or need to cancel or reschedule your appointment, please contact [facility] CANCER [place] AT [place]  [phone] and follow the prompts.  Office hours are 8:00 a.m. to 4:30 p.m. Monday - Friday. Please note that voicemails left after 4:00 p.m. may not be returned until the following business day.  We are closed weekends and major holidays. You have access to a nurse at all times for urgent questions. Please call the main number to the clinic [phone] and follow the prompts. ? ?For any non-urgent questions, you may also contact your provider using MyChart. We now offer e-Visits for anyone 26 and older to request care online for non-urgent symptoms. For details visit [email or web address]. ?  ?Also download the MyChart app! Go to the app store, search "MyChart", open the app, select [facility], and log in with your MyChart username and password. ? ?Due to Covid, a mask is required upon entering the hospital/clinic. If you do not have a mask, one will be given to you upon arrival. For doctor visits, patients may have 1 support person aged 56 or older with them. For treatment visits, patients cannot have anyone with them due to current Covid guidelines and our immunocompromised population.  ?

## 2022-02-27 NOTE — Progress Notes (Signed)
1204: Pt stating pain is not improved and he reports he has not taken his Oxycodone '5mg'$  since yesterday. Per Dr. Janese Banks okay to given Oxycodone '5mg'$  once at this time.  ?Pt reports that he has been having a really hard time with his pain and has been taking more than prescribed at home and wants to talk to MD about that. Pt states  "I will talk to her about it later this week" . Per Dr. Janese Banks, MD can not send in a new prescription for Oxycodone earlier than current prescription is due to run out.  Pt aware.  ? ?

## 2022-02-28 ENCOUNTER — Ambulatory Visit
Admission: RE | Admit: 2022-02-28 | Discharge: 2022-02-28 | Disposition: A | Discharge status: Home / Self Care | Payer: Medicaid Other | Source: Ambulatory Visit | Attending: Radiation Oncology | Admitting: Radiation Oncology

## 2022-02-28 ENCOUNTER — Other Ambulatory Visit: Payer: Self-pay | Admitting: *Deleted

## 2022-02-28 ENCOUNTER — Other Ambulatory Visit: Payer: Self-pay

## 2022-02-28 DIAGNOSIS — C01 Malignant neoplasm of base of tongue: Secondary | ICD-10-CM | POA: Diagnosis not present

## 2022-02-28 LAB — RAD ONC ARIA SESSION SUMMARY
Course Elapsed Days: 15
Plan Fractions Treated to Date: 12
Plan Prescribed Dose Per Fraction: 2 Gy
Plan Total Fractions Prescribed: 30
Plan Total Prescribed Dose: 60 Gy
Reference Point Dosage Given to Date: 24 Gy
Reference Point Session Dosage Given: 2 Gy
Session Number: 12

## 2022-02-28 MED ORDER — SUCRALFATE 1 G PO TABS: 1.0000 g | ORAL_TABLET | Freq: Three times a day (TID) | ORAL | 6 refills | Status: DC

## 2022-03-01 ENCOUNTER — Ambulatory Visit: Payer: Medicaid Other

## 2022-03-02 ENCOUNTER — Ambulatory Visit: Payer: Medicaid Other

## 2022-03-03 ENCOUNTER — Ambulatory Visit: Payer: Medicaid Other

## 2022-03-03 MED FILL — Fosaprepitant Dimeglumine For IV Infusion 150 MG (Base Eq): INTRAVENOUS | Qty: 5 | Status: AC

## 2022-03-03 MED FILL — Dexamethasone Sodium Phosphate Inj 100 MG/10ML: INTRAMUSCULAR | Qty: 1 | Status: AC

## 2022-03-06 ENCOUNTER — Other Ambulatory Visit: Payer: Self-pay

## 2022-03-06 ENCOUNTER — Encounter: Payer: Self-pay | Admitting: Oncology

## 2022-03-06 ENCOUNTER — Inpatient Hospital Stay (HOSPITAL_BASED_OUTPATIENT_CLINIC_OR_DEPARTMENT_OTHER): Payer: Medicaid Other | Admitting: Oncology

## 2022-03-06 ENCOUNTER — Inpatient Hospital Stay: Payer: Medicaid Other

## 2022-03-06 ENCOUNTER — Ambulatory Visit
Admission: RE | Admit: 2022-03-06 | Discharge: 2022-03-06 | Disposition: A | Discharge status: Home / Self Care | Payer: Medicaid Other | Source: Ambulatory Visit | Attending: Radiation Oncology | Admitting: Radiation Oncology

## 2022-03-06 VITALS — BP 118/82 | HR 85

## 2022-03-06 VITALS — BP 122/94 | HR 87 | Temp 97.5°F | Resp 18 | Wt 144.3 lb

## 2022-03-06 DIAGNOSIS — C01 Malignant neoplasm of base of tongue: Secondary | ICD-10-CM | POA: Diagnosis not present

## 2022-03-06 DIAGNOSIS — K1379 Other lesions of oral mucosa: Secondary | ICD-10-CM

## 2022-03-06 DIAGNOSIS — Z5111 Encounter for antineoplastic chemotherapy: Secondary | ICD-10-CM | POA: Diagnosis not present

## 2022-03-06 DIAGNOSIS — K1233 Oral mucositis (ulcerative) due to radiation: Secondary | ICD-10-CM | POA: Diagnosis not present

## 2022-03-06 DIAGNOSIS — C109 Malignant neoplasm of oropharynx, unspecified: Secondary | ICD-10-CM | POA: Diagnosis not present

## 2022-03-06 LAB — CBC WITH DIFFERENTIAL/PLATELET
Abs Immature Granulocytes: 0.01 10*3/uL (ref 0.00–0.07)
Basophils Absolute: 0 10*3/uL (ref 0.0–0.1)
Basophils Relative: 1 %
Eosinophils Absolute: 0.1 10*3/uL (ref 0.0–0.5)
Eosinophils Relative: 2 %
HCT: 39.2 % (ref 39.0–52.0)
Hemoglobin: 14.4 g/dL (ref 13.0–17.0)
Immature Granulocytes: 0 %
Lymphocytes Relative: 21 %
Lymphs Abs: 1.1 10*3/uL (ref 0.7–4.0)
MCH: 33.3 pg (ref 26.0–34.0)
MCHC: 36.7 g/dL — ABNORMAL HIGH (ref 30.0–36.0)
MCV: 90.7 fL (ref 80.0–100.0)
Monocytes Absolute: 0.4 10*3/uL (ref 0.1–1.0)
Monocytes Relative: 8 %
Neutro Abs: 3.6 10*3/uL (ref 1.7–7.7)
Neutrophils Relative %: 68 %
Platelets: 171 10*3/uL (ref 150–400)
RBC: 4.32 MIL/uL (ref 4.22–5.81)
RDW: 12.4 % (ref 11.5–15.5)
WBC: 5.2 10*3/uL (ref 4.0–10.5)
nRBC: 0 % (ref 0.0–0.2)

## 2022-03-06 LAB — COMPREHENSIVE METABOLIC PANEL
ALT: 15 U/L (ref 0–44)
AST: 20 U/L (ref 15–41)
Albumin: 4.4 g/dL (ref 3.5–5.0)
Alkaline Phosphatase: 58 U/L (ref 38–126)
Anion gap: 7 (ref 5–15)
BUN: 17 mg/dL (ref 6–20)
CO2: 27 mmol/L (ref 22–32)
Calcium: 8.9 mg/dL (ref 8.9–10.3)
Chloride: 100 mmol/L (ref 98–111)
Creatinine, Ser: 0.78 mg/dL (ref 0.61–1.24)
GFR, Estimated: >60 mL/min (ref >60)
Glucose, Bld: 103 mg/dL — ABNORMAL HIGH (ref 70–99)
Potassium: 4.1 mmol/L (ref 3.5–5.1)
Sodium: 134 mmol/L — ABNORMAL LOW (ref 135–145)
Total Bilirubin: 0.5 mg/dL (ref 0.3–1.2)
Total Protein: 7.6 g/dL (ref 6.5–8.1)

## 2022-03-06 LAB — RAD ONC ARIA SESSION SUMMARY
Course Elapsed Days: 21
Plan Fractions Treated to Date: 13
Plan Prescribed Dose Per Fraction: 2 Gy
Plan Total Fractions Prescribed: 30
Plan Total Prescribed Dose: 60 Gy
Reference Point Dosage Given to Date: 26 Gy
Reference Point Session Dosage Given: 2 Gy
Session Number: 13

## 2022-03-06 LAB — MAGNESIUM: Magnesium: 2.1 mg/dL (ref 1.7–2.4)

## 2022-03-06 MED ORDER — SODIUM CHLORIDE 0.9 % IV SOLN
10.0000 mg | Freq: Once | INTRAVENOUS | Status: AC
  Administered 2022-03-06: 10 mg via INTRAVENOUS
  Filled 2022-03-06: qty 10

## 2022-03-06 MED ORDER — SODIUM CHLORIDE 0.9 % IV SOLN
Freq: Once | INTRAVENOUS | Status: AC
  Filled 2022-03-06: qty 250

## 2022-03-06 MED ORDER — OXYCODONE HCL ER 10 MG PO T12A: 10.0000 mg | EXTENDED_RELEASE_TABLET | ORAL | 0 refills | Status: DC | PRN

## 2022-03-06 MED ORDER — PALONOSETRON HCL INJECTION 0.25 MG/5ML
0.2500 mg | Freq: Once | INTRAVENOUS | Status: AC
  Administered 2022-03-06: 0.25 mg via INTRAVENOUS

## 2022-03-06 MED ORDER — OXYCODONE HCL 10 MG PO TABS: 10.0000 mg | ORAL_TABLET | ORAL | 0 refills | Status: DC | PRN

## 2022-03-06 MED ORDER — POTASSIUM CHLORIDE IN NACL 20-0.9 MEQ/L-% IV SOLN
Freq: Once | INTRAVENOUS | Status: AC
  Filled 2022-03-06: qty 1000

## 2022-03-06 MED ORDER — HEPARIN SOD (PORK) LOCK FLUSH 100 UNIT/ML IV SOLN
500.0000 [IU] | Freq: Once | INTRAVENOUS | Status: AC | PRN
  Administered 2022-03-06: 500 [IU]
  Filled 2022-03-06: qty 5

## 2022-03-06 MED ORDER — SODIUM CHLORIDE 0.9 % IV SOLN
40.0000 mg/m2 | Freq: Once | INTRAVENOUS | Status: AC
  Administered 2022-03-06: 73 mg via INTRAVENOUS
  Filled 2022-03-06: qty 73

## 2022-03-06 MED ORDER — SODIUM CHLORIDE 0.9 % IV SOLN
150.0000 mg | Freq: Once | INTRAVENOUS | Status: AC
  Administered 2022-03-06: 150 mg via INTRAVENOUS
  Filled 2022-03-06: qty 150

## 2022-03-06 MED ORDER — MORPHINE SULFATE (PF) 2 MG/ML IV SOLN: 4.0000 mg | Freq: Once | INTRAVENOUS | Status: AC

## 2022-03-06 MED ORDER — MORPHINE SULFATE (PF) 2 MG/ML IV SOLN
4.0000 mg | Freq: Once | INTRAVENOUS | Status: AC
  Administered 2022-03-06: 4 mg via INTRAVENOUS
  Filled 2022-03-06: qty 2

## 2022-03-06 NOTE — Progress Notes (Signed)
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 Patient states that he presented with these complaints 6 months ago and had a CT soft tissue neck which did not show any malignancy.  He continued to have on and off pain which was gradually getting worse and he presented back to the ER on 09/27/2020.     CT soft tissue neck showed a left base of tongue mass measuring 3.4 x 2.6 x 3.3 cm which was invading the tongue along the anterior and medial margin and extending into the left vallecula.  Possible superior and posterior extension to involve the left palatine tonsil the lesion abuts the uvula with early extension into the soft palate.  Multiple left level 2 lymph nodes largest of which was 1.2 cm.  Additional necrotic limited to level 3 nodal conglomerate measures 1.3 cm.  No right-sided adenopathy.   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  PET CT scan Showed left base of tongue mass measuring about 2.8 cm with enlarged centrally necrotic level 2/3 left-sided and lymph nodes with an SUV about 3.7.  No hypermetabolic right neck nodes.  No findings of distant metastatic disease   Patient completed 5 cycles of weekly cisplatin chemotherapy between January to March 2022.  There were treatment interruptions due to  mucositis.  He could not complete 7 cycles eventually.  He did complete his radiation treatment.   Towards the end of radiation patient was admitted for suicidal ideation.  At the end of chemoradiation patient did not have any evidence of residual disease.  However he was found to have biopsy-proven local recurrence in March 2023.  Plan is for retreatment with cisplatin and radiation     Interval history-patient did not receive radiation for 3 days last week due to worsening mucositis.  Mucositis is better now but patient continues to have significant pain in his mouth as well as when he swallows.  He is on5 mg of oxycodone and he has not been able to to get his pain under control with that dosing and he has been requiring it roughly every 4 hours.  He has lost 4 pounds since the last 3 weeks and he is trying to keep up with his nutrition.  He mostly eats soft foods such as eggs broth and Ensure.  ECOG PS- 1 Pain scale- 6 Opioid associated constipation- no  Review of systems- Review of Systems  Constitutional:  Negative for chills, fever, malaise/fatigue and weight loss.  HENT:  Negative for congestion, ear discharge and nosebleeds.        Throat pain  Eyes:  Negative for blurred vision.  Respiratory:  Negative for cough, hemoptysis, sputum production, shortness of breath and wheezing.   Cardiovascular:  Negative for chest pain, palpitations, orthopnea and claudication.  Gastrointestinal:  Negative for abdominal pain, blood in stool, constipation, diarrhea, heartburn, melena, nausea and vomiting.  Genitourinary:  Negative  for dysuria, flank pain, frequency, hematuria and urgency.  Musculoskeletal:  Negative for back pain, joint pain and myalgias.  Skin:  Negative for rash.  Neurological:  Negative for dizziness, tingling, focal weakness, seizures, weakness and headaches.  Endo/Heme/Allergies:  Does not bruise/bleed easily.  Psychiatric/Behavioral:  Negative for depression and suicidal ideas.  The patient does not have insomnia.       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 CV LAB;  Service: Cardiovascular;  Laterality: N/A;   PORTA CATH INSERTION N/A 02/09/2022   Procedure: PORTA CATH INSERTION;  Surgeon: Algernon Huxley, MD;  Location: [facility] CV LAB;  Service: Cardiovascular;  Laterality: N/A;   REMOVAL OF GASTROSTOMY TUBE N/A 02/04/2021   Procedure: REMOVAL OF GASTROSTOMY TUBE;  Surgeon: Olean Ree, MD;  Location: [facility] ORS;  Service: General;  Laterality: N/A;   TONGUE BIOPSY Left 01/24/2022   Procedure: TONGUE BIOPSY;  Surgeon: Beverly Gust, MD;  Location: [facility] ORS;  Service: ENT;  Laterality: Left;    Social History   Socioeconomic History   Marital status: Single    Spouse name: Not on file   Number of children: Not on file   Years of education: Not on file   Highest education level: Not on file  Occupational History   Not on file  Tobacco Use   Smoking status: Every Day    Packs/day: 1.50    Years: 35.00    Pack years: 52.50    Types: Cigarettes   Smokeless tobacco: Current   Tobacco comments:    Patches very rarely  Vaping Use   Vaping Use: Former  Substance and Sexual Activity   Alcohol use: Yes    Comment: occ   Drug use: Yes    Types: Marijuana, "Crack" cocaine, Methamphetamines    Comment: marijuana every day   Sexual activity: Not Currently  Other  Topics Concern   Not on file  Social History Narrative   Not on file   Social Determinants of Health   Financial Resource Strain: Not on file  Food Insecurity: Not on file  Transportation Needs: Not on file  Physical Activity: Not on file  Stress: Not on file  Social Connections: Not on file  Intimate Partner Violence: Not on file    Family History  Problem Relation Age of Onset   Cancer Father      Current Outpatient Medications:    dexamethasone (DECADRON) 4 MG tablet, Take 2 tablets (8 mg total) by mouth daily. Take daily x 3 days starting the day after cisplatin chemotherapy. Take with food., Disp: 30 tablet, Rfl: 1   gabapentin (NEURONTIN) 300 MG capsule, Take 1 capsule (300 mg total) by mouth 3 (three) times daily., Disp: 90 capsule, Rfl: 1   lansoprazole (PREVACID) 30 MG capsule, Take 1 capsule (30 mg total) by mouth daily at 12 noon., Disp: 90 capsule, Rfl: 3  lidocaine-prilocaine (EMLA) cream, Apply to affected area once, Disp: 30 g, Rfl: 3   LORazepam (ATIVAN) 0.5 MG tablet, Take 1 tablet (0.5 mg total) by mouth daily. Once a day prior to each radiation treatment, Disp: 30 tablet, Rfl: 0   oxyCODONE (OXY IR/ROXICODONE) 5 MG immediate release tablet, Take 1 tablet (5 mg total) by mouth every 8 (eight) hours as needed for severe pain., Disp: 90 tablet, Rfl: 0   prochlorperazine (COMPAZINE) 10 MG tablet, Take 1 tablet (10 mg total) by mouth every 6 (six) hours as needed (Nausea or vomiting)., Disp: 30 tablet, Rfl: 1   sucralfate (CARAFATE) 1 g tablet, Take 1 tablet (1 g total) by mouth 3 (three) times daily before meals., Disp: 90 tablet, Rfl: 6   calcium carbonate (TUMS - DOSED IN MG ELEMENTAL CALCIUM) 500 MG chewable tablet, Chew 1 tablet by mouth as needed for indigestion or heartburn. (Patient not taking: Reported on 03/06/2022), Disp: , Rfl:    naproxen (NAPROSYN) 500 MG tablet, Take 1 tablet (500 mg total) by mouth 2 (two) times daily with a meal. (Patient not taking:  Reported on 02/01/2022), Disp: 20 tablet, Rfl: 0   ondansetron (ZOFRAN) 8 MG tablet, Take 1 tablet (8 mg total) by mouth 2 (two) times daily as needed. Start on the third day after cisplatin chemotherapy. (Patient not taking: Reported on 02/13/2022), Disp: 30 tablet, Rfl: 1 No current facility-administered medications for this visit.  Facility-Administered Medications Ordered in Other Visits:    0.9 %  sodium chloride infusion, , Intravenous, Once, Sindy Guadeloupe, MD   0.9 % NaCl with KCl 20 mEq/ L  infusion, , Intravenous, Once, Sindy Guadeloupe, MD   dexamethasone (DECADRON) 10 mg in sodium chloride 0.9 % 50 mL IVPB, 10 mg, Intravenous, Once, Sindy Guadeloupe, MD   fosaprepitant (EMEND) 150 mg in sodium chloride 0.9 % 145 mL IVPB, 150 mg, Intravenous, Once, Sindy Guadeloupe, MD   heparin lock flush 100 UNIT/ML injection, , , ,    heparin lock flush 100 unit/mL, 500 Units, Intracatheter, Once PRN, Sindy Guadeloupe, MD   palonosetron (ALOXI) injection 0.25 mg, 0.25 mg, Intravenous, Once, Sindy Guadeloupe, MD  Physical exam:  Vitals:   03/06/22 0833  BP: (!) 122/94  Pulse: 87  Resp: 18  Temp: (!) 97.5 F (36.4 C)  SpO2: 100%  Weight: 144 lb 4.8 oz (65.5 kg)   Physical Exam Constitutional:      General: He is not in acute distress. HENT:     Mouth/Throat:     Pharynx: Oropharynx is clear.     Comments: Grade 1 mucositis with confluent areas of erythema but no ulceration noted Cardiovascular:     Rate and Rhythm: Normal rate and regular rhythm.     Heart sounds: Normal heart sounds.  Pulmonary:     Effort: Pulmonary effort is normal.     Breath sounds: Normal breath sounds.  Abdominal:     General: Bowel sounds are normal.     Palpations: Abdomen is soft.  Skin:    General: Skin is warm and dry.  Neurological:     Mental Status: He is alert and oriented to person, place, and time.        Latest Ref Rng & Units 03/06/2022    8:09 AM  CMP  Glucose 70 - 99 mg/dL 103    BUN 6 - 20 mg/dL  17    Creatinine 0.61 - 1.24 mg/dL 0.78    Sodium  135 - 145 mmol/L 134    Potassium 3.5 - 5.1 mmol/L 4.1    Chloride 98 - 111 mmol/L 100    CO2 22 - 32 mmol/L 27    Calcium 8.9 - 10.3 mg/dL 8.9    Total Protein 6.5 - 8.1 g/dL 7.6    Total Bilirubin 0.3 - 1.2 mg/dL 0.5    Alkaline Phos 38 - 126 U/L 58    AST 15 - 41 U/L 20    ALT 0 - 44 U/L 15        Latest Ref Rng & Units 03/06/2022    8:09 AM  CBC  WBC 4.0 - 10.5 K/uL 5.2    Hemoglobin 13.0 - 17.0 g/dL 14.4    Hematocrit 39.0 - 52.0 % 39.2    Platelets 150 - 400 K/uL 171      No images are attached to the encounter.  PERIPHERAL VASCULAR CATHETERIZATION  Result Date: 02/09/2022 See surgical note for result.    Assessment and plan- Patient is a 51 y.o. 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  He is s/p 5 cycles of weekly cisplatin chemotherapy with concurrent radiation treatment.  Chemo was stopped prematurely due to worsening mucositis and patient completed radiation treatment in April 2022.  He now has noted blood and biopsy-proven locally recurrent disease.  He is here for on treatment assessment prior to cycle 4 of weekly cisplatin chemotherapy  Counts okay to proceed with cycle 4 of weekly cisplatin chemotherapy today I will see him back in 1 week for cycle 5.  He will get total 7 cycles.  Radiation-induced mucositis: Continue Magic mouthwash.  He has also been given sucralfate by radiation oncology.  I will  increase his oxycodone from 5 mg to 10 mg every 4 hours as needed.   Visit Diagnosis 1. Encounter for antineoplastic chemotherapy   2. Squamous cell carcinoma of oropharynx ([place])   3. Mucositis due to radiation therapy      Dr. Randa Evens, MD, MPH [facility] [phone] 03/06/2022 9:22 AM

## 2022-03-06 NOTE — Progress Notes (Signed)
Pt states that he has not beenable to eat at all due to the sores and ulcers in hismouth; has been taking extra oxycodone due to pain. Down about 10 lbs since last visit; was out of work from Wednesday-Friday per Dr. Donella Stade due to his sores. Dr. Donella Stade prescribed SUCRALFATE and seems to be helping. Pain Level- 8/10 this morning.

## 2022-03-06 NOTE — Patient Instructions (Signed)
[facility] CANCER CTR AT [place]  Discharge Instructions: Thank you for choosing [facility] to provide your oncology and hematology care.  If you have a lab appointment with the [facility], please go directly to the [facility] and check in at the registration area.  Wear comfortable clothing and clothing appropriate for easy access to any Portacath or PICC line.   We strive to give you quality time with your provider. You may need to reschedule your appointment if you arrive late (15 or more minutes).  Arriving late affects you and other patients whose appointments are after yours.  Also, if you miss three or more appointments without notifying the office, you may be dismissed from the clinic at the provider's discretion.      For prescription refill requests, have your pharmacy contact our office and allow 72 hours for refills to be completed.    Today you received the following chemotherapy and/or immunotherapy agents: CISplatin   To help prevent nausea and vomiting after your treatment, we encourage you to take your nausea medication as directed.  BELOW ARE SYMPTOMS THAT SHOULD BE REPORTED IMMEDIATELY: *FEVER GREATER THAN 100.4 F (38 C) OR HIGHER *CHILLS OR SWEATING *NAUSEA AND VOMITING THAT IS NOT CONTROLLED WITH YOUR NAUSEA MEDICATION *UNUSUAL SHORTNESS OF BREATH *UNUSUAL BRUISING OR BLEEDING *URINARY PROBLEMS (pain or burning when urinating, or frequent urination) *BOWEL PROBLEMS (unusual diarrhea, constipation, pain near the anus) TENDERNESS IN MOUTH AND THROAT WITH OR WITHOUT PRESENCE OF ULCERS (sore throat, sores in mouth, or a toothache) UNUSUAL RASH, SWELLING OR PAIN  UNUSUAL VAGINAL DISCHARGE OR ITCHING   Items with * indicate a potential emergency and should be followed up as soon as possible or go to the Emergency Department if any problems should occur.  Please show the CHEMOTHERAPY ALERT CARD or IMMUNOTHERAPY ALERT CARD at check-in to the  Emergency Department and triage nurse.  Should you have questions after your visit or need to cancel or reschedule your appointment, please contact [facility] CANCER [place] AT [place]  [phone] and follow the prompts.  Office hours are 8:00 a.m. to 4:30 p.m. Monday - Friday. Please note that voicemails left after 4:00 p.m. may not be returned until the following business day.  We are closed weekends and major holidays. You have access to a nurse at all times for urgent questions. Please call the main number to the clinic [phone] and follow the prompts.  For any non-urgent questions, you may also contact your provider using MyChart. We now offer e-Visits for anyone 32 and older to request care online for non-urgent symptoms. For details visit [email or web address].   Also download the MyChart app! Go to the app store, search "MyChart", open the app, select [place], and log in with your MyChart username and password.  Due to Covid, a mask is required upon entering the hospital/clinic. If you do not have a mask, one will be given to you upon arrival. For doctor visits, patients may have 1 support person aged 52 or older with them. For treatment visits, patients cannot have anyone with them due to current Covid guidelines and our immunocompromised population.

## 2022-03-06 NOTE — Progress Notes (Signed)
Patient c/o oral pain 8/10 MD notified order 4 mg Morphine

## 2022-03-06 NOTE — Progress Notes (Signed)
Patient tolerated CISplatin / potassium infusion well. Patient expresses relief from Morphine. No other questions/concerns voiced. Patient stable at discharge. AVS given.

## 2022-03-07 ENCOUNTER — Other Ambulatory Visit: Payer: Self-pay

## 2022-03-07 ENCOUNTER — Ambulatory Visit
Admission: RE | Admit: 2022-03-07 | Discharge: 2022-03-07 | Disposition: A | Discharge status: Home / Self Care | Payer: Medicaid Other | Source: Ambulatory Visit | Attending: Radiation Oncology | Admitting: Radiation Oncology

## 2022-03-07 DIAGNOSIS — C01 Malignant neoplasm of base of tongue: Secondary | ICD-10-CM | POA: Diagnosis not present

## 2022-03-07 LAB — RAD ONC ARIA SESSION SUMMARY
Course Elapsed Days: 22
Plan Fractions Treated to Date: 14
Plan Prescribed Dose Per Fraction: 2 Gy
Plan Total Fractions Prescribed: 30
Plan Total Prescribed Dose: 60 Gy
Reference Point Dosage Given to Date: 28 Gy
Reference Point Session Dosage Given: 2 Gy
Session Number: 14

## 2022-03-08 ENCOUNTER — Other Ambulatory Visit: Payer: Self-pay

## 2022-03-08 ENCOUNTER — Ambulatory Visit
Admission: RE | Admit: 2022-03-08 | Discharge: 2022-03-08 | Disposition: A | Discharge status: Home / Self Care | Payer: Medicaid Other | Source: Ambulatory Visit | Attending: Radiation Oncology | Admitting: Radiation Oncology

## 2022-03-08 DIAGNOSIS — C01 Malignant neoplasm of base of tongue: Secondary | ICD-10-CM | POA: Diagnosis not present

## 2022-03-08 LAB — RAD ONC ARIA SESSION SUMMARY
Course Elapsed Days: 23
Plan Fractions Treated to Date: 15
Plan Prescribed Dose Per Fraction: 2 Gy
Plan Total Fractions Prescribed: 30
Plan Total Prescribed Dose: 60 Gy
Reference Point Dosage Given to Date: 30 Gy
Reference Point Session Dosage Given: 2 Gy
Session Number: 15

## 2022-03-09 ENCOUNTER — Other Ambulatory Visit: Payer: Self-pay

## 2022-03-09 ENCOUNTER — Ambulatory Visit
Admission: RE | Admit: 2022-03-09 | Discharge: 2022-03-09 | Disposition: A | Discharge status: Home / Self Care | Payer: Medicaid Other | Source: Ambulatory Visit | Attending: Radiation Oncology | Admitting: Radiation Oncology

## 2022-03-09 DIAGNOSIS — C01 Malignant neoplasm of base of tongue: Secondary | ICD-10-CM | POA: Diagnosis not present

## 2022-03-09 LAB — RAD ONC ARIA SESSION SUMMARY
Course Elapsed Days: 24
Plan Fractions Treated to Date: 16
Plan Prescribed Dose Per Fraction: 2 Gy
Plan Total Fractions Prescribed: 30
Plan Total Prescribed Dose: 60 Gy
Reference Point Dosage Given to Date: 32 Gy
Reference Point Session Dosage Given: 2 Gy
Session Number: 16

## 2022-03-10 ENCOUNTER — Ambulatory Visit
Admission: RE | Admit: 2022-03-10 | Discharge: 2022-03-10 | Disposition: A | Discharge status: Home / Self Care | Payer: Medicaid Other | Source: Ambulatory Visit | Attending: Radiation Oncology | Admitting: Radiation Oncology

## 2022-03-10 ENCOUNTER — Other Ambulatory Visit: Payer: Self-pay

## 2022-03-10 DIAGNOSIS — C01 Malignant neoplasm of base of tongue: Secondary | ICD-10-CM | POA: Diagnosis not present

## 2022-03-10 LAB — RAD ONC ARIA SESSION SUMMARY
Course Elapsed Days: 25
Plan Fractions Treated to Date: 17
Plan Prescribed Dose Per Fraction: 2 Gy
Plan Total Fractions Prescribed: 30
Plan Total Prescribed Dose: 60 Gy
Reference Point Dosage Given to Date: 34 Gy
Reference Point Session Dosage Given: 2 Gy
Session Number: 17

## 2022-03-14 ENCOUNTER — Other Ambulatory Visit: Payer: Self-pay

## 2022-03-14 ENCOUNTER — Ambulatory Visit
Admission: RE | Admit: 2022-03-14 | Discharge: 2022-03-14 | Disposition: A | Discharge status: Home / Self Care | Payer: Medicaid Other | Source: Ambulatory Visit | Attending: Radiation Oncology | Admitting: Radiation Oncology

## 2022-03-14 DIAGNOSIS — C01 Malignant neoplasm of base of tongue: Secondary | ICD-10-CM | POA: Diagnosis not present

## 2022-03-14 LAB — RAD ONC ARIA SESSION SUMMARY
Course Elapsed Days: 29
Plan Fractions Treated to Date: 18
Plan Prescribed Dose Per Fraction: 2 Gy
Plan Total Fractions Prescribed: 30
Plan Total Prescribed Dose: 60 Gy
Reference Point Dosage Given to Date: 36 Gy
Reference Point Session Dosage Given: 2 Gy
Session Number: 18

## 2022-03-15 ENCOUNTER — Other Ambulatory Visit: Payer: Self-pay

## 2022-03-15 ENCOUNTER — Inpatient Hospital Stay: Payer: Medicaid Other

## 2022-03-15 ENCOUNTER — Inpatient Hospital Stay: Payer: Medicaid Other | Admitting: Oncology

## 2022-03-15 ENCOUNTER — Ambulatory Visit
Admission: RE | Admit: 2022-03-15 | Discharge: 2022-03-15 | Disposition: A | Discharge status: Home / Self Care | Payer: Medicaid Other | Source: Ambulatory Visit | Attending: Radiation Oncology | Admitting: Radiation Oncology

## 2022-03-15 DIAGNOSIS — C01 Malignant neoplasm of base of tongue: Secondary | ICD-10-CM | POA: Diagnosis not present

## 2022-03-15 LAB — RAD ONC ARIA SESSION SUMMARY
Course Elapsed Days: 30
Plan Fractions Treated to Date: 19
Plan Prescribed Dose Per Fraction: 2 Gy
Plan Total Fractions Prescribed: 30
Plan Total Prescribed Dose: 60 Gy
Reference Point Dosage Given to Date: 38 Gy
Reference Point Session Dosage Given: 2 Gy
Session Number: 19

## 2022-03-15 MED FILL — Dexamethasone Sodium Phosphate Inj 100 MG/10ML: INTRAMUSCULAR | Qty: 1 | Status: AC

## 2022-03-15 MED FILL — Fosaprepitant Dimeglumine For IV Infusion 150 MG (Base Eq): INTRAVENOUS | Qty: 5 | Status: AC

## 2022-03-16 ENCOUNTER — Inpatient Hospital Stay: Payer: Medicaid Other

## 2022-03-16 ENCOUNTER — Ambulatory Visit: Payer: Medicaid Other

## 2022-03-17 ENCOUNTER — Ambulatory Visit: Payer: Medicaid Other

## 2022-03-20 ENCOUNTER — Ambulatory Visit
Admission: RE | Admit: 2022-03-20 | Discharge: 2022-03-20 | Disposition: A | Discharge status: Home / Self Care | Payer: Medicaid Other | Source: Ambulatory Visit | Attending: Radiation Oncology | Admitting: Radiation Oncology

## 2022-03-20 ENCOUNTER — Other Ambulatory Visit: Payer: Self-pay

## 2022-03-20 DIAGNOSIS — Z923 Personal history of irradiation: Secondary | ICD-10-CM | POA: Insufficient documentation

## 2022-03-20 DIAGNOSIS — Z79899 Other long term (current) drug therapy: Secondary | ICD-10-CM | POA: Diagnosis not present

## 2022-03-20 DIAGNOSIS — C01 Malignant neoplasm of base of tongue: Secondary | ICD-10-CM | POA: Insufficient documentation

## 2022-03-20 DIAGNOSIS — Y842 Radiological procedure and radiotherapy as the cause of abnormal reaction of the patient, or of later complication, without mention of misadventure at the time of the procedure: Secondary | ICD-10-CM | POA: Insufficient documentation

## 2022-03-20 DIAGNOSIS — Z5111 Encounter for antineoplastic chemotherapy: Secondary | ICD-10-CM | POA: Diagnosis present

## 2022-03-20 LAB — RAD ONC ARIA SESSION SUMMARY
Course Elapsed Days: 35
Plan Fractions Treated to Date: 20
Plan Prescribed Dose Per Fraction: 2 Gy
Plan Total Fractions Prescribed: 30
Plan Total Prescribed Dose: 60 Gy
Reference Point Dosage Given to Date: 40 Gy
Reference Point Session Dosage Given: 2 Gy
Session Number: 20

## 2022-03-21 ENCOUNTER — Ambulatory Visit
Admission: RE | Admit: 2022-03-21 | Discharge: 2022-03-21 | Disposition: A | Discharge status: Home / Self Care | Payer: Medicaid Other | Source: Ambulatory Visit | Attending: Radiation Oncology | Admitting: Radiation Oncology

## 2022-03-21 ENCOUNTER — Other Ambulatory Visit: Payer: Self-pay

## 2022-03-21 DIAGNOSIS — Z5111 Encounter for antineoplastic chemotherapy: Secondary | ICD-10-CM | POA: Diagnosis not present

## 2022-03-21 LAB — RAD ONC ARIA SESSION SUMMARY
Course Elapsed Days: 36
Plan Fractions Treated to Date: 21
Plan Prescribed Dose Per Fraction: 2 Gy
Plan Total Fractions Prescribed: 30
Plan Total Prescribed Dose: 60 Gy
Reference Point Dosage Given to Date: 42 Gy
Reference Point Session Dosage Given: 2 Gy
Session Number: 21

## 2022-03-22 ENCOUNTER — Other Ambulatory Visit: Payer: Medicaid Other

## 2022-03-22 ENCOUNTER — Inpatient Hospital Stay: Payer: Medicaid Other | Attending: Oncology

## 2022-03-22 ENCOUNTER — Ambulatory Visit
Admission: RE | Admit: 2022-03-22 | Discharge: 2022-03-22 | Disposition: A | Discharge status: Home / Self Care | Payer: Medicaid Other | Source: Ambulatory Visit | Attending: Radiation Oncology | Admitting: Radiation Oncology

## 2022-03-22 ENCOUNTER — Ambulatory Visit: Payer: Medicaid Other | Admitting: Nurse Practitioner

## 2022-03-22 ENCOUNTER — Inpatient Hospital Stay (HOSPITAL_BASED_OUTPATIENT_CLINIC_OR_DEPARTMENT_OTHER): Payer: Medicaid Other | Admitting: Nurse Practitioner

## 2022-03-22 ENCOUNTER — Other Ambulatory Visit: Payer: Self-pay

## 2022-03-22 VITALS — BP 134/83 | HR 76 | Temp 98.4°F | Resp 16 | Wt 141.0 lb

## 2022-03-22 DIAGNOSIS — G893 Neoplasm related pain (acute) (chronic): Secondary | ICD-10-CM

## 2022-03-22 DIAGNOSIS — K1233 Oral mucositis (ulcerative) due to radiation: Secondary | ICD-10-CM

## 2022-03-22 DIAGNOSIS — Z5111 Encounter for antineoplastic chemotherapy: Secondary | ICD-10-CM

## 2022-03-22 DIAGNOSIS — C109 Malignant neoplasm of oropharynx, unspecified: Secondary | ICD-10-CM

## 2022-03-22 DIAGNOSIS — Z95828 Presence of other vascular implants and grafts: Secondary | ICD-10-CM

## 2022-03-22 DIAGNOSIS — F419 Anxiety disorder, unspecified: Secondary | ICD-10-CM | POA: Diagnosis not present

## 2022-03-22 LAB — RAD ONC ARIA SESSION SUMMARY
Course Elapsed Days: 37
Plan Fractions Treated to Date: 22
Plan Prescribed Dose Per Fraction: 2 Gy
Plan Total Fractions Prescribed: 30
Plan Total Prescribed Dose: 60 Gy
Reference Point Dosage Given to Date: 44 Gy
Reference Point Session Dosage Given: 2 Gy
Session Number: 22

## 2022-03-22 LAB — BASIC METABOLIC PANEL
Anion gap: 8 (ref 5–15)
BUN: 20 mg/dL (ref 6–20)
CO2: 27 mmol/L (ref 22–32)
Calcium: 8.9 mg/dL (ref 8.9–10.3)
Chloride: 98 mmol/L (ref 98–111)
Creatinine, Ser: 0.88 mg/dL (ref 0.61–1.24)
GFR, Estimated: >60 mL/min (ref >60)
Glucose, Bld: 92 mg/dL (ref 70–99)
Potassium: 4.1 mmol/L (ref 3.5–5.1)
Sodium: 133 mmol/L — ABNORMAL LOW (ref 135–145)

## 2022-03-22 LAB — CBC WITH DIFFERENTIAL/PLATELET
Abs Immature Granulocytes: 0 10*3/uL (ref 0.00–0.07)
Basophils Absolute: 0 10*3/uL (ref 0.0–0.1)
Basophils Relative: 1 %
Eosinophils Absolute: 0.1 10*3/uL (ref 0.0–0.5)
Eosinophils Relative: 3 %
HCT: 36.2 % — ABNORMAL LOW (ref 39.0–52.0)
Hemoglobin: 13.5 g/dL (ref 13.0–17.0)
Immature Granulocytes: 0 %
Lymphocytes Relative: 38 %
Lymphs Abs: 1.5 10*3/uL (ref 0.7–4.0)
MCH: 34.4 pg — ABNORMAL HIGH (ref 26.0–34.0)
MCHC: 37.3 g/dL — ABNORMAL HIGH (ref 30.0–36.0)
MCV: 92.3 fL (ref 80.0–100.0)
Monocytes Absolute: 0.4 10*3/uL (ref 0.1–1.0)
Monocytes Relative: 9 %
Neutro Abs: 1.9 10*3/uL (ref 1.7–7.7)
Neutrophils Relative %: 49 %
Platelets: 159 10*3/uL (ref 150–400)
RBC: 3.92 MIL/uL — ABNORMAL LOW (ref 4.22–5.81)
RDW: 13.3 % (ref 11.5–15.5)
WBC: 3.8 10*3/uL — ABNORMAL LOW (ref 4.0–10.5)
nRBC: 0 % (ref 0.0–0.2)

## 2022-03-22 LAB — MAGNESIUM: Magnesium: 2 mg/dL (ref 1.7–2.4)

## 2022-03-22 MED ORDER — SODIUM CHLORIDE 0.9% FLUSH
10.0000 mL | INTRAVENOUS | Status: DC | PRN
  Administered 2022-03-22: 10 mL via INTRAVENOUS
  Filled 2022-03-22: qty 10

## 2022-03-22 MED ORDER — LORAZEPAM 0.5 MG PO TABS: 0.5000 mg | ORAL_TABLET | Freq: Every day | ORAL | 0 refills | Status: DC

## 2022-03-22 MED ORDER — HEPARIN SOD (PORK) LOCK FLUSH 100 UNIT/ML IV SOLN
500.0000 [IU] | Freq: Once | INTRAVENOUS | Status: AC
  Administered 2022-03-22: 500 [IU] via INTRAVENOUS
  Filled 2022-03-22: qty 5

## 2022-03-22 MED FILL — Dexamethasone Sodium Phosphate Inj 100 MG/10ML: INTRAMUSCULAR | Qty: 1 | Status: AC

## 2022-03-22 MED FILL — Fosaprepitant Dimeglumine For IV Infusion 150 MG (Base Eq): INTRAVENOUS | Qty: 5 | Status: AC

## 2022-03-22 NOTE — Progress Notes (Signed)
Pt returns for follow-up. He reports that his eating has improved over the last couple of days. He reports ongoing headache. He states that he does not like to take the oxycodone because he doesn't like the way it makes him feel.

## 2022-03-22 NOTE — Progress Notes (Signed)
Hematology/Oncology Consult Note 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 Patient states that he presented with these complaints 6 months ago and had a CT soft tissue neck which did not show any malignancy.  He continued to have on and off pain which was gradually getting worse and he presented back to the ER on 09/27/2020.     CT soft tissue neck showed a left base of tongue mass measuring 3.4 x 2.6 x 3.3 cm which was invading the tongue along the anterior and medial margin and extending into the left vallecula.  Possible superior and posterior extension to involve the left palatine tonsil the lesion abuts the uvula with early extension into the soft palate.  Multiple left level 2 lymph nodes largest of which was 1.2 cm.  Additional necrotic limited to level 3 nodal conglomerate measures 1.3 cm.  No right-sided adenopathy.   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  PET CT scan Showed left base of tongue mass measuring about 2.8 cm with enlarged centrally necrotic level 2/3 left-sided and lymph nodes with an SUV about 3.7.  No hypermetabolic right neck nodes.  No findings of distant metastatic disease   Patient completed 5 cycles of weekly cisplatin chemotherapy between January to March 2022.  There were treatment interruptions due to  mucositis.  He could not complete 7 cycles eventually.  He did complete his radiation treatment.   Towards the end of radiation patient was admitted for suicidal ideation.  At the end of chemoradiation patient did not have any evidence of residual disease.  However he was found to have biopsy-proven local recurrence in March 2023.  Plan is for retreatment with cisplatin and radiation     Interval history- Continues to lose weight. Missed treatment last week due to travel. Did not receive radiation or chemotherapy. Returns today for consideration of cycle 5 of planned 7 cycles. Says that he hasn't required pain medication is several days. Requests refill of ativan which he takes for anxiety prior to radiation. Complains of left jaw pain that radiates down jaw to left ear and left eye. Says he was told by ENT that this was related to nerve pain from his primary mass. Did not have relief with narcotic pain medication and due to personal history of drug use prefers to minimize narcotic medications.     ECOG PS- 1 Pain scale- 6 Opioid associated constipation- no  Review of systems- Review of Systems  Constitutional:  Negative for chills, fever, malaise/fatigue and weight loss.  HENT:  Negative for congestion, ear discharge and nosebleeds.        Throat pain  Eyes:  Negative for blurred vision.  Respiratory:  Negative for cough, hemoptysis, sputum production, shortness of breath and wheezing.   Cardiovascular:  Negative for chest pain, palpitations, orthopnea and claudication.  Gastrointestinal:  Negative for abdominal pain, blood in stool, constipation, diarrhea, heartburn, melena, nausea and vomiting.  Genitourinary:  Negative for dysuria, flank pain, frequency, hematuria and urgency.  Musculoskeletal:  Negative for back pain, joint pain and myalgias.  Skin:  Negative for rash.  Neurological:  Negative for dizziness, tingling, focal weakness, seizures, weakness and headaches.   Endo/Heme/Allergies:  Does not bruise/bleed easily.  Psychiatric/Behavioral:  Negative for depression and suicidal ideas. The patient does not have insomnia.       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[facility])      Past Surgical History:  Procedure Laterality Date   LARYNGOSCOPY Left 01/24/2022   Procedure: DIRECT LARYNGOSCOPY WITH TONSIL BIOPSY;  Surgeon: Beverly Gust, MD;  Location: [facility] ORS;  Service: ENT;  Laterality: Left;   PORT-A-CATH REMOVAL N/A 02/04/2021   Procedure: REMOVAL PORT-A-CATH;  Surgeon: Olean Ree, MD;  Location: [facility] ORS;  Service: General;  Laterality: N/A;   PORTA CATH INSERTION N/A 10/25/2020   Procedure: PORTA CATH INSERTION;  Surgeon: Algernon Huxley, MD;  Location: [place] [facility];  Service: Cardiovascular;  Laterality: N/A;   PORTA CATH INSERTION N/A 02/09/2022   Procedure: PORTA CATH INSERTION;  Surgeon: Algernon Huxley, MD;  Location: [place] [facility];  Service: Cardiovascular;  Laterality: N/A;   REMOVAL OF GASTROSTOMY TUBE N/A 02/04/2021   Procedure: REMOVAL OF GASTROSTOMY TUBE;  Surgeon: Olean Ree, MD;  Location: [facility];  Service: General;  Laterality: N/A;   TONGUE BIOPSY Left 01/24/2022   Procedure: TONGUE BIOPSY;  Surgeon: Beverly Gust, MD;  Location: [facility] ORS;  Service: ENT;  Laterality: Left;    Social History   Socioeconomic History   Marital status: Single    Spouse name: Not on file   Number of children: Not on file   Years of education: Not on file   Highest education level: Not on file  Occupational History   Not on file  Tobacco Use   Smoking status: Every Day    Packs/day: 1.50    Years: 35.00    Pack years: 52.50    Types: Cigarettes   Smokeless tobacco: Current   Tobacco comments:    Patches very rarely  Vaping Use   Vaping Use: Former  Substance and Sexual Activity   Alcohol use: Yes    Comment: occ   Drug use: Yes     Types: Marijuana, "Crack" cocaine, Methamphetamines    Comment: marijuana every day   Sexual activity: Not Currently  Other Topics Concern   Not on file  Social History Narrative   Not on file   Social Determinants of Health   Financial Resource Strain: Not on file  Food Insecurity: Not on file  Transportation Needs: Not on file  Physical Activity: Not on file  Stress: Not on file  Social Connections: Not on file  Intimate Partner Violence: Not on file    Family History  Problem Relation Age of Onset   Cancer Father      Current Outpatient Medications:    calcium carbonate (TUMS - DOSED IN MG ELEMENTAL CALCIUM) 500 MG chewable tablet, Chew 1 tablet by mouth as needed for indigestion or heartburn. (Patient not taking: Reported on 03/06/2022), Disp: , Rfl:    dexamethasone (DECADRON) 4 MG tablet, Take 2 tablets (8 mg total) by mouth daily. Take daily x 3 days starting the day after cisplatin chemotherapy. Take with food., Disp: 30 tablet, Rfl: 1   gabapentin (NEURONTIN) 300 MG capsule, Take 1 capsule (300  mg total) by mouth 3 (three) times daily., Disp: 90 capsule, Rfl: 1   lansoprazole (PREVACID) 30 MG capsule, Take 1 capsule (30 mg total) by mouth daily at 12 noon., Disp: 90 capsule, Rfl: 3   lidocaine-prilocaine (EMLA) cream, Apply to affected area once, Disp: 30 g, Rfl: 3   LORazepam (ATIVAN) 0.5 MG tablet, Take 1 tablet (0.5 mg total) by mouth daily. Once a day prior to each radiation treatment, Disp: 30 tablet, Rfl: 0   naproxen (NAPROSYN) 500 MG tablet, Take 1 tablet (500 mg total) by mouth 2 (two) times daily with a meal. (Patient not taking: Reported on 02/01/2022), Disp: 20 tablet, Rfl: 0   ondansetron (ZOFRAN) 8 MG tablet, Take 1 tablet (8 mg total) by mouth 2 (two) times daily as needed. Start on the third day after cisplatin chemotherapy. (Patient not taking: Reported on 02/13/2022), Disp: 30 tablet, Rfl: 1   Oxycodone HCl 10 MG TABS, Take 1 tablet (10 mg total) by mouth  every 4 (four) hours as needed., Disp: 120 tablet, Rfl: 0   prochlorperazine (COMPAZINE) 10 MG tablet, Take 1 tablet (10 mg total) by mouth every 6 (six) hours as needed (Nausea or vomiting)., Disp: 30 tablet, Rfl: 1   sucralfate (CARAFATE) 1 g tablet, Take 1 tablet (1 g total) by mouth 3 (three) times daily before meals., Disp: 90 tablet, Rfl: 6 No current facility-administered medications for this visit.  Facility-Administered Medications Ordered in Other Visits:    heparin lock flush 100 UNIT/ML injection, , , ,   Physical exam:  Vitals:   03/22/22 1359  BP: 134/83  Pulse: 76  Resp: 16  Temp: 98.4 F (36.9 C)  TempSrc: Tympanic  SpO2: 100%  Weight: 141 lb (64 kg)   Physical Exam Constitutional:      General: He is not in acute distress. HENT:     Mouth/Throat:     Pharynx: Oropharynx is clear.     Comments: Grade 1 mucositis with confluent areas of erythema but no ulceration noted Cardiovascular:     Rate and Rhythm: Normal rate and regular rhythm.     Heart sounds: Normal heart sounds.  Pulmonary:     Effort: Pulmonary effort is normal.     Breath sounds: Normal breath sounds.  Abdominal:     General: Bowel sounds are normal.     Palpations: Abdomen is soft.  Skin:    General: Skin is warm and dry.  Neurological:     Mental Status: He is alert and oriented to person, place, and time.        Latest Ref Rng & Units 03/22/2022    1:21 PM  CMP  Glucose 70 - 99 mg/dL 92    BUN 6 - 20 mg/dL 20    Creatinine 0.61 - 1.24 mg/dL 0.88    Sodium 135 - 145 mmol/L 133    Potassium 3.5 - 5.1 mmol/L 4.1    Chloride 98 - 111 mmol/L 98    CO2 22 - 32 mmol/L 27    Calcium 8.9 - 10.3 mg/dL 8.9        Latest Ref Rng & Units 03/22/2022    1:21 PM  CBC  WBC 4.0 - 10.5 K/uL 3.8    Hemoglobin 13.0 - 17.0 g/dL 13.5    Hematocrit 39.0 - 52.0 % 36.2    Platelets 150 - 400 K/uL 159      No images are attached to the encounter.  No results found.   Assessment  and plan- Patient  is a 51 y.o. male with   stage I HPV positive squamous cell carcinoma of the oropharynx/left base of tongue T2 N1 M0. He is s/p 5 cycles of weekly cisplatin chemotherapy with concurrent radiation treatment.  Chemo was stopped prematurely due to worsening mucositis and patient completed radiation treatment in April 2022.  He now has noted blood and biopsy-proven locally recurrent disease.  He is here for on treatment assessment prior to cycle 5 of weekly cisplatin chemotherapy. He missed a week of radiation and chemotherapy due to family obligations out of town. He has tolerated treatment moderately otherwise. Plan is for total of 7 cycles. Labs reviewed today and acceptable for continuation of treatment. He will rtc tomorrow for port access and cycle 5 of weekly cisplatin.  Radiation-induced mucositis: improved with 1 week off from treatment. Continue oral hygiene, magic mouth wash and sucralfate. He was able to tolerate solid food yesterday. He has declined feeding tube.  Neoplasm related pain- Continue oxycodone 10 mg every 4 hours as needed. PDMP reviewed. He has complex pain which has neuropathic component. No significant improvement with narcotic pain medications or gabapentin. He has history of drug abuse and prefers to minimize narcotics. Recommend referral with palliative care. Patient agrees.  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 Well controlled with lorazepam 0.5 mg prior to radiation. Refilled today.  Port-a-cath: functioning appropriately.   Disposition: Deaccess port Rtc tomorrow for port & cisplatin 1 week- day 1- labs (port or peripheral based on pt preference) Day 2- port & cisplatin Refer to palliative care- [place]   Visit Diagnosis 1. Squamous cell carcinoma of oropharynx ([place])   2. Encounter for antineoplastic chemotherapy   3. Anxiety   4. Mucositis due to radiation therapy   5. Cancer associated pain    Beckey Rutter, DNP, AGNP-C [facility] [phone] (clinic)

## 2022-03-23 ENCOUNTER — Inpatient Hospital Stay (HOSPITAL_BASED_OUTPATIENT_CLINIC_OR_DEPARTMENT_OTHER): Payer: Medicaid Other | Admitting: Hospice and Palliative Medicine

## 2022-03-23 ENCOUNTER — Inpatient Hospital Stay: Payer: Medicaid Other

## 2022-03-23 ENCOUNTER — Ambulatory Visit
Admission: RE | Admit: 2022-03-23 | Discharge: 2022-03-23 | Disposition: A | Discharge status: Home / Self Care | Payer: Medicaid Other | Source: Ambulatory Visit | Attending: Radiation Oncology | Admitting: Radiation Oncology

## 2022-03-23 ENCOUNTER — Other Ambulatory Visit: Payer: Self-pay

## 2022-03-23 VITALS — BP 131/82 | HR 72 | Temp 97.7°F | Resp 16 | Ht 68.5 in | Wt 141.0 lb

## 2022-03-23 DIAGNOSIS — C109 Malignant neoplasm of oropharynx, unspecified: Secondary | ICD-10-CM

## 2022-03-23 DIAGNOSIS — Z515 Encounter for palliative care: Secondary | ICD-10-CM | POA: Diagnosis not present

## 2022-03-23 DIAGNOSIS — G893 Neoplasm related pain (acute) (chronic): Secondary | ICD-10-CM | POA: Diagnosis not present

## 2022-03-23 DIAGNOSIS — Z5111 Encounter for antineoplastic chemotherapy: Secondary | ICD-10-CM | POA: Diagnosis not present

## 2022-03-23 LAB — RAD ONC ARIA SESSION SUMMARY
Course Elapsed Days: 38
Plan Fractions Treated to Date: 23
Plan Prescribed Dose Per Fraction: 2 Gy
Plan Total Fractions Prescribed: 30
Plan Total Prescribed Dose: 60 Gy
Reference Point Dosage Given to Date: 46 Gy
Reference Point Session Dosage Given: 2 Gy
Session Number: 23

## 2022-03-23 MED ORDER — SODIUM CHLORIDE 0.9 % IV SOLN
40.0000 mg/m2 | Freq: Once | INTRAVENOUS | Status: AC
  Administered 2022-03-23: 73 mg via INTRAVENOUS
  Filled 2022-03-23: qty 73

## 2022-03-23 MED ORDER — GABAPENTIN 300 MG PO CAPS: 300.0000 mg | ORAL_CAPSULE | Freq: Three times a day (TID) | ORAL | 1 refills | Status: DC

## 2022-03-23 MED ORDER — SODIUM CHLORIDE 0.9 % IV SOLN
10.0000 mg | Freq: Once | INTRAVENOUS | Status: AC
  Administered 2022-03-23: 10 mg via INTRAVENOUS
  Filled 2022-03-23: qty 10
  Filled 2022-03-23: qty 1

## 2022-03-23 MED ORDER — SODIUM CHLORIDE 0.9 % IV SOLN
Freq: Once | INTRAVENOUS | Status: AC
  Filled 2022-03-23: qty 250

## 2022-03-23 MED ORDER — HEPARIN SOD (PORK) LOCK FLUSH 100 UNIT/ML IV SOLN
INTRAVENOUS | Status: AC
  Filled 2022-03-23: qty 5

## 2022-03-23 MED ORDER — POTASSIUM CHLORIDE IN NACL 20-0.9 MEQ/L-% IV SOLN
Freq: Once | INTRAVENOUS | Status: AC
  Filled 2022-03-23: qty 1000

## 2022-03-23 MED ORDER — SODIUM CHLORIDE 0.9 % IV SOLN
150.0000 mg | Freq: Once | INTRAVENOUS | Status: AC
  Administered 2022-03-23: 150 mg via INTRAVENOUS
  Filled 2022-03-23: qty 5
  Filled 2022-03-23: qty 150

## 2022-03-23 MED ORDER — MAGNESIUM SULFATE 2 GM/50ML IV SOLN
2.0000 g | Freq: Once | INTRAVENOUS | Status: AC
  Administered 2022-03-23: 2 g via INTRAVENOUS
  Filled 2022-03-23: qty 50

## 2022-03-23 MED ORDER — PALONOSETRON HCL INJECTION 0.25 MG/5ML
0.2500 mg | Freq: Once | INTRAVENOUS | Status: AC
  Administered 2022-03-23: 0.25 mg via INTRAVENOUS
  Filled 2022-03-23: qty 5

## 2022-03-23 MED ORDER — DULOXETINE HCL 30 MG PO CPEP: 30.0000 mg | ORAL_CAPSULE | Freq: Every day | ORAL | 1 refills | Status: DC

## 2022-03-23 MED ORDER — LIDOCAINE VISCOUS HCL 2 % MT SOLN: 15.0000 mL | Freq: Four times a day (QID) | OROMUCOSAL | 1 refills | Status: DC | PRN

## 2022-03-23 MED ORDER — HEPARIN SOD (PORK) LOCK FLUSH 100 UNIT/ML IV SOLN
500.0000 [IU] | Freq: Once | INTRAVENOUS | Status: AC | PRN
  Administered 2022-03-23: 500 [IU]
  Filled 2022-03-23: qty 5

## 2022-03-23 NOTE — Progress Notes (Signed)
[place] [facility] [phone] [phone]   Name: Paul Hebert Date: 03/23/2022 MRN: [id number]  DOB: Nov 26, 1970  Patient Care Team: Default, Provider, MD as PCP - General Noreene Filbert, MD as Referring Physician (Radiation Oncology) Sindy Guadeloupe, MD as Consulting Physician (Oncology) Jules Husbands, MD as Consulting Physician (General Surgery)    REASON FOR CONSULTATION: Paul Hebert is a 51 y.o. 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  Patient had local recurrence and is again on concurrent chemoradiation.  He has had radiation-induced mucositis and neoplasm related pain.  Palliative care was consulted to help address goals and manage ongoing symptoms.  SOCIAL HISTORY:     reports that he has been smoking cigarettes. He has a 52.50 pack-year smoking history. He uses smokeless tobacco. He reports current alcohol use. He reports current drug use. Drugs: Marijuana, "Crack" cocaine, and Methamphetamines.  The patient lives at home with his fiance.  He has no children.  He previously worked as a Water quality scientist.   ADVANCE DIRECTIVES:  Does not have  CODE STATUS:   PAST MEDICAL HISTORY: Past Medical History:  Diagnosis Date   GERD (gastroesophageal reflux disease)    Tonsil cancer ([place])     PAST SURGICAL HISTORY:  Past Surgical History:  Procedure Laterality Date   LARYNGOSCOPY Left 01/24/2022   Procedure: DIRECT LARYNGOSCOPY WITH TONSIL BIOPSY;  Surgeon: Beverly Gust, MD;  Location: [facility];  Service: ENT;  Laterality: Left;   PORT-A-CATH REMOVAL N/A 02/04/2021   Procedure: REMOVAL PORT-A-CATH;  Surgeon: Olean Ree, MD;  Location: [facility];  Service: General;  Laterality: N/A;   PORTA CATH INSERTION N/A 10/25/2020    Procedure: PORTA CATH INSERTION;  Surgeon: Algernon Huxley, MD;  Location: [place] [facility];  Service: Cardiovascular;  Laterality: N/A;   PORTA CATH INSERTION N/A 02/09/2022   Procedure: PORTA CATH INSERTION;  Surgeon: Algernon Huxley, MD;  Location: [place] [facility];  Service: Cardiovascular;  Laterality: N/A;   REMOVAL OF GASTROSTOMY TUBE N/A 02/04/2021   Procedure: REMOVAL OF GASTROSTOMY TUBE;  Surgeon: Olean Ree, MD;  Location: [facility];  Service: General;  Laterality: N/A;   TONGUE BIOPSY Left 01/24/2022   Procedure: TONGUE BIOPSY;  Surgeon: Beverly Gust, MD;  Location: [facility] OR[facility];  Service: ENT;  Laterality: Left;    HEMATOLOGY/ONCOLOGY HISTORY:  Oncology History  Squamous cell carcinoma of oropharynx ([place])  10/18/2020 Initial Diagnosis   Squamous cell carcinoma of oropharynx ([place])   10/18/2020 Cancer Staging   Staging form: Pharynx - HPV-Mediated Oropharynx, AJCC 8th Edition - Clinical stage from 10/18/2020: Stage I (cT2, cN1, cM0) - Signed by Sindy Guadeloupe, MD on 10/18/2020   11/01/2020 - 12/20/2020 Chemotherapy   Patient is on Treatment Plan : HEAD/NECK Cisplatin q7d     02/13/2022 -  Chemotherapy   Patient is on Treatment Plan : HEAD/NECK Cisplatin q7d       ALLERGIES:  has No Known Allergies.  MEDICATIONS:  Current Outpatient Medications  Medication Sig Dispense Refill   calcium carbonate (TUMS - DOSED IN MG ELEMENTAL CALCIUM) 500 MG chewable tablet Chew 1 tablet by mouth as needed for indigestion or heartburn. (Patient not taking: Reported on 03/06/2022)     dexamethasone (DECADRON) 4 MG tablet Take 2 tablets (8 mg total) by mouth  daily. Take daily x 3 days starting the day after cisplatin chemotherapy. Take with food. 30 tablet 1   gabapentin (NEURONTIN) 300 MG capsule Take 1 capsule (300 mg total) by mouth 3 (three) times daily. 90 capsule 1   lansoprazole (PREVACID) 30 MG capsule Take 1 capsule (30 mg total) by mouth daily at 12 noon. 90 capsule 3   lidocaine-prilocaine  (EMLA) cream Apply to affected area once 30 g 3   LORazepam (ATIVAN) 0.5 MG tablet Take 1 tablet (0.5 mg total) by mouth daily. Once a day prior to each radiation treatment 30 tablet 0   naproxen (NAPROSYN) 500 MG tablet Take 1 tablet (500 mg total) by mouth 2 (two) times daily with a meal. (Patient not taking: Reported on 02/01/2022) 20 tablet 0   ondansetron (ZOFRAN) 8 MG tablet Take 1 tablet (8 mg total) by mouth 2 (two) times daily as needed. Start on the third day after cisplatin chemotherapy. (Patient not taking: Reported on 02/13/2022) 30 tablet 1   Oxycodone HCl 10 MG TABS Take 1 tablet (10 mg total) by mouth every 4 (four) hours as needed. 120 tablet 0   prochlorperazine (COMPAZINE) 10 MG tablet Take 1 tablet (10 mg total) by mouth every 6 (six) hours as needed (Nausea or vomiting). 30 tablet 1   sucralfate (CARAFATE) 1 g tablet Take 1 tablet (1 g total) by mouth 3 (three) times daily before meals. 90 tablet 6   No current facility-administered medications for this visit.   Facility-Administered Medications Ordered in Other Visits  Medication Dose Route Frequency Provider Last Rate Last Admin   CISplatin (PLATINOL) 73 mg in sodium chloride 0.9 % 250 mL chemo infusion  40 mg/m2 (Treatment Plan Recorded) Intravenous Once Sindy Guadeloupe, MD       dexamethasone (DECADRON) 10 mg in sodium chloride 0.9 % 50 mL IVPB  10 mg Intravenous Once Sindy Guadeloupe, MD       fosaprepitant (EMEND) 150 mg in sodium chloride 0.9 % 145 mL IVPB  150 mg Intravenous Once Sindy Guadeloupe, MD       heparin lock flush 100 UNIT/ML injection            heparin lock flush 100 UNIT/ML injection            heparin lock flush 100 unit/mL  500 Units Intracatheter Once PRN Sindy Guadeloupe, MD       magnesium sulfate IVPB 2 g 50 mL  2 g Intravenous Once Sindy Guadeloupe, MD 50 mL/hr at 03/23/22 0830 2 g at 03/23/22 0830   palonosetron (ALOXI) injection 0.25 mg  0.25 mg Intravenous Once Sindy Guadeloupe, MD        VITAL  SIGNS: There were no vitals taken for this visit. There were no vitals filed for this visit.  Estimated body mass index is 21.13 kg/m as calculated from the following:   Height as of an earlier encounter on 03/23/22: 5' 8.5" (1.74 m).   Weight as of an earlier encounter on 03/23/22: 141 lb (64 kg).  LABS: CBC:    Component Value Date/Time   WBC 3.8 (L) 03/22/2022 1321   HGB 13.5 03/22/2022 1321   HCT 36.2 (L) 03/22/2022 1321   PLT 159 03/22/2022 1321   MCV 92.3 03/22/2022 1321   NEUTROABS 1.9 03/22/2022 1321   LYMPHSABS 1.5 03/22/2022 1321   MONOABS 0.4 03/22/2022 1321   EOSABS 0.1 03/22/2022 1321   BASOSABS 0.0 03/22/2022 1321   Comprehensive Metabolic  Panel:    Component Value Date/Time   NA 133 (L) 03/22/2022 1321   K 4.1 03/22/2022 1321   CL 98 03/22/2022 1321   CO2 27 03/22/2022 1321   BUN 20 03/22/2022 1321   CREATININE 0.88 03/22/2022 1321   GLUCOSE 92 03/22/2022 1321   CALCIUM 8.9 03/22/2022 1321   AST 20 03/06/2022 0809   ALT 15 03/06/2022 0809   ALKPHOS 58 03/06/2022 0809   BILITOT 0.5 03/06/2022 0809   PROT 7.6 03/06/2022 0809   ALBUMIN 4.4 03/06/2022 0809    RADIOGRAPHIC STUDIES: No results found.  PERFORMANCE STATUS (ECOG) : 1 - Symptomatic but completely ambulatory  Review of Systems Unless otherwise noted, a complete review of systems is negative.  Physical Exam General: NAD HEENT: No active oral lesions Pulmonary: Unlabored Extremities: no edema, no joint deformities Skin: no rashes Neurological: Grossly nonfocal  IMPRESSION: I met with patient in infusion.  Patient reports that he has fairly persistent pain in the left side of his mouth radiating up into his ear.  He has the oxycodone but says that he has not taken any since Saturday.  He says that the oxycodone helps but that he is trying to sparingly use those due to concern that they will trigger relapse of addiction.  Patient admits to history of polysubstance abuse including heavy  drinking, methamphetamine, cocaine use.  However, he says that when his cancer recurred, he stopped using substances other than occasional THC.  He also says that he will occasionally drink a couple of beers about once a week.  Patient has also been prescribed gabapentin but he says that he has not taken that in several days.  He requests a refill (last refilled in Feb 2022).  He says that the gabapentin helps but he does not feel like the dose is strong enough. He is not consistently taking the gabapentin. I encouraged consistent dosing.   Patient is also on Carafate and he feels like that also helps until a point where he eats or drinks.  It does sound as if there is a neuropathic component to patient's pain.  We will refill his gabapentin and start him on duloxetine.  We will try to obtain viscous lidocaine oral rinse, although this has been recently on national backorder.  Can consider dose titration of gabapentin if needed.  We will try to minimize opioids if possible.  I would recommend establishing a pain contract prior to next refill of the oxycodone.  Additionally, I would recommend random UDS.   Fortunately, patient has almost completed chemotherapy and radiation.  I would anticipate pain improving posttreatment.  I did discuss the option of referral to an addiction specialist to prevent relapse. Could consider [facility] if patient is in agreement. I will consult our SW to meet patient.   PLAN: -Continue current scope of treatment -Refill gabapentin -Start duloxetine 30 mg daily -Recommend sparing use of opioids -Recommend establishing pain contract and random UDS -SW consult   Patient expressed understanding and was in agreement with this plan. He also understands that He can call the clinic at any time with any questions, concerns, or complaints.     Time Total: 25  Visit consisted of counseling and education dealing with the complex and emotionally intense issues of symptom  management and palliative care in the setting of serious and potentially life-threatening illness.Greater than 50%  of this time was spent counseling and coordinating care related to the above assessment and plan.  Signed by: Altha Harm,  PhD, NP-C

## 2022-03-23 NOTE — Patient Instructions (Signed)
[facility] CANCER CTR AT [place]  Discharge Instructions: Thank you for choosing [facility] [place] to provide your oncology and hematology care.  If you have a lab appointment with the [facility], please go directly to the [facility] and check in at the registration area.  Wear comfortable clothing and clothing appropriate for easy access to any Portacath or PICC line.   We strive to give you quality time with your provider. You may need to reschedule your appointment if you arrive late (15 or more minutes).  Arriving late affects you and other patients whose appointments are after yours.  Also, if you miss three or more appointments without notifying the office, you may be dismissed from the clinic at the provider's discretion.      For prescription refill requests, have your pharmacy contact our office and allow 72 hours for refills to be completed.    Today you received the following chemotherapy and/or immunotherapy agents Cisplatin      To help prevent nausea and vomiting after your treatment, we encourage you to take your nausea medication as directed.  BELOW ARE SYMPTOMS THAT SHOULD BE REPORTED IMMEDIATELY: *FEVER GREATER THAN 100.4 F (38 C) OR HIGHER *CHILLS OR SWEATING *NAUSEA AND VOMITING THAT IS NOT CONTROLLED WITH YOUR NAUSEA MEDICATION *UNUSUAL SHORTNESS OF BREATH *UNUSUAL BRUISING OR BLEEDING *URINARY PROBLEMS (pain or burning when urinating, or frequent urination) *BOWEL PROBLEMS (unusual diarrhea, constipation, pain near the anus) TENDERNESS IN MOUTH AND THROAT WITH OR WITHOUT PRESENCE OF ULCERS (sore throat, sores in mouth, or a toothache) UNUSUAL RASH, SWELLING OR PAIN  UNUSUAL VAGINAL DISCHARGE OR ITCHING   Items with * indicate a potential emergency and should be followed up as soon as possible or go to the Emergency Department if any problems should occur.  Please show the CHEMOTHERAPY ALERT CARD or IMMUNOTHERAPY ALERT CARD at check-in to  the Emergency Department and triage nurse.  Should you have questions after your visit or need to cancel or reschedule your appointment, please contact [facility] CANCER [place] AT [place]  [phone] and follow the prompts.  Office hours are 8:00 a.m. to 4:30 p.m. Monday - Friday. Please note that voicemails left after 4:00 p.m. may not be returned until the following business day.  We are closed weekends and major holidays. You have access to a nurse at all times for urgent questions. Please call the main number to the clinic [phone] and follow the prompts.  For any non-urgent questions, you may also contact your provider using MyChart. We now offer e-Visits for anyone 72 and older to request care online for non-urgent symptoms. For details visit [email or web address].   Also download the MyChart app! Go to the app store, search "MyChart", open the app, select [place], and log in with your MyChart username and password.  Due to Covid, a mask is required upon entering the hospital/clinic. If you do not have a mask, one will be given to you upon arrival. For doctor visits, patients may have 1 support person aged 58 or older with them. For treatment visits, patients cannot have anyone with them due to current Covid guidelines and our immunocompromised population.

## 2022-03-24 ENCOUNTER — Ambulatory Visit
Admission: RE | Admit: 2022-03-24 | Discharge: 2022-03-24 | Disposition: A | Discharge status: Home / Self Care | Payer: Medicaid Other | Source: Ambulatory Visit | Attending: Radiation Oncology | Admitting: Radiation Oncology

## 2022-03-24 ENCOUNTER — Other Ambulatory Visit: Payer: Self-pay

## 2022-03-24 DIAGNOSIS — Z5111 Encounter for antineoplastic chemotherapy: Secondary | ICD-10-CM | POA: Diagnosis not present

## 2022-03-24 LAB — RAD ONC ARIA SESSION SUMMARY
Course Elapsed Days: 39
Plan Fractions Treated to Date: 24
Plan Prescribed Dose Per Fraction: 2 Gy
Plan Total Fractions Prescribed: 30
Plan Total Prescribed Dose: 60 Gy
Reference Point Dosage Given to Date: 48 Gy
Reference Point Session Dosage Given: 2 Gy
Session Number: 24

## 2022-03-27 ENCOUNTER — Encounter: Payer: Self-pay | Admitting: Licensed Clinical Social Worker

## 2022-03-27 ENCOUNTER — Other Ambulatory Visit: Payer: Self-pay

## 2022-03-27 ENCOUNTER — Ambulatory Visit: Payer: Medicaid Other

## 2022-03-27 ENCOUNTER — Ambulatory Visit
Admission: RE | Admit: 2022-03-27 | Discharge: 2022-03-27 | Disposition: A | Discharge status: Home / Self Care | Payer: Medicaid Other | Source: Ambulatory Visit | Attending: Radiation Oncology | Admitting: Radiation Oncology

## 2022-03-27 DIAGNOSIS — Z5111 Encounter for antineoplastic chemotherapy: Secondary | ICD-10-CM | POA: Diagnosis not present

## 2022-03-27 LAB — RAD ONC ARIA SESSION SUMMARY
Course Elapsed Days: 42
Plan Fractions Treated to Date: 25
Plan Prescribed Dose Per Fraction: 2 Gy
Plan Total Fractions Prescribed: 30
Plan Total Prescribed Dose: 60 Gy
Reference Point Dosage Given to Date: 50 Gy
Reference Point Session Dosage Given: 2 Gy
Session Number: 25

## 2022-03-27 NOTE — Progress Notes (Signed)
[place] Work  Clinical Social Work was referred by medical provider for assessment of psychosocial needs.  Clinical Social Worker contacted patient by phone  to offer support and assess for needs.  CSW left voicemail with contact information and request for return call.  Patient's preferred contact number [phone] [facility]) is not in service.  CSW left voicemail at [phone] [facility])   First Attempt  Adelene Amas, LCSW  Clinical Social Worker [place]        Patient is participating in a Managed Medicaid Plan:  Yes

## 2022-03-28 ENCOUNTER — Ambulatory Visit: Payer: Medicaid Other

## 2022-03-28 ENCOUNTER — Other Ambulatory Visit: Payer: Self-pay | Admitting: *Deleted

## 2022-03-28 ENCOUNTER — Other Ambulatory Visit: Payer: Self-pay

## 2022-03-28 DIAGNOSIS — Z5111 Encounter for antineoplastic chemotherapy: Secondary | ICD-10-CM | POA: Diagnosis not present

## 2022-03-28 LAB — RAD ONC ARIA SESSION SUMMARY
Course Elapsed Days: 43
Plan Fractions Treated to Date: 26
Plan Prescribed Dose Per Fraction: 2 Gy
Plan Total Fractions Prescribed: 30
Plan Total Prescribed Dose: 60 Gy
Reference Point Dosage Given to Date: 52 Gy
Reference Point Session Dosage Given: 2 Gy
Session Number: 26

## 2022-03-28 MED ORDER — LANSOPRAZOLE 30 MG PO CPDR: 30.0000 mg | DELAYED_RELEASE_CAPSULE | Freq: Every day | ORAL | 3 refills | Status: DC

## 2022-03-28 MED ORDER — DEXAMETHASONE 4 MG PO TABS: 4.0000 mg | ORAL_TABLET | Freq: Two times a day (BID) | ORAL | 0 refills | Status: DC

## 2022-03-29 ENCOUNTER — Inpatient Hospital Stay: Payer: Medicaid Other

## 2022-03-29 ENCOUNTER — Inpatient Hospital Stay (HOSPITAL_BASED_OUTPATIENT_CLINIC_OR_DEPARTMENT_OTHER): Payer: Medicaid Other | Admitting: Hospice and Palliative Medicine

## 2022-03-29 ENCOUNTER — Encounter: Payer: Self-pay | Admitting: Hospice and Palliative Medicine

## 2022-03-29 ENCOUNTER — Telehealth: Payer: Self-pay

## 2022-03-29 ENCOUNTER — Ambulatory Visit: Payer: Medicaid Other

## 2022-03-29 VITALS — BP 148/94 | HR 101 | Temp 98.6°F | Resp 17 | Wt 141.0 lb

## 2022-03-29 DIAGNOSIS — K1230 Oral mucositis (ulcerative), unspecified: Secondary | ICD-10-CM | POA: Diagnosis not present

## 2022-03-29 DIAGNOSIS — C109 Malignant neoplasm of oropharynx, unspecified: Secondary | ICD-10-CM

## 2022-03-29 DIAGNOSIS — Z5111 Encounter for antineoplastic chemotherapy: Secondary | ICD-10-CM | POA: Diagnosis not present

## 2022-03-29 LAB — CBC WITH DIFFERENTIAL/PLATELET
Abs Immature Granulocytes: 0.02 10*3/uL (ref 0.00–0.07)
Basophils Absolute: 0 10*3/uL (ref 0.0–0.1)
Basophils Relative: 0 %
Eosinophils Absolute: 0 10*3/uL (ref 0.0–0.5)
Eosinophils Relative: 0 %
HCT: 36.7 % — ABNORMAL LOW (ref 39.0–52.0)
Hemoglobin: 13.7 g/dL (ref 13.0–17.0)
Immature Granulocytes: 0 %
Lymphocytes Relative: 15 %
Lymphs Abs: 0.7 10*3/uL (ref 0.7–4.0)
MCH: 34.4 pg — ABNORMAL HIGH (ref 26.0–34.0)
MCHC: 37.3 g/dL — ABNORMAL HIGH (ref 30.0–36.0)
MCV: 92.2 fL (ref 80.0–100.0)
Monocytes Absolute: 0.4 10*3/uL (ref 0.1–1.0)
Monocytes Relative: 7 %
Neutro Abs: 3.7 10*3/uL (ref 1.7–7.7)
Neutrophils Relative %: 78 %
Platelets: 274 10*3/uL (ref 150–400)
RBC: 3.98 MIL/uL — ABNORMAL LOW (ref 4.22–5.81)
RDW: 13.7 % (ref 11.5–15.5)
WBC: 4.9 10*3/uL (ref 4.0–10.5)
nRBC: 0 % (ref 0.0–0.2)

## 2022-03-29 LAB — COMPREHENSIVE METABOLIC PANEL
ALT: 11 U/L (ref 0–44)
AST: 15 U/L (ref 15–41)
Albumin: 4.2 g/dL (ref 3.5–5.0)
Alkaline Phosphatase: 61 U/L (ref 38–126)
Anion gap: 8 (ref 5–15)
BUN: 19 mg/dL (ref 6–20)
CO2: 26 mmol/L (ref 22–32)
Calcium: 9.3 mg/dL (ref 8.9–10.3)
Chloride: 97 mmol/L — ABNORMAL LOW (ref 98–111)
Creatinine, Ser: 0.83 mg/dL (ref 0.61–1.24)
GFR, Estimated: >60 mL/min (ref >60)
Glucose, Bld: 129 mg/dL — ABNORMAL HIGH (ref 70–99)
Potassium: 4 mmol/L (ref 3.5–5.1)
Sodium: 131 mmol/L — ABNORMAL LOW (ref 135–145)
Total Bilirubin: 0.8 mg/dL (ref 0.3–1.2)
Total Protein: 7.8 g/dL (ref 6.5–8.1)

## 2022-03-29 LAB — MAGNESIUM: Magnesium: 1.9 mg/dL (ref 1.7–2.4)

## 2022-03-29 MED ORDER — ONDANSETRON HCL 4 MG/2ML IJ SOLN
8.0000 mg | Freq: Once | INTRAMUSCULAR | Status: AC
  Administered 2022-03-29: 8 mg via INTRAVENOUS
  Filled 2022-03-29: qty 4

## 2022-03-29 MED ORDER — SODIUM CHLORIDE 0.9 % IV SOLN
Freq: Once | INTRAVENOUS | Status: AC
  Filled 2022-03-29: qty 250

## 2022-03-29 MED ORDER — MORPHINE SULFATE (PF) 2 MG/ML IV SOLN
4.0000 mg | Freq: Once | INTRAVENOUS | Status: AC
  Administered 2022-03-29: 4 mg via INTRAVENOUS
  Filled 2022-03-29: qty 2

## 2022-03-29 MED ORDER — SODIUM CHLORIDE 0.9% FLUSH
10.0000 mL | Freq: Once | INTRAVENOUS | Status: AC
  Administered 2022-03-29: 10 mL via INTRAVENOUS
  Filled 2022-03-29: qty 10

## 2022-03-29 MED ORDER — FLUCONAZOLE 100 MG PO TABS: 100.0000 mg | ORAL_TABLET | Freq: Every day | ORAL | 0 refills | Status: DC

## 2022-03-29 MED ORDER — SODIUM CHLORIDE 0.9 % IV SOLN: 8.0000 mg | Freq: Once | INTRAVENOUS | Status: DC

## 2022-03-29 MED FILL — Fosaprepitant Dimeglumine For IV Infusion 150 MG (Base Eq): INTRAVENOUS | Qty: 5 | Status: AC

## 2022-03-29 MED FILL — Dexamethasone Sodium Phosphate Inj 100 MG/10ML: INTRAMUSCULAR | Qty: 1 | Status: AC

## 2022-03-29 NOTE — Progress Notes (Signed)
Patient received 1L IVF, Morphine '4mg'$  for throat pain and Zofran '8mg'$ . Patient expresses pain relief from Morphine. Port left accessed for tomorrow's appointment. Patient stable at discharge. Refused AVS.

## 2022-03-29 NOTE — Patient Instructions (Signed)
[facility] CANCER CTR AT [place]  Discharge Instructions: Thank you for choosing [place] [facility] to provide your oncology and hematology care.  If you have a lab appointment with the [facility], please go directly to the [place] and check in at the registration area.  Wear comfortable clothing and clothing appropriate for easy access to any Portacath or PICC line.   We strive to give you quality time with your provider. You may need to reschedule your appointment if you arrive late (15 or more minutes).  Arriving late affects you and other patients whose appointments are after yours.  Also, if you miss three or more appointments without notifying the office, you may be dismissed from the clinic at the provider's discretion.      For prescription refill requests, have your pharmacy contact our office and allow 72 hours for refills to be completed.    Today you received morphine, zofran, and fluids   To help prevent nausea and vomiting after your treatment, we encourage you to take your nausea medication as directed.  BELOW ARE SYMPTOMS THAT SHOULD BE REPORTED IMMEDIATELY: *FEVER GREATER THAN 100.4 F (38 C) OR HIGHER *CHILLS OR SWEATING *NAUSEA AND VOMITING THAT IS NOT CONTROLLED WITH YOUR NAUSEA MEDICATION *UNUSUAL SHORTNESS OF BREATH *UNUSUAL BRUISING OR BLEEDING *URINARY PROBLEMS (pain or burning when urinating, or frequent urination) *BOWEL PROBLEMS (unusual diarrhea, constipation, pain near the anus) TENDERNESS IN MOUTH AND THROAT WITH OR WITHOUT PRESENCE OF ULCERS (sore throat, sores in mouth, or a toothache) UNUSUAL RASH, SWELLING OR PAIN  UNUSUAL VAGINAL DISCHARGE OR ITCHING   Items with * indicate a potential emergency and should be followed up as soon as possible or go to the Emergency Department if any problems should occur.  Please show the CHEMOTHERAPY ALERT CARD or IMMUNOTHERAPY ALERT CARD at check-in to the Emergency Department and triage  nurse.  Should you have questions after your visit or need to cancel or reschedule your appointment, please contact [facility] CANCER [place] AT [place]  [phone] and follow the prompts.  Office hours are 8:00 a.m. to 4:30 p.m. Monday - Friday. Please note that voicemails left after 4:00 p.m. may not be returned until the following business day.  We are closed weekends and major holidays. You have access to a nurse at all times for urgent questions. Please call the main number to the clinic [phone] and follow the prompts.  For any non-urgent questions, you may also contact your provider using MyChart. We now offer e-Visits for anyone 52 and older to request care online for non-urgent symptoms. For details visit [email or web address].   Also download the MyChart app! Go to the app store, search "MyChart", open the app, select [place], and log in with your MyChart username and password.  Masks are optional in the cancer centers. If you would like for your care team to wear a mask while they are taking care of you, please let them know. For doctor visits, patients may have with them one support person who is at least 52 years old. At this time, visitors are not allowed in the infusion area.

## 2022-03-29 NOTE — Progress Notes (Signed)
Patient here for [facility], concerns of uncontrolled throat  pain 8/10, cant keep water or anything down, throat is white pain shoots up to head.

## 2022-03-29 NOTE — Progress Notes (Signed)
Symptom Management [place] at [facility] [phone] [phone]  Patient Care Team: Default, Provider, MD as PCP - General Noreene Filbert, MD as Referring Physician (Radiation Oncology) Sindy Guadeloupe, MD as Consulting Physician (Oncology) Jules Husbands, MD as Consulting Physician (General Surgery)   Name of the patient: Paul Hebert  [id number]  04-07-1971   Date of visit: 03/29/22  Reason for Consult: Paul Hebert is a 51 y.o. 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  Patient had local recurrence and is again on concurrent chemoradiation.  He has had radiation-induced mucositis and neoplasm related pain.   Patient was an add-on to [facility] today for evaluation of oral pain and "white patches".  Patient says that he has had worsening mouth pain over the past week leading to difficulty swallowing.  He denies fever or chills.  He has some nausea but no vomiting.  Patient has been taking oxycodone but has not found it to be particularly effective at reducing his oral pain.  He was unable to get filled the Magic mouthwash.  Patient is using the sucralfate but says that it only helps for about 10 minutes.  Denies any neurologic complaints. Denies recent fevers or illnesses. Denies any easy bleeding or bruising. Denies urinary complaints. Patient offers no further specific complaints today.    PAST MEDICAL HISTORY: Past Medical History:  Diagnosis Date   GERD (gastroesophageal reflux disease)    Tonsil cancer ([place])     PAST SURGICAL HISTORY:  Past Surgical History:  Procedure Laterality Date   LARYNGOSCOPY Left 01/24/2022   Procedure: DIRECT LARYNGOSCOPY WITH TONSIL BIOPSY;  Surgeon: Beverly Gust, MD;  Location: [facility] ORS;  Service: ENT;   Laterality: Left;   PORT-A-CATH REMOVAL N/A 02/04/2021   Procedure: REMOVAL PORT-A-CATH;  Surgeon: Olean Ree, MD;  Location: [facility] ORS;  Service: General;  Laterality: N/A;   PORTA CATH INSERTION N/A 10/25/2020   Procedure: PORTA CATH INSERTION;  Surgeon: Algernon Huxley, MD;  Location: [place] CV LAB;  Service: Cardiovascular;  Laterality: N/A;   PORTA CATH INSERTION N/A 02/09/2022   Procedure: PORTA CATH INSERTION;  Surgeon: Algernon Huxley, MD;  Location: [place] CV LAB;  Service: Cardiovascular;  Laterality: N/A;   REMOVAL OF GASTROSTOMY TUBE N/A 02/04/2021   Procedure: REMOVAL OF GASTROSTOMY TUBE;  Surgeon: Olean Ree, MD;  Location: [facility] ORS;  Service: General;  Laterality: N/A;   TONGUE BIOPSY Left 01/24/2022   Procedure: TONGUE BIOPSY;  Surgeon: Beverly Gust, MD;  Location: [facility] ORS;  Service: ENT;  Laterality: Left;    HEMATOLOGY/ONCOLOGY HISTORY:  Oncology History  Squamous cell carcinoma of oropharynx ([place])  10/18/2020 Initial Diagnosis   Squamous cell carcinoma of oropharynx ([place])   10/18/2020 Cancer Staging   Staging form: Pharynx - HPV-Mediated Oropharynx, AJCC 8th Edition - Clinical stage from 10/18/2020: Stage I (cT2, cN1, cM0) - Signed by Sindy Guadeloupe, MD on 10/18/2020   11/01/2020 - 12/20/2020 Chemotherapy   Patient is on Treatment Plan : HEAD/NECK Cisplatin q7d     02/13/2022 -  Chemotherapy   Patient is on Treatment Plan : HEAD/NECK Cisplatin q7d       ALLERGIES:  has No Known Allergies.  MEDICATIONS:  Current Outpatient Medications  Medication Sig Dispense Refill   calcium carbonate (TUMS - DOSED IN MG ELEMENTAL CALCIUM) 500  MG chewable tablet Chew 1 tablet by mouth as needed for indigestion or heartburn. (Patient not taking: Reported on 03/06/2022)     dexamethasone (DECADRON) 4 MG tablet Take 1 tablet (4 mg total) by mouth 2 (two) times daily with a meal. 25 tablet 0   DULoxetine (CYMBALTA) 30 MG capsule Take 1 capsule (30 mg total) by mouth daily. 30  capsule 1   gabapentin (NEURONTIN) 300 MG capsule Take 1 capsule (300 mg total) by mouth 3 (three) times daily. 90 capsule 1   lansoprazole (PREVACID) 30 MG capsule Take 1 capsule (30 mg total) by mouth daily at 12 noon. 30 capsule 3   lidocaine (XYLOCAINE) 2 % solution Use as directed 15 mLs in the mouth or throat every 6 (six) hours as needed for mouth pain. 100 mL 1   lidocaine-prilocaine (EMLA) cream Apply to affected area once 30 g 3   LORazepam (ATIVAN) 0.5 MG tablet Take 1 tablet (0.5 mg total) by mouth daily. Once a day prior to each radiation treatment 30 tablet 0   naproxen (NAPROSYN) 500 MG tablet Take 1 tablet (500 mg total) by mouth 2 (two) times daily with a meal. (Patient not taking: Reported on 02/01/2022) 20 tablet 0   ondansetron (ZOFRAN) 8 MG tablet Take 1 tablet (8 mg total) by mouth 2 (two) times daily as needed. Start on the third day after cisplatin chemotherapy. (Patient not taking: Reported on 02/13/2022) 30 tablet 1   Oxycodone HCl 10 MG TABS Take 1 tablet (10 mg total) by mouth every 4 (four) hours as needed. 120 tablet 0   prochlorperazine (COMPAZINE) 10 MG tablet Take 1 tablet (10 mg total) by mouth every 6 (six) hours as needed (Nausea or vomiting). 30 tablet 1   sucralfate (CARAFATE) 1 g tablet Take 1 tablet (1 g total) by mouth 3 (three) times daily before meals. 90 tablet 6   No current facility-administered medications for this visit.   Facility-Administered Medications Ordered in Other Visits  Medication Dose Route Frequency Provider Last Rate Last Admin   heparin lock flush 100 UNIT/ML injection            heparin lock flush 100 UNIT/ML injection            sodium chloride flush (NS) 0.9 % injection 10 mL  10 mL Intravenous Once Sindy Guadeloupe, MD        VITAL SIGNS: There were no vitals taken for this visit. There were no vitals filed for this visit.  Estimated body mass index is 21.13 kg/m as calculated from the following:   Height as of 03/23/22: 5' 8.5"  (1.74 m).   Weight as of 03/23/22: 141 lb (64 kg).  LABS: CBC:    Component Value Date/Time   WBC 3.8 (L) 03/22/2022 1321   HGB 13.5 03/22/2022 1321   HCT 36.2 (L) 03/22/2022 1321   PLT 159 03/22/2022 1321   MCV 92.3 03/22/2022 1321   NEUTROABS 1.9 03/22/2022 1321   LYMPHSABS 1.5 03/22/2022 1321   MONOABS 0.4 03/22/2022 1321   EOSABS 0.1 03/22/2022 1321   BASOSABS 0.0 03/22/2022 1321   Comprehensive Metabolic Panel:    Component Value Date/Time   NA 133 (L) 03/22/2022 1321   K 4.1 03/22/2022 1321   CL 98 03/22/2022 1321   CO2 27 03/22/2022 1321   BUN 20 03/22/2022 1321   CREATININE 0.88 03/22/2022 1321   GLUCOSE 92 03/22/2022 1321   CALCIUM 8.9 03/22/2022 1321   AST 20 03/06/2022  0809   ALT 15 03/06/2022 0809   ALKPHOS 58 03/06/2022 0809   BILITOT 0.5 03/06/2022 0809   PROT 7.6 03/06/2022 0809   ALBUMIN 4.4 03/06/2022 0809    RADIOGRAPHIC STUDIES: No results found.  PERFORMANCE STATUS (ECOG) : 1 - Symptomatic but completely ambulatory  Review of Systems Unless otherwise noted, a complete review of systems is negative.  Physical Exam General: NAD HEENT: Erythematous distal OP with mucous and white lesions/thrush Cardiovascular: regular rate and rhythm Pulmonary: clear ant fields Abdomen: soft, nontender, + bowel sounds GU: no suprapubic tenderness Extremities: no edema, no joint deformities Skin: no rashes Neurological: Weakness but otherwise nonfocal  Assessment and Plan- Patient is a 51 y.o. male KARAN RAMNAUTH is a 50 y.o. 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  Patient had local recurrence and is again on concurrent chemoradiation.  He has had radiation-induced mucositis and neoplasm related pain.    Mucositis -Labs grossly unchanged from baseline.  We will give IV fluids  and IV pain meds today.  Viscous lidocaine is still on national backorder.  I called his community pharmacist at [place] and gave a verbal prescription for [facility] mouthwash. I will also start him on oral fluconazole. I also spoke with [facility] in [place] and they were able to compound lidocaine 2% oral rinse.  Continue sucralfate and oxycodone.  Discussed with Dr. Janese Banks who will postpone chemotherapy this week.  Patient would like to continue XRT if possible.  We will plan follow-up on Friday for labs/fluids  Patient expressed understanding and was in agreement with this plan. He also understands that He can call clinic at any time with any questions, concerns, or complaints.   Thank you for allowing me to participate in the care of this very pleasant patient.   Time Total: 20 minutes  Visit consisted of counseling and education dealing with the complex and emotionally intense issues of symptom management in the setting of serious illness.Greater than 50%  of this time was spent counseling and coordinating care related to the above assessment and plan.  Signed by: Altha Harm, PhD, NP-C

## 2022-03-29 NOTE — Telephone Encounter (Signed)
Called to schedule [facility] due to difficulty swallowing/ eating, no answer left voicemail to call office back to schedule with [facility]

## 2022-03-30 ENCOUNTER — Ambulatory Visit: Payer: Medicaid Other

## 2022-03-30 ENCOUNTER — Inpatient Hospital Stay: Payer: Medicaid Other

## 2022-03-31 ENCOUNTER — Ambulatory Visit: Payer: Medicaid Other | Admitting: Hospice and Palliative Medicine

## 2022-03-31 ENCOUNTER — Inpatient Hospital Stay: Payer: Medicaid Other

## 2022-03-31 ENCOUNTER — Ambulatory Visit: Payer: Medicaid Other

## 2022-03-31 ENCOUNTER — Telehealth: Payer: Self-pay | Admitting: *Deleted

## 2022-03-31 ENCOUNTER — Telehealth: Payer: Self-pay

## 2022-03-31 ENCOUNTER — Other Ambulatory Visit: Payer: Self-pay

## 2022-03-31 VITALS — BP 137/94 | HR 79 | Temp 98.9°F | Resp 18 | Wt 141.0 lb

## 2022-03-31 DIAGNOSIS — K1230 Oral mucositis (ulcerative), unspecified: Secondary | ICD-10-CM

## 2022-03-31 DIAGNOSIS — C109 Malignant neoplasm of oropharynx, unspecified: Secondary | ICD-10-CM

## 2022-03-31 DIAGNOSIS — Z5111 Encounter for antineoplastic chemotherapy: Secondary | ICD-10-CM | POA: Diagnosis not present

## 2022-03-31 LAB — COMPREHENSIVE METABOLIC PANEL
ALT: 14 U/L (ref 0–44)
AST: 19 U/L (ref 15–41)
Albumin: 4.3 g/dL (ref 3.5–5.0)
Alkaline Phosphatase: 52 U/L (ref 38–126)
Anion gap: 9 (ref 5–15)
BUN: 31 mg/dL — ABNORMAL HIGH (ref 6–20)
CO2: 26 mmol/L (ref 22–32)
Calcium: 9.2 mg/dL (ref 8.9–10.3)
Chloride: 97 mmol/L — ABNORMAL LOW (ref 98–111)
Creatinine, Ser: 0.83 mg/dL (ref 0.61–1.24)
GFR, Estimated: >60 mL/min (ref >60)
Glucose, Bld: 116 mg/dL — ABNORMAL HIGH (ref 70–99)
Potassium: 4.1 mmol/L (ref 3.5–5.1)
Sodium: 132 mmol/L — ABNORMAL LOW (ref 135–145)
Total Bilirubin: 0.4 mg/dL (ref 0.3–1.2)
Total Protein: 7.5 g/dL (ref 6.5–8.1)

## 2022-03-31 LAB — CBC WITH DIFFERENTIAL/PLATELET
Abs Immature Granulocytes: 0.02 10*3/uL (ref 0.00–0.07)
Basophils Absolute: 0 10*3/uL (ref 0.0–0.1)
Basophils Relative: 0 %
Eosinophils Absolute: 0 10*3/uL (ref 0.0–0.5)
Eosinophils Relative: 0 %
HCT: 34.6 % — ABNORMAL LOW (ref 39.0–52.0)
Hemoglobin: 12.6 g/dL — ABNORMAL LOW (ref 13.0–17.0)
Immature Granulocytes: 0 %
Lymphocytes Relative: 15 %
Lymphs Abs: 1 10*3/uL (ref 0.7–4.0)
MCH: 33.7 pg (ref 26.0–34.0)
MCHC: 36.4 g/dL — ABNORMAL HIGH (ref 30.0–36.0)
MCV: 92.5 fL (ref 80.0–100.0)
Monocytes Absolute: 0.3 10*3/uL (ref 0.1–1.0)
Monocytes Relative: 5 %
Neutro Abs: 5.4 10*3/uL (ref 1.7–7.7)
Neutrophils Relative %: 80 %
Platelets: 267 10*3/uL (ref 150–400)
RBC: 3.74 MIL/uL — ABNORMAL LOW (ref 4.22–5.81)
RDW: 14.3 % (ref 11.5–15.5)
WBC: 6.8 10*3/uL (ref 4.0–10.5)
nRBC: 0 % (ref 0.0–0.2)

## 2022-03-31 LAB — MAGNESIUM: Magnesium: 2.1 mg/dL (ref 1.7–2.4)

## 2022-03-31 MED ORDER — HEPARIN SOD (PORK) LOCK FLUSH 100 UNIT/ML IV SOLN
500.0000 [IU] | Freq: Once | INTRAVENOUS | Status: AC
  Administered 2022-03-31: 500 [IU] via INTRAVENOUS
  Filled 2022-03-31: qty 5

## 2022-03-31 MED ORDER — OXYCODONE HCL 10 MG PO TABS: 10.0000 mg | ORAL_TABLET | ORAL | 0 refills | Status: DC | PRN

## 2022-03-31 MED ORDER — SODIUM CHLORIDE 0.9% FLUSH
10.0000 mL | Freq: Once | INTRAVENOUS | Status: AC
  Administered 2022-03-31: 10 mL via INTRAVENOUS
  Filled 2022-03-31: qty 10

## 2022-03-31 MED ORDER — SODIUM CHLORIDE 0.9 % IV SOLN
Freq: Once | INTRAVENOUS | Status: AC
  Filled 2022-03-31: qty 250

## 2022-03-31 NOTE — Patient Instructions (Signed)
[facility] CANCER CTR AT [place]  Discharge Instructions: Thank you for choosing [facility] to provide your oncology and hematology care.  If you have a lab appointment with the [facility], please go directly to the [place] and check in at the registration area.  Wear comfortable clothing and clothing appropriate for easy access to any Portacath or PICC line.   We strive to give you quality time with your provider. You may need to reschedule your appointment if you arrive late (15 or more minutes).  Arriving late affects you and other patients whose appointments are after yours.  Also, if you miss three or more appointments without notifying the office, you may be dismissed from the clinic at the provider's discretion.      For prescription refill requests, have your pharmacy contact our office and allow 72 hours for refills to be completed.    Today you received IV flluids   To help prevent nausea and vomiting after your treatment, we encourage you to take your nausea medication as directed.  BELOW ARE SYMPTOMS THAT SHOULD BE REPORTED IMMEDIATELY: *FEVER GREATER THAN 100.4 F (38 C) OR HIGHER *CHILLS OR SWEATING *NAUSEA AND VOMITING THAT IS NOT CONTROLLED WITH YOUR NAUSEA MEDICATION *UNUSUAL SHORTNESS OF BREATH *UNUSUAL BRUISING OR BLEEDING *URINARY PROBLEMS (pain or burning when urinating, or frequent urination) *BOWEL PROBLEMS (unusual diarrhea, constipation, pain near the anus) TENDERNESS IN MOUTH AND THROAT WITH OR WITHOUT PRESENCE OF ULCERS (sore throat, sores in mouth, or a toothache) UNUSUAL RASH, SWELLING OR PAIN  UNUSUAL VAGINAL DISCHARGE OR ITCHING   Items with * indicate a potential emergency and should be followed up as soon as possible or go to the Emergency Department if any problems should occur.  Please show the CHEMOTHERAPY ALERT CARD or IMMUNOTHERAPY ALERT CARD at check-in to the Emergency Department and triage nurse.  Should you  have questions after your visit or need to cancel or reschedule your appointment, please contact [facility] CANCER [place] AT [place]  [phone] and follow the prompts.  Office hours are 8:00 a.m. to 4:30 p.m. Monday - Friday. Please note that voicemails left after 4:00 p.m. may not be returned until the following business day.  We are closed weekends and major holidays. You have access to a nurse at all times for urgent questions. Please call the main number to the clinic [phone] and follow the prompts.  For any non-urgent questions, you may also contact your provider using MyChart. We now offer e-Visits for anyone 80 and older to request care online for non-urgent symptoms. For details visit [email or web address].   Also download the MyChart app! Go to the app store, search "MyChart", open the app, select [place], and log in with your MyChart username and password.  Masks are optional in the cancer centers. If you would like for your care team to wear a mask while they are taking care of you, please let them know. For doctor visits, patients may have with them one support person who is at least 52 years old. At this time, visitors are not allowed in the infusion area.

## 2022-03-31 NOTE — Telephone Encounter (Signed)
Pt has not been able to pick up Viscous lidocaine from [facility] in [place]. The cost is $ 55.00. National back order on the lidocaine. [facility] will compound it this afternoon for the patient. Pt does not have the money to pick it up. Josh Borders asked me to have it put under the [employer]. I called [facility] and requested that billing under the [employer]. I gave the pharmacist patients girlfriends cell #[phone]. She will call once she had medication compounded and ready for pick up. Pt aware of above.

## 2022-03-31 NOTE — Progress Notes (Signed)
Patient tolerated 1 L IVFinfusion well, no questions/concerns voiced. Patient stable at discharge. Meds sent to pharmacy. Patient aware. AVS given.

## 2022-03-31 NOTE — Telephone Encounter (Signed)
Called pharmacy to inform them that cover my meds authorization for oxy '10mg'$  as requested by pharmacy was submitted. Pharmacist stated nothing else needed, medication $4 for patient pickup.

## 2022-04-03 ENCOUNTER — Other Ambulatory Visit: Payer: Self-pay

## 2022-04-03 ENCOUNTER — Ambulatory Visit: Payer: Medicaid Other

## 2022-04-03 DIAGNOSIS — Z5111 Encounter for antineoplastic chemotherapy: Secondary | ICD-10-CM | POA: Diagnosis not present

## 2022-04-03 LAB — RAD ONC ARIA SESSION SUMMARY
Course Elapsed Days: 49
Plan Fractions Treated to Date: 27
Plan Prescribed Dose Per Fraction: 2 Gy
Plan Total Fractions Prescribed: 30
Plan Total Prescribed Dose: 60 Gy
Reference Point Dosage Given to Date: 54 Gy
Reference Point Session Dosage Given: 2 Gy
Session Number: 27

## 2022-04-04 ENCOUNTER — Ambulatory Visit: Payer: Medicaid Other

## 2022-04-04 ENCOUNTER — Ambulatory Visit
Admission: RE | Admit: 2022-04-04 | Discharge: 2022-04-04 | Disposition: A | Discharge status: Home / Self Care | Payer: Medicaid Other | Source: Ambulatory Visit | Attending: Radiation Oncology | Admitting: Radiation Oncology

## 2022-04-04 ENCOUNTER — Other Ambulatory Visit: Payer: Self-pay

## 2022-04-04 DIAGNOSIS — Z5111 Encounter for antineoplastic chemotherapy: Secondary | ICD-10-CM | POA: Diagnosis not present

## 2022-04-04 LAB — RAD ONC ARIA SESSION SUMMARY
Course Elapsed Days: 50
Plan Fractions Treated to Date: 28
Plan Prescribed Dose Per Fraction: 2 Gy
Plan Total Fractions Prescribed: 30
Plan Total Prescribed Dose: 60 Gy
Reference Point Dosage Given to Date: 56 Gy
Reference Point Session Dosage Given: 2 Gy
Session Number: 28

## 2022-04-05 ENCOUNTER — Inpatient Hospital Stay: Payer: Medicaid Other

## 2022-04-05 ENCOUNTER — Encounter: Payer: Self-pay | Admitting: Oncology

## 2022-04-05 ENCOUNTER — Ambulatory Visit
Admission: RE | Admit: 2022-04-05 | Discharge: 2022-04-05 | Disposition: A | Discharge status: Home / Self Care | Payer: Medicaid Other | Source: Ambulatory Visit | Attending: Radiation Oncology | Admitting: Radiation Oncology

## 2022-04-05 ENCOUNTER — Other Ambulatory Visit: Payer: Self-pay

## 2022-04-05 ENCOUNTER — Inpatient Hospital Stay (HOSPITAL_BASED_OUTPATIENT_CLINIC_OR_DEPARTMENT_OTHER): Payer: Medicaid Other | Admitting: Oncology

## 2022-04-05 ENCOUNTER — Ambulatory Visit: Payer: Medicaid Other | Admitting: Oncology

## 2022-04-05 VITALS — BP 118/68 | HR 71 | Temp 98.3°F | Resp 16 | Ht 68.5 in | Wt 145.3 lb

## 2022-04-05 DIAGNOSIS — C109 Malignant neoplasm of oropharynx, unspecified: Secondary | ICD-10-CM

## 2022-04-05 DIAGNOSIS — Z5111 Encounter for antineoplastic chemotherapy: Secondary | ICD-10-CM | POA: Diagnosis not present

## 2022-04-05 DIAGNOSIS — K1233 Oral mucositis (ulcerative) due to radiation: Secondary | ICD-10-CM | POA: Diagnosis not present

## 2022-04-05 DIAGNOSIS — Z95828 Presence of other vascular implants and grafts: Secondary | ICD-10-CM

## 2022-04-05 LAB — CBC WITH DIFFERENTIAL/PLATELET
Abs Immature Granulocytes: 0.04 10*3/uL (ref 0.00–0.07)
Basophils Absolute: 0 10*3/uL (ref 0.0–0.1)
Basophils Relative: 0 %
Eosinophils Absolute: 0 10*3/uL (ref 0.0–0.5)
Eosinophils Relative: 0 %
HCT: 33.8 % — ABNORMAL LOW (ref 39.0–52.0)
Hemoglobin: 12 g/dL — ABNORMAL LOW (ref 13.0–17.0)
Immature Granulocytes: 1 %
Lymphocytes Relative: 10 %
Lymphs Abs: 0.8 10*3/uL (ref 0.7–4.0)
MCH: 34.7 pg — ABNORMAL HIGH (ref 26.0–34.0)
MCHC: 35.5 g/dL (ref 30.0–36.0)
MCV: 97.7 fL (ref 80.0–100.0)
Monocytes Absolute: 0.3 10*3/uL (ref 0.1–1.0)
Monocytes Relative: 3 %
Neutro Abs: 6.6 10*3/uL (ref 1.7–7.7)
Neutrophils Relative %: 86 %
Platelets: 270 10*3/uL (ref 150–400)
RBC: 3.46 MIL/uL — ABNORMAL LOW (ref 4.22–5.81)
RDW: 15.9 % — ABNORMAL HIGH (ref 11.5–15.5)
WBC: 7.7 10*3/uL (ref 4.0–10.5)
nRBC: 0 % (ref 0.0–0.2)

## 2022-04-05 LAB — COMPREHENSIVE METABOLIC PANEL
ALT: 16 U/L (ref 0–44)
AST: 19 U/L (ref 15–41)
Albumin: 3.9 g/dL (ref 3.5–5.0)
Alkaline Phosphatase: 46 U/L (ref 38–126)
Anion gap: 6 (ref 5–15)
BUN: 14 mg/dL (ref 6–20)
CO2: 28 mmol/L (ref 22–32)
Calcium: 8.5 mg/dL — ABNORMAL LOW (ref 8.9–10.3)
Chloride: 101 mmol/L (ref 98–111)
Creatinine, Ser: 0.93 mg/dL (ref 0.61–1.24)
GFR, Estimated: >60 mL/min (ref >60)
Glucose, Bld: 97 mg/dL (ref 70–99)
Potassium: 4 mmol/L (ref 3.5–5.1)
Sodium: 135 mmol/L (ref 135–145)
Total Bilirubin: 0.6 mg/dL (ref 0.3–1.2)
Total Protein: 6.7 g/dL (ref 6.5–8.1)

## 2022-04-05 LAB — MAGNESIUM: Magnesium: 1.9 mg/dL (ref 1.7–2.4)

## 2022-04-05 LAB — RAD ONC ARIA SESSION SUMMARY
Course Elapsed Days: 51
Plan Fractions Treated to Date: 29
Plan Prescribed Dose Per Fraction: 2 Gy
Plan Total Fractions Prescribed: 30
Plan Total Prescribed Dose: 60 Gy
Reference Point Dosage Given to Date: 58 Gy
Reference Point Session Dosage Given: 2 Gy
Session Number: 29

## 2022-04-05 MED ORDER — SODIUM CHLORIDE 0.9% FLUSH
10.0000 mL | INTRAVENOUS | Status: AC | PRN
  Administered 2022-04-05: 10 mL via INTRAVENOUS
  Filled 2022-04-05: qty 10

## 2022-04-05 MED ORDER — HEPARIN SOD (PORK) LOCK FLUSH 100 UNIT/ML IV SOLN
500.0000 [IU] | Freq: Once | INTRAVENOUS | Status: AC
  Administered 2022-04-05: 500 [IU] via INTRAVENOUS
  Filled 2022-04-05: qty 5

## 2022-04-05 MED FILL — Fosaprepitant Dimeglumine For IV Infusion 150 MG (Base Eq): INTRAVENOUS | Qty: 5 | Status: AC

## 2022-04-05 MED FILL — Dexamethasone Sodium Phosphate Inj 100 MG/10ML: INTRAMUSCULAR | Qty: 1 | Status: AC

## 2022-04-05 NOTE — Progress Notes (Signed)
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 Patient states that he presented with these complaints 6 months ago and had a CT soft tissue neck which did not show any malignancy.  He continued to have on and off pain which was gradually getting worse and he presented back to the ER on 09/27/2020.     CT soft tissue neck showed a left base of tongue mass measuring 3.4 x 2.6 x 3.3 cm which was invading the tongue along the anterior and medial margin and extending into the left vallecula.  Possible superior and posterior extension to involve the left palatine tonsil the lesion abuts the uvula with early extension into the soft palate.  Multiple left level 2 lymph nodes largest of which was 1.2 cm.  Additional necrotic limited to level 3 nodal conglomerate measures 1.3 cm.  No right-sided adenopathy.   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  PET CT scan Showed left base of tongue mass measuring about 2.8 cm with enlarged centrally necrotic level 2/3 left-sided and lymph nodes with an SUV about 3.7.  No hypermetabolic right neck nodes.  No findings of distant metastatic disease   Patient completed 5 cycles of weekly cisplatin chemotherapy between January to March 2022.  There were treatment interruptions due  to mucositis.  He could not complete 7 cycles eventually.  He did complete his radiation treatment.   Towards the end of radiation patient was admitted for suicidal ideation.  At the end of chemoradiation patient did not have any evidence of residual disease.  However he was found to have biopsy-proven local recurrence in March 2023.  Plan is for retreatment with cisplatin and radiation     Interval history-he still has pain at the site of his recurrence but overall mucositis has been better.  He has been diligent about drinking his fluids and trying to maintain his p.o. intake.  ECOG PS- 1 Pain scale- 4 Opioid associated constipation- no  Review of systems- Review of Systems  Constitutional:  Negative for chills, fever, malaise/fatigue and weight loss.  HENT:  Negative for congestion, ear discharge and nosebleeds.        Throat pain  Eyes:  Negative for blurred vision.  Respiratory:  Negative for cough, hemoptysis, sputum production, shortness of breath and wheezing.   Cardiovascular:  Negative for chest pain, palpitations, orthopnea and claudication.  Gastrointestinal:  Negative for abdominal pain, blood in stool, constipation, diarrhea, heartburn, melena, nausea and vomiting.  Genitourinary:  Negative for dysuria, flank pain, frequency, hematuria and urgency.  Musculoskeletal:  Negative for back pain, joint pain and myalgias.  Skin:  Negative for rash.  Neurological:  Negative for dizziness, tingling, focal weakness, seizures, weakness and headaches.  Endo/Heme/Allergies:  Does not bruise/bleed easily.  Psychiatric/Behavioral:  Negative for depression and suicidal ideas. The patient does not have insomnia.       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[facility])      Past Surgical History:  Procedure Laterality Date   LARYNGOSCOPY Left 01/24/2022   Procedure: DIRECT LARYNGOSCOPY WITH TONSIL BIOPSY;   Surgeon: Beverly Gust, MD;  Location: [facility] ORS;  Service: ENT;  Laterality: Left;   PORT-A-CATH REMOVAL N/A 02/04/2021   Procedure: REMOVAL PORT-A-CATH;  Surgeon: Olean Ree, MD;  Location: [facility] ORS;  Service: General;  Laterality: N/A;   PORTA CATH INSERTION N/A 10/25/2020   Procedure: PORTA CATH INSERTION;  Surgeon: Algernon Huxley, MD;  Location: [place] [facility];  Service: Cardiovascular;  Laterality: N/A;   PORTA CATH INSERTION N/A 02/09/2022   Procedure: PORTA CATH INSERTION;  Surgeon: Algernon Huxley, MD;  Location: [place] [facility];  Service: Cardiovascular;  Laterality: N/A;   REMOVAL OF GASTROSTOMY TUBE N/A 02/04/2021   Procedure: REMOVAL OF GASTROSTOMY TUBE;  Surgeon: Olean Ree, MD;  Location: [facility] ORS;  Service: General;  Laterality: N/A;   TONGUE BIOPSY Left 01/24/2022   Procedure: TONGUE BIOPSY;  Surgeon: Beverly Gust, MD;  Location: [facility] ORS;  Service: ENT;  Laterality: Left;    Social History   Socioeconomic History   Marital status: Single    Spouse name: Not on file   Number of children: Not on file   Years of education: Not on file   Highest education level: Not on file  Occupational History   Not on file  Tobacco Use   Smoking status: Every Day    Packs/day: 0.75    Years: 35.00    Total pack years: 26.25    Types: Cigarettes   Smokeless tobacco: Current   Tobacco comments:    Patches very rarely  Vaping Use   Vaping Use: Former  Substance and Sexual Activity   Alcohol use: Yes    Comment: occ- 3 times a week   Drug use: Not Currently    Types: Marijuana, "Crack" cocaine, Methamphetamines    Comment: marijuana every day except the joint   Sexual activity: Not Currently  Other Topics Concern   Not on file  Social History Narrative   Not on file   Social Determinants of Health   Financial Resource Strain: Not on file  Food Insecurity: Not on file  Transportation Needs: Not on file  Physical Activity: Not on file  Stress: Not on file   Social Connections: Not on file  Intimate Partner Violence: Not on file    Family History  Problem Relation Age of Onset   Cancer Father      Current Outpatient Medications:    calcium carbonate (TUMS - DOSED IN MG ELEMENTAL CALCIUM) 500 MG chewable tablet, Chew 1 tablet by mouth as needed for indigestion or heartburn., Disp: , Rfl:    dexamethasone (DECADRON) 4 MG tablet, Take 1 tablet (4 mg total) by mouth 2 (two) times daily with a meal., Disp: 25 tablet, Rfl: 0   DULoxetine (CYMBALTA) 30 MG capsule, Take 1 capsule (30 mg total) by mouth daily., Disp: 30 capsule, Rfl: 1   fluconazole (DIFLUCAN) 100 MG tablet, Take 1 tablet (100 mg total) by mouth daily., Disp: 14 tablet, Rfl: 0   gabapentin (NEURONTIN) 300 MG capsule, Take 1 capsule (300 mg total) by mouth 3 (three) times daily., Disp: 90 capsule, Rfl: 1   lansoprazole (PREVACID) 30 MG capsule, Take 1 capsule (30 mg total) by mouth daily at 12 noon., Disp:  30 capsule, Rfl: 3   lidocaine (XYLOCAINE) 2 % solution, Use as directed 15 mLs in the mouth or throat every 6 (six) hours as needed for mouth pain., Disp: 100 mL, Rfl: 1   LORazepam (ATIVAN) 0.5 MG tablet, Take 1 tablet (0.5 mg total) by mouth daily. Once a day prior to each radiation treatment, Disp: 30 tablet, Rfl: 0   naproxen (NAPROSYN) 500 MG tablet, Take 1 tablet (500 mg total) by mouth 2 (two) times daily with a meal., Disp: 20 tablet, Rfl: 0   Oxycodone HCl 10 MG TABS, Take 1 tablet (10 mg total) by mouth every 4 (four) hours as needed., Disp: 120 tablet, Rfl: 0   prochlorperazine (COMPAZINE) 10 MG tablet, Take 1 tablet (10 mg total) by mouth every 6 (six) hours as needed (Nausea or vomiting)., Disp: 30 tablet, Rfl: 1   sucralfate (CARAFATE) 1 g tablet, Take 1 tablet (1 g total) by mouth 3 (three) times daily before meals., Disp: 90 tablet, Rfl: 6   lidocaine-prilocaine (EMLA) cream, Apply to affected area once (Patient not taking: Reported on 04/05/2022), Disp: 30 g, Rfl: 3    ondansetron (ZOFRAN) 8 MG tablet, Take 1 tablet (8 mg total) by mouth 2 (two) times daily as needed. Start on the third day after cisplatin chemotherapy. (Patient not taking: Reported on 02/13/2022), Disp: 30 tablet, Rfl: 1 No current facility-administered medications for this visit.  Facility-Administered Medications Ordered in Other Visits:    heparin lock flush 100 UNIT/ML injection, , , ,    heparin lock flush 100 UNIT/ML injection, , , ,    sodium chloride flush (NS) 0.9 % injection 10 mL, 10 mL, Intravenous, PRN, Sindy Guadeloupe, MD, 10 mL at 04/05/22 1112  Physical exam:  Vitals:   04/05/22 1135  BP: 118/68  Pulse: 71  Resp: 16  Temp: 98.3 F (36.8 C)  TempSrc: Tympanic  Weight: 145 lb 4.8 oz (65.9 kg)  Height: 5' 8.5" (1.74 m)   Physical Exam Constitutional:      General: He is not in acute distress. HENT:     Mouth/Throat:     Mouth: Mucous membranes are moist.     Pharynx: Oropharynx is clear.  Cardiovascular:     Rate and Rhythm: Normal rate and regular rhythm.     Heart sounds: Normal heart sounds.  Pulmonary:     Effort: Pulmonary effort is normal.     Breath sounds: Normal breath sounds.  Abdominal:     General: Bowel sounds are normal.     Palpations: Abdomen is soft.  Skin:    General: Skin is warm and dry.  Neurological:     Mental Status: He is alert and oriented to person, place, and time.         Latest Ref Rng & Units 04/05/2022   11:00 AM  CMP  Glucose 70 - 99 mg/dL 97   BUN 6 - 20 mg/dL 14   Creatinine 0.61 - 1.24 mg/dL 0.93   Sodium 135 - 145 mmol/L 135   Potassium 3.5 - 5.1 mmol/L 4.0   Chloride 98 - 111 mmol/L 101   CO2 22 - 32 mmol/L 28   Calcium 8.9 - 10.3 mg/dL 8.5   Total Protein 6.5 - 8.1 g/dL 6.7   Total Bilirubin 0.3 - 1.2 mg/dL 0.6   Alkaline Phos 38 - 126 U/L 46   AST 15 - 41 U/L 19   ALT 0 - 44 U/L 16  Latest Ref Rng & Units 04/05/2022   11:00 AM  CBC  WBC 4.0 - 10.5 K/uL 7.7   Hemoglobin 13.0 - 17.0 g/dL 12.0    Hematocrit 39.0 - 52.0 % 33.8   Platelets 150 - 400 K/uL 270      Assessment and plan- Patient is a 50 y.o. 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  He is s/p 5 cycles of weekly cisplatin chemotherapy with concurrent radiation treatment.  Chemo was stopped prematurely due to worsening mucositis and patient completed radiation treatment in April 2022.  He now has noted blood and biopsy-proven locally recurrent disease.  He is here for on treatment assessment prior to cycle 6 of weekly cisplatin chemotherapy  Patient did not get chemotherapy last week due to worsening mucositis.  Today his mucositis looks much better and he will proceed with cycle 6 of weekly cisplatin chemotherapy tomorrow.  This will be his last cycle.  Patient will continue to stay on Cymbalta gabapentin as well as as needed oxycodone for his ongoing pain.  I hope to get him off oxycodone in the next 2 months.  He will see NP Altha Harm in 2 weeks time and I will see him back in 10 weeks time with port labs CBC with differential and CMP.  We will plan to get PET scan about 12 weeks from now.  Based on his assessment in 2 weeks we will decide if the patient needs to be seen again between 2 and 12 weeks.   Visit Diagnosis 1. Squamous cell carcinoma of oropharynx ([place])   2. Mucositis due to radiation therapy   3. Encounter for antineoplastic chemotherapy      Dr. Randa Evens, MD, MPH [facility] [phone] 04/05/2022 2:43 PM

## 2022-04-06 ENCOUNTER — Other Ambulatory Visit: Payer: Self-pay

## 2022-04-06 ENCOUNTER — Inpatient Hospital Stay: Payer: Medicaid Other

## 2022-04-06 ENCOUNTER — Ambulatory Visit
Admission: RE | Admit: 2022-04-06 | Discharge: 2022-04-06 | Disposition: A | Discharge status: Home / Self Care | Payer: Medicaid Other | Source: Ambulatory Visit | Attending: Radiation Oncology | Admitting: Radiation Oncology

## 2022-04-06 VITALS — BP 131/89 | HR 100 | Temp 98.5°F | Resp 20 | Wt 145.0 lb

## 2022-04-06 DIAGNOSIS — Z5111 Encounter for antineoplastic chemotherapy: Secondary | ICD-10-CM | POA: Diagnosis not present

## 2022-04-06 DIAGNOSIS — C109 Malignant neoplasm of oropharynx, unspecified: Secondary | ICD-10-CM

## 2022-04-06 LAB — RAD ONC ARIA SESSION SUMMARY
Course Elapsed Days: 52
Plan Fractions Treated to Date: 30
Plan Prescribed Dose Per Fraction: 2 Gy
Plan Total Fractions Prescribed: 30
Plan Total Prescribed Dose: 60 Gy
Reference Point Dosage Given to Date: 60 Gy
Reference Point Session Dosage Given: 2 Gy
Session Number: 30

## 2022-04-06 MED ORDER — SODIUM CHLORIDE 0.9 % IV SOLN
150.0000 mg | Freq: Once | INTRAVENOUS | Status: AC
  Administered 2022-04-06: 150 mg via INTRAVENOUS
  Filled 2022-04-06: qty 150

## 2022-04-06 MED ORDER — MAGNESIUM SULFATE 2 GM/50ML IV SOLN
2.0000 g | Freq: Once | INTRAVENOUS | Status: AC
  Administered 2022-04-06: 2 g via INTRAVENOUS
  Filled 2022-04-06: qty 50

## 2022-04-06 MED ORDER — SODIUM CHLORIDE 0.9% FLUSH
10.0000 mL | INTRAVENOUS | Status: DC | PRN
  Administered 2022-04-06: 10 mL
  Filled 2022-04-06: qty 10

## 2022-04-06 MED ORDER — SODIUM CHLORIDE 0.9 % IV SOLN
40.0000 mg/m2 | Freq: Once | INTRAVENOUS | Status: AC
  Administered 2022-04-06: 73 mg via INTRAVENOUS
  Filled 2022-04-06: qty 73

## 2022-04-06 MED ORDER — SODIUM CHLORIDE 0.9 % IV SOLN
Freq: Once | INTRAVENOUS | Status: AC
  Filled 2022-04-06: qty 250

## 2022-04-06 MED ORDER — PALONOSETRON HCL INJECTION 0.25 MG/5ML
0.2500 mg | Freq: Once | INTRAVENOUS | Status: AC
  Administered 2022-04-06: 0.25 mg via INTRAVENOUS
  Filled 2022-04-06: qty 5

## 2022-04-06 MED ORDER — POTASSIUM CHLORIDE IN NACL 20-0.9 MEQ/L-% IV SOLN
Freq: Once | INTRAVENOUS | Status: AC
  Filled 2022-04-06: qty 1000

## 2022-04-06 MED ORDER — SODIUM CHLORIDE 0.9 % IV SOLN
10.0000 mg | Freq: Once | INTRAVENOUS | Status: AC
  Administered 2022-04-06: 10 mg via INTRAVENOUS
  Filled 2022-04-06: qty 10

## 2022-04-06 MED ORDER — HEPARIN SOD (PORK) LOCK FLUSH 100 UNIT/ML IV SOLN
500.0000 [IU] | Freq: Once | INTRAVENOUS | Status: AC | PRN
  Administered 2022-04-06: 500 [IU]
  Filled 2022-04-06: qty 5

## 2022-04-06 NOTE — Patient Instructions (Signed)
[facility] [place]  Discharge Instructions: Thank you for choosing [facility] to provide your oncology and hematology care.  If you have a lab appointment with the Cancer Center, please go directly to the Cancer Center and check in at the registration area.  Wear comfortable clothing and clothing appropriate for easy access to any Portacath or PICC line.   We strive to give you quality time with your provider. You may need to reschedule your appointment if you arrive late (15 or more minutes).  Arriving late affects you and other patients whose appointments are after yours.  Also, if you miss three or more appointments without notifying the office, you may be dismissed from the clinic at the provider's discretion.      For prescription refill requests, have your pharmacy contact our office and allow 72 hours for refills to be completed.    Today you received the following chemotherapy and/or immunotherapy agents Cisplatin    To help prevent nausea and vomiting after your treatment, we encourage you to take your nausea medication as directed.  BELOW ARE SYMPTOMS THAT SHOULD BE REPORTED IMMEDIATELY: *FEVER GREATER THAN 100.4 F (38 C) OR HIGHER *CHILLS OR SWEATING *NAUSEA AND VOMITING THAT IS NOT CONTROLLED WITH YOUR NAUSEA MEDICATION *UNUSUAL SHORTNESS OF BREATH *UNUSUAL BRUISING OR BLEEDING *URINARY PROBLEMS (pain or burning when urinating, or frequent urination) *BOWEL PROBLEMS (unusual diarrhea, constipation, pain near the anus) TENDERNESS IN MOUTH AND THROAT WITH OR WITHOUT PRESENCE OF ULCERS (sore throat, sores in mouth, or a toothache) UNUSUAL RASH, SWELLING OR PAIN  UNUSUAL VAGINAL DISCHARGE OR ITCHING   Items with * indicate a potential emergency and should be followed up as soon as possible or go to the Emergency Department if any problems should occur.  Please show the CHEMOTHERAPY ALERT CARD or IMMUNOTHERAPY ALERT CARD at check-in to the  Emergency Department and triage nurse.  Should you have questions after your visit or need to cancel or reschedule your appointment, please contact [facility]  [phone] and follow the prompts.  Office hours are 8:00 a.m. to 4:30 p.m. Monday - Friday. Please note that voicemails left after 4:00 p.m. may not be returned until the following business day.  We are closed weekends and major holidays. You have access to a nurse at all times for urgent questions. Please call the main number to the clinic [phone] and follow the prompts.  For any non-urgent questions, you may also contact your provider using MyChart. We now offer e-Visits for anyone 18 and older to request care online for non-urgent symptoms. For details visit [email or web address].   Also download the MyChart app! Go to the app store, search "MyChart", open the app, select [facility], and log in with your MyChart username and password.  Masks are optional in the cancer centers. If you would like for your care team to wear a mask while they are taking care of you, please let them know. For doctor visits, patients may have with them one support person who is at least 52 years old. At this time, visitors are not allowed in the infusion area.   

## 2022-04-10 ENCOUNTER — Other Ambulatory Visit: Payer: Self-pay | Admitting: *Deleted

## 2022-04-10 MED ORDER — MAGIC MOUTHWASH W/LIDOCAINE: 5.0000 mL | Freq: Four times a day (QID) | ORAL | 0 refills | Status: DC

## 2022-04-12 ENCOUNTER — Emergency Department
Admission: EM | Admit: 2022-04-12 | Discharge: 2022-04-12 | Disposition: A | Discharge status: Home / Self Care | Payer: Medicaid Other | Attending: Emergency Medicine | Admitting: Emergency Medicine

## 2022-04-12 ENCOUNTER — Other Ambulatory Visit: Payer: Self-pay

## 2022-04-12 DIAGNOSIS — Z8501 Personal history of malignant neoplasm of esophagus: Secondary | ICD-10-CM | POA: Diagnosis not present

## 2022-04-12 DIAGNOSIS — J029 Acute pharyngitis, unspecified: Secondary | ICD-10-CM | POA: Diagnosis not present

## 2022-04-12 DIAGNOSIS — S60512A Abrasion of left hand, initial encounter: Secondary | ICD-10-CM | POA: Insufficient documentation

## 2022-04-12 DIAGNOSIS — W010XXA Fall on same level from slipping, tripping and stumbling without subsequent striking against object, initial encounter: Secondary | ICD-10-CM | POA: Diagnosis not present

## 2022-04-12 DIAGNOSIS — J312 Chronic pharyngitis: Secondary | ICD-10-CM

## 2022-04-12 DIAGNOSIS — S6992XA Unspecified injury of left wrist, hand and finger(s), initial encounter: Secondary | ICD-10-CM | POA: Diagnosis present

## 2022-04-12 MED ORDER — OXYCODONE-ACETAMINOPHEN 5-325 MG PO TABS
2.0000 | ORAL_TABLET | Freq: Once | ORAL | Status: AC
  Administered 2022-04-12: 2 via ORAL
  Filled 2022-04-12: qty 2

## 2022-04-12 MED ORDER — PANTOPRAZOLE SODIUM 40 MG PO TBEC
40.0000 mg | DELAYED_RELEASE_TABLET | Freq: Every day | ORAL | Status: DC
  Administered 2022-04-12: 40 mg via ORAL
  Filled 2022-04-12: qty 1

## 2022-04-12 MED ORDER — ONDANSETRON 8 MG PO TBDP
8.0000 mg | ORAL_TABLET | Freq: Once | ORAL | Status: AC
  Administered 2022-04-12: 8 mg via ORAL
  Filled 2022-04-12: qty 1

## 2022-04-12 MED ORDER — BACITRACIN ZINC 500 UNIT/GM EX OINT
TOPICAL_OINTMENT | Freq: Once | CUTANEOUS | Status: AC
  Filled 2022-04-12: qty 0.9

## 2022-04-12 MED ORDER — LIDOCAINE VISCOUS HCL 2 % MT SOLN
15.0000 mL | Freq: Once | OROMUCOSAL | Status: AC
  Administered 2022-04-12: 15 mL via OROMUCOSAL
  Filled 2022-04-12: qty 15

## 2022-04-12 MED ORDER — SUCRALFATE 1 G PO TABS
1.0000 g | ORAL_TABLET | ORAL | Status: AC
  Administered 2022-04-12: 1 g via ORAL
  Filled 2022-04-12: qty 1

## 2022-04-12 NOTE — ED Notes (Signed)
Pt has small abrasion to left palm at thumb. Bleeding controlled. Pt also states his nose has been bleeding. Pt blowing nose after being told not to causing a scant amount of blood to come out. Pt also asking for his cancer doctor due to pain in his throat.

## 2022-04-12 NOTE — ED Provider Notes (Signed)
[facility] Provider Note    Event Date/Time   First MD Initiated Contact with Patient 04/12/22 1132     (approximate)   History   Medical Clearance and Laceration   HPI  Paul Hebert is a 51 y.o. male presents to the emergency department and please custody for evaluation of injury to the left hand and clearance for jail.  Patient states that he was trying to evade the police officer and tripped and fell.  He denies striking his head or experiencing any loss of consciousness.  Patient states that he has not had any of his pain medication since yesterday because his significant other stole them.  He is complaining of pain in his throat secondary to esophageal cancer for which he has recently completed treatment.  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)      Physical Exam   Triage Vital Signs: ED Triage Vitals  Enc Vitals Group     BP 04/12/22 1129 (!) 139/94     Pulse Rate 04/12/22 1129 (!) 103     Resp 04/12/22 1129 17     Temp 04/12/22 1129 98.1 F (36.7 C)     Temp Source 04/12/22 1129 Oral     SpO2 04/12/22 1129 98 %     Weight 04/12/22 1122 144 lb 13.5 oz (65.7 kg)     Height 04/12/22 1122 5' 8.5" (1.74 m)     Head Circumference --      Peak Flow --      Pain Score 04/12/22 1129 9     Pain Loc --      Pain Edu? --      Excl. in [place]? --     Most recent vital signs: Vitals:   04/12/22 1129  BP: (!) 139/94  Pulse: (!) 103  Resp: 17  Temp: 98.1 F (36.7 C)  SpO2: 98%    General: Awake, no distress.  CV:  Good peripheral perfusion.  Resp:  Normal effort.  Abd:  No distention.  Other:  Abrasion noted to the dorsal aspect of the left hand.  No active bleeding.   ED Results / Procedures / Treatments   Labs (all labs ordered are listed, but only abnormal results are displayed) Labs Reviewed - No data to display   EKG  Not indicated   RADIOLOGY  Not  indicated  I have independently reviewed and interpreted imaging as well as reviewed report from radiology.  PROCEDURES:  Critical Care performed: No  Procedures   MEDICATIONS ORDERED IN ED:  Medications  pantoprazole (PROTONIX) EC tablet 40 mg (40 mg Oral Given 04/12/22 1215)  oxyCODONE-acetaminophen (PERCOCET/ROXICET) 5-325 MG per tablet 2 tablet (2 tablets Oral Given 04/12/22 1143)  lidocaine (XYLOCAINE) 2 % viscous mouth solution 15 mL (15 mLs Mouth/Throat Given 04/12/22 1143)  sucralfate (CARAFATE) tablet 1 g (1 g Oral Given 04/12/22 1214)  ondansetron (ZOFRAN-ODT) disintegrating tablet 8 mg (8 mg Oral Given 04/12/22 1216)  bacitracin ointment ( Topical Given 04/12/22 1218)     IMPRESSION / MDM / ASSESSMENT AND PLAN / ED COURSE   I reviewed the triage vital signs and the nursing notes.  Differential diagnosis includes, but is not limited to: Laceration, abrasion, contusion; opioid withdrawal, chronic pain  Patient's presentation is most consistent with acute, uncomplicated illness.  51 year old male presenting to the emergency department for treatment and evaluation of injury to the left hand while attempting  to evade the police.  See HPI for further details.  He is also complaining of acute on chronic pain due to not having had his oxycodone since yesterday.   Injury to the left hand is an abrasion that is not actively bleeding.  Antibiotic ointment and bandage applied by RN.  He will be provided with a single dose of oxycodone.  He is also requesting his antinausea medicines and Magic mouthwash for his throat pain.  Please also are requesting a copy of his current medication so that he may receive them while he is incarcerated.  Patient gave consent to have them printed and given to the officer.  Pharmacy staff has verified current medications. These are included in his discharge papers.      FINAL CLINICAL IMPRESSION(S) / ED DIAGNOSES   Final diagnoses:  Abrasion, hand,  left, initial encounter  Sore throat, chronic     Rx / DC Orders   ED Discharge Orders     None        Note:  This document was prepared using Dragon voice recognition software and may include unintentional dictation errors.   Victorino Dike, FNP 04/12/22 1712    Duffy Bruce, MD 04/15/22 1114

## 2022-04-12 NOTE — Discharge Instructions (Signed)
Monitor hand abrasion for worsening redness, drainage, or increased pain.  Follow up with your cancer doctor or primary care provider for concerns and as previously scheduled.

## 2022-04-12 NOTE — ED Triage Notes (Signed)
Pt is here in police custody in hand cuffs for medical clearance to go to jail, pt has a lac to the left hand with controlled bleeding.,

## 2022-04-13 ENCOUNTER — Encounter: Payer: Self-pay | Admitting: Licensed Clinical Social Worker

## 2022-04-13 NOTE — Progress Notes (Addendum)
[place] Work  Clinical Social Work was referred by medical provider for assessment of psychosocial needs.  Clinical Social Worker attempted to contact patient by phone  to offer support and assess for needs. Patient's telephone is not in working.   Patient came to [facility] ED in custody with the [facility]. He is at the [facility] [place] spoke with medical staff and I was told he will not be released before his next appointment with Palliative Care NP on 04/20/2022.  I was given the contact info for [place], [phone], for updates.  CSW updated Oncologist, Palliative Care NP and [employer].  2nd attempt  Adelene Amas, [place]        Patient is participating in a Managed Medicaid Plan:  Yes

## 2022-04-19 ENCOUNTER — Telehealth: Payer: Self-pay

## 2022-04-19 NOTE — Telephone Encounter (Signed)
I'm not sure who is covering scheduling for Dr Janese Banks now but this patient needs all of his appointments rescheduled. He is incarcerated. Please call Manuela Schwartz at [phone]. She also asked that his personal numbers and alternate contacts all be taken out and switched to that number until he is released. She also asked that his address be changed to the jail address until he is released as well. [address] [place] [address].

## 2022-04-20 ENCOUNTER — Inpatient Hospital Stay: Payer: Medicaid Other | Admitting: Hospice and Palliative Medicine

## 2022-04-24 ENCOUNTER — Encounter: Payer: Self-pay | Admitting: Hospice and Palliative Medicine

## 2022-04-24 ENCOUNTER — Other Ambulatory Visit: Payer: Self-pay

## 2022-04-24 ENCOUNTER — Encounter: Payer: Self-pay | Admitting: *Deleted

## 2022-04-24 ENCOUNTER — Inpatient Hospital Stay: Payer: Medicaid Other | Attending: Oncology | Admitting: Hospice and Palliative Medicine

## 2022-04-24 VITALS — BP 141/106 | HR 88 | Temp 98.6°F | Resp 20 | Ht 68.5 in | Wt 133.5 lb

## 2022-04-24 DIAGNOSIS — Z79899 Other long term (current) drug therapy: Secondary | ICD-10-CM | POA: Insufficient documentation

## 2022-04-24 DIAGNOSIS — Z9221 Personal history of antineoplastic chemotherapy: Secondary | ICD-10-CM | POA: Diagnosis not present

## 2022-04-24 DIAGNOSIS — Z923 Personal history of irradiation: Secondary | ICD-10-CM | POA: Insufficient documentation

## 2022-04-24 DIAGNOSIS — C109 Malignant neoplasm of oropharynx, unspecified: Secondary | ICD-10-CM | POA: Diagnosis not present

## 2022-04-24 DIAGNOSIS — C01 Malignant neoplasm of base of tongue: Secondary | ICD-10-CM | POA: Diagnosis present

## 2022-04-24 NOTE — Progress Notes (Signed)
Patient consented to give AVS/apts to his prison guard. Pt d/c in wheelchair and also given letter to take back to his prison nurse.

## 2022-04-24 NOTE — Progress Notes (Addendum)
Symptom Management [place] at [facility] [phone] Fax:([phone]  Patient Care Team: Pcp, No as PCP - General Noreene Filbert, MD as Referring Physician (Radiation Oncology) Sindy Guadeloupe, MD as Consulting Physician (Oncology) Jules Husbands, MD as Consulting Physician (General Surgery) Gloris Ham, RN as Registered Nurse (Oncology)   Name of the patient: Yosgart Pavey  [id number]  07-May-1971   Date of visit: 04/24/22  Reason for Consult: DAEKWON BESWICK is a 51 y.o. 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  Patient had local recurrence and is again and is status post concurrent chemoradiation.  Patient has completed 6 cycles of cisplatin and radiation.  Since he was last seen, patient is now incarcerated for 9 drug-related offense.  Reportedly, incarceration is related to breaking and entering.   Patient was accompanied by an Garment/textile technologist from the jail.  Patient endorses weight loss and does not feel like he has access to enough food in jail.  He also endorses feeling cold all the time but denies fever or chills.  He has occasional nausea but no vomiting.  He says pain is reasonably well controlled.  Denies any neurologic complaints. Denies recent fevers or illnesses. Denies any easy bleeding or bruising. Denies urinary complaints. Patient offers no further specific complaints today.  PAST MEDICAL HISTORY: Past Medical History:  Diagnosis Date   GERD (gastroesophageal reflux disease)    Tonsil cancer ([place])     PAST SURGICAL HISTORY:  Past Surgical History:  Procedure Laterality Date   LARYNGOSCOPY Left 01/24/2022   Procedure: DIRECT LARYNGOSCOPY WITH TONSIL BIOPSY;  Surgeon: Beverly Gust, MD;  Location: [facility]S;  Service: ENT;   Laterality: Left;   PORT-A-CATH REMOVAL N/A 02/04/2021   Procedure: REMOVAL PORT-A-CATH;  Surgeon: Olean Ree, MD;  Location: [facility] OR[facility];  Service: General;  Laterality: N/A;   PORTA CATH INSERTION N/A 10/25/2020   Procedure: PORTA CATH INSERTION;  Surgeon: Algernon Huxley, MD;  Location: [place] [facility];  Service: Cardiovascular;  Laterality: N/A;   PORTA CATH INSERTION N/A 02/09/2022   Procedure: PORTA CATH INSERTION;  Surgeon: Algernon Huxley, MD;  Location: [place] [facility];  Service: Cardiovascular;  Laterality: N/A;   REMOVAL OF GASTROSTOMY TUBE N/A 02/04/2021   Procedure: REMOVAL OF GASTROSTOMY TUBE;  Surgeon: Olean Ree, MD;  Location: [facility];  Service: General;  Laterality: N/A;   TONGUE BIOPSY Left 01/24/2022   Procedure: TONGUE BIOPSY;  Surgeon: Beverly Gust, MD;  Location: [facility] OR[facility];  Service: ENT;  Laterality: Left;    HEMATOLOGY/ONCOLOGY HISTORY:  Oncology History  Squamous cell carcinoma of oropharynx ([place])  10/18/2020 Initial Diagnosis   Squamous cell carcinoma of oropharynx ([place])   10/18/2020 Cancer Staging   Staging form: Pharynx - HPV-Mediated Oropharynx, AJCC 8th Edition - Clinical stage from 10/18/2020: Stage I (cT2, cN1, cM0) - Signed by Sindy Guadeloupe, MD on 10/18/2020   11/01/2020 - 12/20/2020 Chemotherapy   Patient is on Treatment Plan : HEAD/NECK Cisplatin q7d     02/13/2022 -  Chemotherapy   Patient is on Treatment Plan : HEAD/NECK Cisplatin q7d       ALLERGIES:  has No Known Allergies.  MEDICATIONS:  Current Outpatient Medications  Medication Sig Dispense Refill   calcium carbonate (TUMS - DOSED IN MG ELEMENTAL CALCIUM) 500 MG chewable tablet Chew 1  tablet by mouth as needed for indigestion or heartburn.     dexamethasone (DECADRON) 4 MG tablet Take 1 tablet (4 mg total) by mouth 2 (two) times daily with a meal. 25 tablet 0   DULoxetine (CYMBALTA) 30 MG capsule Take 1 capsule (30 mg total) by mouth daily. 30 capsule 1   fluconazole (DIFLUCAN) 100 MG tablet  Take 1 tablet (100 mg total) by mouth daily. 14 tablet 0   gabapentin (NEURONTIN) 300 MG capsule Take 1 capsule (300 mg total) by mouth 3 (three) times daily. 90 capsule 1   hydrocortisone (CORTEF) 20 MG tablet Take by mouth.     lansoprazole (PREVACID) 30 MG capsule Take 1 capsule (30 mg total) by mouth daily at 12 noon. 30 capsule 3   lidocaine (XYLOCAINE) 2 % solution Use as directed 15 mLs in the mouth or throat every 6 (six) hours as needed for mouth pain. 100 mL 1   lidocaine-prilocaine (EMLA) cream Apply to affected area once (Patient not taking: Reported on 04/05/2022) 30 g 3   LORazepam (ATIVAN) 0.5 MG tablet Take 1 tablet (0.5 mg total) by mouth daily. Once a day prior to each radiation treatment 30 tablet 0   magic mouthwash w/lidocaine SOLN Take 5 mLs by mouth 4 (four) times daily. 210 mL 0   magic mouthwash w/lidocaine SOLN Take 5 mLs by mouth 3 (three) times daily.     naproxen (NAPROSYN) 500 MG tablet Take 1 tablet (500 mg total) by mouth 2 (two) times daily with a meal. 20 tablet 0   ondansetron (ZOFRAN) 8 MG tablet Take 1 tablet (8 mg total) by mouth 2 (two) times daily as needed. Start on the third day after cisplatin chemotherapy. (Patient not taking: Reported on 02/13/2022) 30 tablet 1   Oxycodone HCl 10 MG TABS Take 1 tablet (10 mg total) by mouth every 4 (four) hours as needed. 120 tablet 0   prochlorperazine (COMPAZINE) 10 MG tablet Take 1 tablet (10 mg total) by mouth every 6 (six) hours as needed (Nausea or vomiting). 30 tablet 1   sucralfate (CARAFATE) 1 g tablet Take 1 tablet (1 g total) by mouth 3 (three) times daily before meals. 90 tablet 6   No current facility-administered medications for this visit.   Facility-Administered Medications Ordered in Other Visits  Medication Dose Route Frequency Provider Last Rate Last Admin   heparin lock flush 100 UNIT/ML injection            heparin lock flush 100 UNIT/ML injection            sodium chloride flush (NS) 0.9 % injection  10 mL  10 mL Intravenous PRN Sindy Guadeloupe, MD   10 mL at 04/05/22 1112    VITAL SIGNS: BP (!) 141/106   Pulse 88   Temp 98.6 F (37 C) (Tympanic)   Resp 20   Ht 5' 8.5" (1.74 m)   Wt 133 lb 8 oz (60.6 kg)   SpO2 100%   BMI 20.00 kg/m  Filed Weights   04/24/22 1031  Weight: 133 lb 8 oz (60.6 kg)    Estimated body mass index is 20 kg/m as calculated from the following:   Height as of this encounter: 5' 8.5" (1.74 m).   Weight as of this encounter: 133 lb 8 oz (60.6 kg).  LABS: CBC:    Component Value Date/Time   WBC 7.7 04/05/2022 1100   HGB 12.0 (L) 04/05/2022 1100   HCT 33.8 (L) 04/05/2022 1100  PLT 270 04/05/2022 1100   MCV 97.7 04/05/2022 1100   NEUTROABS 6.6 04/05/2022 1100   LYMPHSABS 0.8 04/05/2022 1100   MONOABS 0.3 04/05/2022 1100   EOSABS 0.0 04/05/2022 1100   BASOSABS 0.0 04/05/2022 1100   Comprehensive Metabolic Panel:    Component Value Date/Time   NA 135 04/05/2022 1100   K 4.0 04/05/2022 1100   CL 101 04/05/2022 1100   CO2 28 04/05/2022 1100   BUN 14 04/05/2022 1100   CREATININE 0.93 04/05/2022 1100   GLUCOSE 97 04/05/2022 1100   CALCIUM 8.5 (L) 04/05/2022 1100   AST 19 04/05/2022 1100   ALT 16 04/05/2022 1100   ALKPHOS 46 04/05/2022 1100   BILITOT 0.6 04/05/2022 1100   PROT 6.7 04/05/2022 1100   ALBUMIN 3.9 04/05/2022 1100    RADIOGRAPHIC STUDIES: No results found.  PERFORMANCE STATUS (ECOG) : 1 - Symptomatic but completely ambulatory  Review of Systems Unless otherwise noted, a complete review of systems is negative.  Physical Exam General: NAD HEENT: No oral lesions, no lymphadenopathy Cardiovascular: regular rate and rhythm Pulmonary: clear ant fields Abdomen: soft, nontender, + bowel sounds GU: no suprapubic tenderness Extremities: no edema, no joint deformities Skin: no rashes Neurological: Nonfocal  Assessment and Plan- Patient is a 51 y.o. male GUSTIN ZOBRIST is a 51 y.o. 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  Patient had local recurrence and completed 6 cycles of cisplatin and radiation.   Patient appears to be doing well symptomatically.  No mucositis on exam.  However, weight is down approximately 11 pounds since he was last seen on 04/12/2022.  I did write a letter at patient's request asking jail to provide high-calorie foods and oral nutritional supplements such as Boost or Ensure.  I did call to speak with the jail RN, Joellen Jersey, and confirmed that his current medications are duloxetine, dexamethasone, sucralfate, omeprazole, and as needed Zofran.  Patient states that his sucralfate is not being administered dissolved in water.  I did include on the letter that this would be encouraged due to history of radiation esophagitis.  Patient suspects that he will be incarcerated for at least 6 to 12 months.  Case and plan discussed with Dr. Janese Banks and patient to follow-up in August for PET scan.  Discussed port removal following PET scan.   Charge RN in [place] [phone]   Patient expressed understanding and was in agreement with this plan. He also understands that He can call clinic at any time with any questions, concerns, or complaints.   Thank you for allowing me to participate in the care of this very pleasant patient.   Time Total: 20 minutes  Visit consisted of counseling and education dealing with the complex and emotionally intense issues of symptom management in the setting of serious illness.Greater than 50%  of this time was spent counseling and coordinating care related to the above assessment and plan.  Signed by: Altha Harm, PhD, NP-C

## 2022-04-24 NOTE — Progress Notes (Signed)
Patient requesting letter to be sent to [facility] to advocate on his behalf of meals and med dispensing.

## 2022-04-24 NOTE — Progress Notes (Signed)
Patient here for follow-up for head/neck cancer. He has lost 12 pounds since the last visit. He needs a medical note for a high calorie diet while in prison for nutritional support. He is unable to drink any ensures/boost. Pt reports dysphagia and no appetite. He is unable to eat the prison food as it "just doesn't taste good." He was incarcerated about 2 weeks ago and doesn't have access to good nutritional support  Patient needs a note to take back to the prison nurse that meds must be taken food and water if possible. He also needs in writing that he can have boost/ensure up to 3 times a day during med pass or meals.  Port last flushed 3 weeks ago.

## 2022-04-24 NOTE — Progress Notes (Signed)
Unable to reconcile meds. Pt does not know what meds he is on. States the nurse only give him 3 meds and does not allow him to take meds w/o water/food

## 2022-05-15 ENCOUNTER — Ambulatory Visit: Payer: Medicaid Other | Admitting: Radiation Oncology

## 2022-05-15 ENCOUNTER — Telehealth: Payer: Self-pay | Admitting: Hospice and Palliative Medicine

## 2022-05-15 NOTE — Telephone Encounter (Addendum)
Patient mailed [place] a letter regarding ongoing symptomatic concerns with pain and poor oral intake.  I called and spoke with the jail nurse Manuela Schwartz).  Patient had been recently seen and evaluated by the jail medical provider who started patient on scheduled Mobic.  Apparently, the jail cannot accommodate scheduled OTC medications.  Gabapentin and controlled substances cannot be utilized.  Other medications such as viscous lidocaine and Magic mouthwash are not on formulary.  Reportedly, patient does not meet criteria for prison health unit in [place].  We discussed option for checking routine labs.  Also discussed recommendation for house nutritional supplement (i.e. Boost or Ensure)  I left my number for the jail provider to call when she rounds later this week.

## 2022-05-23 ENCOUNTER — Telehealth: Payer: Self-pay | Admitting: *Deleted

## 2022-05-23 ENCOUNTER — Encounter: Payer: Self-pay | Admitting: Physician Assistant

## 2022-05-23 NOTE — Telephone Encounter (Signed)
Josh reviewed labs and there was nothing else needed.

## 2022-05-23 NOTE — Telephone Encounter (Signed)
-----   Message from Irean Hong, NP sent at 05/23/2022  1:05 PM EDT ----- Regarding: RE: lab  results Noted. Thank you.  ----- Message ----- From: Secundino Ginger Sent: 05/23/2022   9:05 AM EDT To: Irean Hong, NP; Gloris Ham, RN Subject: lab  results                                   Labs were sent from [facility]. They are in his chart.

## 2022-05-25 ENCOUNTER — Inpatient Hospital Stay: Payer: Medicaid Other

## 2022-05-25 ENCOUNTER — Other Ambulatory Visit: Payer: Self-pay | Admitting: *Deleted

## 2022-05-25 ENCOUNTER — Encounter: Payer: Self-pay | Admitting: Radiation Oncology

## 2022-05-25 ENCOUNTER — Inpatient Hospital Stay: Payer: Medicaid Other | Attending: Oncology | Admitting: Hospice and Palliative Medicine

## 2022-05-25 ENCOUNTER — Ambulatory Visit
Admission: RE | Admit: 2022-05-25 | Discharge: 2022-05-25 | Disposition: A | Discharge status: Home / Self Care | Payer: Medicaid Other | Source: Ambulatory Visit | Attending: Radiation Oncology | Admitting: Radiation Oncology

## 2022-05-25 VITALS — BP 156/98 | HR 110 | Temp 99.0°F | Resp 20 | Wt 145.0 lb

## 2022-05-25 DIAGNOSIS — C109 Malignant neoplasm of oropharynx, unspecified: Secondary | ICD-10-CM

## 2022-05-25 DIAGNOSIS — Z9221 Personal history of antineoplastic chemotherapy: Secondary | ICD-10-CM | POA: Insufficient documentation

## 2022-05-25 DIAGNOSIS — Z87891 Personal history of nicotine dependence: Secondary | ICD-10-CM | POA: Insufficient documentation

## 2022-05-25 DIAGNOSIS — C01 Malignant neoplasm of base of tongue: Secondary | ICD-10-CM | POA: Diagnosis present

## 2022-05-25 DIAGNOSIS — G893 Neoplasm related pain (acute) (chronic): Secondary | ICD-10-CM | POA: Insufficient documentation

## 2022-05-25 DIAGNOSIS — R252 Cramp and spasm: Secondary | ICD-10-CM

## 2022-05-25 DIAGNOSIS — Z79899 Other long term (current) drug therapy: Secondary | ICD-10-CM | POA: Diagnosis not present

## 2022-05-25 DIAGNOSIS — Z923 Personal history of irradiation: Secondary | ICD-10-CM | POA: Insufficient documentation

## 2022-05-25 DIAGNOSIS — K1230 Oral mucositis (ulcerative), unspecified: Secondary | ICD-10-CM

## 2022-05-25 LAB — BASIC METABOLIC PANEL
Anion gap: 10 (ref 5–15)
BUN: 27 mg/dL — ABNORMAL HIGH (ref 6–20)
CO2: 29 mmol/L (ref 22–32)
Calcium: 9.6 mg/dL (ref 8.9–10.3)
Chloride: 98 mmol/L (ref 98–111)
Creatinine, Ser: 0.98 mg/dL (ref 0.61–1.24)
GFR, Estimated: >60 mL/min (ref >60)
Glucose, Bld: 71 mg/dL (ref 70–99)
Potassium: 4 mmol/L (ref 3.5–5.1)
Sodium: 137 mmol/L (ref 135–145)

## 2022-05-25 LAB — MAGNESIUM: Magnesium: 2.2 mg/dL (ref 1.7–2.4)

## 2022-05-25 MED ORDER — SUCRALFATE 1 G PO TABS: 1.0000 g | ORAL_TABLET | Freq: Three times a day (TID) | ORAL | 6 refills | Status: DC

## 2022-05-25 MED ORDER — MELOXICAM 15 MG PO TABS: 15.0000 mg | ORAL_TABLET | Freq: Every day | ORAL | 0 refills | Status: DC

## 2022-05-25 MED ORDER — MAGIC MOUTHWASH W/LIDOCAINE: 5.0000 mL | Freq: Four times a day (QID) | ORAL | 0 refills | Status: DC

## 2022-05-25 MED ORDER — GABAPENTIN 300 MG PO CAPS: 300.0000 mg | ORAL_CAPSULE | Freq: Three times a day (TID) | ORAL | 1 refills | Status: DC

## 2022-05-25 MED ORDER — DULOXETINE HCL 60 MG PO CPEP: 60.0000 mg | ORAL_CAPSULE | Freq: Every day | ORAL | 3 refills | Status: DC

## 2022-05-25 NOTE — Progress Notes (Signed)
Radiation Oncology Follow up Note  Name: Paul Hebert   Date:   05/25/2022 MRN:  [id number] DOB: 1971-02-02    This 51 y.o. male presents to the clinic today for 1 month follow-up status post concurrent chemoradiation therapy for recurrent stage IVa (T2 N2b M0) p16 positive squamous cell carcinoma the left tongue base.Marland Kitchen  REFERRING PROVIDER: No ref. provider found  HPI: Patient is a 51 year old male now out 1 month after completing concurrent chemoradiation therapy for stage IVa recurrent squamous cell carcinoma of the left tongue base.  He has been incarcerated recently had some difficult times at that time.  Seems he was denied pain medication and any medication for his dysphagia.  He continues to have significant left-sided facial pain.  He is being evaluated by symptom management.  He specifically [place] significant dysphagia at this time..  COMPLICATIONS OF TREATMENT: none  FOLLOW UP COMPLIANCE: keeps appointments   PHYSICAL EXAM:  BP (!) 156/98 (BP Location: Right Arm, Patient Position: Sitting, Cuff Size: Normal)   Pulse (!) 110   Temp 99 F (37.2 C) (Tympanic)   Resp 20   Wt 145 lb (65.8 kg)   BMI 21.73 kg/m  Oral cavity shows some moderate oral mucositis nothing confluent.  Neck is clear.  Well-developed well-nourished patient in NAD. HEENT reveals PERLA, EOMI, discs not visualized.  Oral cavity is clear. No oral mucosal lesions are identified. Neck is clear without evidence of cervical or supraclavicular adenopathy. Lungs are clear to A&P. Cardiac examination is essentially unremarkable with regular rate and rhythm without murmur rub or thrill. Abdomen is benign with no organomegaly or masses noted. Motor sensory and DTR levels are equal and symmetric in the upper and lower extremities. Cranial nerves II through XII are grossly intact. Proprioception is intact. No peripheral adenopathy or edema is identified. No motor or sensory levels are noted. Crude visual fields are within normal  range.  RADIOLOGY RESULTS: No current films for review  PLAN: This time he is working with pain management to relieve some of his symptoms including head pain.  I have asked to see him back in 4 months for follow-up.  We will also reorder a PET scan prior to that visit.  He continues close follow-up care with palliative care.  Patient knows to call with any concerns.  I would like to take this opportunity to thank you for allowing me to participate in the care of your patient.Noreene Filbert, MD

## 2022-05-25 NOTE — Progress Notes (Signed)
Symptom Management and [place] at [facility] Telephone:([phone] Fax:([phone]  Patient Care Team: Pcp, No as PCP - General Noreene Filbert, MD as Referring Physician (Radiation Oncology) Sindy Guadeloupe, MD as Consulting Physician (Oncology) Jules Husbands, MD as Consulting Physician (General Surgery) Gloris Ham, RN as Registered Nurse (Oncology)   NAME OF PATIENT: Paul Hebert  [id number]  1971-10-14   DATE OF VISIT: 05/25/22  REASON FOR CONSULT: Paul Hebert is a 51 y.o. 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  Patient had local recurrence and is again and is status post concurrent chemoradiation. Patient has completed 6 cycles of cisplatin and radiation.   INTERVAL HISTORY: Patient presents to clinic for follow-up with radiation oncology.  He was an add-on to my schedule today to address pain.  Patient has been incarcerated but says that he is now out of jail.  He verbalized severe pain and frustration that the jail was not better managing his pain.    Denies any neurologic complaints. Denies recent fevers or illnesses. Denies any easy bleeding or bruising. Reports fair appetite and denies weight loss. Denies chest pain. Denies any nausea, vomiting, constipation, or diarrhea. Denies urinary complaints. Patient offers no further specific complaints today.  SOCIAL HISTORY:     reports that he quit smoking about 7 weeks ago. His smoking use included cigarettes. He has a 26.25 pack-year smoking history. He uses smokeless tobacco. He reports that he does not currently use alcohol. He reports that he does not currently use drugs after having used the following drugs: Marijuana, "Crack" cocaine, and Methamphetamines.  ADVANCE DIRECTIVES:  Does  not have  CODE STATUS:    PAST MEDICAL HISTORY: Past Medical History:  Diagnosis Date   GERD (gastroesophageal reflux disease)    Tonsil cancer ([place])     PAST SURGICAL HISTORY:  Past Surgical History:  Procedure Laterality Date   LARYNGOSCOPY Left 01/24/2022   Procedure: DIRECT LARYNGOSCOPY WITH TONSIL BIOPSY;  Surgeon: Beverly Gust, MD;  Location: [facility];  Service: ENT;  Laterality: Left;   PORT-A-CATH REMOVAL N/A 02/04/2021   Procedure: REMOVAL PORT-A-CATH;  Surgeon: Olean Ree, MD;  Location: [facility];  Service: General;  Laterality: N/A;   PORTA CATH INSERTION N/A 10/25/2020   Procedure: PORTA CATH INSERTION;  Surgeon: Algernon Huxley, MD;  Location: [place] [facility];  Service: Cardiovascular;  Laterality: N/A;   PORTA CATH INSERTION N/A 02/09/2022   Procedure: PORTA CATH INSERTION;  Surgeon: Algernon Huxley, MD;  Location: [place] [facility];  Service: Cardiovascular;  Laterality: N/A;   REMOVAL OF GASTROSTOMY TUBE N/A 02/04/2021   Procedure: REMOVAL OF GASTROSTOMY TUBE;  Surgeon: Olean Ree, MD;  Location: [facility];  Service: General;  Laterality: N/A;   TONGUE BIOPSY Left 01/24/2022   Procedure: TONGUE BIOPSY;  Surgeon: Beverly Gust, MD;  Location: [facility];  Service: ENT;  Laterality: Left;    HEMATOLOGY/ONCOLOGY HISTORY:  Oncology History  Squamous cell carcinoma of oropharynx ([place])  10/18/2020 Initial Diagnosis   Squamous cell carcinoma of oropharynx ([place])   10/18/2020 Cancer Staging   Staging form: Pharynx - HPV-Mediated Oropharynx, AJCC 8th Edition - Clinical stage from 10/18/2020: Stage I (cT2, cN1, cM0) - Signed by Sindy Guadeloupe, MD on 10/18/2020   11/01/2020 - 12/20/2020 Chemotherapy   Patient is on  Treatment Plan : HEAD/NECK Cisplatin q7d     02/13/2022 -  Chemotherapy   Patient is on Treatment Plan : HEAD/NECK Cisplatin q7d       ALLERGIES:  has No Known Allergies.  MEDICATIONS:  Current Outpatient Medications  Medication Sig Dispense Refill    calcium carbonate (TUMS - DOSED IN MG ELEMENTAL CALCIUM) 500 MG chewable tablet Chew 1 tablet by mouth as needed for indigestion or heartburn.     gabapentin (NEURONTIN) 300 MG capsule Take 1 capsule (300 mg total) by mouth 3 (three) times daily. (Patient not taking: Reported on 05/25/2022) 90 capsule 1   hydrocortisone (CORTEF) 20 MG tablet Take by mouth. (Patient not taking: Reported on 05/25/2022)     lansoprazole (PREVACID) 30 MG capsule Take 1 capsule (30 mg total) by mouth daily at 12 noon. (Patient not taking: Reported on 05/25/2022) 30 capsule 3   lidocaine-prilocaine (EMLA) cream Apply to affected area once (Patient not taking: Reported on 04/05/2022) 30 g 3   magic mouthwash w/lidocaine SOLN Take 5 mLs by mouth 4 (four) times daily. (Patient not taking: Reported on 05/25/2022) 210 mL 0   ondansetron (ZOFRAN) 8 MG tablet Take 1 tablet (8 mg total) by mouth 2 (two) times daily as needed. Start on the third day after cisplatin chemotherapy. (Patient not taking: Reported on 02/13/2022) 30 tablet 1   prochlorperazine (COMPAZINE) 10 MG tablet Take 1 tablet (10 mg total) by mouth every 6 (six) hours as needed (Nausea or vomiting). (Patient not taking: Reported on 05/25/2022) 30 tablet 1   sucralfate (CARAFATE) 1 g tablet Take 1 tablet (1 g total) by mouth 3 (three) times daily before meals. 90 tablet 6   No current facility-administered medications for this visit.   Facility-Administered Medications Ordered in Other Visits  Medication Dose Route Frequency Provider Last Rate Last Admin   heparin lock flush 100 UNIT/ML injection            heparin lock flush 100 UNIT/ML injection            sodium chloride flush (NS) 0.9 % injection 10 mL  10 mL Intravenous PRN Sindy Guadeloupe, MD   10 mL at 04/05/22 1112    VITAL SIGNS: There were no vitals taken for this visit. There were no vitals filed for this visit.  Estimated body mass index is 21.73 kg/m as calculated from the following:   Height as of  04/24/22: 5' 8.5" (1.74 m).   Weight as of an earlier encounter on 05/25/22: 145 lb (65.8 kg).  LABS: CBC:    Component Value Date/Time   WBC 7.7 04/05/2022 1100   HGB 12.0 (L) 04/05/2022 1100   HCT 33.8 (L) 04/05/2022 1100   PLT 270 04/05/2022 1100   MCV 97.7 04/05/2022 1100   NEUTROABS 6.6 04/05/2022 1100   LYMPHSABS 0.8 04/05/2022 1100   MONOABS 0.3 04/05/2022 1100   EOSABS 0.0 04/05/2022 1100   BASOSABS 0.0 04/05/2022 1100   Comprehensive Metabolic Panel:    Component Value Date/Time   NA 135 04/05/2022 1100   K 4.0 04/05/2022 1100   CL 101 04/05/2022 1100   CO2 28 04/05/2022 1100   BUN 14 04/05/2022 1100   CREATININE 0.93 04/05/2022 1100   GLUCOSE 97 04/05/2022 1100   CALCIUM 8.5 (L) 04/05/2022 1100   AST 19 04/05/2022 1100   ALT 16 04/05/2022 1100   ALKPHOS 46 04/05/2022 1100   BILITOT 0.6 04/05/2022 1100   PROT 6.7 04/05/2022 1100   ALBUMIN  3.9 04/05/2022 1100    RADIOGRAPHIC STUDIES: No results found.  PERFORMANCE STATUS (ECOG) : 1 - Symptomatic but completely ambulatory  Review of Systems Unless otherwise noted, a complete review of systems is negative.  Physical Exam General: NAD HEENT: No thrush/ulcers Pulmonary: Unlabored Abdomen: soft, nontender, + bowel sounds GU: no suprapubic tenderness Extremities: no edema, no joint deformities Skin: no rashes Neurological: Weakness but otherwise nonfocal  IMPRESSION: Patient's subjective report of pain seems disproportionate to objective findings.  He states the pain has been excruciating for months.  His pain seemed better controlled when I last saw him and I do not have a good clinical explanation for why his pain would be worsening now that treatments have completed.  Will await results of upcoming PET scan.  I did agree to restart the other medications that have been on hold due to his incarceration such as gabapentin and Magic mouthwash.  Patient was recently started on Mobic and dose of duloxetine was  increased in jail.  Will continue both daily Mobic and higher dose of duloxetine.  Patient seemed most interested in me restarting opioids and was frustrated when I refused to do so.  I suggested that we first wait and see if pain improved after restarting his other home meds.  He repeatedly stated that I was not helping him.  I reminded patient that we had no intention of prescribing opioid pain medications long-term and that he is now status post treatment.  Pain should be improving with resolution of his radiation esophagitis.  Patient has been off opioid pain medications now for over a month.  We can consider referral to pain management if needed.   PLAN: -Restart gabapentin/Magic mouthwash -Refill duloxetine 60 mg daily -Refill Mobic 15 mg daily -Consider referral to pain management if needed -Patient pending PET scan -RTC 3 weeks to see Dr. Janese Banks  Case and plan discussed with Dr. Baruch Gouty  Patient expressed understanding and was in agreement with this plan. He also understands that He can call clinic at any time with any questions, concerns, or complaints.   Thank you for allowing me to participate in the care of this very pleasant patient.   Time Total: 25 minutes  Visit consisted of counseling and education dealing with the complex and emotionally intense issues of symptom management in the setting of serious illness.Greater than 50%  of this time was spent counseling and coordinating care related to the above assessment and plan.  Signed by: Altha Harm, PhD, NP-C

## 2022-05-29 ENCOUNTER — Inpatient Hospital Stay (HOSPITAL_BASED_OUTPATIENT_CLINIC_OR_DEPARTMENT_OTHER): Payer: Medicaid Other | Admitting: Hospice and Palliative Medicine

## 2022-05-29 DIAGNOSIS — C01 Malignant neoplasm of base of tongue: Secondary | ICD-10-CM | POA: Diagnosis not present

## 2022-05-29 DIAGNOSIS — C109 Malignant neoplasm of oropharynx, unspecified: Secondary | ICD-10-CM

## 2022-05-29 NOTE — Progress Notes (Signed)
Symptom Management and [place] at [facility] [phone] [phone]  Patient Care Team: Pcp, No as PCP - General Noreene Filbert, MD as Referring Physician (Radiation Oncology) Sindy Guadeloupe, MD as Consulting Physician (Oncology) Jules Husbands, MD as Consulting Physician (General Surgery) Gloris Ham, RN as Registered Nurse (Oncology)   NAME OF PATIENT: Paul Hebert  [id number]  [birth year]   DATE OF VISIT: 05/29/22  REASON FOR CONSULT: Paul Hebert is a 51 y.o. 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  Patient had local recurrence and is again and is status post concurrent chemoradiation. Patient has completed 6 cycles of cisplatin and radiation.  INTERVAL HISTORY: Patient walked in to the clinic requesting to be seen due to persistent pain. He denies that the pain is any worse in severity or characteristic but states it is the same as he has had for months.  He restarted the gabapentin but says that has not helped.  He was unable to pick up Magic mouthwash from pharmacy. He has been off oxycodone for months given his incarceration.   Denies any neurologic complaints. Denies recent fevers or illnesses. Denies any easy bleeding or bruising. Reports fair appetite and denies weight loss. Denies chest pain. Denies any nausea, vomiting, constipation, or diarrhea. Denies urinary complaints. Patient offers no further specific complaints today.  SOCIAL HISTORY:     reports that he quit smoking about 7 weeks ago. His smoking use included cigarettes. He has a 26.25 pack-year smoking history. He uses smokeless tobacco. He reports that he does not currently use alcohol. He reports that he does not currently use drugs after having used the  following drugs: Marijuana, "Crack" cocaine, and Methamphetamines.  Patient recently incarcerated.  He is not employed.  ADVANCE DIRECTIVES:  Does not have  CODE STATUS:    PAST MEDICAL HISTORY: Past Medical History:  Diagnosis Date   GERD (gastroesophageal reflux disease)    Tonsil cancer ([place])     PAST SURGICAL HISTORY:  Past Surgical History:  Procedure Laterality Date   LARYNGOSCOPY Left 01/24/2022   Procedure: DIRECT LARYNGOSCOPY WITH TONSIL BIOPSY;  Surgeon: Beverly Gust, MD;  Location: [facility] ORS;  Service: ENT;  Laterality: Left;   PORT-A-CATH REMOVAL N/A 02/04/2021   Procedure: REMOVAL PORT-A-CATH;  Surgeon: Olean Ree, MD;  Location: [facility] ORS;  Service: General;  Laterality: N/A;   PORTA CATH INSERTION N/A 10/25/2020   Procedure: PORTA CATH INSERTION;  Surgeon: Algernon Huxley, MD;  Location: [place] [facility];  Service: Cardiovascular;  Laterality: N/A;   PORTA CATH INSERTION N/A 02/09/2022   Procedure: PORTA CATH INSERTION;  Surgeon: Algernon Huxley, MD;  Location: [place] [facility];  Service: Cardiovascular;  Laterality: N/A;   REMOVAL OF GASTROSTOMY TUBE N/A 02/04/2021   Procedure: REMOVAL OF GASTROSTOMY TUBE;  Surgeon: Olean Ree, MD;  Location: [facility] OR[facility];  Service: General;  Laterality: N/A;   TONGUE BIOPSY Left 01/24/2022   Procedure: TONGUE BIOPSY;  Surgeon: Beverly Gust, MD;  Location: [facility] OR[facility];  Service: ENT;  Laterality: Left;    HEMATOLOGY/ONCOLOGY HISTORY:  Oncology History  Squamous cell carcinoma of oropharynx ([place])  10/18/2020 Initial Diagnosis   Squamous cell carcinoma of oropharynx ([place])   10/18/2020 Cancer Staging   Staging form: Pharynx - HPV-Mediated Oropharynx, AJCC 8th Edition - Clinical stage from 10/18/2020:  Stage I (cT2, cN1, cM0) - Signed by Sindy Guadeloupe, MD on 10/18/2020   11/01/2020 - 12/20/2020 Chemotherapy   Patient is on Treatment Plan : HEAD/NECK Cisplatin q7d     02/13/2022 -  Chemotherapy   Patient is on Treatment Plan : HEAD/NECK  Cisplatin q7d       ALLERGIES:  has No Known Allergies.  MEDICATIONS:  Current Outpatient Medications  Medication Sig Dispense Refill   calcium carbonate (TUMS - DOSED IN MG ELEMENTAL CALCIUM) 500 MG chewable tablet Chew 1 tablet by mouth as needed for indigestion or heartburn.     DULoxetine (CYMBALTA) 60 MG capsule Take 1 capsule (60 mg total) by mouth daily. 30 capsule 3   gabapentin (NEURONTIN) 300 MG capsule Take 1 capsule (300 mg total) by mouth 3 (three) times daily. 90 capsule 1   hydrocortisone (CORTEF) 20 MG tablet Take by mouth. (Patient not taking: Reported on 05/25/2022)     lansoprazole (PREVACID) 30 MG capsule Take 1 capsule (30 mg total) by mouth daily at 12 noon. (Patient not taking: Reported on 05/25/2022) 30 capsule 3   lidocaine-prilocaine (EMLA) cream Apply to affected area once (Patient not taking: Reported on 04/05/2022) 30 g 3   magic mouthwash w/lidocaine SOLN Take 5 mLs by mouth 4 (four) times daily. 210 mL 0   meloxicam (MOBIC) 15 MG tablet Take 1 tablet (15 mg total) by mouth daily. 30 tablet 0   ondansetron (ZOFRAN) 8 MG tablet Take 1 tablet (8 mg total) by mouth 2 (two) times daily as needed. Start on the third day after cisplatin chemotherapy. (Patient not taking: Reported on 02/13/2022) 30 tablet 1   prochlorperazine (COMPAZINE) 10 MG tablet Take 1 tablet (10 mg total) by mouth every 6 (six) hours as needed (Nausea or vomiting). (Patient not taking: Reported on 05/25/2022) 30 tablet 1   sucralfate (CARAFATE) 1 g tablet Take 1 tablet (1 g total) by mouth 3 (three) times daily before meals. 90 tablet 6   No current facility-administered medications for this visit.   Facility-Administered Medications Ordered in Other Visits  Medication Dose Route Frequency Provider Last Rate Last Admin   heparin lock flush 100 UNIT/ML injection            heparin lock flush 100 UNIT/ML injection            sodium chloride flush (NS) 0.9 % injection 10 mL  10 mL Intravenous PRN Sindy Guadeloupe, MD   10 mL at 04/05/22 1112    VITAL SIGNS: There were no vitals taken for this visit. There were no vitals filed for this visit.  Estimated body mass index is 21.73 kg/m as calculated from the following:   Height as of 04/24/22: 5' 8.5" (1.74 m).   Weight as of 05/25/22: 145 lb (65.8 kg).  LABS: CBC:    Component Value Date/Time   WBC 7.7 04/05/2022 1100   HGB 12.0 (L) 04/05/2022 1100   HCT 33.8 (L) 04/05/2022 1100   PLT 270 04/05/2022 1100   MCV 97.7 04/05/2022 1100   NEUTROABS 6.6 04/05/2022 1100   LYMPHSABS 0.8 04/05/2022 1100   MONOABS 0.3 04/05/2022 1100   EOSABS 0.0 04/05/2022 1100   BASOSABS 0.0 04/05/2022 1100   Comprehensive Metabolic Panel:    Component Value Date/Time   NA 137 05/25/2022 1158   K 4.0 05/25/2022 1158   CL 98 05/25/2022 1158   CO2 29 05/25/2022 1158   BUN 27 (H) 05/25/2022 1158   CREATININE 0.98  05/25/2022 1158   GLUCOSE 71 05/25/2022 1158   CALCIUM 9.6 05/25/2022 1158   AST 19 04/05/2022 1100   ALT 16 04/05/2022 1100   ALKPHOS 46 04/05/2022 1100   BILITOT 0.6 04/05/2022 1100   PROT 6.7 04/05/2022 1100   ALBUMIN 3.9 04/05/2022 1100    RADIOGRAPHIC STUDIES: No results found.  PERFORMANCE STATUS (ECOG) : 1 - Symptomatic but completely ambulatory  Review of Systems Unless otherwise noted, a complete review of systems is negative.  Physical Exam General: NAD Pulmonary: Unlabored Extremities: no edema, no joint deformities Skin: no rashes Neurological: Grossly nonfocal  Exam deferred  IMPRESSION: As stated in my note last week, I do not have good explanation for why patient's reported pain is so persistently severe. Patient is pending PET scan.  He had significant radiation esophagitis, which was felt to previously be the primary culprit of his pain.  However, this appears to be clinically resolved as he is more than a month out from treatment.  Patient was unable to pick up Magic mouthwash with lidocaine from his  pharmacy as prescribed last week.  I did call his pharmacy today and confirmed that they do have the ingredients and can prepare that for patient to pick up today.  I informed patient of this but he seemed angry at the news.  He again implied that I was not helping him.  I offered to refer patient to pain clinic but he stood up angrily and stated that he was going to the emergency department.  PLAN: -Continue gabapentin/Magic mouthwash/duloxetine/Mobic -Consider referral to pain management -Patient pending PET scan -RTC 3 weeks to see Dr. Janese Banks  Patient expressed understanding and was in agreement with this plan. He also understands that He can call clinic at any time with any questions, concerns, or complaints.   Thank you for allowing me to participate in the care of this very pleasant patient.   Time Total: 15 minutes  Visit consisted of counseling and education dealing with the complex and emotionally intense issues of symptom management in the setting of serious illness.Greater than 50%  of this time was spent counseling and coordinating care related to the above assessment and plan.  Signed by: Altha Harm, PhD, NP-C

## 2022-05-29 NOTE — Progress Notes (Signed)
Patient walked into clinic at 830 am and requested to be seen today for pain control. No vitals taken. RN present for conversation with NP to discuss pt's pain.

## 2022-06-02 ENCOUNTER — Telehealth: Payer: Self-pay | Admitting: *Deleted

## 2022-06-02 NOTE — Telephone Encounter (Signed)
What do you want me to tell him, to go to ER?

## 2022-06-02 NOTE — Telephone Encounter (Signed)
Call returned to patient and left message on voice mail that he needs to go to Urgent Care, ER or call Pain clinic to see when he can be seen

## 2022-06-02 NOTE — Telephone Encounter (Signed)
He is welcome to go to any ER of his choice or urgent care. He can follow-up w/ the [facility] pain clinic to see when they can see him to evaluate his pain.

## 2022-06-02 NOTE — Telephone Encounter (Signed)
Paul Hebert- when last spoke to Paul Hebert, we informed him that Paul Hebert is unable to prescribe any pain medications. He was being referred to the Pain Clinic. Pt stormed out of the clinic and didn't agree to the plan of care. He stated that he would go to the ER for his pain medication. We never saw an ER visit in the system. Unfortunately, he is not on any active treatment at this time. He was instructed to use his mouth wash and gabapentin as directed. Please see Paul Hebert's last office note.

## 2022-06-02 NOTE — Telephone Encounter (Signed)
Message from answering service that patient called asking for a medication management because his mouth is hurting and the medicine is not helping. Please advise

## 2022-06-06 ENCOUNTER — Other Ambulatory Visit: Payer: Self-pay | Admitting: *Deleted

## 2022-06-06 ENCOUNTER — Other Ambulatory Visit: Payer: Self-pay

## 2022-06-06 ENCOUNTER — Inpatient Hospital Stay (HOSPITAL_BASED_OUTPATIENT_CLINIC_OR_DEPARTMENT_OTHER): Payer: Medicaid Other | Admitting: Oncology

## 2022-06-06 DIAGNOSIS — G893 Neoplasm related pain (acute) (chronic): Secondary | ICD-10-CM

## 2022-06-06 DIAGNOSIS — C109 Malignant neoplasm of oropharynx, unspecified: Secondary | ICD-10-CM

## 2022-06-06 DIAGNOSIS — C01 Malignant neoplasm of base of tongue: Secondary | ICD-10-CM | POA: Diagnosis not present

## 2022-06-06 NOTE — Progress Notes (Signed)
He states that he has open wound in mouth around tongue,  he drinks coffee, water-15-20 bottles a day, oat meal. His hand and feet lock up and he thinks it is low potassium. But the last time he came the potassium level was normal. The MMW works for him for a little bit of time but then when he eats it starts the pain again. He states that if he eats anything as soon as it hits the tongue area with the sore it it moves to the ear and then the eye on left side

## 2022-06-07 ENCOUNTER — Telehealth: Payer: Self-pay | Admitting: *Deleted

## 2022-06-07 ENCOUNTER — Ambulatory Visit
Admission: RE | Admit: 2022-06-07 | Discharge: 2022-06-07 | Disposition: A | Discharge status: Home / Self Care | Payer: Medicaid Other | Source: Ambulatory Visit | Attending: Oncology | Admitting: Oncology

## 2022-06-07 ENCOUNTER — Encounter: Payer: Self-pay | Admitting: Oncology

## 2022-06-07 DIAGNOSIS — C109 Malignant neoplasm of oropharynx, unspecified: Secondary | ICD-10-CM | POA: Insufficient documentation

## 2022-06-07 MED ORDER — OXYCODONE HCL 5 MG PO TABS: 5.0000 mg | ORAL_TABLET | Freq: Two times a day (BID) | ORAL | 0 refills | Status: DC

## 2022-06-07 MED ORDER — GABAPENTIN 600 MG PO TABS: 600.0000 mg | ORAL_TABLET | Freq: Three times a day (TID) | ORAL | 0 refills | Status: DC

## 2022-06-07 MED ORDER — IOHEXOL 300 MG/ML  SOLN
75.0000 mL | Freq: Once | INTRAMUSCULAR | Status: AC | PRN
  Administered 2022-06-07: 75 mL via INTRAVENOUS

## 2022-06-07 NOTE — Telephone Encounter (Signed)
Pt came over to see if hte meds was put in and I did them but the we wanted to see if he could get it paid for through [facility]. I spoke to sabrina and she says that he is on the  list . Since he was here then he needs to call ENT and I gave Anderson Malta the phone number for ENT and he said he will call and get appt.

## 2022-06-07 NOTE — Telephone Encounter (Signed)
I called the 2 numbers on the chart and neither has voicemail set up and did not answer. I then called the brother- emergency contact and left him a message that I am trying to get in touch with the pt. Because we will [facility] and also that ENT doctor trying to call to get him an appts. Left  my number to call me back

## 2022-06-08 ENCOUNTER — Other Ambulatory Visit: Payer: Medicaid Other

## 2022-06-10 ENCOUNTER — Emergency Department: Payer: Medicaid Other

## 2022-06-10 ENCOUNTER — Other Ambulatory Visit: Payer: Self-pay

## 2022-06-10 ENCOUNTER — Emergency Department
Admission: EM | Admit: 2022-06-10 | Discharge: 2022-06-11 | Disposition: A | Discharge status: Other Acute Inpatient Hospital | Payer: Medicaid Other | Attending: Emergency Medicine | Admitting: Emergency Medicine

## 2022-06-10 DIAGNOSIS — K920 Hematemesis: Secondary | ICD-10-CM | POA: Diagnosis present

## 2022-06-10 DIAGNOSIS — I959 Hypotension, unspecified: Secondary | ICD-10-CM | POA: Insufficient documentation

## 2022-06-10 DIAGNOSIS — C140 Malignant neoplasm of pharynx, unspecified: Secondary | ICD-10-CM | POA: Diagnosis not present

## 2022-06-10 DIAGNOSIS — R578 Other shock: Secondary | ICD-10-CM | POA: Diagnosis not present

## 2022-06-10 LAB — CBC WITH DIFFERENTIAL/PLATELET
Abs Immature Granulocytes: 0.31 10*3/uL — ABNORMAL HIGH (ref 0.00–0.07)
Basophils Absolute: 0 10*3/uL (ref 0.0–0.1)
Basophils Relative: 0 %
Eosinophils Absolute: 0 10*3/uL (ref 0.0–0.5)
Eosinophils Relative: 0 %
HCT: 29.2 % — ABNORMAL LOW (ref 39.0–52.0)
Hemoglobin: 10 g/dL — ABNORMAL LOW (ref 13.0–17.0)
Immature Granulocytes: 3 %
Lymphocytes Relative: 28 %
Lymphs Abs: 3.1 10*3/uL (ref 0.7–4.0)
MCH: 35.6 pg — ABNORMAL HIGH (ref 26.0–34.0)
MCHC: 34.2 g/dL (ref 30.0–36.0)
MCV: 103.9 fL — ABNORMAL HIGH (ref 80.0–100.0)
Monocytes Absolute: 1.1 10*3/uL — ABNORMAL HIGH (ref 0.1–1.0)
Monocytes Relative: 10 %
Neutro Abs: 6.6 10*3/uL (ref 1.7–7.7)
Neutrophils Relative %: 59 %
Platelets: 305 10*3/uL (ref 150–400)
RBC: 2.81 MIL/uL — ABNORMAL LOW (ref 4.22–5.81)
RDW: 13.2 % (ref 11.5–15.5)
WBC: 11 10*3/uL — ABNORMAL HIGH (ref 4.0–10.5)
nRBC: 0.2 % (ref 0.0–0.2)

## 2022-06-10 LAB — COMPREHENSIVE METABOLIC PANEL
ALT: 27 U/L (ref 0–44)
AST: 24 U/L (ref 15–41)
Albumin: 3.3 g/dL — ABNORMAL LOW (ref 3.5–5.0)
Alkaline Phosphatase: 53 U/L (ref 38–126)
Anion gap: 9 (ref 5–15)
BUN: 16 mg/dL (ref 6–20)
CO2: 25 mmol/L (ref 22–32)
Calcium: 7.7 mg/dL — ABNORMAL LOW (ref 8.9–10.3)
Chloride: 102 mmol/L (ref 98–111)
Creatinine, Ser: 1 mg/dL (ref 0.61–1.24)
GFR, Estimated: >60 mL/min (ref >60)
Glucose, Bld: 125 mg/dL — ABNORMAL HIGH (ref 70–99)
Potassium: 3.3 mmol/L — ABNORMAL LOW (ref 3.5–5.1)
Sodium: 136 mmol/L (ref 135–145)
Total Bilirubin: 0.6 mg/dL (ref 0.3–1.2)
Total Protein: 5.7 g/dL — ABNORMAL LOW (ref 6.5–8.1)

## 2022-06-10 LAB — PROTIME-INR
INR: 0.9 (ref 0.8–1.2)
Prothrombin Time: 12.1 seconds (ref 11.4–15.2)

## 2022-06-10 LAB — APTT: aPTT: 24 seconds (ref 24–36)

## 2022-06-10 LAB — ABO/RH: ABO/RH(D): A POS

## 2022-06-10 MED ORDER — MIDAZOLAM-SODIUM CHLORIDE 100-0.9 MG/100ML-% IV SOLN
0.5000 mg/h | INTRAVENOUS | Status: DC
  Administered 2022-06-10: 3 mg/h via INTRAVENOUS
  Filled 2022-06-10: qty 100

## 2022-06-10 MED ORDER — ETOMIDATE 2 MG/ML IV SOLN
0.3000 mg/kg | Freq: Once | INTRAVENOUS | Status: DC
  Filled 2022-06-10: qty 20

## 2022-06-10 MED ORDER — ROCURONIUM BROMIDE 50 MG/5ML IV SOLN
INTRAVENOUS | Status: DC | PRN
  Administered 2022-06-10: 79.1 mg via INTRAVENOUS

## 2022-06-10 MED ORDER — FENTANYL 2500MCG IN NS 250ML (10MCG/ML) PREMIX INFUSION
0.0000 ug/h | INTRAVENOUS | Status: DC
  Administered 2022-06-10 (×2): 100 ug/h via INTRAVENOUS
  Filled 2022-06-10: qty 250

## 2022-06-10 MED ORDER — SODIUM CHLORIDE 0.9 % IV SOLN: 10.0000 mL/h | Freq: Once | INTRAVENOUS | Status: DC

## 2022-06-10 MED ORDER — PANTOPRAZOLE SODIUM 40 MG IV SOLR: 40.0000 mg | Freq: Once | INTRAVENOUS | Status: DC

## 2022-06-10 MED ORDER — ETOMIDATE 2 MG/ML IV SOLN
INTRAVENOUS | Status: DC | PRN
  Administered 2022-06-10: 23.74 mg via INTRAVENOUS

## 2022-06-10 MED ORDER — TRANEXAMIC ACID-NACL 1000-0.7 MG/100ML-% IV SOLN
1000.0000 mg | INTRAVENOUS | Status: AC
  Administered 2022-06-10: 1000 mg via INTRAVENOUS
  Filled 2022-06-10: qty 100

## 2022-06-10 MED ORDER — ROCURONIUM BROMIDE 10 MG/ML (PF) SYRINGE
PREFILLED_SYRINGE | INTRAVENOUS | Status: AC
  Filled 2022-06-10: qty 10

## 2022-06-10 MED ORDER — FENTANYL BOLUS VIA INFUSION
100.0000 ug | Freq: Once | INTRAVENOUS | Status: AC
  Administered 2022-06-10: 100 ug via INTRAVENOUS

## 2022-06-10 MED ORDER — LIDOCAINE HCL URETHRAL/MUCOSAL 2 % EX GEL
1.0000 | Freq: Once | CUTANEOUS | Status: AC
  Administered 2022-06-10: 1

## 2022-06-10 MED ORDER — SODIUM CHLORIDE 0.9 % IV SOLN
10.0000 mL/h | Freq: Once | INTRAVENOUS | Status: AC
  Administered 2022-06-10: 10 mL/h via INTRAVENOUS

## 2022-06-10 MED ORDER — PHENYLEPHRINE 80 MCG/ML (10ML) SYRINGE FOR IV PUSH (FOR BLOOD PRESSURE SUPPORT)
80.0000 ug | PREFILLED_SYRINGE | Freq: Once | INTRAVENOUS | Status: DC | PRN
  Filled 2022-06-10: qty 10

## 2022-06-10 MED ORDER — ROCURONIUM BROMIDE 10 MG/ML (PF) SYRINGE: 1.0000 mg/kg | PREFILLED_SYRINGE | Freq: Once | INTRAVENOUS | Status: DC

## 2022-06-10 NOTE — ED Notes (Signed)
Per Rt, tube 7.5, 23 at lip

## 2022-06-10 NOTE — ED Notes (Signed)
EDP Stafford at bedside preparing for intubation

## 2022-06-10 NOTE — ED Notes (Signed)
Pt resting in bed.  Fiance in room at this time.

## 2022-06-10 NOTE — ED Provider Notes (Addendum)
[facility] Provider Note    Event Date/Time   First MD Initiated Contact with Patient 06/10/22 2042     (approximate)   History   Chief Complaint: Hematemesis (Pt arrived from home via EMS for c/o hematemesis. Pt is currently on chemo for throat/tongue cancer. Per EMS pt vomitted approx x1 Liter of blood. Bleeding controlled upon arrival, pt A&O x4, protecting own airway. ERP @ bedside. )   HPI  Paul Hebert is a 51 y.o. male with a history of polysubstance abuse, depression, squamous cell carcinoma of the oropharynx status post 2 rounds of chemotherapy and radiation, most recently completed about 8 weeks ago, who is brought to the ED today due to hematemesis.  Reviewing outside records, patient has been complaining of of throat pain for the past few weeks, worse with eating and swallowing.  He has been adhering to a soft diet.  He had a CT scan done 3 days ago which started a large area of ulcerated necrotic tissue about the left tonsillar mass.  Today, after eating pancakes for dinner he started having brisk bleeding from his throat, vomiting up large amounts of blood.  EMS estimates 1 L of blood loss on scene.  Initial blood pressure was 80/50.  They gave 1 L IV fluid bolus prior to arrival.     Physical Exam   Triage Vital Signs: ED Triage Vitals  Enc Vitals Group     BP 06/10/22 2035 113/81     Pulse Rate 06/10/22 2035 94     Resp 06/10/22 2035 (!) 26     Temp 06/10/22 2035 98 F (36.7 C)     Temp Source 06/10/22 2035 Oral     SpO2 06/10/22 2035 97 %     Weight 06/10/22 2036 150 lb (68 kg)     Height 06/10/22 2036 '5\' 8"'$  (1.727 m)     Head Circumference --      Peak Flow --      Pain Score --      Pain Loc --      Pain Edu? --      Excl. in [place]? --     Most recent vital signs: Vitals:   06/10/22 2301 06/10/22 2305  BP: (!) 228/112 (!) 202/139  Pulse: 78 98  Resp: (!) 30 17  Temp:    SpO2: 91% 99%    General: Awake, no distress.   CV:  Good peripheral perfusion.  Regular rate and rhythm, heart rate 90 Resp:  Normal effort.  Clear to auscultation bilaterally Abd:  No distention.  Soft nontender Other:  Dried blood on the face, external naris, and anterior chest and legs.  There is coagulated blood in the mouth.  No active red bleeding.  Patient maintaining airway.  There is a sizable clot adherent to the left tonsil.   ED Results / Procedures / Treatments   Labs (all labs ordered are listed, but only abnormal results are displayed) Labs Reviewed  CBC WITH DIFFERENTIAL/PLATELET - Abnormal; Notable for the following components:      Result Value   WBC 11.0 (*)    RBC 2.81 (*)    Hemoglobin 10.0 (*)    HCT 29.2 (*)    MCV 103.9 (*)    MCH 35.6 (*)    Monocytes Absolute 1.1 (*)    Abs Immature Granulocytes 0.31 (*)    All other components within normal limits  COMPREHENSIVE METABOLIC PANEL - Abnormal; Notable for the following  components:   Potassium 3.3 (*)    Glucose, Bld 125 (*)    Calcium 7.7 (*)    Total Protein 5.7 (*)    Albumin 3.3 (*)    All other components within normal limits  PROTIME-INR  APTT  TYPE AND SCREEN  PREPARE RBC (CROSSMATCH)  PREPARE FRESH FROZEN PLASMA  ABO/RH     EKG    RADIOLOGY CT soft tissue neck from June 07, 2022 images interpreted by me, showing ulcerated mass at the left tonsil, most likely culprit lesion for current bleeding.   PROCEDURES:  .Critical Care  Performed by: Carrie Mew, MD Authorized by: Carrie Mew, MD   Critical care provider statement:    Critical care time (minutes):  90   Critical care time was exclusive of:  Separately billable procedures and treating other patients   Critical care was necessary to treat or prevent imminent or life-threatening deterioration of the following conditions:  Shock, circulatory failure and respiratory failure   Critical care was time spent personally by me on the following activities:  Development  of treatment plan with patient or surrogate, discussions with consultants, evaluation of patient's response to treatment, examination of patient, obtaining history from patient or surrogate, ordering and performing treatments and interventions, ordering and review of laboratory studies, ordering and review of radiographic studies, pulse oximetry, re-evaluation of patient's condition, review of old charts and ventilator management   Care discussed with: accepting provider at another facility   Comments:        Procedure Name: Intubation Date/Time: 06/10/2022 11:34 PM  Performed by: Carrie Mew, MDPre-anesthesia Checklist: Patient identified, Patient being monitored, Emergency Drugs available, Timeout performed and Suction available Oxygen Delivery Method: Non-rebreather mask Preoxygenation: Pre-oxygenation with 100% oxygen Induction Type: Rapid sequence Ventilation: Mask ventilation without difficulty Laryngoscope Size: Glidescope and 3 Grade View: Grade I Tube size: 7.5 mm Number of attempts: 1 Airway Equipment and Method: Video-laryngoscopy and Rigid stylet Placement Confirmation: ETT inserted through vocal cords under direct vision, CO2 detector and Breath sounds checked- equal and bilateral Secured at: 23 cm Tube secured with: ETT holder Dental Injury: Teeth and Oropharynx as per pre-operative assessment  Comments:       Fiberoptic laryngoscopy  Date/Time: 06/10/2022 11:35 PM  Performed by: Carrie Mew, MD Authorized by: Carrie Mew, MD  Consent: The procedure was performed in an emergent situation. Verbal consent obtained. Consent given by: patient Required items: required blood products, implants, devices, and special equipment available Patient identity confirmed: verbally with patient Local anesthesia used: yes Anesthesia: local infiltration  Anesthesia: Local anesthesia used: yes Local Anesthetic: lidocaine 1% without epinephrine Anesthetic total: 5  mL  Sedation: Patient sedated: no  Comments: Pt unable to tolerate attempted passage of fiberoptic scope through nasal passage, procedure aborted     OG placement  Date/Time: 06/10/2022 11:36 PM  Performed by: Carrie Mew, MD Authorized by: Carrie Mew, MD  Consent: The procedure was performed in an emergent situation. Required items: required blood products, implants, devices, and special equipment available Time out: Immediately prior to procedure a "time out" was called to verify the correct patient, procedure, equipment, support staff and site/side marked as required. Local anesthesia used: no  Anesthesia: Local anesthesia used: no  Sedation: Patient sedated: no  Patient tolerance: patient tolerated the procedure well with no immediate complications Comments: Procedure performed by me with glidescope visualization to avoid disrupting clot on L tonsil hemorrhagic mass due to risk of recurrent hemorrhage.      MEDICATIONS ORDERED IN  ED: Medications  0.9 %  sodium chloride infusion (10 mL/hr Intravenous Not Given 06/10/22 2052)  etomidate (AMIDATE) injection 23.74 mg (has no administration in time range)  rocuronium bromide 10 mg/mL (PF) syringe (has no administration in time range)  PHENYLephrine 80 mcg/ml in normal saline Adult IV Push Syringe (For Blood Pressure Support) (has no administration in time range)  fentaNYL 2513mg in NS 2547m(1077mml) infusion-PREMIX (100 mcg/hr Intravenous New Bag/Given 06/10/22 2311)  midazolam (VERSED) 100 mg/100 mL (1 mg/mL) premix infusion (3 mg/hr Intravenous New Bag/Given 06/10/22 2309)  rocuronium bromide 100 MG/10ML SOSY (has no administration in time range)  etomidate (AMIDATE) injection (23.74 mg Intravenous Given 06/10/22 2253)  rocuronium (ZEMURON) injection (79.1 mg Intravenous Given 06/10/22 2254)  tranexamic acid (CYKLOKAPRON) IVPB 1,000 mg (0 mg Intravenous Stopped 06/10/22 2128)  0.9 %  sodium chloride infusion (10  mL/hr Intravenous New Bag/Given 06/10/22 2050)  lidocaine (XYLOCAINE) 2 % jelly 1 Application (1 Application Other Given 06/10/22 2047)     IMPRESSION / MDM / ASSESSMENT AND PLAN / ED COURSE  I reviewed the triage vital signs and the nursing notes.                              Differential diagnosis includes, but is not limited to, acute blood loss anemia, hemorrhagic shock, AKI, necrotic tumor, bleeding stomach ulcer  Patient's presentation is most consistent with acute presentation with potential threat to life or bodily function.  Patient presents with hematemesis and evidence of hemorrhagic shock with altered mental status and hypotension on arrival.  When he first arrived to the ED, bleeding had stopped on its own and there is evidence of clot adherent to the likely area of bleeding.  During examination, I was careful not to disrupt this.  Initial hemoglobin level is 10, indicative of sizable blood loss in the early phase with the associated hypotension.  2 units of red cells and 2 units FFP ordered for stabilization and intravascular volume reexpansion in addition to IV fluid boluses already administered.  I had a frank discussion with the patient regarding risk of recurrent bleeding which could cause him to asphyxiate or suffer fatal hemorrhagic shock.  Advised him of the need to intubate him for throat packing if he had any additional bleeding.  I then discussed his care with ENT Dr. McQTami Ribaso is very familiar with the patient and has cared for him through his cancer management, along with vascular surgery consulted Dr. CheBridgett Larssono both advised that they are unable to provide any advanced interventions should the patient start bleeding again.  Patient would need IR which is not available at this hospital tonight on the weekend.  Because of this, patient requires transfer, and [facility] due to advanced ENT service that may have additional  surgical options.  [facility] Bra[facility]yline Beadyo [facility] the patient.  Due to the need for transfer, patient required intubation for airway protection, to which he and his spouse at bedside both agreed.  I attempted to intubate nasotracheally after RSI sedation with etomidate and rocuronium, however was unable to pass the fiberoptic scope due to distortion of the nasal anatomy.  Therefore I used a glide scope and was able to carefully bypassed the left tonsillar mass without causing any bleeding and orotracheally intubate and inserted an orogastric tube as well.  No evidence  of any bleeding after this was completed.  Oxygenation is stable on minimal vent settings.  Postintubation chest x-ray shows endotracheal tube in good position with 2 cm above the carina.  Orogastric tube is in good position with sideport in the gastric body.  Sedation being maintained with fentanyl and Versed continuous infusions  ----------------------------------------- 11:59 PM on 06/10/2022 ----------------------------------------- Had follow up call with ENT team - now accepted to SICU with ENT attending Dr. 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   Carrie Mew, MD 06/10/22 [place]    Carrie Mew, MD 06/10/22 [phone]

## 2022-06-10 NOTE — ED Notes (Signed)
Called [facility] for possible transfer. [facility] [employer] and [employer] faxed.

## 2022-06-11 MED ORDER — FENTANYL CITRATE PF 50 MCG/ML IJ SOSY
100.0000 ug | PREFILLED_SYRINGE | Freq: Once | INTRAMUSCULAR | Status: AC
  Administered 2022-06-11: 100 ug via INTRAVENOUS

## 2022-06-11 MED ORDER — SODIUM CHLORIDE 0.9 % IV SOLN: Freq: Once | INTRAVENOUS | Status: AC

## 2022-06-11 MED ORDER — MIDAZOLAM HCL 10 MG/2ML IJ SOLN
10.0000 mg | Freq: Once | INTRAMUSCULAR | Status: AC
  Administered 2022-06-11: 10 mg via INTRAVENOUS
  Filled 2022-06-11: qty 2

## 2022-06-11 NOTE — ED Notes (Signed)
Report given to Barnett Applebaum at [facility] ICU 2708, [phone].

## 2022-06-11 NOTE — ED Notes (Signed)
Pt adequately sedated at this time.

## 2022-06-11 NOTE — ED Notes (Signed)
[facility] team remain in room at this time.

## 2022-06-11 NOTE — ED Notes (Signed)
Pt unable to sign consent to transfer d/t intubation and sedation.

## 2022-06-11 NOTE — ED Notes (Signed)
Report given to Saralyn Pilar with [facility] at [phone].

## 2022-06-11 NOTE — ED Notes (Signed)
2nd units of RBCs and FFP given to transport.  Per transport, they will stop the fentanyl and versed and make there own drips upon transfer.

## 2022-06-11 NOTE — ED Notes (Signed)
pt accepted to [facility] ICU 2708 per Joellen Jersey, coordinator. Call report to [phone] option 2.

## 2022-06-11 NOTE — ED Notes (Signed)
Pt sat up alert and trying to pull tube out, coughing blood.  This RN and Dorian RN in room and able to subdue pt.  EDP D Tamala Julian called into room and verbally ordered 128mg bolus fentanyl with a 10 mg bolus versed. EDP also ordered fentanyl increase drip to 200 mcg/hour and versed to 10 mg/hour.

## 2022-06-12 LAB — TYPE AND SCREEN
ABO/RH(D): A POS
Antibody Screen: NEGATIVE
Unit division: 0
Unit division: 0

## 2022-06-12 LAB — BPAM RBC
Blood Product Expiration Date: 202309182359
Blood Product Expiration Date: 202309182359
ISSUE DATE / TIME: 202308262215
ISSUE DATE / TIME: 202308270113
Unit Type and Rh: 5100
Unit Type and Rh: 5100

## 2022-06-12 LAB — BPAM FFP
Blood Product Expiration Date: 202308312359
Blood Product Expiration Date: 202308312359
ISSUE DATE / TIME: 202308262215
ISSUE DATE / TIME: 202308270113
Unit Type and Rh: 6200
Unit Type and Rh: 6200

## 2022-06-12 LAB — PREPARE FRESH FROZEN PLASMA: Unit division: 0

## 2022-06-13 LAB — DRUG SCREEN 764883 11+OXYCO+ALC+CRT-BUND
Amphetamines, Urine: NEGATIVE ng/mL
BENZODIAZ UR QL: NEGATIVE ng/mL
Barbiturate: NEGATIVE ng/mL
Creatinine: 25.8 mg/dL (ref 20.0–300.0)
Ethanol: NEGATIVE %
Meperidine: NEGATIVE ng/mL
Methadone Screen, Urine: NEGATIVE ng/mL
OPIATE SCREEN URINE: NEGATIVE ng/mL
Oxycodone/Oxymorphone, Urine: NEGATIVE ng/mL
Phencyclidine: NEGATIVE ng/mL
Propoxyphene, Urine: NEGATIVE ng/mL
Tramadol: NEGATIVE ng/mL
pH, Urine: 7 (ref 4.5–8.9)

## 2022-06-13 LAB — CANNABINOID CONFIRMATION, UR
CANNABINOIDS: POSITIVE — AB
Carboxy THC GC/MS Conf: 42 ng/mL

## 2022-06-13 LAB — COCAINE CONF, UR
Benzoylecgonine GC/MS Conf: 203 ng/mL
Cocaine Metab Quant, Ur: POSITIVE — AB

## 2022-06-13 LAB — MISC LABCORP TEST (SEND OUT): Labcorp test code: 791194

## 2022-06-15 ENCOUNTER — Ambulatory Visit: Payer: Medicaid Other

## 2022-06-16 ENCOUNTER — Inpatient Hospital Stay: Payer: Medicaid Other | Attending: Oncology | Admitting: Oncology

## 2022-06-16 ENCOUNTER — Other Ambulatory Visit: Payer: Medicaid Other

## 2022-06-16 ENCOUNTER — Ambulatory Visit: Payer: Medicaid Other | Admitting: Oncology

## 2022-06-16 DIAGNOSIS — G893 Neoplasm related pain (acute) (chronic): Secondary | ICD-10-CM | POA: Insufficient documentation

## 2022-06-16 DIAGNOSIS — F1490 Cocaine use, unspecified, uncomplicated: Secondary | ICD-10-CM | POA: Insufficient documentation

## 2022-06-16 DIAGNOSIS — Z79891 Long term (current) use of opiate analgesic: Secondary | ICD-10-CM | POA: Insufficient documentation

## 2022-06-16 DIAGNOSIS — Z79899 Other long term (current) drug therapy: Secondary | ICD-10-CM | POA: Insufficient documentation

## 2022-06-16 DIAGNOSIS — C01 Malignant neoplasm of base of tongue: Secondary | ICD-10-CM | POA: Insufficient documentation

## 2022-06-16 DIAGNOSIS — R45851 Suicidal ideations: Secondary | ICD-10-CM | POA: Insufficient documentation

## 2022-06-16 DIAGNOSIS — K1230 Oral mucositis (ulcerative), unspecified: Secondary | ICD-10-CM | POA: Insufficient documentation

## 2022-06-19 LAB — PREPARE RBC (CROSSMATCH)

## 2022-06-19 NOTE — Progress Notes (Signed)
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 Patient states that he presented with these complaints 6 months ago and had a CT soft tissue neck which did not show any malignancy.  He continued to have on and off pain which was gradually getting worse and he presented back to the ER on 09/27/2020.     CT soft tissue neck showed a left base of tongue mass measuring 3.4 x 2.6 x 3.3 cm which was invading the tongue along the anterior and medial margin and extending into the left vallecula.  Possible superior and posterior extension to involve the left palatine tonsil the lesion abuts the uvula with early extension into the soft palate.  Multiple left level 2 lymph nodes largest of which was 1.2 cm.  Additional necrotic limited to level 3 nodal conglomerate measures 1.3 cm.  No right-sided adenopathy.   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  PET CT scan Showed left base of tongue mass measuring about 2.8 cm with enlarged centrally necrotic level 2/3 left-sided and lymph nodes with an SUV about 3.7.  No hypermetabolic right neck nodes.  No findings of distant metastatic disease   Patient completed 5 cycles of weekly cisplatin chemotherapy between January to March 2022.  There were treatment interruptions  due to mucositis.  He could not complete 7 cycles eventually.  He did complete his radiation treatment.   Towards the end of radiation patient was admitted for suicidal ideation.  At the end of chemoradiation patient did not have any evidence of residual disease.  However he was found to have biopsy-proven local recurrence in March 2023.    Patient completed second round of chemoradiation in late June 2023  Interval history-patient recently got out of jail after he was incarcerated for about 4 to 6 weeks.  He did not get any pain medications while he was incarcerated and states that his pain was out of control and currently remains uncontrolled as well.  He does not have any narcotics with him.  He has been trying round-the-clock gabapentin and Tylenol as well as NSAIDs but this is not helping him.  States that he did not have any other option but to use marijuana to try to get him more comfortable.  ECOG PS- 1 Pain scale- 10 Opioid associated constipation- no  Review of systems- Review of Systems  Constitutional:  Negative for chills, fever, malaise/fatigue and weight loss.  HENT:  Negative for congestion, ear discharge and nosebleeds.        Throat pain  Eyes:  Negative for blurred vision.  Respiratory:  Negative for cough, hemoptysis, sputum production, shortness of breath and wheezing.   Cardiovascular:  Negative for chest pain, palpitations, orthopnea and claudication.  Gastrointestinal:  Negative for abdominal pain, blood in stool, constipation, diarrhea, heartburn, melena, nausea and vomiting.  Genitourinary:  Negative for dysuria, flank pain,  frequency, hematuria and urgency.  Musculoskeletal:  Negative for back pain, joint pain and myalgias.  Skin:  Negative for rash.  Neurological:  Negative for dizziness, tingling, focal weakness, seizures, weakness and headaches.  Endo/Heme/Allergies:  Does not bruise/bleed easily.  Psychiatric/Behavioral:  Negative for depression and suicidal  ideas. The patient does not have insomnia.       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[facility])      Past Surgical History:  Procedure Laterality Date   LARYNGOSCOPY Left 01/24/2022   Procedure: DIRECT LARYNGOSCOPY WITH TONSIL BIOPSY;  Surgeon: Beverly Gust, MD;  Location: [facility];  Service: ENT;  Laterality: Left;   PORT-A-CATH REMOVAL N/A 02/04/2021   Procedure: REMOVAL PORT-A-CATH;  Surgeon: Olean Ree, MD;  Location: [facility];  Service: General;  Laterality: N/A;   PORTA CATH INSERTION N/A 10/25/2020   Procedure: PORTA CATH INSERTION;  Surgeon: Algernon Huxley, MD;  Location: [place] [facility];  Service: Cardiovascular;  Laterality: N/A;   PORTA CATH INSERTION N/A 02/09/2022   Procedure: PORTA CATH INSERTION;  Surgeon: Algernon Huxley, MD;  Location: [place] [facility];  Service: Cardiovascular;  Laterality: N/A;   REMOVAL OF GASTROSTOMY TUBE N/A 02/04/2021   Procedure: REMOVAL OF GASTROSTOMY TUBE;  Surgeon: Olean Ree, MD;  Location: [facility];  Service: General;  Laterality: N/A;   TONGUE BIOPSY Left 01/24/2022   Procedure: TONGUE BIOPSY;  Surgeon: Beverly Gust, MD;  Location: [facility];  Service: ENT;  Laterality: Left;    Social History   Socioeconomic History   Marital status: Single    Spouse name: Not on file   Number of children: Not on file   Years of education: Not on file   Highest education level: Not on file  Occupational History   Not on file  Tobacco Use   Smoking status: Former    Packs/day: 0.75    Years: 35.00    Total pack years: 26.25    Types: Cigarettes    Quit date: 04/06/2022    Years since quitting: 0.2   Smokeless tobacco: Current   Tobacco comments:    Patches very rarely  Vaping Use   Vaping Use: Former  Substance and Sexual Activity   Alcohol use: Not Currently    Comment: occ- 3 times a week   Drug use: Not Currently    Types: Marijuana, "Crack"  cocaine, Methamphetamines    Comment: marijuana every day except the joint   Sexual activity: Not Currently  Other Topics Concern   Not on file  Social History Narrative   Not on file   Social Determinants of Health   Financial Resource Strain: Not on file  Food Insecurity: Not on file  Transportation Needs: Not on file  Physical Activity: Not on file  Stress: Not on file  Social Connections: Not on file  Intimate Partner Violence: Not on file    Family History  Problem Relation Age of Onset   Cancer Father      Current Outpatient Medications:    calcium carbonate (TUMS - DOSED IN MG ELEMENTAL CALCIUM) 500 MG chewable tablet, Chew 1 tablet by mouth as needed for indigestion or heartburn., Disp: , Rfl:    DULoxetine (CYMBALTA) 60 MG capsule, Take 1 capsule (60 mg total) by mouth daily., Disp: 30 capsule, Rfl: 3   lansoprazole (PREVACID) 30 MG capsule, Take 1 capsule (30 mg total) by mouth daily  at 12 noon., Disp: 30 capsule, Rfl: 3   magic mouthwash w/lidocaine SOLN, Take 5 mLs by mouth 4 (four) times daily., Disp: 210 mL, Rfl: 0   meloxicam (MOBIC) 15 MG tablet, Take 1 tablet (15 mg total) by mouth daily., Disp: 30 tablet, Rfl: 0   sucralfate (CARAFATE) 1 g tablet, Take 1 tablet (1 g total) by mouth 3 (three) times daily before meals., Disp: 90 tablet, Rfl: 6   gabapentin (NEURONTIN) 600 MG tablet, Take 1 tablet (600 mg total) by mouth 3 (three) times daily., Disp: 90 tablet, Rfl: 0   hydrocortisone (CORTEF) 20 MG tablet, Take by mouth. (Patient not taking: Reported on 05/25/2022), Disp: , Rfl:    lidocaine (XYLOCAINE) 2 % solution, Use as directed 15 mLs in the mouth or throat every 4 (four) hours as needed., Disp: , Rfl:    lidocaine-prilocaine (EMLA) cream, Apply to affected area once (Patient not taking: Reported on 04/05/2022), Disp: 30 g, Rfl: 3   magic mouthwash SOLN, Swish and spit 5 mLs 3 (three) times daily. (Patient not taking: Reported on 06/10/2022), Disp: , Rfl:     ondansetron (ZOFRAN) 8 MG tablet, Take 1 tablet (8 mg total) by mouth 2 (two) times daily as needed. Start on the third day after cisplatin chemotherapy. (Patient not taking: Reported on 02/13/2022), Disp: 30 tablet, Rfl: 1   oxyCODONE (OXY IR/ROXICODONE) 5 MG immediate release tablet, Take 1 tablet (5 mg total) by mouth 2 (two) times daily., Disp: 20 tablet, Rfl: 0   prochlorperazine (COMPAZINE) 10 MG tablet, Take 1 tablet (10 mg total) by mouth every 6 (six) hours as needed (Nausea or vomiting). (Patient not taking: Reported on 05/25/2022), Disp: 30 tablet, Rfl: 1 No current facility-administered medications for this visit.  Facility-Administered Medications Ordered in Other Visits:    heparin lock flush 100 UNIT/ML injection, , , ,    heparin lock flush 100 UNIT/ML injection, , , ,    sodium chloride flush (NS) 0.9 % injection 10 mL, 10 mL, Intravenous, PRN, Sindy Guadeloupe, MD, 10 mL at 04/05/22 1112  Physical exam:  Vitals:   06/06/22 1450  BP: (!) 147/94  Pulse: 93  Resp: 16  Temp: 99.1 F (37.3 C)  TempSrc: Tympanic  Weight: 156 lb 9.6 oz (71 kg)  Height: 5' 8.5" (1.74 m)   Physical Exam Constitutional:      General: He is in acute distress.  HENT:     Mouth/Throat:     Mouth: Mucous membranes are moist.     Pharynx: Oropharynx is clear.     Comments: There is an ulcerated area at the base of the left oropharynx with purulent discharge over it Cardiovascular:     Rate and Rhythm: Normal rate and regular rhythm.     Heart sounds: Normal heart sounds.  Pulmonary:     Effort: Pulmonary effort is normal.     Breath sounds: Normal breath sounds.  Abdominal:     General: Bowel sounds are normal.     Palpations: Abdomen is soft.  Skin:    General: Skin is warm and dry.  Neurological:     Mental Status: He is alert and oriented to person, place, and time.         Latest Ref Rng & Units 06/10/2022    8:43 PM  CMP  Glucose 70 - 99 mg/dL 125   BUN 6 - 20 mg/dL 16    Creatinine 0.61 - 1.24 mg/dL 1.00   Sodium 135 -  145 mmol/L 136   Potassium 3.5 - 5.1 mmol/L 3.3   Chloride 98 - 111 mmol/L 102   CO2 22 - 32 mmol/L 25   Calcium 8.9 - 10.3 mg/dL 7.7   Total Protein 6.5 - 8.1 g/dL 5.7   Total Bilirubin 0.3 - 1.2 mg/dL 0.6   Alkaline Phos 38 - 126 U/L 53   AST 15 - 41 U/L 24   ALT 0 - 44 U/L 27       Latest Ref Rng & Units 06/10/2022    8:43 PM  CBC  WBC 4.0 - 10.5 K/uL 11.0   Hemoglobin 13.0 - 17.0 g/dL 10.0   Hematocrit 39.0 - 52.0 % 29.2   Platelets 150 - 400 K/uL 305     No images are attached to the encounter.  DG Chest Portable 1 View  Result Date: 06/10/2022 CLINICAL DATA:  Check OGT, ETT placement. Ventilator dependent respiratory failure. EXAM: PORTABLE CHEST 1 VIEW COMPARISON:  Portable chest earlier today at 8:56 p.m. FINDINGS: 11:34 p.m. Interval intubation, ETT tip 4.3 cm from carina. NGT passes well into the stomach with tip in the gastric antrum. A right chest port and IJ approach catheter again noted with catheter tip about the superior cavoatrial junction. The cardiomediastinal silhouette and vasculature are normal. The lungs are clear. The sulci are sharp. Thoracic cage unremarkable. IMPRESSION: Adequately inserted support tubes. No acute radiographic chest findings. Electronically Signed   By: Telford Nab M.D.   On: 06/10/2022 23:55   DG Chest 1 View  Result Date: 06/10/2022 CLINICAL DATA:  Aspiration EXAM: CHEST  1 VIEW COMPARISON:  CT chest dated 01/30/2022 FINDINGS: Lungs are clear.  No pleural effusion or pneumothorax. The heart is normal in size. Right chest port terminates in the upper right atrium. IMPRESSION: No evidence of acute cardiopulmonary disease. Electronically Signed   By: Julian Hy M.D.   On: 06/10/2022 21:12   CT SOFT TISSUE NECK W CONTRAST  Result Date: 06/08/2022 CLINICAL DATA:  Oropharyngeal squamous cell carcinoma status post chemoradiation finished in April 2022 EXAM: CT NECK WITH CONTRAST  TECHNIQUE: Multidetector CT imaging of the neck was performed using the standard protocol following the bolus administration of intravenous contrast. RADIATION DOSE REDUCTION: This exam was performed according to the departmental dose-optimization program which includes automated exposure control, adjustment of the mA and/or kV according to patient size and/or use of iterative reconstruction technique. CONTRAST:  41m OMNIPAQUE IOHEXOL 300 MG/ML  SOLN COMPARISON:  CTA neck 01/10/2022, CT neck 11/21/2021 FINDINGS: Pharynx and larynx: The nasal cavity and nasopharynx are unremarkable. There is asymmetric widening of the left glossotonsillar sulcus with heterogeneous material with apparent fluid and gas within the sulcus (2-47). There is no appreciable underlying abnormal mucosal enhancement or mass lesion. The oral cavity and oropharynx are otherwise unremarkable. The oral tongue is unremarkable. There is no abnormal enhancement in the tongue base. The hypopharynx and larynx are unremarkable. The vocal folds are normal in appearance. There is no retropharyngeal fluid collection. Salivary glands: The parotid glands are unremarkable. The submandibular glands are atrophic. Thyroid: Unremarkable. Lymph nodes: There is no pathologic lymphadenopathy in the neck. Vascular: A right chest wall port is in place. The tip is off the field of view. The major vasculature of the neck is otherwise unremarkable. Limited intracranial: The imaged portions of the intracranial compartment are unremarkable. Visualized orbits: The imaged globes and orbits are unremarkable. Mastoids and visualized paranasal sinuses: There is trace mucosal thickening in the right maxillary sinus.  The mastoid air cells are clear. Skeleton: There is no acute osseous abnormality or suspicious osseous lesion. Upper chest: There is scarring in the right lung apex. Other: There is mild fat stranding in the left neck anteriorly and in the submandibular regions,  likely evolving postradiation change. IMPRESSION: 1. Asymmetric widening of the left glossotonsillar sulcus with heterogeneous material including fluid and gas within the sulcus favored to reflect retained secretions/debris. Recommend correlation with direct visualization. No abnormal mucosal enhancement or mass lesion identified. 2. No pathologic lymphadenopathy in the neck. Electronically Signed   By: Valetta Mole M.D.   On: 06/08/2022 09:30     Assessment and plan- Patient is a 51 y.o. 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  He is s/p 5 cycles of weekly cisplatin chemotherapy with concurrent radiation treatment.  Chemo was stopped prematurely due to worsening mucositis and patient completed radiation treatment in April 2022.  He now has noted blood and biopsy-proven locally recurrent disease.  He completed second round of concurrent chemoradiation in June 2023  Given his uncontrolled pain I am concerned that he still has residual disease there.  Unfortunately his insurance will not approve PET scan unless it has been 3 months from his end of radiation which would be sometime in mid September 2023.  I will therefore proceed with a CT soft tissue neck at this time.  I also reached out to Dr. Tami Ribas who is his ENT to see if there would be any role for intervention at his end.  Dr. Tami Ribas is concerned that this lesion has a particular increased risk of bleeding given its location.  He does not think surgical intervention would be a possibility but he will refer the patient to [facility] in the meanwhile.  Neoplasm related pain: Patient has a history of severe depression and suicidal ideation in the past.  Recently patient was incarcerated as well and admits to using marijuana.  However he does appear to be in significant pain and Tylenol and NSAIDs as well as gabapentin is not controlling his pain.  I explained to the patient clearly that I am willing  to prescribe him limited amount of opioids but he needs to take it only as prescribed.  I will not be able to prescribe any opioids ahead of time.  I will be checking his baseline urine for drugs and if there is any evidence of cocaine or marijuana in his subsequent urine drug test I will not be able to prescribe him continued opioids.  I am sending him prescription for oxycodone 10 mg twice daily for 10 days and I will reassess him in 10 days time   Visit Diagnosis 1. Squamous cell carcinoma of oropharynx ([place])   2. Neoplasm related pain      Dr. Randa Evens, MD, MPH [facility] at [facility] [phone] 06/19/2022 2:31 PM

## 2022-06-20 ENCOUNTER — Inpatient Hospital Stay: Payer: Medicaid Other | Admitting: Oncology

## 2022-06-20 ENCOUNTER — Inpatient Hospital Stay: Payer: Medicaid Other

## 2022-06-26 ENCOUNTER — Inpatient Hospital Stay: Payer: Medicaid Other

## 2022-06-26 ENCOUNTER — Encounter: Payer: Self-pay | Admitting: Oncology

## 2022-06-26 ENCOUNTER — Other Ambulatory Visit: Payer: Self-pay | Admitting: *Deleted

## 2022-06-26 ENCOUNTER — Other Ambulatory Visit: Payer: Self-pay

## 2022-06-26 ENCOUNTER — Inpatient Hospital Stay (HOSPITAL_BASED_OUTPATIENT_CLINIC_OR_DEPARTMENT_OTHER): Payer: Medicaid Other | Admitting: Oncology

## 2022-06-26 ENCOUNTER — Telehealth: Payer: Self-pay | Admitting: *Deleted

## 2022-06-26 VITALS — BP 131/108 | HR 98 | Temp 98.3°F | Resp 16 | Ht 68.0 in | Wt 146.0 lb

## 2022-06-26 DIAGNOSIS — G893 Neoplasm related pain (acute) (chronic): Secondary | ICD-10-CM | POA: Diagnosis not present

## 2022-06-26 DIAGNOSIS — Z0283 Encounter for blood-alcohol and blood-drug test: Secondary | ICD-10-CM

## 2022-06-26 DIAGNOSIS — K1230 Oral mucositis (ulcerative), unspecified: Secondary | ICD-10-CM | POA: Diagnosis not present

## 2022-06-26 DIAGNOSIS — C109 Malignant neoplasm of oropharynx, unspecified: Secondary | ICD-10-CM | POA: Diagnosis not present

## 2022-06-26 DIAGNOSIS — C01 Malignant neoplasm of base of tongue: Secondary | ICD-10-CM | POA: Diagnosis not present

## 2022-06-26 DIAGNOSIS — Z79899 Other long term (current) drug therapy: Secondary | ICD-10-CM | POA: Diagnosis not present

## 2022-06-26 DIAGNOSIS — F1490 Cocaine use, unspecified, uncomplicated: Secondary | ICD-10-CM | POA: Diagnosis not present

## 2022-06-26 DIAGNOSIS — Z79891 Long term (current) use of opiate analgesic: Secondary | ICD-10-CM | POA: Diagnosis not present

## 2022-06-26 DIAGNOSIS — R45851 Suicidal ideations: Secondary | ICD-10-CM | POA: Diagnosis not present

## 2022-06-26 LAB — URINE DRUG SCREEN, QUALITATIVE (ARMC ONLY)
Amphetamines, Ur Screen: NOT DETECTED
Barbiturates, Ur Screen: POSITIVE — AB
Benzodiazepine, Ur Scrn: NOT DETECTED
Cannabinoid 50 Ng, Ur ~~LOC~~: POSITIVE — AB
Cocaine Metabolite,Ur ~~LOC~~: NOT DETECTED
MDMA (Ecstasy)Ur Screen: NOT DETECTED
Methadone Scn, Ur: NOT DETECTED
Opiate, Ur Screen: NOT DETECTED
Phencyclidine (PCP) Ur S: NOT DETECTED
Tricyclic, Ur Screen: NOT DETECTED

## 2022-06-26 MED ORDER — OXYCODONE HCL 5 MG PO TABS: 5.0000 mg | ORAL_TABLET | Freq: Two times a day (BID) | ORAL | 0 refills | Status: DC

## 2022-06-26 MED ORDER — AMOXICILLIN-POT CLAVULANATE 875-125 MG PO TABS
1.0000 | ORAL_TABLET | Freq: Two times a day (BID) | ORAL | 0 refills | Status: DC
Start: 2022-06-26 — End: 2022-08-14

## 2022-06-26 MED ORDER — GABAPENTIN 600 MG PO TABS: 600.0000 mg | ORAL_TABLET | Freq: Three times a day (TID) | ORAL | 0 refills | Status: DC

## 2022-06-26 NOTE — Telephone Encounter (Signed)
He will need to wait until tomorrow to pick up the med. When did he pick up his oxycodone from [facility]?  He received 30 tablets on 9/7 so he should not have taken more than prescribed. He should take gabapentin 600 mg TID.  Paul Hebert- please look into byrd fund versus [employer] paying for it

## 2022-06-26 NOTE — Telephone Encounter (Addendum)
I received back to back calls form patient s/o then from the Pharmacy at [facility]. Paul Hebert states that pharmacy will not fill his Oxycodone until tomorrow and she is staing that he is hurting and needs this medicine today. Pharmacist states his insurance will not cover Oxy 5 mg due to the fact that he got a 5 day prescription of Oxycodone 10 mg from [facility] 9/7. Also states that patient thinks the [employer] is to pay for this prescription, but he now has [employer] so she is not sure what to do about [employer] request. Pharmacist also has question regarding his Gabapentin stating that they received prescription for 600 mg and 300 mg and is asking if he is to take both. Please return call to [facility] [phone] and also let Paul Hebert know outcome of this matter

## 2022-06-26 NOTE — Progress Notes (Signed)
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 Patient states that he presented with these complaints 6 months ago and had a CT soft tissue neck which did not show any malignancy.  He continued to have on and off pain which was gradually getting worse and he presented back to the ER on 09/27/2020.     CT soft tissue neck showed a left base of tongue mass measuring 3.4 x 2.6 x 3.3 cm which was invading the tongue along the anterior and medial margin and extending into the left vallecula.  Possible superior and posterior extension to involve the left palatine tonsil the lesion abuts the uvula with early extension into the soft palate.  Multiple left level 2 lymph nodes largest of which was 1.2 cm.  Additional necrotic limited to level 3 nodal conglomerate measures 1.3 cm.  No right-sided adenopathy.   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  PET CT scan Showed left base of tongue mass measuring about 2.8 cm with enlarged centrally necrotic level 2/3 left-sided and lymph nodes with an SUV about 3.7.  No hypermetabolic right neck nodes.  No findings of distant metastatic disease   Patient completed 5 cycles of weekly cisplatin chemotherapy between January to March 2022.  There were treatment interruptions due to  mucositis.  He could not complete 7 cycles eventually.  He did complete his radiation treatment.   Towards the end of radiation patient was admitted for suicidal ideation.  At the end of chemoradiation patient did not have any evidence of residual disease.  However he was found to have biopsy-proven local recurrence in March 2023.     Patient completed second round of chemoradiation in late June 2023    Interval history-patient went to the ER for symptoms of hematemesis after he had a massive amount of bleeding from a bleeding vessel close to his tumor.  He was intubated and transferred to [facility].  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  He had a repeat back [facility] on 06/16/2022 which did not show any evidence of malignancy.  He was sent home on 5 days of pain medications.  He continues to endorse significant pain at the base of his left tongue which makes it difficult for him to swallow any solid food.  He is mainly sustaining himself on a liquid diet.  States that even a chicken noodle soup makes him hurt.  ECOG PS- 2 Pain scale- 10 Opioid associated constipation- no  Review of systems- Review of Systems  Constitutional:  Positive for malaise/fatigue and weight loss.  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)      Past Surgical History:  Procedure Laterality Date   LARYNGOSCOPY Left 01/24/2022  Procedure: DIRECT LARYNGOSCOPY WITH TONSIL BIOPSY;  Surgeon: Beverly Gust, MD;  Location: [facility] ORS;  Service: ENT;  Laterality: Left;   PORT-A-CATH REMOVAL N/A 02/04/2021   Procedure: REMOVAL PORT-A-CATH;  Surgeon: Olean Ree, MD;  Location: [facility] ORS;  Service: General;  Laterality: N/A;   PORTA CATH INSERTION N/A 10/25/2020   Procedure: PORTA CATH INSERTION;  Surgeon: Algernon Huxley, MD;  Location: [place] [facility] LAB;  Service: Cardiovascular;  Laterality: N/A;   PORTA CATH  INSERTION N/A 02/09/2022   Procedure: PORTA CATH INSERTION;  Surgeon: Algernon Huxley, MD;  Location: [place] [facility];  Service: Cardiovascular;  Laterality: N/A;   REMOVAL OF GASTROSTOMY TUBE N/A 02/04/2021   Procedure: REMOVAL OF GASTROSTOMY TUBE;  Surgeon: Olean Ree, MD;  Location: [facility] ORS;  Service: General;  Laterality: N/A;   TONGUE BIOPSY Left 01/24/2022   Procedure: TONGUE BIOPSY;  Surgeon: Beverly Gust, MD;  Location: [facility] ORS;  Service: ENT;  Laterality: Left;    Social History   Socioeconomic History   Marital status: Single    Spouse name: Not on file   Number of children: Not on file   Years of education: Not on file   Highest education level: Not on file  Occupational History   Not on file  Tobacco Use   Smoking status: Some Days    Packs/day: 0.50    Years: 35.00    Total pack years: 17.50    Types: Cigarettes   Smokeless tobacco: Current   Tobacco comments:    Patches very rarely  Vaping Use   Vaping Use: Former  Substance and Sexual Activity   Alcohol use: Not Currently   Drug use: Not Currently    Types: Marijuana, "Crack" cocaine, Methamphetamines    Comment: marijuana every day except the joint   Sexual activity: Not Currently  Other Topics Concern   Not on file  Social History Narrative   Not on file   Social Determinants of Health   Financial Resource Strain: Not on file  Food Insecurity: Not on file  Transportation Needs: Not on file  Physical Activity: Not on file  Stress: Not on file  Social Connections: Not on file  Intimate Partner Violence: Not on file    Family History  Problem Relation Age of Onset   Cancer Father      Current Outpatient Medications:    DULoxetine (CYMBALTA) 60 MG capsule, Take 1 capsule (60 mg total) by mouth daily., Disp: 30 capsule, Rfl: 3   lansoprazole (PREVACID) 30 MG capsule, Take 1 capsule (30 mg total) by mouth daily at 12 noon., Disp: 30 capsule, Rfl: 3   sucralfate (CARAFATE) 1 g tablet, Take  1 tablet (1 g total) by mouth 3 (three) times daily before meals., Disp: 90 tablet, Rfl: 6   amoxicillin-clavulanate (AUGMENTIN) 875-125 MG tablet, Take 1 tablet by mouth 2 (two) times daily., Disp: 14 tablet, Rfl: 0   calcium carbonate (TUMS - DOSED IN MG ELEMENTAL CALCIUM) 500 MG chewable tablet, Chew 1 tablet by mouth as needed for indigestion or heartburn. (Patient not taking: Reported on 06/26/2022), Disp: , Rfl:    gabapentin (NEURONTIN) 600 MG tablet, Take 1 tablet (600 mg total) by mouth 3 (three) times daily., Disp: 90 tablet, Rfl: 0   hydrocortisone (CORTEF) 20 MG tablet, Take by mouth. (Patient not taking: Reported on 05/25/2022), Disp: , Rfl:    lidocaine (XYLOCAINE) 2 % solution, Use as directed 15 mLs in the mouth or throat every 4 (four)  hours as needed. (Patient not taking: Reported on 06/26/2022), Disp: , Rfl:    lidocaine-prilocaine (EMLA) cream, Apply to affected area once (Patient not taking: Reported on 04/05/2022), Disp: 30 g, Rfl: 3   magic mouthwash SOLN, Swish and spit 5 mLs 3 (three) times daily. (Patient not taking: Reported on 06/10/2022), Disp: , Rfl:    magic mouthwash w/lidocaine SOLN, Take 5 mLs by mouth 4 (four) times daily. (Patient not taking: Reported on 06/26/2022), Disp: 210 mL, Rfl: 0   meloxicam (MOBIC) 15 MG tablet, Take 1 tablet (15 mg total) by mouth daily. (Patient not taking: Reported on 06/26/2022), Disp: 30 tablet, Rfl: 0   ondansetron (ZOFRAN) 8 MG tablet, Take 1 tablet (8 mg total) by mouth 2 (two) times daily as needed. Start on the third day after cisplatin chemotherapy. (Patient not taking: Reported on 02/13/2022), Disp: 30 tablet, Rfl: 1   oxyCODONE (OXY IR/ROXICODONE) 5 MG immediate release tablet, Take 1 tablet (5 mg total) by mouth 2 (two) times daily., Disp: 28 tablet, Rfl: 0   prochlorperazine (COMPAZINE) 10 MG tablet, Take 1 tablet (10 mg total) by mouth every 6 (six) hours as needed (Nausea or vomiting). (Patient not taking: Reported on 05/25/2022), Disp:  30 tablet, Rfl: 1 No current facility-administered medications for this visit.  Facility-Administered Medications Ordered in Other Visits:    heparin lock flush 100 UNIT/ML injection, , , ,    heparin lock flush 100 UNIT/ML injection, , , ,    sodium chloride flush (NS) 0.9 % injection 10 mL, 10 mL, Intravenous, PRN, Sindy Guadeloupe, MD, 10 mL at 04/05/22 1112  Physical exam:  Vitals:   06/26/22 0921  BP: (!) 131/108  Pulse: 98  Resp: 16  Temp: 98.3 F (36.8 C)  TempSrc: Tympanic  Weight: 146 lb (66.2 kg)  Height: '5\' 8"'$  (1.727 m)   Physical Exam Constitutional:      Comments: Appears to be in distress.  Sitting in a wheelchair  HENT:     Mouth/Throat:     Mouth: Mucous membranes are moist.     Pharynx: Oropharynx is clear.     Comments: There is an ulcerated area on the left side noted in the oropharynx.  Mildly enlarged level 2 cervical lymph node Cardiovascular:     Rate and Rhythm: Normal rate and regular rhythm.     Heart sounds: Normal heart sounds.  Pulmonary:     Effort: Pulmonary effort is normal.     Breath sounds: Normal breath sounds.  Abdominal:     General: Bowel sounds are normal.     Palpations: Abdomen is soft.  Skin:    General: Skin is warm and dry.  Neurological:     Mental Status: He is alert and oriented to person, place, and time.         Latest Ref Rng & Units 06/10/2022    8:43 PM  CMP  Glucose 70 - 99 mg/dL 125   BUN 6 - 20 mg/dL 16   Creatinine 0.61 - 1.24 mg/dL 1.00   Sodium 135 - 145 mmol/L 136   Potassium 3.5 - 5.1 mmol/L 3.3   Chloride 98 - 111 mmol/L 102   CO2 22 - 32 mmol/L 25   Calcium 8.9 - 10.3 mg/dL 7.7   Total Protein 6.5 - 8.1 g/dL 5.7   Total Bilirubin 0.3 - 1.2 mg/dL 0.6   Alkaline Phos 38 - 126 U/L 53   AST 15 - 41 U/L 24   ALT  0 - 44 U/L 27       Latest Ref Rng & Units 06/10/2022    8:43 PM  CBC  WBC 4.0 - 10.5 K/uL 11.0   Hemoglobin 13.0 - 17.0 g/dL 10.0   Hematocrit 39.0 - 52.0 % 29.2   Platelets 150 - 400  K/uL 305     No images are attached to the encounter.  DG Chest Portable 1 View  Result Date: 06/10/2022 CLINICAL DATA:  Check OGT, ETT placement. Ventilator dependent respiratory failure. EXAM: PORTABLE CHEST 1 VIEW COMPARISON:  Portable chest earlier today at 8:56 p.m. FINDINGS: 11:34 p.m. Interval intubation, ETT tip 4.3 cm from carina. NGT passes well into the stomach with tip in the gastric antrum. A right chest port and IJ approach catheter again noted with catheter tip about the superior cavoatrial junction. The cardiomediastinal silhouette and vasculature are normal. The lungs are clear. The sulci are sharp. Thoracic cage unremarkable. IMPRESSION: Adequately inserted support tubes. No acute radiographic chest findings. Electronically Signed   By: Telford Nab M.D.   On: 06/10/2022 23:55   DG Chest 1 View  Result Date: 06/10/2022 CLINICAL DATA:  Aspiration EXAM: CHEST  1 VIEW COMPARISON:  CT chest dated 01/30/2022 FINDINGS: Lungs are clear.  No pleural effusion or pneumothorax. The heart is normal in size. Right chest port terminates in the upper right atrium. IMPRESSION: No evidence of acute cardiopulmonary disease. Electronically Signed   By: Julian Hy M.D.   On: 06/10/2022 21:12   CT SOFT TISSUE NECK W CONTRAST  Result Date: 06/08/2022 CLINICAL DATA:  Oropharyngeal squamous cell carcinoma status post chemoradiation finished in April 2022 EXAM: CT NECK WITH CONTRAST TECHNIQUE: Multidetector CT imaging of the neck was performed using the standard protocol following the bolus administration of intravenous contrast. RADIATION DOSE REDUCTION: This exam was performed according to the departmental dose-optimization program which includes automated exposure control, adjustment of the mA and/or kV according to patient size and/or use of iterative reconstruction technique. CONTRAST:  63m OMNIPAQUE IOHEXOL 300 MG/ML  SOLN COMPARISON:  CTA neck 01/10/2022, CT neck 11/21/2021 FINDINGS: Pharynx  and larynx: The nasal cavity and nasopharynx are unremarkable. There is asymmetric widening of the left glossotonsillar sulcus with heterogeneous material with apparent fluid and gas within the sulcus (2-47). There is no appreciable underlying abnormal mucosal enhancement or mass lesion. The oral cavity and oropharynx are otherwise unremarkable. The oral tongue is unremarkable. There is no abnormal enhancement in the tongue base. The hypopharynx and larynx are unremarkable. The vocal folds are normal in appearance. There is no retropharyngeal fluid collection. Salivary glands: The parotid glands are unremarkable. The submandibular glands are atrophic. Thyroid: Unremarkable. Lymph nodes: There is no pathologic lymphadenopathy in the neck. Vascular: A right chest wall port is in place. The tip is off the field of view. The major vasculature of the neck is otherwise unremarkable. Limited intracranial: The imaged portions of the intracranial compartment are unremarkable. Visualized orbits: The imaged globes and orbits are unremarkable. Mastoids and visualized paranasal sinuses: There is trace mucosal thickening in the right maxillary sinus. The mastoid air cells are clear. Skeleton: There is no acute osseous abnormality or suspicious osseous lesion. Upper chest: There is scarring in the right lung apex. Other: There is mild fat stranding in the left neck anteriorly and in the submandibular regions, likely evolving postradiation change. IMPRESSION: 1. Asymmetric widening of the left glossotonsillar sulcus with heterogeneous material including fluid and gas within the sulcus favored to reflect retained secretions/debris. Recommend  correlation with direct visualization. No abnormal mucosal enhancement or mass lesion identified. 2. No pathologic lymphadenopathy in the neck. Electronically Signed   By: Valetta Mole M.D.   On: 06/08/2022 09:30     Assessment and plan- Patient is a 51 y.o. male  with stage I HPV positive  squamous cell carcinoma of the oropharynx/left base of tongue T2 N1 M0.  He is s/p 5 cycles of weekly cisplatin chemotherapy with concurrent radiation treatment.  Chemo was stopped prematurely due to worsening mucositis and patient completed radiation treatment in April 2022.  He now has noted to have  biopsy-proven locally recurrent disease.  He completed second round of concurrent chemoradiation in June 2023  Repeat biopsy of the oropharynx did not show any evidence of malignancy.  It is unclear if this was inaccurate sampling versus radiation necrosis at that site.  I will plan to get a PET scan at this time over the next 2 weeks since it would be 3 months from the time he has completed radiation.  Until last week and a certain if it dealing with recurrent disease or not further treatment options cannot be discussed.  If there is evidence of hypermetabolic on PET scan I will refer him to [facility] Dr. Frazier Butt to see if he would be able to biopsy that area again and if he would be a surgical candidate in the future.  If he has biopsy-proven disease and not deemed to be a surgical candidate we will have to consider systemic chemotherapy.  Neoplasm related pain: Patient was discharged on oxycodone 10 mg every 4 hours as needed for 5 days.  Starting him back on oxycodone 10 mg however twice a day and not 6 times a day until I can get results from his repeat urine toxicology screen back.  If he has any evidence of cocaine use I will not be able to continue to give him pain medications and I have explained to the patient clearly.  However if patient continues to abstain from cocaine use I will consider titrating his pain medications upwards.  He will also remain on gabapentin and Cymbalta.  We discussed his nutrition moving forward since he is mainly sustaining himself on Ensure and liquid diet.  He is willing to consider feeding tube in the future should his weight dropped to less than 130 pounds.  I will see  him back in 2 weeks after PET scan results are back and to further manage his pain medications   Visit Diagnosis 1. Squamous cell carcinoma of oropharynx ([place])   2. Neoplasm related pain   3. Encounter for drug screening      Dr. Randa Evens, MD, MPH [facility] at [facility] [phone] 06/26/2022 11:15 AM

## 2022-06-26 NOTE — Telephone Encounter (Signed)
I called pharmacy at 1220 to inform of Gabapentin dose per Dr Janese Banks of 600 mg three times a day and that ok to nit fill Oxy 5 mg until tomorrow. I also told them that her nurse Judeen Hammans will let them know about the [facility] request

## 2022-06-27 ENCOUNTER — Telehealth: Payer: Self-pay | Admitting: *Deleted

## 2022-06-27 ENCOUNTER — Other Ambulatory Visit: Payer: Medicaid Other

## 2022-06-27 ENCOUNTER — Other Ambulatory Visit: Payer: Self-pay | Admitting: *Deleted

## 2022-06-27 MED ORDER — MELOXICAM 15 MG PO TABS: 15.0000 mg | ORAL_TABLET | Freq: Every day | ORAL | 0 refills | Status: DC

## 2022-06-27 NOTE — Telephone Encounter (Signed)
Incoming fax from [facility]. Pt is requesting RF on mobic

## 2022-06-27 NOTE — Telephone Encounter (Signed)
We got a phone call about [place] caps needs 16 dollars to get his meds. I checked with Gabriel Cirri and she gave him a gift card last Friday and he can't get another til this Friday. I wanted to see if pt as help on byrd fund and [facility] sent a message that he is a person on byrd  fund so I called the pharmacy and spoke to [place] and she put that pt can use this for meds from cancer center. She made a note in his chart for the future.

## 2022-06-30 LAB — MISC LABCORP TEST (SEND OUT): Labcorp test code: 791194

## 2022-07-04 ENCOUNTER — Telehealth: Payer: Self-pay | Admitting: Oncology

## 2022-07-04 NOTE — Telephone Encounter (Signed)
Patient came in looking to get Gas card. Patient was made aware that he didn't qualify for [employer] or gas card anymore, due to not being under tx.

## 2022-07-05 ENCOUNTER — Telehealth: Payer: Self-pay | Admitting: *Deleted

## 2022-07-05 ENCOUNTER — Other Ambulatory Visit: Payer: Self-pay | Admitting: *Deleted

## 2022-07-05 MED ORDER — OXYCODONE HCL 10 MG PO TABS: 10.0000 mg | ORAL_TABLET | Freq: Three times a day (TID) | ORAL | 0 refills | Status: DC

## 2022-07-05 NOTE — Telephone Encounter (Addendum)
Call from [place] reporting that patient was on her phone reporting that he is unable to reach our office and is complaining of pain 10/10 and that over the counter medications are not controlling his pain. He is asking for pain management. He states his tongue and throat hurt and that he has a migraine. Please note that this patient is known to not follow directions for use of his narcotics. He last got #28 Oxycodone 5 mg tabs that he picked up on 9/12 from [facility] to take 1 tablet twice a day. Please advise   Of note, the patient is out of his oxycodone again that should have lasted him until 9/26 He is angry that he was curt back to 2 tabs 5 mg tabs a day from 6 of the 10 mg tabs he got from [facility],. He yelled at me stating "What was I supposed to do?" when told that he has taken more medicine than prescribed. I answered that he should have called [place] to let us know his pain was not controlled

## 2022-07-05 NOTE — Telephone Encounter (Signed)
Defer pt's concerns to Dr. Janese Banks per Ander Purpura, NP

## 2022-07-11 ENCOUNTER — Ambulatory Visit
Admission: RE | Admit: 2022-07-11 | Discharge: 2022-07-11 | Disposition: A | Discharge status: Home / Self Care | Payer: Medicaid Other | Source: Ambulatory Visit | Attending: Oncology | Admitting: Oncology

## 2022-07-11 DIAGNOSIS — R918 Other nonspecific abnormal finding of lung field: Secondary | ICD-10-CM | POA: Diagnosis not present

## 2022-07-11 DIAGNOSIS — C109 Malignant neoplasm of oropharynx, unspecified: Secondary | ICD-10-CM | POA: Insufficient documentation

## 2022-07-11 DIAGNOSIS — Z9221 Personal history of antineoplastic chemotherapy: Secondary | ICD-10-CM | POA: Diagnosis not present

## 2022-07-11 DIAGNOSIS — Z930 Tracheostomy status: Secondary | ICD-10-CM | POA: Diagnosis not present

## 2022-07-11 DIAGNOSIS — Z923 Personal history of irradiation: Secondary | ICD-10-CM | POA: Insufficient documentation

## 2022-07-11 LAB — GLUCOSE, CAPILLARY: Glucose-Capillary: 109 mg/dL — ABNORMAL HIGH (ref 70–99)

## 2022-07-11 MED ORDER — FLUDEOXYGLUCOSE F - 18 (FDG) INJECTION
7.1000 | Freq: Once | INTRAVENOUS | Status: AC | PRN
  Administered 2022-07-11: 7.59 via INTRAVENOUS

## 2022-07-14 ENCOUNTER — Other Ambulatory Visit: Payer: Self-pay | Admitting: Oncology

## 2022-07-14 ENCOUNTER — Encounter: Payer: Self-pay | Admitting: Oncology

## 2022-07-14 ENCOUNTER — Inpatient Hospital Stay (HOSPITAL_BASED_OUTPATIENT_CLINIC_OR_DEPARTMENT_OTHER): Payer: Medicaid Other | Admitting: Oncology

## 2022-07-14 VITALS — BP 150/97 | HR 109 | Temp 98.9°F | Resp 16 | Wt 137.4 lb

## 2022-07-14 DIAGNOSIS — C01 Malignant neoplasm of base of tongue: Secondary | ICD-10-CM | POA: Diagnosis not present

## 2022-07-14 DIAGNOSIS — G893 Neoplasm related pain (acute) (chronic): Secondary | ICD-10-CM

## 2022-07-14 DIAGNOSIS — C109 Malignant neoplasm of oropharynx, unspecified: Secondary | ICD-10-CM | POA: Diagnosis not present

## 2022-07-14 MED ORDER — PREDNISONE 10 MG PO TABS: 10.0000 mg | ORAL_TABLET | Freq: Every day | ORAL | 0 refills | Status: DC

## 2022-07-14 NOTE — Progress Notes (Unsigned)
Pt c/o tongue swelling since his biopsy about amonth ago.  States he is not able to eat; barely talk and states his pain feels like sharps are going through his eye and ear drum. Pt seems very uncomfortable and states the antibiotics did not help at all; the past three days has ate half a cup of soup, couple of bites of rice, and seven ensure. Lays in bed all day due to pain. Pt believes a nerve was hit when biopsy was done; oxycodone is no longer helping the pain; headache is constant.

## 2022-07-14 NOTE — Progress Notes (Unsigned)
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 Patient states that he presented with these complaints 6 months ago and had a CT soft tissue neck which did not show any malignancy.  He continued to have on and off pain which was gradually getting worse and he presented back to the ER on 09/27/2020.     CT soft tissue neck showed a left base of tongue mass measuring 3.4 x 2.6 x 3.3 cm which was invading the tongue along the anterior and medial margin and extending into the left vallecula.  Possible superior and posterior extension to involve the left palatine tonsil the lesion abuts the uvula with early extension into the soft palate.  Multiple left level 2 lymph nodes largest of which was 1.2 cm.  Additional necrotic limited to level 3 nodal conglomerate measures 1.3 cm.  No right-sided adenopathy.   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  PET CT scan Showed left base of tongue mass measuring about 2.8 cm with enlarged centrally necrotic level 2/3 left-sided and lymph nodes with an SUV about 3.7.  No hypermetabolic right neck nodes.  No findings of distant metastatic disease   Patient completed 5 cycles of weekly cisplatin chemotherapy between January to March 2022.  There were treatment  interruptions due to mucositis.  He could not complete 7 cycles eventually.  He did complete his radiation treatment.   Towards the end of radiation patient was admitted for suicidal ideation.  At the end of chemoradiation patient did not have any evidence of residual disease.  However he was found to have biopsy-proven local recurrence in March 2023.     Patient completed second round of chemoradiation in late June 2023    Interval history-patient continues to complain of significant pain in his left oropharynx which is making it difficult for him to eat any solid food.His weight is down from 156 pounds last month to 137 pounds presently.  ECOG PS- 1 Pain scale- 10 Opioid associated constipation- no  Review of systems- Review of Systems  Constitutional:  Negative for chills, fever, malaise/fatigue and weight loss.  HENT:  Negative for congestion, ear discharge and nosebleeds.        Base of tongue pain  Eyes:  Negative for blurred vision.  Respiratory:  Positive for stridor. Negative for cough, hemoptysis, sputum production, shortness of breath and wheezing.   Cardiovascular:  Negative for chest pain, palpitations, orthopnea and claudication.  Gastrointestinal:  Negative for abdominal pain, blood in stool, constipation, diarrhea, heartburn, melena, nausea and vomiting.  Genitourinary:  Negative for dysuria, flank pain, frequency, hematuria and urgency.  Musculoskeletal:  Negative for back pain, joint pain and myalgias.  Skin:  Negative for rash.  Neurological:  Negative for dizziness, tingling, focal weakness, seizures, weakness and headaches.  Endo/Heme/Allergies:  Does not bruise/bleed easily.  Psychiatric/Behavioral:  Negative for depression and suicidal ideas.  The patient does not have insomnia.       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[facility])      Past Surgical History:  Procedure Laterality  Date   LARYNGOSCOPY Left 01/24/2022   Procedure: DIRECT LARYNGOSCOPY WITH TONSIL BIOPSY;  Surgeon: Beverly Gust, MD;  Location: [facility] ORS;  Service: ENT;  Laterality: Left;   PORT-A-CATH REMOVAL N/A 02/04/2021   Procedure: REMOVAL PORT-A-CATH;  Surgeon: Olean Ree, MD;  Location: [facility] ORS;  Service: General;  Laterality: N/A;   PORTA CATH INSERTION N/A 10/25/2020   Procedure: PORTA CATH INSERTION;  Surgeon: Algernon Huxley, MD;  Location: [place] CV LAB;  Service: Cardiovascular;  Laterality: N/A;   PORTA CATH INSERTION N/A 02/09/2022   Procedure: PORTA CATH INSERTION;  Surgeon: Algernon Huxley, MD;  Location: [place] CV LAB;  Service: Cardiovascular;  Laterality: N/A;   REMOVAL OF GASTROSTOMY TUBE N/A 02/04/2021   Procedure: REMOVAL OF GASTROSTOMY TUBE;  Surgeon: Olean Ree, MD;  Location: [facility] ORS;  Service: General;  Laterality: N/A;   TONGUE BIOPSY Left 01/24/2022   Procedure: TONGUE BIOPSY;  Surgeon: Beverly Gust, MD;  Location: [facility] ORS;  Service: ENT;  Laterality: Left;    Social History   Socioeconomic History   Marital status: Single    Spouse name: Not on file   Number of children: Not on file   Years of education: Not on file   Highest education level: Not on file  Occupational History   Not on file  Tobacco Use   Smoking status: Some Days    Packs/day: 0.50    Years: 35.00    Total pack years: 17.50    Types: Cigarettes   Smokeless tobacco: Current   Tobacco comments:    Patches very rarely  Vaping Use   Vaping Use: Former  Substance and Sexual Activity   Alcohol use: Not Currently   Drug use: Not Currently    Types: Marijuana, "Crack" cocaine, Methamphetamines    Comment: marijuana every day except the joint   Sexual activity: Not Currently  Other Topics Concern   Not on file  Social History Narrative   Not on file   Social Determinants of Health   Financial Resource Strain: Not on file  Food Insecurity: Not on file  Transportation Needs:  Not on file  Physical Activity: Not on file  Stress: Not on file  Social Connections: Not on file  Intimate Partner Violence: Not on file    Family History  Problem Relation Age of Onset   Cancer Father      Current Outpatient Medications:    gabapentin (NEURONTIN) 600 MG tablet, TAKE 1 TABLET BY MOUTH THREE TIMES DAILY, Disp: 90 tablet, Rfl: 0   predniSONE (DELTASONE) 10 MG tablet, Take 1 tablet (10 mg total) by mouth daily with breakfast., Disp: 14 tablet, Rfl: 0   amoxicillin-clavulanate (AUGMENTIN) 875-125 MG tablet, Take 1 tablet by mouth 2 (two) times daily., Disp: 14 tablet, Rfl: 0   calcium carbonate (TUMS - DOSED IN MG ELEMENTAL CALCIUM) 500 MG chewable tablet, Chew 1 tablet by mouth as needed for indigestion or heartburn. (Patient not taking: Reported on 06/26/2022), Disp: , Rfl:    DULoxetine (CYMBALTA) 60 MG capsule, Take 1 capsule (60 mg total) by mouth daily., Disp: 30 capsule, Rfl: 3   hydrocortisone (CORTEF) 20 MG tablet, Take by mouth. (Patient not taking: Reported on 05/25/2022), Disp: ,  Rfl:    lansoprazole (PREVACID) 30 MG capsule, Take 1 capsule (30 mg total) by mouth daily at 12 noon., Disp: 30 capsule, Rfl: 3   lidocaine (XYLOCAINE) 2 % solution, Use as directed 15 mLs in the mouth or throat every 4 (four) hours as needed. (Patient not taking: Reported on 06/26/2022), Disp: , Rfl:    lidocaine-prilocaine (EMLA) cream, Apply to affected area once (Patient not taking: Reported on 04/05/2022), Disp: 30 g, Rfl: 3   magic mouthwash SOLN, Swish and spit 5 mLs 3 (three) times daily. (Patient not taking: Reported on 06/10/2022), Disp: , Rfl:    magic mouthwash w/lidocaine SOLN, Take 5 mLs by mouth 4 (four) times daily. (Patient not taking: Reported on 06/26/2022), Disp: 210 mL, Rfl: 0   meloxicam (MOBIC) 15 MG tablet, Take 1 tablet (15 mg total) by mouth daily., Disp: 30 tablet, Rfl: 0   ondansetron (ZOFRAN) 8 MG tablet, Take 1 tablet (8 mg total) by mouth 2 (two) times daily as  needed. Start on the third day after cisplatin chemotherapy. (Patient not taking: Reported on 02/13/2022), Disp: 30 tablet, Rfl: 1   Oxycodone HCl 10 MG TABS, Take 1 tablet (10 mg total) by mouth 3 (three) times daily., Disp: 52 tablet, Rfl: 0   prochlorperazine (COMPAZINE) 10 MG tablet, Take 1 tablet (10 mg total) by mouth every 6 (six) hours as needed (Nausea or vomiting). (Patient not taking: Reported on 05/25/2022), Disp: 30 tablet, Rfl: 1   sucralfate (CARAFATE) 1 g tablet, Take 1 tablet (1 g total) by mouth 3 (three) times daily before meals., Disp: 90 tablet, Rfl: 6 No current facility-administered medications for this visit.  Facility-Administered Medications Ordered in Other Visits:    heparin lock flush 100 UNIT/ML injection, , , ,    heparin lock flush 100 UNIT/ML injection, , , ,    sodium chloride flush (NS) 0.9 % injection 10 mL, 10 mL, Intravenous, PRN, Sindy Guadeloupe, MD, 10 mL at 04/05/22 1112  Physical exam:  Vitals:   07/14/22 1059  BP: (!) 150/97  Pulse: (!) 109  Resp: 16  Temp: 98.9 F (37.2 C)  SpO2: 100%  Weight: 137 lb 6.4 oz (62.3 kg)   Physical Exam Constitutional:      General: He is in acute distress.  Cardiovascular:     Rate and Rhythm: Normal rate and regular rhythm.     Heart sounds: Normal heart sounds.  Pulmonary:     Effort: Pulmonary effort is normal.     Breath sounds: Normal breath sounds.     Comments: Tracheostomy in place Abdominal:     General: Bowel sounds are normal.     Palpations: Abdomen is soft.  Skin:    General: Skin is warm and dry.  Neurological:     Mental Status: He is alert and oriented to person, place, and time.         Latest Ref Rng & Units 06/10/2022    8:43 PM  CMP  Glucose 70 - 99 mg/dL 125   BUN 6 - 20 mg/dL 16   Creatinine 0.61 - 1.24 mg/dL 1.00   Sodium 135 - 145 mmol/L 136   Potassium 3.5 - 5.1 mmol/L 3.3   Chloride 98 - 111 mmol/L 102   CO2 22 - 32 mmol/L 25   Calcium 8.9 - 10.3 mg/dL 7.7   Total  Protein 6.5 - 8.1 g/dL 5.7   Total Bilirubin 0.3 - 1.2 mg/dL 0.6   Alkaline Phos 38 -  126 U/L 53   AST 15 - 41 U/L 24   ALT 0 - 44 U/L 27       Latest Ref Rng & Units 06/10/2022    8:43 PM  CBC  WBC 4.0 - 10.5 K/uL 11.0   Hemoglobin 13.0 - 17.0 g/dL 10.0   Hematocrit 39.0 - 52.0 % 29.2   Platelets 150 - 400 K/uL 305     No images are attached to the encounter.  NM PET Image Restag (PS) Skull Base To Thigh  Result Date: 07/13/2022 CLINICAL DATA:  Subsequent treatment strategy for head neck carcinoma. Chemo radiation therapy and radiation therapy completed June 2023. EXAM: NUCLEAR MEDICINE PET SKULL BASE TO THIGH TECHNIQUE: 7.6 mCi F-18 FDG was injected intravenously. Full-ring PET imaging was performed from the skull base to thigh after the radiotracer. CT data was obtained and used for attenuation correction and anatomic localization. Fasting blood glucose: 109 mg/dl COMPARISON:  PET-CT 02/21/2022 FINDINGS: Mediastinal blood pool activity: SUV max 1.6 Liver activity: SUV max NA NECK: There is a rim of metabolic activity within the deep sulcus posterior to LEFT tongue base with SUV max equal 3.6. This is decreased in metabolic activity from 6.1 on comparison PET-CT scan. There is high-density metallic material within the sulcus presumed related to recent procedure. No hypermetabolic cervical lymph nodes. Incidental CT findings: Tracheostomy tube noted CHEST: There is a new ill-defined nodule in the superior segment of the LEFT lower lobe measuring 12 mm (image 104/CT series 2) which has mild metabolic activity (SUV max equal 1.9. several ground-glass nodules in the RIGHT upper lobe (image 88/2)which are also new from comparison exam and with mild metabolic activity. (SUV max equal 1.4. Incidental CT findings: None. ABDOMEN/PELVIS: No abnormal hypermetabolic activity within the liver, pancreas, adrenal glands, or spleen. No hypermetabolic lymph nodes in the abdomen or pelvis. Incidental CT findings:  None. SKELETON: No focal hypermetabolic activity to suggest skeletal metastasis. Incidental CT findings: None. IMPRESSION: 1. Persistent mild activity within a deep sulcus posterior to the LEFT tongue base differential including post inflammatory metabolic activity related to therapy versus residual carcinoma. 2. No metastatic adenopathy in the neck. 3. Bilateral new pulmonary nodules with mild metabolic activity. Favor infectious or inflammatory process. Recommend correlation with pulmonary infection and short-term CT follow-up. Electronically Signed   By: Suzy Bouchard M.D.   On: 07/13/2022 08:46     Assessment and plan- Patient is a 51 y.o. 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  He is s/p 5 cycles of weekly cisplatin chemotherapy with concurrent radiation treatment.  Chemo was stopped prematurely due to worsening mucositis and patient completed radiation treatment in April 2022.  He now has noted to have  biopsy-proven locally recurrent disease.  He completed second round of concurrent chemoradiation in June 2023.  This is a visit to discuss PET CT scan results and further management  I have reviewed PET CTNew diagnosis lobe scan images independently and discussed findings with the patient which does not show any evidence of distant metastatic disease.  He continues to have metabolic activity in the posterior part of left tongue base with an SUV of 3.6.  This is his main site of pain.  PET scan also showed ill-defined groundglass opacities in his lungs.  My plan is to follow-up on these opacities with a repeat CT chest without contrast.  I have personally spoken to Dr. Frazier Butt from [facility].  There  continues to be uptake at the site of the left tongue base biopsy x1 was negative.  Patient continues to have significant pain and also bled from the site and therefore has a prophylactic tracheostomy in place.  He is unable to eat any solid  food and his oral intake has gone down significantly and so has his weight.  Dr. Werner Lean office plans to call him so that patient can undergo definitive surgical management for the site.  Pain control continues to be a problem and given his prior history of cocaine use, ongoing marijuana use, suicidal ideation in the past as well as incarceration -I am finding it difficult to constantly increase his pain medications without the underlying issue being addressed.  I am giving you a short-term course of steroids prednisone daily for 2 weeks.  He will continue to stay on as needed oxycodone 10 mg 3 times daily without any further dose increase today.  I am hoping [facility] can go for some surgical management to help with his pain management as well.  Patient will continue gabapentin as well.  He will see NP Altha Harm in 1 month and I will see him back in 2 months.  For now he continues to get 2-week supply of oxycodone at a time   Visit Diagnosis 1. Squamous cell carcinoma of oropharynx ([place])   2. Neoplasm related pain      Dr. Randa Evens, MD, MPH [facility] [phone] 07/14/2022 3:53 PM

## 2022-07-19 ENCOUNTER — Other Ambulatory Visit: Payer: Self-pay | Admitting: Oncology

## 2022-07-19 ENCOUNTER — Other Ambulatory Visit: Payer: Self-pay | Admitting: *Deleted

## 2022-07-20 ENCOUNTER — Ambulatory Visit: Payer: Medicaid Other

## 2022-07-30 ENCOUNTER — Other Ambulatory Visit: Payer: Self-pay

## 2022-07-30 ENCOUNTER — Emergency Department
Admission: EM | Admit: 2022-07-30 | Discharge: 2022-07-30 | Disposition: A | Discharge status: Home / Self Care | Payer: Medicaid Other | Attending: Emergency Medicine | Admitting: Emergency Medicine

## 2022-07-30 ENCOUNTER — Encounter: Payer: Self-pay | Admitting: Intensive Care

## 2022-07-30 ENCOUNTER — Emergency Department: Payer: Medicaid Other

## 2022-07-30 DIAGNOSIS — R590 Localized enlarged lymph nodes: Secondary | ICD-10-CM | POA: Insufficient documentation

## 2022-07-30 DIAGNOSIS — Z85818 Personal history of malignant neoplasm of other sites of lip, oral cavity, and pharynx: Secondary | ICD-10-CM | POA: Insufficient documentation

## 2022-07-30 DIAGNOSIS — M542 Cervicalgia: Secondary | ICD-10-CM | POA: Diagnosis not present

## 2022-07-30 DIAGNOSIS — Z85810 Personal history of malignant neoplasm of tongue: Secondary | ICD-10-CM | POA: Diagnosis not present

## 2022-07-30 DIAGNOSIS — Z930 Tracheostomy status: Secondary | ICD-10-CM | POA: Diagnosis not present

## 2022-07-30 LAB — COMPREHENSIVE METABOLIC PANEL
ALT: 13 U/L (ref 0–44)
AST: 17 U/L (ref 15–41)
Albumin: 4.7 g/dL (ref 3.5–5.0)
Alkaline Phosphatase: 92 U/L (ref 38–126)
Anion gap: 9 (ref 5–15)
BUN: 14 mg/dL (ref 6–20)
CO2: 27 mmol/L (ref 22–32)
Calcium: 10 mg/dL (ref 8.9–10.3)
Chloride: 99 mmol/L (ref 98–111)
Creatinine, Ser: 0.74 mg/dL (ref 0.61–1.24)
GFR, Estimated: >60 mL/min (ref >60)
Glucose, Bld: 126 mg/dL — ABNORMAL HIGH (ref 70–99)
Potassium: 4.3 mmol/L (ref 3.5–5.1)
Sodium: 135 mmol/L (ref 135–145)
Total Bilirubin: 0.6 mg/dL (ref 0.3–1.2)
Total Protein: 8.9 g/dL — ABNORMAL HIGH (ref 6.5–8.1)

## 2022-07-30 LAB — CBC WITH DIFFERENTIAL/PLATELET
Abs Immature Granulocytes: 0.03 10*3/uL (ref 0.00–0.07)
Basophils Absolute: 0.1 10*3/uL (ref 0.0–0.1)
Basophils Relative: 1 %
Eosinophils Absolute: 0.1 10*3/uL (ref 0.0–0.5)
Eosinophils Relative: 2 %
HCT: 41.7 % (ref 39.0–52.0)
Hemoglobin: 13.7 g/dL (ref 13.0–17.0)
Immature Granulocytes: 0 %
Lymphocytes Relative: 26 %
Lymphs Abs: 1.9 10*3/uL (ref 0.7–4.0)
MCH: 30.6 pg (ref 26.0–34.0)
MCHC: 32.9 g/dL (ref 30.0–36.0)
MCV: 93.1 fL (ref 80.0–100.0)
Monocytes Absolute: 0.6 10*3/uL (ref 0.1–1.0)
Monocytes Relative: 8 %
Neutro Abs: 4.7 10*3/uL (ref 1.7–7.7)
Neutrophils Relative %: 63 %
Platelets: 474 10*3/uL — ABNORMAL HIGH (ref 150–400)
RBC: 4.48 MIL/uL (ref 4.22–5.81)
RDW: 13.3 % (ref 11.5–15.5)
WBC: 7.3 10*3/uL (ref 4.0–10.5)
nRBC: 0 % (ref 0.0–0.2)

## 2022-07-30 MED ORDER — HYDROMORPHONE HCL 1 MG/ML IJ SOLN
1.0000 mg | Freq: Once | INTRAMUSCULAR | Status: AC
  Administered 2022-07-30: 1 mg via INTRAVENOUS
  Filled 2022-07-30: qty 1

## 2022-07-30 MED ORDER — ONDANSETRON HCL 4 MG/2ML IJ SOLN
4.0000 mg | Freq: Once | INTRAMUSCULAR | Status: AC
  Administered 2022-07-30: 4 mg via INTRAVENOUS
  Filled 2022-07-30: qty 2

## 2022-07-30 MED ORDER — LACTATED RINGERS IV BOLUS
1000.0000 mL | Freq: Once | INTRAVENOUS | Status: AC
  Administered 2022-07-30: 1000 mL via INTRAVENOUS

## 2022-07-30 MED ORDER — IOHEXOL 300 MG/ML  SOLN
75.0000 mL | Freq: Once | INTRAMUSCULAR | Status: AC | PRN
  Administered 2022-07-30: 75 mL via INTRAVENOUS

## 2022-07-30 MED ORDER — OXYCODONE-ACETAMINOPHEN 5-325 MG PO TABS
2.0000 | ORAL_TABLET | Freq: Once | ORAL | Status: AC
  Administered 2022-07-30: 2 via ORAL
  Filled 2022-07-30: qty 2

## 2022-07-30 NOTE — Discharge Instructions (Signed)
Follow-up with your doctor regarding your increased pain in today's ER visit.  Your work-up in the emergency department shows reassuring results including your CT scan.  Return to the emergency department for any trouble swallowing, breathing or any other symptom personally concerning to yourself.

## 2022-07-30 NOTE — ED Triage Notes (Signed)
Patient c/o neck pain and problems with his trach. C/o pain and not able to eat due to pain. Reports upcoming appointment August 23, 2022.   Hx tongue and lymph node cancer. Two round of chemo and radiation the past 18 months.

## 2022-07-30 NOTE — ED Provider Notes (Signed)
[facility] Provider Note    Event Date/Time   First MD Initiated Contact with Patient 07/30/22 [phone]     (approximate)  History   Chief Complaint: Neck Pain  HPI  Paul Hebert is a 51 y.o. male with a past medical history of tongue and throat cancer who presents to the emergency department for increased pain and swelling to the tongue and throat.  According to the patient he has been dealing with this pain for over 1 year per patient.  Patient states recently the pain has worsened.  States he was doing better while he was on prednisone taper however the prednisone taper stopped several days ago and he feels like the pain and swelling have returned.  Patient states he is on chronic oxycodone for the pain but left the medication in his work truck which is now locked at his work until Monday.  States he has no access to pain medication over the weekend so he came to the emergency department for evaluation.  Here the patient is awake alert speaking in full sentences without any difficulty.  Patient does appear somewhat uncomfortable.  Patient is handling secretions without issue.  Physical Exam   Triage Vital Signs: ED Triage Vitals  Enc Vitals Group     BP 07/30/22 0836 (!) 119/103     Pulse Rate 07/30/22 0836 (!) 126     Resp 07/30/22 0836 16     Temp 07/30/22 0836 97.9 F (36.6 C)     Temp Source 07/30/22 0836 Axillary     SpO2 07/30/22 0836 99 %     Weight 07/30/22 0837 137 lb (62.1 kg)     Height 07/30/22 0837 '5\' 8"'$  (1.727 m)     Head Circumference --      Peak Flow --      Pain Score 07/30/22 0837 9     Pain Loc --      Pain Edu? --      Excl. in [place]? --     Most recent vital signs: Vitals:   07/30/22 0836  BP: (!) 119/103  Pulse: (!) 126  Resp: 16  Temp: 97.9 F (36.6 C)  SpO2: 99%    General: Awake, no distress.  Tracheostomy in place. CV:  Good peripheral perfusion.  Regular rate and rhythm  Resp:  Normal effort.  Equal breath sounds  bilaterally.  Abd:  No distention.  Soft, nontender.  No rebound or guarding. Other:  No obvious or at least significant edema of the tongue or throat noted on exam.  Patient does state pain to the left tongue as well as left side of the neck.  Lymphadenopathy present.   ED Results / Procedures / Treatments    RADIOLOGY  I have reviewed and interpreted the CT images.  I do not see any obvious pharyngeal blockage or significant mass effect. Radiology is read the CT scan as overall decreasing size of lymph nodes.  No complicating tracheostomy feature.   MEDICATIONS ORDERED IN ED: Medications  HYDROmorphone (DILAUDID) injection 1 mg (has no administration in time range)  ondansetron (ZOFRAN) injection 4 mg (has no administration in time range)  lactated ringers bolus 1,000 mL (has no administration in time range)     IMPRESSION / MDM / ASSESSMENT AND PLAN / ED COURSE  I reviewed the triage vital signs and the nursing notes.  Patient's presentation is most consistent with acute presentation with potential threat to life or bodily function.  Patient  presents emergency department for worsening pain and swelling in his tongue and neck.  No objective or at least significant swelling noted on my exam in the tongue or pharynx.  Patient does have a history of tongue and throat cancer, next follow-up November 8 per patient.  Patient states he has not had access to his pain medication over the weekend because he left his medication in his work truck which is locked until Monday.  Given the patient's discomfort we will treat with IV pain medication in the emergency department we will check labs and obtain CT imaging with contrast of the neck to evaluate for any significant mass effect.  Patient states he was doing much better on prednisone but has been worsening ever since stopping the prednisone.  I believe it would be reasonable to restart the patient on a prednisone taper until he is able to talk to  his doctor on Monday.  Patient agreeable to this plan as well.  CT scan is reassuring, no obstruction or significant mass effect, swelling and lymph nodes appear to be decreased in size compared to prior imaging.  CBC is normal, chemistry is normal.  Overall patient appears well, much better after pain medication.  Patient states he has access to his pain medication on Monday.  We will dose 2 tablets of Percocet prior to discharge and the patient is to follow-up with his doctor this week.  Patient agreeable to plan.  Patient agreeable to plan of care.  Appears well currently.  FINAL CLINICAL IMPRESSION(S) / ED DIAGNOSES   Neck pain     Note:  This document was prepared using Dragon voice recognition software and may include unintentional dictation errors.   Harvest Dark, MD 07/30/22 1016

## 2022-08-03 ENCOUNTER — Other Ambulatory Visit: Payer: Self-pay

## 2022-08-03 ENCOUNTER — Other Ambulatory Visit: Payer: Self-pay | Admitting: *Deleted

## 2022-08-03 MED ORDER — OXYCODONE HCL 10 MG PO TABS: ORAL_TABLET | ORAL | 0 refills | Status: DC

## 2022-08-14 ENCOUNTER — Other Ambulatory Visit: Payer: Self-pay

## 2022-08-14 ENCOUNTER — Encounter: Payer: Self-pay | Admitting: Hospice and Palliative Medicine

## 2022-08-14 ENCOUNTER — Inpatient Hospital Stay: Payer: Medicaid Other | Attending: Oncology | Admitting: Hospice and Palliative Medicine

## 2022-08-14 ENCOUNTER — Encounter (INDEPENDENT_AMBULATORY_CARE_PROVIDER_SITE_OTHER): Payer: Self-pay

## 2022-08-14 ENCOUNTER — Other Ambulatory Visit: Payer: Self-pay | Admitting: *Deleted

## 2022-08-14 VITALS — BP 127/83 | HR 89 | Temp 99.0°F | Ht 68.0 in

## 2022-08-14 DIAGNOSIS — C01 Malignant neoplasm of base of tongue: Secondary | ICD-10-CM | POA: Diagnosis present

## 2022-08-14 DIAGNOSIS — Z0283 Encounter for blood-alcohol and blood-drug test: Secondary | ICD-10-CM

## 2022-08-14 DIAGNOSIS — G893 Neoplasm related pain (acute) (chronic): Secondary | ICD-10-CM

## 2022-08-14 DIAGNOSIS — C109 Malignant neoplasm of oropharynx, unspecified: Secondary | ICD-10-CM

## 2022-08-14 NOTE — Progress Notes (Signed)
[place] at [facility] [phone] [phone]  Patient Care Team: System, Provider Not In as PCP - General Noreene Filbert, MD as Referring Physician (Radiation Oncology) Sindy Guadeloupe, MD as Consulting Physician (Oncology) Jules Husbands, MD as Consulting Physician (General Surgery) Gloris Ham, RN as Registered Nurse (Oncology)   NAME OF PATIENT: Paul Hebert  [id number]  [birth year]   DATE OF VISIT: 08/14/22  REASON FOR CONSULT: Paul Hebert is a 51 y.o. 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  Patient had local recurrence and is again and is status post concurrent chemoradiation. Patient has completed 6 cycles of cisplatin and radiation.  Patient was admitted 06/11/2022 to 06/22/2022 at [facility] with hemorrhage.  Patient was intubated and ultimately required tracheostomy.  Tongue biopsy was negative.   INTERVAL HISTORY: Recent PET/CT did not reveal any evidence of distant metastatic disease.  He continued to have hypermetabolic activity in the left tongue base correlating with the site of his pain.  Patient last saw Dr. Janese Banks 07/14/2022 at which time patient continued to endorse severe pain.  Dr. Janese Banks was in communication with [facility] surgery regarding interventional management as further increasing his pain medications was felt to be concerning due to patient's history of cocaine use, marijuana use, history of suicidal ideation, and recent incarceration.  Patient was started on short course of steroids and recommended to continue gabapentin.  Patient subsequently was seen in the ED on 07/30/2022 with complaint of pain.  Per documentation, patient was initially improved on steroids but worsened after the taper was completed.  He did not have  access to his pain meds as they were locked in his work truck over the weekend.  CT of the neck was obtained revealing decrease in swelling of the mucosal and submucosal tissues without any new or increasing densities or lymph nodes.  Patient was treated with IV pain medications and discharged home.  Patient returns to clinic today for follow-up.  Patient reports continued severe and persistent oral pain.  He admits that he is taking oxycodone more frequently than prescribed up to 4 to 5 times a day.  He says pain is interfering with his swallowing.  He says that as long as he takes oxycodone more frequently in addition to gabapentin and Magic mouthwash, but this results in pain being tolerable.  Denies any neurologic complaints. Denies recent fevers or illnesses. Denies any easy bleeding or bruising. Reports fair appetite and denies weight loss. Denies chest pain. Denies any nausea, vomiting, constipation, or diarrhea. Denies urinary complaints. Patient offers no further specific complaints today.  SOCIAL HISTORY:     reports that he has been smoking cigarettes. He has a 17.50 pack-year smoking history. He has quit using smokeless tobacco. He reports that he does not currently use alcohol. He reports current drug use. Drugs: Marijuana, "Crack" cocaine, and Methamphetamines.  Patient recently incarcerated.  He is not employed.  ADVANCE DIRECTIVES:  Does not have  CODE STATUS:    PAST MEDICAL HISTORY: Past Medical History:  Diagnosis Date   GERD (gastroesophageal reflux disease)    Tonsil cancer ([place])     PAST SURGICAL HISTORY:  Past Surgical History:  Procedure Laterality Date   LARYNGOSCOPY Left 01/24/2022   Procedure: DIRECT LARYNGOSCOPY WITH TONSIL BIOPSY;  Surgeon: Beverly Gust, MD;  Location: [facility]  ORS;  Service: ENT;  Laterality: Left;   PORT-A-CATH REMOVAL N/A 02/04/2021   Procedure: REMOVAL PORT-A-CATH;  Surgeon: Olean Ree, MD;  Location: [facility] ORS;  Service: General;   Laterality: N/A;   PORTA CATH INSERTION N/A 10/25/2020   Procedure: PORTA CATH INSERTION;  Surgeon: Algernon Huxley, MD;  Location: [place] CV LAB;  Service: Cardiovascular;  Laterality: N/A;   PORTA CATH INSERTION N/A 02/09/2022   Procedure: PORTA CATH INSERTION;  Surgeon: Algernon Huxley, MD;  Location: [place] [facility] LAB;  Service: Cardiovascular;  Laterality: N/A;   REMOVAL OF GASTROSTOMY TUBE N/A 02/04/2021   Procedure: REMOVAL OF GASTROSTOMY TUBE;  Surgeon: Olean Ree, MD;  Location: [facility] ORS;  Service: General;  Laterality: N/A;   TONGUE BIOPSY Left 01/24/2022   Procedure: TONGUE BIOPSY;  Surgeon: Beverly Gust, MD;  Location: [facility] ORS;  Service: ENT;  Laterality: Left;    HEMATOLOGY/ONCOLOGY HISTORY:  Oncology History  Squamous cell carcinoma of oropharynx ([place])  10/18/2020 Initial Diagnosis   Squamous cell carcinoma of oropharynx ([place])   10/18/2020 Cancer Staging   Staging form: Pharynx - HPV-Mediated Oropharynx, AJCC 8th Edition - Clinical stage from 10/18/2020: Stage I (cT2, cN1, cM0) - Signed by Sindy Guadeloupe, MD on 10/18/2020   11/01/2020 - 12/20/2020 Chemotherapy   Patient is on Treatment Plan : HEAD/NECK Cisplatin q7d     02/13/2022 - 04/06/2022 Chemotherapy   Patient is on Treatment Plan : HEAD/NECK Cisplatin q7d       ALLERGIES:  has No Known Allergies.  MEDICATIONS:  Current Outpatient Medications  Medication Sig Dispense Refill   dexamethasone (DECADRON) 4 MG tablet Take 4 mg by mouth 2 (two) times daily.     DULoxetine (CYMBALTA) 30 MG capsule Take 30 mg by mouth daily. Taking with 60 mg to equal 90 mg     DULoxetine (CYMBALTA) 60 MG capsule Take 1 capsule (60 mg total) by mouth daily. 30 capsule 3   gabapentin (NEURONTIN) 800 MG tablet Take 800 mg by mouth 3 (three) times daily.     IBU 600 MG tablet Take 600 mg by mouth 3 (three) times daily.     lansoprazole (PREVACID) 30 MG capsule Take 1 capsule (30 mg total) by mouth daily at 12 noon. 30 capsule 3   Oxycodone  HCl 10 MG TABS TAKE ONE TABLET BY MOUTH 3 TIMES DAILY 42 tablet 0   sucralfate (CARAFATE) 1 g tablet Take 1 tablet (1 g total) by mouth 3 (three) times daily before meals. 90 tablet 6   calcium carbonate (TUMS - DOSED IN MG ELEMENTAL CALCIUM) 500 MG chewable tablet Chew 1 tablet by mouth as needed for indigestion or heartburn. (Patient not taking: Reported on 06/26/2022)     magic mouthwash w/lidocaine SOLN Take 5 mLs by mouth 4 (four) times daily. (Patient not taking: Reported on 06/26/2022) 210 mL 0   nicotine (NICODERM CQ - DOSED IN MG/24 HR) 7 mg/24hr patch 7 mg daily. (Patient not taking: Reported on 08/14/2022)     ondansetron (ZOFRAN) 8 MG tablet Take 1 tablet (8 mg total) by mouth 2 (two) times daily as needed. Start on the third day after cisplatin chemotherapy. (Patient not taking: Reported on 02/13/2022) 30 tablet 1   No current facility-administered medications for this visit.   Facility-Administered Medications Ordered in Other Visits  Medication Dose Route Frequency Provider Last Rate Last Admin   heparin lock flush 100 UNIT/ML injection            heparin  lock flush 100 UNIT/ML injection            sodium chloride flush (NS) 0.9 % injection 10 mL  10 mL Intravenous PRN Sindy Guadeloupe, MD   10 mL at 04/05/22 1112    VITAL SIGNS: BP 127/83   Pulse 89   Temp 99 F (37.2 C) (Tympanic)   Ht '5\' 8"'$  (1.727 m)   BMI 20.83 kg/m  There were no vitals filed for this visit.  Estimated body mass index is 20.83 kg/m as calculated from the following:   Height as of this encounter: '5\' 8"'$  (1.727 m).   Weight as of 07/30/22: 137 lb (62.1 kg).  LABS: CBC:    Component Value Date/Time   WBC 7.3 07/30/2022 0847   HGB 13.7 07/30/2022 0847   HCT 41.7 07/30/2022 0847   PLT 474 (H) 07/30/2022 0847   MCV 93.1 07/30/2022 0847   NEUTROABS 4.7 07/30/2022 0847   LYMPHSABS 1.9 07/30/2022 0847   MONOABS 0.6 07/30/2022 0847   EOSABS 0.1 07/30/2022 0847   BASOSABS 0.1 07/30/2022 0847    Comprehensive Metabolic Panel:    Component Value Date/Time   NA 135 07/30/2022 0847   K 4.3 07/30/2022 0847   CL 99 07/30/2022 0847   CO2 27 07/30/2022 0847   BUN 14 07/30/2022 0847   CREATININE 0.74 07/30/2022 0847   GLUCOSE 126 (H) 07/30/2022 0847   CALCIUM 10.0 07/30/2022 0847   AST 17 07/30/2022 0847   ALT 13 07/30/2022 0847   ALKPHOS 92 07/30/2022 0847   BILITOT 0.6 07/30/2022 0847   PROT 8.9 (H) 07/30/2022 0847   ALBUMIN 4.7 07/30/2022 0847    RADIOGRAPHIC STUDIES: CT Soft Tissue Neck W Contrast  Result Date: 07/30/2022 CLINICAL DATA:  Head and neck cancer. Assess treatment response. Worsening neck pain. History of tongue cancer and lymph node cancer. Chemotherapy and radiation. EXAM: CT NECK WITH CONTRAST TECHNIQUE: Multidetector CT imaging of the neck was performed using the standard protocol following the bolus administration of intravenous contrast. RADIATION DOSE REDUCTION: This exam was performed according to the departmental dose-optimization program which includes automated exposure control, adjustment of the mA and/or kV according to patient size and/or use of iterative reconstruction technique. CONTRAST:  81m OMNIPAQUE IOHEXOL 300 MG/ML  SOLN COMPARISON:  PET scan 07/11/2022.  Neck CT 06/07/2022 FINDINGS: Pharynx and larynx: Overall decreased swelling of the mucosal and submucosal tissues of the oropharynx and hypopharynx consistent with resolving treatment effect. This includes decreased swelling in the left glossotonsillar sulcus that was described previously. No increasing density. Newly seen tracheostomy since the prior neck CT, without visible complicating feature. Salivary glands: Parotid and submandibular glands remain normal. Thyroid: Normal Lymph nodes: No enlarging or low-density nodes on either side of the neck. Vascular: No abnormal vascular finding. Right internal jugular port. Limited intracranial: Negative Visualized orbits: Normal Mastoids and visualized  paranasal sinuses: Clear Skeleton: Negative Upper chest: See results of recent PET scan. Emphysema and scarring. Other: None IMPRESSION: 1. Overall decreased swelling of the mucosal and submucosal tissues of the oropharynx and hypopharynx consistent with resolving treatment effect. This includes decreased swelling in the left glossotonsillar sulcus that was described previously. No increasing density. No enlarging or low-density nodes on either side of the neck. 2. Newly seen tracheostomy without visible complicating feature. Electronically Signed   By: MNelson ChimesM.D.   On: 07/30/2022 10:01    PERFORMANCE STATUS (ECOG) : 1 - Symptomatic but completely ambulatory  Review of Systems Unless otherwise noted,  a complete review of systems is negative.  Physical Exam General: NAD Pulmonary: Unlabored Extremities: no edema, no joint deformities Skin: no rashes Neurological: Grossly nonfocal  Exam deferred  IMPRESSION/PLAN: Neoplasm related pain -patient pending follow-up at [facility].  He says that he has an appointment on 11/6 although I do not see that listed in the chart.  Patient says that his previous appointment was canceled due to transportation issues.  Patient admits that he is taking oxycodone more frequently than prescribed but that it is effective but short-lived.  Case and plan discussed with Dr. Janese Banks.  Will check UDS and if negative, plan to start patient on transdermal fentanyl to reduce need for frequent oxycodone.  Continue gabapentin and and viscous lidocaine.   RTC in 1 month to see Dr. Janese Banks  Patient expressed understanding and was in agreement with this plan. He also understands that He can call clinic at any time with any questions, concerns, or complaints.   Thank you for allowing me to participate in the care of this very pleasant patient.   Time Total: 15 minutes  Visit consisted of counseling and education dealing with the complex and emotionally intense issues of symptom  management in the setting of serious illness.Greater than 50%  of this time was spent counseling and coordinating care related to the above assessment and plan.  Signed by: Altha Harm, PhD, NP-C

## 2022-08-17 LAB — MISC LABCORP TEST (SEND OUT): Labcorp test code: 791194

## 2022-08-18 ENCOUNTER — Telehealth: Payer: Self-pay | Admitting: *Deleted

## 2022-08-18 ENCOUNTER — Other Ambulatory Visit: Payer: Self-pay | Admitting: *Deleted

## 2022-08-18 ENCOUNTER — Encounter: Payer: Self-pay | Admitting: *Deleted

## 2022-08-18 MED ORDER — OXYCODONE HCL 10 MG PO TABS: ORAL_TABLET | ORAL | 0 refills | Status: DC

## 2022-08-18 MED ORDER — FENTANYL 12 MCG/HR TD PT72: 1.0000 | MEDICATED_PATCH | TRANSDERMAL | 0 refills | Status: DC

## 2022-08-18 MED ORDER — MAGIC MOUTHWASH W/LIDOCAINE: 5.0000 mL | Freq: Four times a day (QID) | ORAL | 0 refills | Status: DC

## 2022-08-18 NOTE — Telephone Encounter (Signed)
Paul Hebert (Key: [id number]) PA needed for fentanyl patches  This request has received a Favorable outcome.  Please note any additional information provided by [facility] at the bottom of this request.  Left vm for [facility] that script was approved

## 2022-08-18 NOTE — Telephone Encounter (Signed)
Per Merrily Pew, NP - "UDS results are back and I sent in the prescription for the fentanyl to his pharmacy"  Before I could attempt to call patient back. Pt arrived in the clinic at the front desk to inquire if scripts have been sent. Pt informed that scripts were sent to the pharmacy."

## 2022-08-21 ENCOUNTER — Other Ambulatory Visit: Payer: Self-pay | Admitting: *Deleted

## 2022-08-21 MED ORDER — MAGIC MOUTHWASH W/LIDOCAINE: 5.0000 mL | Freq: Four times a day (QID) | ORAL | 0 refills | Status: DC

## 2022-08-28 ENCOUNTER — Telehealth: Payer: Self-pay | Admitting: *Deleted

## 2022-08-28 ENCOUNTER — Other Ambulatory Visit: Payer: Self-pay | Admitting: Hospice and Palliative Medicine

## 2022-08-28 ENCOUNTER — Other Ambulatory Visit: Payer: Self-pay | Admitting: *Deleted

## 2022-08-28 MED ORDER — LIDOCAINE VISCOUS HCL 2 % MT SOLN: 15.0000 mL | OROMUCOSAL | 0 refills | Status: DC | PRN

## 2022-08-28 MED ORDER — MAGIC MOUTHWASH W/LIDOCAINE: 5.0000 mL | Freq: Four times a day (QID) | ORAL | 3 refills | Status: DC | PRN

## 2022-08-28 NOTE — Telephone Encounter (Signed)
Patient s/o called asking for a refill of his lidocaine mouthwash to be sent in . Please fax copy of Magic Mouth Wash prescription to [facility]

## 2022-08-28 NOTE — Telephone Encounter (Signed)
Judeen Hammans is working on this patient's care.

## 2022-09-01 ENCOUNTER — Other Ambulatory Visit: Payer: Medicaid Other

## 2022-09-01 ENCOUNTER — Inpatient Hospital Stay: Payer: Medicaid Other | Attending: Oncology

## 2022-09-01 ENCOUNTER — Other Ambulatory Visit: Payer: Self-pay | Admitting: *Deleted

## 2022-09-01 DIAGNOSIS — C01 Malignant neoplasm of base of tongue: Secondary | ICD-10-CM | POA: Insufficient documentation

## 2022-09-01 DIAGNOSIS — C109 Malignant neoplasm of oropharynx, unspecified: Secondary | ICD-10-CM

## 2022-09-01 MED ORDER — FENTANYL 12 MCG/HR TD PT72: 1.0000 | MEDICATED_PATCH | TRANSDERMAL | 0 refills | Status: DC

## 2022-09-01 MED ORDER — OXYCODONE HCL 10 MG PO TABS: ORAL_TABLET | ORAL | 0 refills | Status: DC

## 2022-09-06 LAB — MISC LABCORP TEST (SEND OUT): Labcorp test code: 791194

## 2022-09-11 ENCOUNTER — Other Ambulatory Visit: Payer: Self-pay | Admitting: *Deleted

## 2022-09-11 NOTE — Telephone Encounter (Signed)
[facility]- requested RF to be sent for oxycodone

## 2022-09-13 ENCOUNTER — Ambulatory Visit
Admission: RE | Admit: 2022-09-13 | Discharge: 2022-09-13 | Disposition: A | Discharge status: Home / Self Care | Payer: Medicaid Other | Source: Ambulatory Visit | Attending: Oncology | Admitting: Oncology

## 2022-09-13 DIAGNOSIS — C109 Malignant neoplasm of oropharynx, unspecified: Secondary | ICD-10-CM | POA: Insufficient documentation

## 2022-09-15 ENCOUNTER — Telehealth: Payer: Self-pay

## 2022-09-15 ENCOUNTER — Encounter: Payer: Self-pay | Admitting: Oncology

## 2022-09-15 ENCOUNTER — Other Ambulatory Visit: Payer: Self-pay | Admitting: *Deleted

## 2022-09-15 ENCOUNTER — Inpatient Hospital Stay: Payer: Medicaid Other | Attending: Oncology | Admitting: Oncology

## 2022-09-15 ENCOUNTER — Telehealth: Payer: Self-pay | Admitting: *Deleted

## 2022-09-15 ENCOUNTER — Other Ambulatory Visit: Payer: Self-pay | Admitting: Oncology

## 2022-09-15 VITALS — BP 145/95 | HR 102 | Temp 98.2°F | Wt 150.0 lb

## 2022-09-15 DIAGNOSIS — G893 Neoplasm related pain (acute) (chronic): Secondary | ICD-10-CM | POA: Insufficient documentation

## 2022-09-15 DIAGNOSIS — Z923 Personal history of irradiation: Secondary | ICD-10-CM | POA: Insufficient documentation

## 2022-09-15 DIAGNOSIS — Z9221 Personal history of antineoplastic chemotherapy: Secondary | ICD-10-CM | POA: Diagnosis not present

## 2022-09-15 DIAGNOSIS — C109 Malignant neoplasm of oropharynx, unspecified: Secondary | ICD-10-CM

## 2022-09-15 DIAGNOSIS — F17210 Nicotine dependence, cigarettes, uncomplicated: Secondary | ICD-10-CM | POA: Diagnosis not present

## 2022-09-15 DIAGNOSIS — Z79899 Other long term (current) drug therapy: Secondary | ICD-10-CM | POA: Diagnosis not present

## 2022-09-15 DIAGNOSIS — C01 Malignant neoplasm of base of tongue: Secondary | ICD-10-CM | POA: Insufficient documentation

## 2022-09-15 MED ORDER — FENTANYL 12 MCG/HR TD PT72: 1.0000 | MEDICATED_PATCH | TRANSDERMAL | 0 refills | Status: DC

## 2022-09-15 MED ORDER — OXYCODONE HCL 20 MG PO TABS: 10.0000 mg | ORAL_TABLET | Freq: Three times a day (TID) | ORAL | 0 refills | Status: DC

## 2022-09-15 MED ORDER — OXYCODONE HCL 10 MG PO TABS: ORAL_TABLET | ORAL | 0 refills | Status: DC

## 2022-09-15 MED ORDER — LIDOCAINE VISCOUS HCL 2 % MT SOLN: 15.0000 mL | OROMUCOSAL | 0 refills | Status: DC | PRN

## 2022-09-15 NOTE — Telephone Encounter (Signed)
[facility] calling in regards to Rx from Dr Janese Banks for oxycodone -  '10mg'$  tablet is not available but '20mg'$  is. If it could be sent for '20mg'$  tablets and to take 1/2 tablet 3x day #21 tablets and cancel the prior rx

## 2022-09-15 NOTE — Telephone Encounter (Signed)
I have telephone  calls from pharmacy that 10 mg oxycodone not in stock and can give pt 20 mg instead. Sent in 20 mg oxycodone and get a phone call from triage from [facility] stating that he has new insurance medicaid and both the duragesic patches and 20 mg oxycodone and it both needs prior auth. Then pt shows up at the point that he did not know if he can get 20 mg oxycodone was sent. But he left before the conversation that I got from pharmacy that it needs PA. Per sherry at [facility] they will do the meds on byrd fund and then if the medicaid covers it then they will make sure the byrd fund will just pay the medicaid  cost.

## 2022-09-15 NOTE — Progress Notes (Signed)
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 Patient states that he presented with these complaints 6 months ago and had a CT soft tissue neck which did not show any malignancy.  He continued to have on and off pain which was gradually getting worse and he presented back to the ER on 09/27/2020.     CT soft tissue neck showed a left base of tongue mass measuring 3.4 x 2.6 x 3.3 cm which was invading the tongue along the anterior and medial margin and extending into the left vallecula.  Possible superior and posterior extension to involve the left palatine tonsil the lesion abuts the uvula with early extension into the soft palate.  Multiple left level 2 lymph nodes largest of which was 1.2 cm.  Additional necrotic limited to level 3 nodal conglomerate measures 1.3 cm.  No right-sided adenopathy.   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  PET CT scan Showed left base of tongue mass measuring about 2.8 cm with enlarged centrally necrotic level 2/3 left-sided and lymph nodes with an SUV about 3.7.  No hypermetabolic right neck nodes.  No findings of distant metastatic disease   Patient completed 5 cycles of weekly cisplatin chemotherapy between January to March 2022.  There were  treatment interruptions due to mucositis.  He could not complete 7 cycles eventually.  He did complete his radiation treatment.   Towards the end of radiation patient was admitted for suicidal ideation.  At the end of chemoradiation patient did not have any evidence of residual disease.  However he was found to have biopsy-proven local recurrence in March 2023.     Patient completed second round of chemoradiation in late June 2023    Interval history-patient continues to have significant pain at the back of his throat which makes it difficult for him to eat or drink.  He is taking oxycodone 3 times a day and fentanyl as prescribed.  Denies taking any illicit drugs such as cocaine.  He has an appointment with Dr. Ihor Austin next week.  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[facility])      Past Surgical History:  Procedure Laterality Date   LARYNGOSCOPY Left 01/24/2022   Procedure: DIRECT LARYNGOSCOPY WITH TONSIL BIOPSY;  Surgeon: Beverly Gust, MD;  Location: [facility] ORS;  Service: ENT;  Laterality: Left;   PORT-A-CATH REMOVAL N/A 02/04/2021   Procedure: REMOVAL PORT-A-CATH;  Surgeon: Olean Ree, MD;  Location: [facility] OR[facility];  Service: General;  Laterality: N/A;   PORTA CATH INSERTION N/A 10/25/2020   Procedure:  PORTA CATH INSERTION;  Surgeon: Algernon Huxley, MD;  Location: [place] [facility];  Service: Cardiovascular;  Laterality: N/A;   PORTA CATH INSERTION N/A 02/09/2022   Procedure: PORTA CATH INSERTION;  Surgeon: Algernon Huxley, MD;  Location: [place] [facility];  Service: Cardiovascular;  Laterality: N/A;   REMOVAL OF GASTROSTOMY TUBE N/A 02/04/2021   Procedure: REMOVAL OF GASTROSTOMY TUBE;  Surgeon: Olean Ree, MD;  Location: [facility] OR[facility];  Service:  General;  Laterality: N/A;   TONGUE BIOPSY Left 01/24/2022   Procedure: TONGUE BIOPSY;  Surgeon: Beverly Gust, MD;  Location: [facility] OR[facility];  Service: ENT;  Laterality: Left;    Social History   Socioeconomic History   Marital status: Single    Spouse name: Not on file   Number of children: Not on file   Years of education: Not on file   Highest education level: Not on file  Occupational History   Not on file  Tobacco Use   Smoking status: Every Day    Packs/day: 0.50    Years: 35.00    Total pack years: 17.50    Types: Cigarettes   Smokeless tobacco: Former   Tobacco comments:    Patches very rarely  Scientific laboratory technician Use: Former  Substance and Sexual Activity   Alcohol use: Not Currently   Drug use: Yes    Types: Marijuana, "Crack" cocaine, Methamphetamines    Comment: marijuana every day except the joint   Sexual activity: Not Currently  Other Topics Concern   Not on file  Social History Narrative   Not on file   Social Determinants of Health   Financial Resource Strain: Not on file  Food Insecurity: Not on file  Transportation Needs: Not on file  Physical Activity: Not on file  Stress: Not on file  Social Connections: Not on file  Intimate Partner Violence: Not on file    Family History  Problem Relation Age of Onset   Cancer Father      Current Outpatient Medications:    dexamethasone (DECADRON) 4 MG tablet, Take 4 mg by mouth 2 (two) times daily., Disp: , Rfl:    DULoxetine (CYMBALTA) 30 MG capsule, Take 30 mg by mouth daily. Taking with 60 mg to equal 90 mg, Disp: , Rfl:    DULoxetine (CYMBALTA) 60 MG capsule, Take 1 capsule (60 mg total) by mouth daily., Disp: 30 capsule, Rfl: 3   gabapentin (NEURONTIN) 800 MG tablet, Take 800 mg by mouth 3 (three) times daily., Disp: , Rfl:    IBU 600 MG tablet, Take 600 mg by mouth 3 (three) times daily., Disp: , Rfl:    lansoprazole (PREVACID) 30 MG capsule, Take 1 capsule (30 mg total) by mouth daily at 12  noon., Disp: 30 capsule, Rfl: 3   magic mouthwash w/lidocaine SOLN, Take 5 mLs by mouth 4 (four) times daily as needed for mouth pain., Disp: 210 mL, Rfl: 3   sucralfate (CARAFATE) 1 g tablet, Take 1 tablet (1 g total) by mouth 3 (three) times daily before meals., Disp: 90 tablet, Rfl: 6   calcium carbonate (TUMS - DOSED IN MG ELEMENTAL CALCIUM) 500 MG chewable tablet, Chew 1 tablet by mouth as needed for indigestion or heartburn. (Patient not taking: Reported on 06/26/2022), Disp: , Rfl:    fentaNYL (DURAGESIC) 12 MCG/HR, Place 1 patch onto the skin every 3 (three) days., Disp: 5 patch, Rfl: 0   lidocaine (XYLOCAINE) 2 % solution, Use as directed 15 mLs in  the mouth or throat every 4 (four) hours as needed for mouth pain., Disp: 600 mL, Rfl: 0   nicotine (NICODERM CQ - DOSED IN MG/24 HR) 7 mg/24hr patch, 7 mg daily. (Patient not taking: Reported on 08/14/2022), Disp: , Rfl:    ondansetron (ZOFRAN) 8 MG tablet, Take 1 tablet (8 mg total) by mouth 2 (two) times daily as needed. Start on the third day after cisplatin chemotherapy. (Patient not taking: Reported on 02/13/2022), Disp: 30 tablet, Rfl: 1   Oxycodone HCl 10 MG TABS, TAKE ONE TABLET BY MOUTH 3 TIMES DAILY, Disp: 42 tablet, Rfl: 0 No current facility-administered medications for this visit.  Facility-Administered Medications Ordered in Other Visits:    heparin lock flush 100 UNIT/ML injection, , , ,    heparin lock flush 100 UNIT/ML injection, , , ,    sodium chloride flush (NS) 0.9 % injection 10 mL, 10 mL, Intravenous, PRN, Sindy Guadeloupe, MD, 10 mL at 04/05/22 1112  Physical exam:  Vitals:   09/15/22 1035  BP: (!) 145/95  Pulse: (!) 102  Temp: 98.2 F (36.8 C)  TempSrc: Tympanic  Weight: 150 lb (68 kg)   Physical Exam HENT:     Mouth/Throat:     Comments: There is chronic ulceration noted at the back of the throat on the left side Cardiovascular:     Rate and Rhythm: Normal rate and regular rhythm.     Heart sounds: Normal  heart sounds.  Pulmonary:     Effort: Pulmonary effort is normal.     Breath sounds: Normal breath sounds.  Skin:    General: Skin is warm and dry.  Neurological:     Mental Status: He is alert and oriented to person, place, and time.         Latest Ref Rng & Units 07/30/2022    8:47 AM  CMP  Glucose 70 - 99 mg/dL 126   BUN 6 - 20 mg/dL 14   Creatinine 0.61 - 1.24 mg/dL 0.74   Sodium 135 - 145 mmol/L 135   Potassium 3.5 - 5.1 mmol/L 4.3   Chloride 98 - 111 mmol/L 99   CO2 22 - 32 mmol/L 27   Calcium 8.9 - 10.3 mg/dL 10.0   Total Protein 6.5 - 8.1 g/dL 8.9   Total Bilirubin 0.3 - 1.2 mg/dL 0.6   Alkaline Phos 38 - 126 U/L 92   AST 15 - 41 U/L 17   ALT 0 - 44 U/L 13       Latest Ref Rng & Units 07/30/2022    8:47 AM  CBC  WBC 4.0 - 10.5 K/uL 7.3   Hemoglobin 13.0 - 17.0 g/dL 13.7   Hematocrit 39.0 - 52.0 % 41.7   Platelets 150 - 400 K/uL 474     No images are attached to the encounter.  CT Chest Wo Contrast  Result Date: 09/14/2022 CLINICAL DATA:  Squamous cell carcinoma of the oropharynx without on going through pain for 6 months. * Tracking Code: BO * EXAM: CT CHEST WITHOUT CONTRAST TECHNIQUE: Multidetector CT imaging of the chest was performed following the standard protocol without IV contrast. RADIATION DOSE REDUCTION: This exam was performed according to the departmental dose-optimization program which includes automated exposure control, adjustment of the mA and/or kV according to patient size and/or use of iterative reconstruction technique. COMPARISON:  Chest CT 01/30/2022.  PET-CT 07/11/2022. FINDINGS: Cardiovascular: Right IJ Port-A-Cath extends to the superior cavoatrial junction. Mild atherosclerosis of the aorta, great  vessels and coronary arteries. The heart size is normal. There is no pericardial effusion. Mediastinum/Nodes: There are no enlarged mediastinal, hilar or axillary lymph nodes.Hilar assessment is limited by the lack of intravenous contrast,  although the hilar contours appear unchanged. Tracheostomy appears adequately position. No abnormality of the thyroid gland or esophagus demonstrated. Lungs/Pleura: There is no pleural effusion. Mild centrilobular and paraseptal emphysema with stable bullous change at the right lung apex. The patchy ground-glass opacity seen on PET-CT have resolved, favoring an inflammatory process. No suspicious pulmonary nodules. Upper abdomen: The visualized upper abdomen appears stable, without suspicious findings. Stable lesion in the dome of the right hepatic lobe favoring a cyst. Musculoskeletal/Chest wall: There is no chest wall mass or suspicious osseous finding. Stable chronic Schmorl's node involving the superior endplate of Q03. IMPRESSION: 1. Interval resolution of patchy ground-glass opacities seen on PET-CT, favoring an inflammatory process. 2. No evidence of metastatic disease in the chest. 3. Aortic Atherosclerosis (ICD10-I70.0) and Emphysema (ICD10-J43.9). Electronically Signed   By: Richardean Sale M.D.   On: 09/14/2022 10:10     Assessment and plan- Patient is a 51 y.o. 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  He is s/p 5 cycles of weekly cisplatin chemotherapy with concurrent radiation treatment.  Chemo was stopped prematurely due to worsening mucositis and patient completed radiation treatment in April 2022.  He now has noted to have  biopsy-proven locally recurrent disease.  He completed second round of concurrent chemoradiation in June 2023.  This is a routine follow-up visit for ongoing throat pain  Patient has had a biopsy of the ulcerated area by Dr. Frazier Butt at [facility] and was negativeFor malignancy.  Subsequent PET scan showed hypermetabolism in that area but infection/inflammation versus malignancy could not be ruled out.  He also had CT soft tissue neck in October 2023 which was done in the ER after he had received steroids which showed  overall decreased swelling of the oropharynx and hypopharynx and there were no suspicious findings of worsening malignancy there.  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  Patient has an appointment coming up with [facility] next week.  Remains to be seen if any intervention can be done which would help him with his pain regardless of whether this is malignancy or not.  I reviewed CT chest toFollow-up on groundglass opacities which are present in the lungs and they appear to be resolving consistent with inflammatory etiology.  For now I am continuing him on the fentanyl patch andOxycodone but I do not plan to increase any doses at this time.  We will refill his pain medication 2 weeks at a time and also perform random U tox screenings.  Patient admits to using his pain medicines only as prescribed.  I have reviewed his [place] as well today and he has not been getting narcotics from any other sources.  He does admit to using occasional marijuana to help him with his pain but denies any cocaine use.  He will see NP Altha Harm in 1 month and I will see him back in 2 months with labs.  I am holding off on any further imaging at this time   Visit Diagnosis 1. Squamous cell carcinoma of oropharynx ([place])   2. Neoplasm related pain  Dr. Randa Evens, MD, MPH [facility] [phone] 09/15/2022 1:23 PM

## 2022-09-15 NOTE — Telephone Encounter (Signed)
Pt. Pharmacy does not have 10 mg so we sent another rx for 20 mg

## 2022-09-15 NOTE — Telephone Encounter (Signed)
In addition to my last message, [facility] states that we need to make sure all of his prescriptions are approved by the byrd fund because his insurance has expired Call back # [phone]

## 2022-09-29 ENCOUNTER — Other Ambulatory Visit: Payer: Self-pay | Admitting: Oncology

## 2022-09-29 MED ORDER — OXYCODONE HCL 20 MG PO TABS: 10.0000 mg | ORAL_TABLET | Freq: Three times a day (TID) | ORAL | 0 refills | Status: DC

## 2022-09-29 MED ORDER — FENTANYL 12 MCG/HR TD PT72: 1.0000 | MEDICATED_PATCH | TRANSDERMAL | 0 refills | Status: DC

## 2022-09-29 MED ORDER — OXYCODONE HCL 10 MG PO TABS: ORAL_TABLET | ORAL | 0 refills | Status: DC

## 2022-10-13 ENCOUNTER — Other Ambulatory Visit: Payer: Self-pay | Admitting: Oncology

## 2022-10-13 ENCOUNTER — Telehealth: Payer: Self-pay | Admitting: *Deleted

## 2022-10-13 MED ORDER — FENTANYL 12 MCG/HR TD PT72: 1.0000 | MEDICATED_PATCH | TRANSDERMAL | 0 refills | Status: DC

## 2022-10-13 MED ORDER — OXYCODONE HCL 20 MG PO TABS: ORAL_TABLET | ORAL | 0 refills | Status: DC

## 2022-10-13 NOTE — Telephone Encounter (Signed)
Pt called and left message on triage line that he needs his fentanyl patches and oxycodone medication filled.  Pt states he is going out of town this afternoon for the holiday weekend. Requests medication be sent today if possible.

## 2022-10-13 NOTE — Telephone Encounter (Signed)
Nurse called and informed pt that requested pain medications will be sent to pharmacy. Pt verbalized understanding.

## 2022-10-17 ENCOUNTER — Other Ambulatory Visit: Payer: Medicaid Other

## 2022-10-17 ENCOUNTER — Inpatient Hospital Stay: Payer: Medicaid Other | Admitting: Hospice and Palliative Medicine

## 2022-10-17 ENCOUNTER — Inpatient Hospital Stay: Payer: Medicaid Other

## 2022-10-25 ENCOUNTER — Encounter: Payer: Self-pay | Admitting: Hospice and Palliative Medicine

## 2022-10-25 ENCOUNTER — Inpatient Hospital Stay (HOSPITAL_BASED_OUTPATIENT_CLINIC_OR_DEPARTMENT_OTHER): Payer: Medicaid Other | Admitting: Hospice and Palliative Medicine

## 2022-10-25 ENCOUNTER — Inpatient Hospital Stay: Payer: Medicaid Other | Attending: Oncology

## 2022-10-25 ENCOUNTER — Telehealth: Payer: Self-pay

## 2022-10-25 VITALS — BP 154/110 | HR 115 | Temp 97.0°F | Resp 18 | Wt 148.0 lb

## 2022-10-25 DIAGNOSIS — C01 Malignant neoplasm of base of tongue: Secondary | ICD-10-CM | POA: Diagnosis present

## 2022-10-25 DIAGNOSIS — Z923 Personal history of irradiation: Secondary | ICD-10-CM | POA: Insufficient documentation

## 2022-10-25 DIAGNOSIS — C109 Malignant neoplasm of oropharynx, unspecified: Secondary | ICD-10-CM

## 2022-10-25 DIAGNOSIS — G893 Neoplasm related pain (acute) (chronic): Secondary | ICD-10-CM | POA: Insufficient documentation

## 2022-10-25 LAB — CBC WITH DIFFERENTIAL/PLATELET
Abs Immature Granulocytes: 0.08 10*3/uL — ABNORMAL HIGH (ref 0.00–0.07)
Basophils Absolute: 0 10*3/uL (ref 0.0–0.1)
Basophils Relative: 0 %
Eosinophils Absolute: 0 10*3/uL (ref 0.0–0.5)
Eosinophils Relative: 0 %
HCT: 40.5 % (ref 39.0–52.0)
Hemoglobin: 14 g/dL (ref 13.0–17.0)
Immature Granulocytes: 1 %
Lymphocytes Relative: 11 %
Lymphs Abs: 1.1 10*3/uL (ref 0.7–4.0)
MCH: 30.3 pg (ref 26.0–34.0)
MCHC: 34.6 g/dL (ref 30.0–36.0)
MCV: 87.7 fL (ref 80.0–100.0)
Monocytes Absolute: 0.3 10*3/uL (ref 0.1–1.0)
Monocytes Relative: 4 %
Neutro Abs: 8 10*3/uL — ABNORMAL HIGH (ref 1.7–7.7)
Neutrophils Relative %: 84 %
Platelets: 309 10*3/uL (ref 150–400)
RBC: 4.62 MIL/uL (ref 4.22–5.81)
RDW: 14.5 % (ref 11.5–15.5)
WBC: 9.6 10*3/uL (ref 4.0–10.5)
nRBC: 0 % (ref 0.0–0.2)

## 2022-10-25 LAB — COMPREHENSIVE METABOLIC PANEL
ALT: 16 U/L (ref 0–44)
AST: 18 U/L (ref 15–41)
Albumin: 4.1 g/dL (ref 3.5–5.0)
Alkaline Phosphatase: 58 U/L (ref 38–126)
Anion gap: 10 (ref 5–15)
BUN: 21 mg/dL — ABNORMAL HIGH (ref 6–20)
CO2: 26 mmol/L (ref 22–32)
Calcium: 8.9 mg/dL (ref 8.9–10.3)
Chloride: 100 mmol/L (ref 98–111)
Creatinine, Ser: 0.91 mg/dL (ref 0.61–1.24)
GFR, Estimated: >60 mL/min (ref >60)
Glucose, Bld: 107 mg/dL — ABNORMAL HIGH (ref 70–99)
Potassium: 4.3 mmol/L (ref 3.5–5.1)
Sodium: 136 mmol/L (ref 135–145)
Total Bilirubin: <0.1 mg/dL — ABNORMAL LOW (ref 0.3–1.2)
Total Protein: 7.5 g/dL (ref 6.5–8.1)

## 2022-10-25 MED ORDER — OXYCODONE HCL 10 MG PO TABS: ORAL_TABLET | ORAL | 0 refills | Status: DC

## 2022-10-25 MED ORDER — FENTANYL 25 MCG/HR TD PT72: 1.0000 | MEDICATED_PATCH | TRANSDERMAL | 0 refills | Status: DC

## 2022-10-25 NOTE — Progress Notes (Signed)
[place] at [facility] [phone] [phone]  Patient Care Team: System, Provider Not In as PCP - General Noreene Filbert, MD as Referring Physician (Radiation Oncology) Sindy Guadeloupe, MD as Consulting Physician (Oncology) Jules Husbands, MD as Consulting Physician (General Surgery) Gloris Ham, RN as Registered Nurse (Oncology)   NAME OF PATIENT: Paul Hebert  [id number]  [birth year]   DATE OF VISIT: 10/25/22  REASON FOR CONSULT: Paul Hebert is a 52 y.o. 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  Patient had local recurrence and is again and is status post concurrent chemoradiation. Patient has completed 6 cycles of cisplatin and radiation.  Patient was admitted 06/11/2022 to 06/22/2022 at [facility] with hemorrhage.  Patient was intubated and ultimately required tracheostomy.  Tongue biopsy was negative.   PET/CT 07/11/22 did not reveal any evidence of distant metastatic disease.  He continued to have hypermetabolic activity in the left tongue base correlating with the site of his pain.  Pain control has been an ongoing issue.  INTERVAL HISTORY: CT of neck with contrast on 07/30/2022 showed overall decreased swelling of the mucosal submucosal tissues consistent with resolving treatment effect.  No increasing density or enlarging lymph nodes.  CT of the chest on 09/13/2022 showed interval resolution of patchy groundglass opacities on previous PET/CT and no evidence of metastatic disease in chest.  Patient saw Dr. Janese Banks on 09/15/2022.  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  He had a CT of the neck on 12/7 at [facility] with note of ulceration of the left base of tongue at site of previously noted lesion but no  suspicious soft tissue thickening or enhancement.  No significant or suspicious lymphadenopathy noted.  This was categorized as NI-RADS 2a -low suspicion.  Patient returns to clinic today for follow-up.  Patient continues to endorse severe and persistent pain.  He admits to taking oxycodone more frequently than directed.  He says he has had to take several extra doses over the past couple days and is now out of oxycodone.  He says that he has also self increased his fentanyl and is using two 77mg patches.  He continues to use the gabapentin but says that the current regimen is not adequately controlling his pain.  Patient denies any adverse effects from pain medications.  He denies any illicit substance use.  Denies any neurologic complaints. Denies recent fevers or illnesses. Denies any easy bleeding or bruising. Reports fair appetite and denies weight loss. Denies chest pain. Denies any nausea, vomiting, constipation, or diarrhea. Denies urinary complaints. Patient offers no further specific complaints today.  SOCIAL HISTORY:     reports that he has been smoking cigarettes. He has a 17.50 pack-year smoking history. He has quit using smokeless tobacco. He reports that he does not currently use alcohol. He reports current drug use. Drugs: Marijuana, "Crack" cocaine, and Methamphetamines.  Patient recently incarcerated.  He is not employed.  ADVANCE DIRECTIVES:  Does not have  CODE STATUS:    PAST MEDICAL HISTORY: Past Medical History:  Diagnosis Date   Cancer associated pain    GERD (gastroesophageal reflux disease)    [place]     PAST SURGICAL HISTORY:  Past Surgical History:  Procedure Laterality Date   LARYNGOSCOPY Left 01/24/2022   Procedure: DIRECT LARYNGOSCOPY WITH  TONSIL BIOPSY;  Surgeon: Beverly Gust, MD;  Location: [facility] ORS;  Service: ENT;  Laterality: Left;   PORT-A-CATH REMOVAL N/A 02/04/2021   Procedure: REMOVAL PORT-A-CATH;  Surgeon: Olean Ree, MD;   Location: [facility] ORS;  Service: General;  Laterality: N/A;   PORTA CATH INSERTION N/A 10/25/2020   Procedure: PORTA CATH INSERTION;  Surgeon: Algernon Huxley, MD;  Location: [place] [facility];  Service: Cardiovascular;  Laterality: N/A;   PORTA CATH INSERTION N/A 02/09/2022   Procedure: PORTA CATH INSERTION;  Surgeon: Algernon Huxley, MD;  Location: [place] [facility];  Service: Cardiovascular;  Laterality: N/A;   REMOVAL OF GASTROSTOMY TUBE N/A 02/04/2021   Procedure: REMOVAL OF GASTROSTOMY TUBE;  Surgeon: Olean Ree, MD;  Location: [facility] ORS;  Service: General;  Laterality: N/A;   TONGUE BIOPSY Left 01/24/2022   Procedure: TONGUE BIOPSY;  Surgeon: Beverly Gust, MD;  Location: [facility] ORS;  Service: ENT;  Laterality: Left;    HEMATOLOGY/ONCOLOGY HISTORY:  Oncology History  Squamous cell carcinoma of oropharynx ([place])  10/18/2020 Initial Diagnosis   Squamous cell carcinoma of oropharynx ([place])   10/18/2020 Cancer Staging   Staging form: Pharynx - HPV-Mediated Oropharynx, AJCC 8th Edition - Clinical stage from 10/18/2020: Stage I (cT2, cN1, cM0) - Signed by Sindy Guadeloupe, MD on 10/18/2020   11/01/2020 - 12/20/2020 Chemotherapy   Patient is on Treatment Plan : HEAD/NECK Cisplatin q7d     02/13/2022 - 04/06/2022 Chemotherapy   Patient is on Treatment Plan : HEAD/NECK Cisplatin q7d       ALLERGIES:  has No Known Allergies.  MEDICATIONS:  Current Outpatient Medications  Medication Sig Dispense Refill   dexamethasone (DECADRON) 4 MG tablet Take 4 mg by mouth 2 (two) times daily.     DULoxetine (CYMBALTA) 30 MG capsule Take 30 mg by mouth daily. Taking with 60 mg to equal 90 mg     DULoxetine (CYMBALTA) 60 MG capsule Take 1 capsule (60 mg total) by mouth daily. 30 capsule 3   fentaNYL (DURAGESIC) 12 MCG/HR Place 1 patch onto the skin every 3 (three) days. 5 patch 0   fentaNYL (DURAGESIC) 12 MCG/HR Place 1 patch onto the skin every 3 (three) days. 5 patch 0   gabapentin (NEURONTIN) 800 MG tablet Take 800  mg by mouth 3 (three) times daily.     IBU 600 MG tablet Take 600 mg by mouth 3 (three) times daily.     lansoprazole (PREVACID) 30 MG capsule Take 1 capsule (30 mg total) by mouth daily at 12 noon. 30 capsule 3   lidocaine (XYLOCAINE) 2 % solution USE 15 MLS IN THE MOUTH OR THROAT EVERY 4 HOURS AS NEEDED FOR MOUTH PAIN AS DIRECTED 600 mL 0   magic mouthwash w/lidocaine SOLN Take 5 mLs by mouth 4 (four) times daily as needed for mouth pain. 210 mL 3   Oxycodone HCl 10 MG TABS TAKE ONE TABLET BY MOUTH 3 TIMES DAILY 42 tablet 0   Oxycodone HCl 20 MG TABS Take 0.5 tablets (10 mg total) by mouth 3 (three) times daily. Pharmacy does not have 10 mg so he will get 20 mg and pt needs to break tablet in half for each dose 21 tablet 0   Oxycodone HCl 20 MG TABS TAKE ONE-HALF (1/2) TABLET BY MOUTH 3 TIMES DAILY 21 tablet 0   sucralfate (CARAFATE) 1 g tablet Take 1 tablet (1 g total) by mouth 3 (three) times daily before meals. 90 tablet 6   Varenicline Tartrate,  Starter, 0.5 MG X 11 & 1 MG X 42 TBPK Take one 0.'5mg'$  tab once daily for 3 days,then increase to one 0.'5mg'$  tab twice daily for 4 days,then increase to one '1mg'$  tab twice daily.     calcium carbonate (TUMS - DOSED IN MG ELEMENTAL CALCIUM) 500 MG chewable tablet Chew 1 tablet by mouth as needed for indigestion or heartburn. (Patient not taking: Reported on 06/26/2022)     nicotine (NICODERM CQ - DOSED IN MG/24 HR) 7 mg/24hr patch 7 mg daily. (Patient not taking: Reported on 08/14/2022)     ondansetron (ZOFRAN) 8 MG tablet Take 1 tablet (8 mg total) by mouth 2 (two) times daily as needed. Start on the third day after cisplatin chemotherapy. (Patient not taking: Reported on 02/13/2022) 30 tablet 1   No current facility-administered medications for this visit.   Facility-Administered Medications Ordered in Other Visits  Medication Dose Route Frequency Provider Last Rate Last Admin   heparin lock flush 100 UNIT/ML injection            heparin lock flush 100  UNIT/ML injection            sodium chloride flush (NS) 0.9 % injection 10 mL  10 mL Intravenous PRN Sindy Guadeloupe, MD   10 mL at 04/05/22 1112    VITAL SIGNS: BP (!) 154/110 (Patient Position: Sitting)   Pulse (!) 115   Temp (!) 97 F (36.1 C) (Tympanic)   Resp 18   Wt 148 lb (67.1 kg)   SpO2 99%   BMI 22.50 kg/m  Filed Weights   10/25/22 1349  Weight: 148 lb (67.1 kg)    Estimated body mass index is 22.5 kg/m as calculated from the following:   Height as of 08/14/22: '5\' 8"'$  (1.727 m).   Weight as of this encounter: 148 lb (67.1 kg).  LABS: CBC:    Component Value Date/Time   WBC 9.6 10/25/2022 1329   HGB 14.0 10/25/2022 1329   HCT 40.5 10/25/2022 1329   PLT 309 10/25/2022 1329   MCV 87.7 10/25/2022 1329   NEUTROABS 8.0 (H) 10/25/2022 1329   LYMPHSABS 1.1 10/25/2022 1329   MONOABS 0.3 10/25/2022 1329   EOSABS 0.0 10/25/2022 1329   BASOSABS 0.0 10/25/2022 1329   Comprehensive Metabolic Panel:    Component Value Date/Time   NA 135 07/30/2022 0847   K 4.3 07/30/2022 0847   CL 99 07/30/2022 0847   CO2 27 07/30/2022 0847   BUN 14 07/30/2022 0847   CREATININE 0.74 07/30/2022 0847   GLUCOSE 126 (H) 07/30/2022 0847   CALCIUM 10.0 07/30/2022 0847   AST 17 07/30/2022 0847   ALT 13 07/30/2022 0847   ALKPHOS 92 07/30/2022 0847   BILITOT 0.6 07/30/2022 0847   PROT 8.9 (H) 07/30/2022 0847   ALBUMIN 4.7 07/30/2022 0847    RADIOGRAPHIC STUDIES: No results found.  PERFORMANCE STATUS (ECOG) : 1 - Symptomatic but completely ambulatory  Review of Systems Unless otherwise noted, a complete review of systems is negative.  Physical Exam General: NAD HEENT: large ulceration L. Tongue base Pulmonary: Unlabored Extremities: no edema, no joint deformities Skin: no rashes Neurological: Grossly nonfocal  IMPRESSION/PLAN: Neoplasm related pain -[facility].  He says he spoke with Dr. Ihor Austin earlier this week with plan for extensive surgery with date  TBD.  Patient says he was told that he likely will require hospitalization for several weeks and replacement of feeding tube.  Case discussed with Dr. Janese Banks.  Will proceed with refilling patient's pain medications and increase dose of fentanyl to 25 mcg to 72 hours.  Discussed with patient that he may require referral to pain clinic should pain persist chronically postoperatively in the absence of evidence of active cancer.  PDMP reviewed.  UDS obtained.  RTC in 1 month to see Dr. Janese Banks  Patient expressed understanding and was in agreement with this plan. He also understands that He can call clinic at any time with any questions, concerns, or complaints.   Thank you for allowing me to participate in the care of this very pleasant patient.   Time Total: 15 minutes  Visit consisted of counseling and education dealing with the complex and emotionally intense issues of symptom management in the setting of serious illness.Greater than 50%  of this time was spent counseling and coordinating care related to the above assessment and plan.  Signed by: Altha Harm, PhD, NP-C

## 2022-10-25 NOTE — Progress Notes (Signed)
Patient here for oncology follow-up appointment, expresses concerns of uncontrollable throat pain and constipation

## 2022-10-25 NOTE — Telephone Encounter (Signed)
Returned called regarding pain med refill for today. Since last call  pharmacist has talked to someone with [name] and its approved, nothing else needed

## 2022-10-30 ENCOUNTER — Ambulatory Visit: Payer: Medicaid Other | Admitting: Radiation Oncology

## 2022-10-31 DIAGNOSIS — Y842 Radiological procedure and radiotherapy as the cause of abnormal reaction of the patient, or of later complication, without mention of misadventure at the time of the procedure: Secondary | ICD-10-CM | POA: Insufficient documentation

## 2022-11-02 LAB — MISC LABCORP TEST (SEND OUT): Labcorp test code: 791194

## 2022-11-08 ENCOUNTER — Encounter: Payer: Self-pay | Admitting: Radiation Oncology

## 2022-11-08 ENCOUNTER — Other Ambulatory Visit: Payer: Self-pay | Admitting: *Deleted

## 2022-11-08 ENCOUNTER — Ambulatory Visit
Admission: RE | Admit: 2022-11-08 | Discharge: 2022-11-08 | Disposition: A | Discharge status: Home / Self Care | Payer: Medicaid Other | Source: Ambulatory Visit | Attending: Radiation Oncology | Admitting: Radiation Oncology

## 2022-11-08 VITALS — BP 145/80 | HR 84 | Temp 98.2°F | Resp 16 | Ht 68.5 in | Wt 140.0 lb

## 2022-11-08 DIAGNOSIS — C01 Malignant neoplasm of base of tongue: Secondary | ICD-10-CM | POA: Insufficient documentation

## 2022-11-08 DIAGNOSIS — Z923 Personal history of irradiation: Secondary | ICD-10-CM | POA: Insufficient documentation

## 2022-11-08 DIAGNOSIS — C109 Malignant neoplasm of oropharynx, unspecified: Secondary | ICD-10-CM

## 2022-11-08 MED ORDER — OXYCODONE HCL 10 MG PO TABS: ORAL_TABLET | ORAL | 0 refills | Status: DC

## 2022-11-08 MED ORDER — FENTANYL 25 MCG/HR TD PT72: 1.0000 | MEDICATED_PATCH | TRANSDERMAL | 0 refills | Status: DC

## 2022-11-08 MED ORDER — GABAPENTIN 800 MG PO TABS: 800.0000 mg | ORAL_TABLET | Freq: Three times a day (TID) | ORAL | 0 refills | Status: DC

## 2022-11-08 NOTE — Progress Notes (Signed)
Radiation Oncology Follow up Note  Name: Paul Hebert   Date:   11/08/2022 MRN:  [id number] DOB: 11/14/1970    This 52 y.o. 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 provider found  HPI: Patient is a 52year old male now out 5 months having completed salvage radiation therapy for stage IVa squamous cell carcinoma left tongue base..Marland Kitchen He continues to have significant pain in his oral cavity.  Follow-up examinations as well as PET and CT scans which I have reviewed showed no evidence of recurrent disease.  [facility] and [facility] some of his head and neck pain.  He is having no significant dysphagia at this time he does have a trach which is functioning well.  COMPLICATIONS OF TREATMENT: none  FOLLOW UP COMPLIANCE: keeps appointments   PHYSICAL EXAM:  BP (!) 145/80 (BP Location: Right Arm, Patient Position: Sitting, Cuff Size: Normal)   Pulse 84   Temp 98.2 F (36.8 C) (Tympanic)   Resp 16   Ht 5' 8.5" (1.74 m) Comment: stated ht  Wt 140 lb (63.5 kg)   BMI 20.98 kg/m  Oral cavity shows some granulation tissue in the left oral cavity.  Neck is clear without evidence of cervical or supraclavicular adenopathy.  TLurline [place] functioning well.  Well-developed well-nourished patient in NAD. HEENT reveals PERLA, EOMI, discs not visualized.  Oral cavity is clear. No oral mucosal lesions are identified. Neck is clear without evidence of cervical or supraclavicular adenopathy. Lungs are clear to A&P. Cardiac examination is essentially unremarkable with regular rate and rhythm without murmur rub or thrill. Abdomen is benign with no organomegaly or masses noted. Motor sensory and DTR levels are equal and symmetric in the upper and lower  extremities. Cranial nerves II through XII are grossly intact. Proprioception is intact. No peripheral adenopathy or edema is identified. No motor or sensory levels are noted. Crude visual fields are within normal range.  RADIOLOGY RESULTS: CT scans PET CT scans reviewed compatible with above-stated findings  PLAN: At [facility] to alleviate some of his head and neck pain.  Have asked to see him back in 6 months for follow-up.  Patient knows to call with any concerns at any time.  I would like to take this opportunity to thank you for allowing me to participate in the care of your patient..Noreene Filbert MD

## 2022-11-14 ENCOUNTER — Telehealth: Payer: Self-pay | Admitting: *Deleted

## 2022-11-14 NOTE — Telephone Encounter (Signed)
Opened telephone call in error

## 2022-11-17 ENCOUNTER — Inpatient Hospital Stay (HOSPITAL_BASED_OUTPATIENT_CLINIC_OR_DEPARTMENT_OTHER): Payer: Medicaid Other | Admitting: Oncology

## 2022-11-17 ENCOUNTER — Encounter: Payer: Self-pay | Admitting: Oncology

## 2022-11-17 ENCOUNTER — Inpatient Hospital Stay: Payer: Medicaid Other | Attending: Oncology

## 2022-11-17 VITALS — BP 125/91 | HR 93 | Temp 98.5°F | Resp 14 | Wt 141.0 lb

## 2022-11-17 DIAGNOSIS — G893 Neoplasm related pain (acute) (chronic): Secondary | ICD-10-CM | POA: Insufficient documentation

## 2022-11-17 DIAGNOSIS — F17210 Nicotine dependence, cigarettes, uncomplicated: Secondary | ICD-10-CM | POA: Diagnosis not present

## 2022-11-17 DIAGNOSIS — F1910 Other psychoactive substance abuse, uncomplicated: Secondary | ICD-10-CM | POA: Diagnosis not present

## 2022-11-17 DIAGNOSIS — Z79899 Other long term (current) drug therapy: Secondary | ICD-10-CM | POA: Insufficient documentation

## 2022-11-17 DIAGNOSIS — F1290 Cannabis use, unspecified, uncomplicated: Secondary | ICD-10-CM | POA: Diagnosis not present

## 2022-11-17 DIAGNOSIS — Z923 Personal history of irradiation: Secondary | ICD-10-CM | POA: Diagnosis not present

## 2022-11-17 DIAGNOSIS — Z85818 Personal history of malignant neoplasm of other sites of lip, oral cavity, and pharynx: Secondary | ICD-10-CM | POA: Insufficient documentation

## 2022-11-17 DIAGNOSIS — C109 Malignant neoplasm of oropharynx, unspecified: Secondary | ICD-10-CM | POA: Diagnosis not present

## 2022-11-17 DIAGNOSIS — Z9221 Personal history of antineoplastic chemotherapy: Secondary | ICD-10-CM | POA: Insufficient documentation

## 2022-11-17 LAB — COMPREHENSIVE METABOLIC PANEL WITH GFR
ALT: 14 U/L (ref 0–44)
AST: 18 U/L (ref 15–41)
Albumin: 3.9 g/dL (ref 3.5–5.0)
Alkaline Phosphatase: 60 U/L (ref 38–126)
Anion gap: 8 (ref 5–15)
BUN: 12 mg/dL (ref 6–20)
CO2: 27 mmol/L (ref 22–32)
Calcium: 9.1 mg/dL (ref 8.9–10.3)
Chloride: 103 mmol/L (ref 98–111)
Creatinine, Ser: 0.86 mg/dL (ref 0.61–1.24)
GFR, Estimated: >60 mL/min
Glucose, Bld: 101 mg/dL — ABNORMAL HIGH (ref 70–99)
Potassium: 4.3 mmol/L (ref 3.5–5.1)
Sodium: 138 mmol/L (ref 135–145)
Total Bilirubin: 0.4 mg/dL (ref 0.3–1.2)
Total Protein: 6.9 g/dL (ref 6.5–8.1)

## 2022-11-17 LAB — CBC WITH DIFFERENTIAL/PLATELET
Abs Immature Granulocytes: 0.01 10*3/uL (ref 0.00–0.07)
Basophils Absolute: 0 10*3/uL (ref 0.0–0.1)
Basophils Relative: 0 %
Eosinophils Absolute: 0.1 10*3/uL (ref 0.0–0.5)
Eosinophils Relative: 2 %
HCT: 40.2 % (ref 39.0–52.0)
Hemoglobin: 13.6 g/dL (ref 13.0–17.0)
Immature Granulocytes: 0 %
Lymphocytes Relative: 35 %
Lymphs Abs: 1.8 10*3/uL (ref 0.7–4.0)
MCH: 30.2 pg (ref 26.0–34.0)
MCHC: 33.8 g/dL (ref 30.0–36.0)
MCV: 89.1 fL (ref 80.0–100.0)
Monocytes Absolute: 0.6 10*3/uL (ref 0.1–1.0)
Monocytes Relative: 11 %
Neutro Abs: 2.6 10*3/uL (ref 1.7–7.7)
Neutrophils Relative %: 52 %
Platelets: 300 10*3/uL (ref 150–400)
RBC: 4.51 MIL/uL (ref 4.22–5.81)
RDW: 14.6 % (ref 11.5–15.5)
WBC: 5.2 10*3/uL (ref 4.0–10.5)
nRBC: 0 % (ref 0.0–0.2)

## 2022-11-17 NOTE — Progress Notes (Unsigned)
Pt in for follow up, suppose to have "surgery this month". Pt inquired when will port a cath be removed as well.  Pt states he drinks 4-5 ensure or boost daily.

## 2022-11-18 NOTE — Progress Notes (Signed)
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 Patient states that he presented with these complaints 6 months ago and had a CT soft tissue neck which did not show any malignancy.  He continued to have on and off pain which was gradually getting worse and he presented back to the ER on 09/27/2020.     CT soft tissue neck showed a left base of tongue mass measuring 3.4 x 2.6 x 3.3 cm which was invading the tongue along the anterior and medial margin and extending into the left vallecula.  Possible superior and posterior extension to involve the left palatine tonsil the lesion abuts the uvula with early extension into the soft palate.  Multiple left level 2 lymph nodes largest of which was 1.2 cm.  Additional necrotic limited to level 3 nodal conglomerate measures 1.3 cm.  No right-sided adenopathy.   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  PET CT scan Showed left base of tongue mass measuring about 2.8 cm with enlarged centrally necrotic level 2/3 left-sided and lymph nodes with an SUV about 3.7.  No hypermetabolic right neck nodes.  No findings of distant metastatic disease   Patient completed 5 cycles of weekly cisplatin chemotherapy between January to March 2022.  There were treatment interruptions due to mucositis.   He could not complete 7 cycles eventually.  He did complete his radiation treatment.   Towards the end of radiation patient was admitted for suicidal ideation.  At the end of chemoradiation patient did not have any evidence of residual disease.  However he was found to have biopsy-proven local recurrence in March 2023.     Patient completed second round of chemoradiation in late June 2023    Interval history-patient states that his pain is presently manageable with prn oxycodone and fentanyl patch but he still continues to have pain throughout the day making it difficult for him to eat or drink.  He is hoping to go ahead with surgery at [facility] this month  ECOG PS- 1 Pain scale- 7 Opioid associated constipation- no  Review of systems- Review of Systems  Constitutional:  Negative for chills, fever, malaise/fatigue and weight loss.  HENT:  Negative for congestion, ear discharge and nosebleeds.        Throat pain  Eyes:  Negative for blurred vision.  Respiratory:  Negative for cough, hemoptysis, sputum production, shortness of breath and wheezing.   Cardiovascular:  Negative for chest pain, palpitations, orthopnea and claudication.  Gastrointestinal:  Negative for abdominal pain, blood in stool, constipation, diarrhea, heartburn, melena, nausea and vomiting.  Genitourinary:  Negative for dysuria, flank pain, frequency, hematuria and urgency.  Musculoskeletal:  Negative for back pain, joint pain and myalgias.  Skin:  Negative for rash.  Neurological:  Negative for dizziness, tingling, focal weakness, seizures, weakness and headaches.  Endo/Heme/Allergies:  Does not bruise/bleed easily.  Psychiatric/Behavioral:  Negative for depression and suicidal ideas. The patient does not have insomnia.       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[facility])      Past Surgical History:  Procedure  Laterality Date   LARYNGOSCOPY Left 01/24/2022   Procedure: DIRECT LARYNGOSCOPY WITH TONSIL BIOPSY;  Surgeon: Beverly Gust, MD;  Location: [facility] ORS;  Service: ENT;  Laterality: Left;   PORT-A-CATH REMOVAL N/A 02/04/2021   Procedure: REMOVAL PORT-A-CATH;  Surgeon: Olean Ree, MD;  Location: [facility] ORS;  Service: General;  Laterality: N/A;   PORTA CATH INSERTION N/A 10/25/2020   Procedure: PORTA CATH INSERTION;  Surgeon: Algernon Huxley, MD;  Location: [place] [facility];  Service: Cardiovascular;  Laterality: N/A;   PORTA CATH INSERTION N/A 02/09/2022   Procedure: PORTA CATH INSERTION;  Surgeon: Algernon Huxley, MD;  Location: [place] [facility];  Service: Cardiovascular;  Laterality: N/A;   REMOVAL OF GASTROSTOMY TUBE N/A 02/04/2021   Procedure: REMOVAL OF GASTROSTOMY TUBE;  Surgeon: Olean Ree, MD;  Location: [facility] ORS;  Service: General;  Laterality: N/A;   TONGUE BIOPSY Left 01/24/2022   Procedure: TONGUE BIOPSY;  Surgeon: Beverly Gust, MD;  Location: [facility] ORS;  Service: ENT;  Laterality: Left;    Social History   Socioeconomic History   Marital status: Single    Spouse name: Not on file   Number of children: Not on file   Years of education: Not on file   Highest education level: Not on file  Occupational History   Not on file  Tobacco Use   Smoking status: Every Day    Packs/day: 0.50    Years: 35.00    Total pack years: 17.50    Types: Cigarettes   Smokeless tobacco: Former   Tobacco comments:    Patches very rarely  Scientific laboratory technician Use: Former  Substance and Sexual Activity   Alcohol use: Not Currently   Drug use: Yes    Types: Marijuana, "Crack" cocaine, Methamphetamines    Comment: marijuana every day except the joint   Sexual activity: Not Currently  Other Topics Concern   Not on file  Social History Narrative   Not on file   Social Determinants of Health   Financial Resource Strain: Not on file  Food Insecurity: Not on file  Transportation Needs:  Not on file  Physical Activity: Not on file  Stress: Not on file  Social Connections: Not on file  Intimate Partner Violence: Not on file    Family History  Problem Relation Age of Onset   Cancer Father      Current Outpatient Medications:    calcium carbonate (TUMS - DOSED IN MG ELEMENTAL CALCIUM) 500 MG chewable tablet, Chew 1 tablet by mouth as needed for indigestion or heartburn., Disp: , Rfl:    dexamethasone (DECADRON) 4 MG tablet, Take 4 mg by mouth 2 (two) times daily., Disp: , Rfl:    DULoxetine (CYMBALTA) 30 MG capsule, Take 30 mg by mouth daily. Taking with 60 mg to equal 90 mg, Disp: , Rfl:    DULoxetine (CYMBALTA) 60 MG capsule, Take 1 capsule (60 mg total) by mouth daily., Disp: 30 capsule, Rfl: 3   fentaNYL (DURAGESIC) 25 MCG/HR, Place 1 patch onto the skin every 3 (three) days., Disp: 5 patch, Rfl: 0   gabapentin (NEURONTIN) 800 MG tablet, Take 1 tablet (  800 mg total) by mouth 3 (three) times daily., Disp: 42 tablet, Rfl: 0   IBU 600 MG tablet, Take 600 mg by mouth 3 (three) times daily., Disp: , Rfl:    lansoprazole (PREVACID) 30 MG capsule, Take 1 capsule (30 mg total) by mouth daily at 12 noon., Disp: 30 capsule, Rfl: 3   lidocaine (XYLOCAINE) 2 % solution, USE 15 MLS IN THE MOUTH OR THROAT EVERY 4 HOURS AS NEEDED FOR MOUTH PAIN AS DIRECTED, Disp: 600 mL, Rfl: 0   Oxycodone HCl 10 MG TABS, TAKE ONE TABLET BY MOUTH 3 TIMES DAILY, Disp: 42 tablet, Rfl: 0   magic mouthwash w/lidocaine SOLN, Take 5 mLs by mouth 4 (four) times daily as needed for mouth pain. (Patient not taking: Reported on 11/17/2022), Disp: 210 mL, Rfl: 3   nicotine (NICODERM CQ - DOSED IN MG/24 HR) 7 mg/24hr patch, 7 mg daily. (Patient not taking: Reported on 08/14/2022), Disp: , Rfl:    ondansetron (ZOFRAN) 8 MG tablet, Take 1 tablet (8 mg total) by mouth 2 (two) times daily as needed. Start on the third day after cisplatin chemotherapy. (Patient not taking: Reported on 02/13/2022), Disp: 30 tablet, Rfl: 1    sucralfate (CARAFATE) 1 g tablet, Take 1 tablet (1 g total) by mouth 3 (three) times daily before meals. (Patient not taking: Reported on 11/17/2022), Disp: 90 tablet, Rfl: 6   Varenicline Tartrate, Starter, 0.5 MG X 11 & 1 MG X 42 TBPK, Take one 0.'5mg'$  tab once daily for 3 days,then increase to one 0.'5mg'$  tab twice daily for 4 days,then increase to one '1mg'$  tab twice daily. (Patient not taking: Reported on 11/17/2022), Disp: , Rfl:  No current facility-administered medications for this visit.  Facility-Administered Medications Ordered in Other Visits:    heparin lock flush 100 UNIT/ML injection, , , ,    heparin lock flush 100 UNIT/ML injection, , , ,    sodium chloride flush (NS) 0.9 % injection 10 mL, 10 mL, Intravenous, PRN, Sindy Guadeloupe, MD, 10 mL at 04/05/22 1112  Physical exam:  Vitals:   11/17/22 1441  BP: (!) 125/91  Pulse: 93  Resp: 14  Temp: 98.5 F (36.9 C)  TempSrc: Tympanic  SpO2: 100%  Weight: 141 lb (64 kg)   Physical Exam Cardiovascular:     Rate and Rhythm: Normal rate and regular rhythm.     Heart sounds: Normal heart sounds.  Pulmonary:     Effort: Pulmonary effort is normal.     Breath sounds: Normal breath sounds.  Skin:    General: Skin is warm and dry.  Neurological:     Mental Status: He is alert and oriented to person, place, and time.         Latest Ref Rng & Units 11/17/2022    2:05 PM  CMP  Glucose 70 - 99 mg/dL 101   BUN 6 - 20 mg/dL 12   Creatinine 0.61 - 1.24 mg/dL 0.86   Sodium 135 - 145 mmol/L 138   Potassium 3.5 - 5.1 mmol/L 4.3   Chloride 98 - 111 mmol/L 103   CO2 22 - 32 mmol/L 27   Calcium 8.9 - 10.3 mg/dL 9.1   Total Protein 6.5 - 8.1 g/dL 6.9   Total Bilirubin 0.3 - 1.2 mg/dL 0.4   Alkaline Phos 38 - 126 U/L 60   AST 15 - 41 U/L 18   ALT 0 - 44 U/L 14       Latest Ref Rng &  Units 11/17/2022    2:05 PM  CBC  WBC 4.0 - 10.5 K/uL 5.2   Hemoglobin 13.0 - 17.0 g/dL 13.6   Hematocrit 39.0 - 52.0 % 40.2   Platelets 150 - 400 K/uL 300       Assessment and plan- Patient is a 52 y.o. 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  He is s/p 5 cycles of weekly cisplatin chemotherapy with concurrent radiation treatment.  Chemo was stopped prematurely due to worsening mucositis and patient completed radiation treatment in April 2022.  He now has noted to have  biopsy-proven locally recurrent disease.  He completed second round of concurrent chemoradiation in June 2023.  This is a routine f/u visit   Dr. Frazier Butt is planning glossectomy with radical neck dissection to address his ongoing pain tentatively on 12/19/2022.  At this point it is unclear if the pain is secondary to any residual cancer versus side effects of prior radiation.  Biopsy x 1 was treated for malignancy.  Patient wishes to proceed with surgery at this time.  For now he will stay on the fentanyl patch and as needed oxycodone and we will refill his medications when he is due for it   Visit Diagnosis 1. Squamous cell carcinoma of oropharynx ([place])   2. Polysubstance abuse ([place])   3. Neoplasm related pain      Dr. Randa Evens, MD, MPH [facility] [phone] 11/18/2022 6:23 PM

## 2022-11-22 ENCOUNTER — Other Ambulatory Visit: Payer: Self-pay | Admitting: *Deleted

## 2022-11-22 MED ORDER — OXYCODONE HCL 10 MG PO TABS: ORAL_TABLET | ORAL | 0 refills | Status: DC

## 2022-11-22 MED ORDER — GABAPENTIN 800 MG PO TABS: 800.0000 mg | ORAL_TABLET | Freq: Three times a day (TID) | ORAL | 0 refills | Status: DC

## 2022-11-22 MED ORDER — FENTANYL 25 MCG/HR TD PT72: 1.0000 | MEDICATED_PATCH | TRANSDERMAL | 0 refills | Status: DC

## 2022-11-22 MED ORDER — LIDOCAINE VISCOUS HCL 2 % MT SOLN: OROMUCOSAL | 0 refills | Status: DC

## 2022-11-24 ENCOUNTER — Telehealth: Payer: Self-pay | Admitting: Hospice and Palliative Medicine

## 2022-11-24 ENCOUNTER — Other Ambulatory Visit: Payer: Self-pay | Admitting: *Deleted

## 2022-11-24 DIAGNOSIS — F1910 Other psychoactive substance abuse, uncomplicated: Secondary | ICD-10-CM

## 2022-11-24 DIAGNOSIS — G893 Neoplasm related pain (acute) (chronic): Secondary | ICD-10-CM

## 2022-11-24 DIAGNOSIS — C109 Malignant neoplasm of oropharynx, unspecified: Secondary | ICD-10-CM

## 2022-11-24 LAB — MISC LABCORP TEST (SEND OUT): Labcorp test code: 791194

## 2022-11-24 NOTE — Telephone Encounter (Signed)
Unable to reach patient by phone. UDS + for amphetamine and cocaine metabolites.  I spoke with [facility] toxicologist who confirmed that viscous lidocaine would not result in positive cocaine metabolites.  Case discussed with Dr. Janese Banks and patient referred to [facility] pain clinic given expectation that he will likely have chronic pain that would be best served in a specialized pain clinic.

## 2022-12-06 ENCOUNTER — Telehealth: Payer: Self-pay | Admitting: *Deleted

## 2022-12-06 ENCOUNTER — Other Ambulatory Visit: Payer: Self-pay | Admitting: *Deleted

## 2022-12-06 MED ORDER — OXYCODONE HCL 10 MG PO TABS: ORAL_TABLET | ORAL | 0 refills | Status: DC

## 2022-12-06 MED ORDER — FENTANYL 25 MCG/HR TD PT72: 1.0000 | MEDICATED_PATCH | TRANSDERMAL | 0 refills | Status: DC

## 2022-12-06 MED ORDER — GABAPENTIN 800 MG PO TABS: 800.0000 mg | ORAL_TABLET | Freq: Three times a day (TID) | ORAL | 0 refills | Status: DC

## 2022-12-06 NOTE — Telephone Encounter (Signed)
Patient wants Dr Janese Banks to know that he is having his surgery with Dr Frazier Butt at [facility] on 12/19/22

## 2022-12-25 ENCOUNTER — Other Ambulatory Visit: Payer: Self-pay | Admitting: *Deleted

## 2022-12-25 MED ORDER — FENTANYL 25 MCG/HR TD PT72: 1.0000 | MEDICATED_PATCH | TRANSDERMAL | 0 refills | Status: DC

## 2022-12-25 NOTE — Telephone Encounter (Signed)
Received [facility]- requesting a RF on fentanyl.  I reviewed pt's chart- pt has not had his surgery at [facility] per charting at [facility]- "Pt. States he has spoken with Dr. Ihor Austin. He was told if he had second thoughts he could put it off. He wants to reschedule at a later time."  Merrily Pew will renew script for fentanyl and will discuss pt's care with Dr. Janese Banks.

## 2023-01-03 ENCOUNTER — Other Ambulatory Visit: Payer: Self-pay | Admitting: Oncology

## 2023-01-05 ENCOUNTER — Inpatient Hospital Stay: Payer: Medicaid Other | Attending: Oncology | Admitting: Oncology

## 2023-01-17 ENCOUNTER — Inpatient Hospital Stay: Payer: Medicaid Other | Attending: Oncology

## 2023-01-17 ENCOUNTER — Encounter: Payer: Self-pay | Admitting: Oncology

## 2023-01-17 ENCOUNTER — Inpatient Hospital Stay (HOSPITAL_BASED_OUTPATIENT_CLINIC_OR_DEPARTMENT_OTHER): Payer: Medicaid Other | Admitting: Oncology

## 2023-01-17 VITALS — BP 146/93 | HR 91 | Temp 97.9°F | Resp 18 | Ht 68.0 in | Wt 133.2 lb

## 2023-01-17 DIAGNOSIS — Z79899 Other long term (current) drug therapy: Secondary | ICD-10-CM | POA: Diagnosis not present

## 2023-01-17 DIAGNOSIS — G894 Chronic pain syndrome: Secondary | ICD-10-CM | POA: Diagnosis not present

## 2023-01-17 DIAGNOSIS — C109 Malignant neoplasm of oropharynx, unspecified: Secondary | ICD-10-CM

## 2023-01-17 DIAGNOSIS — Z923 Personal history of irradiation: Secondary | ICD-10-CM | POA: Insufficient documentation

## 2023-01-17 DIAGNOSIS — F1910 Other psychoactive substance abuse, uncomplicated: Secondary | ICD-10-CM

## 2023-01-17 DIAGNOSIS — Z85818 Personal history of malignant neoplasm of other sites of lip, oral cavity, and pharynx: Secondary | ICD-10-CM | POA: Insufficient documentation

## 2023-01-17 DIAGNOSIS — F1290 Cannabis use, unspecified, uncomplicated: Secondary | ICD-10-CM | POA: Diagnosis not present

## 2023-01-17 LAB — COMPREHENSIVE METABOLIC PANEL
ALT: 15 U/L (ref 0–44)
AST: 22 U/L (ref 15–41)
Albumin: 4.7 g/dL (ref 3.5–5.0)
Alkaline Phosphatase: 67 U/L (ref 38–126)
Anion gap: 9 (ref 5–15)
BUN: 13 mg/dL (ref 6–20)
CO2: 27 mmol/L (ref 22–32)
Calcium: 9.2 mg/dL (ref 8.9–10.3)
Chloride: 103 mmol/L (ref 98–111)
Creatinine, Ser: 0.81 mg/dL (ref 0.61–1.24)
GFR, Estimated: >60 mL/min (ref >60)
Glucose, Bld: 84 mg/dL (ref 70–99)
Potassium: 3.9 mmol/L (ref 3.5–5.1)
Sodium: 139 mmol/L (ref 135–145)
Total Bilirubin: 0.5 mg/dL (ref 0.3–1.2)
Total Protein: 7.9 g/dL (ref 6.5–8.1)

## 2023-01-17 LAB — CBC WITH DIFFERENTIAL/PLATELET
Abs Immature Granulocytes: 0.01 10*3/uL (ref 0.00–0.07)
Basophils Absolute: 0 10*3/uL (ref 0.0–0.1)
Basophils Relative: 1 %
Eosinophils Absolute: 0.1 10*3/uL (ref 0.0–0.5)
Eosinophils Relative: 2 %
HCT: 39.5 % (ref 39.0–52.0)
Hemoglobin: 13.6 g/dL (ref 13.0–17.0)
Immature Granulocytes: 0 %
Lymphocytes Relative: 33 %
Lymphs Abs: 1.9 10*3/uL (ref 0.7–4.0)
MCH: 30.6 pg (ref 26.0–34.0)
MCHC: 34.4 g/dL (ref 30.0–36.0)
MCV: 88.8 fL (ref 80.0–100.0)
Monocytes Absolute: 0.5 10*3/uL (ref 0.1–1.0)
Monocytes Relative: 9 %
Neutro Abs: 3.3 10*3/uL (ref 1.7–7.7)
Neutrophils Relative %: 55 %
Platelets: 233 10*3/uL (ref 150–400)
RBC: 4.45 MIL/uL (ref 4.22–5.81)
RDW: 13.8 % (ref 11.5–15.5)
WBC: 5.9 10*3/uL (ref 4.0–10.5)
nRBC: 0 % (ref 0.0–0.2)

## 2023-01-17 MED ORDER — GABAPENTIN 800 MG PO TABS: 800.0000 mg | ORAL_TABLET | Freq: Three times a day (TID) | ORAL | 0 refills | Status: DC

## 2023-01-17 MED ORDER — OXYCODONE HCL 10 MG PO TABS: ORAL_TABLET | ORAL | 0 refills | Status: DC

## 2023-01-17 NOTE — Progress Notes (Signed)
Patient states that his face mouth and throat hurts and he wants to know when the pain will stop.

## 2023-01-22 ENCOUNTER — Telehealth: Payer: Self-pay | Admitting: *Deleted

## 2023-01-22 LAB — MISC LABCORP TEST (SEND OUT): Labcorp test code: 791194

## 2023-01-22 NOTE — Telephone Encounter (Signed)
-----   Message from Creig Hines, MD sent at 01/20/2023  9:47 AM EDT ----- Please let him know- his urine again tested positive for cocaine for last 2 times. It is possible that the street marijuana he uses is laced with that. But I will not be able to prescribe pain meds for him if this continues. He needs to stop using marijuana in that case

## 2023-01-22 NOTE — Telephone Encounter (Signed)
Called pt and got his voice mail and left message that dr Smith Robert got the results of drug test and it was positive for cocaine. Maybe the marajuana is being laced with cocaine. She will not be able to continue to giving pain meds if cocaine is posisitve. Please call us back and I gave my direct number

## 2023-01-26 ENCOUNTER — Other Ambulatory Visit: Payer: Self-pay | Admitting: Oncology
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
## 2023-02-14 ENCOUNTER — Encounter: Payer: Self-pay | Admitting: Oncology

## 2023-02-14 ENCOUNTER — Inpatient Hospital Stay: Payer: Medicaid Other | Attending: Oncology | Admitting: Oncology

## 2023-02-19 ENCOUNTER — Telehealth: Payer: Self-pay

## 2023-02-19 NOTE — Telephone Encounter (Signed)
Called [facility] pain clinic who stated referral is still pending. Stated demographics and more info is needed to why referral was entered. Rep is faxing over a referral sheet to be filled out.

## 2023-02-22 ENCOUNTER — Telehealth: Payer: Self-pay | Admitting: *Deleted

## 2023-02-22 NOTE — Telephone Encounter (Signed)
The person on the phone says that the still need the reason why pt needs to come to pain clinic. I told her that the patient has pain all the time from his tongue cancer and the medication oxycodone, fentanyl, and duloxetine does not help his pain. He had surgery at [facility] 5/2. We assume that he has pain from that. Wanted to see if there is any things pain management to help aleve his pain. Also to let them know that pt is on and off cocaine, and [email or web address].  If the pt. Is positive for cocaine on urine sample he does not get pain meds. I checked the list of pain clinic of all the parts. The fax went through.

## 2023-04-02 ENCOUNTER — Telehealth: Payer: Self-pay

## 2023-04-02 NOTE — Telephone Encounter (Signed)
I will take care of this Dr. Smith Robert

## 2023-04-02 NOTE — Telephone Encounter (Signed)
Looks like he did not land up having surgery. [facility]. Can you please send referral there?

## 2023-04-02 NOTE — Telephone Encounter (Signed)
Patient called stating he is still having mouth sores. Patient states the pain has lessened and he can eat a little better. Patient states he has moved to [facility] area is wondering if MD has any recommendations of a cancer doctor out there that she can refer him too.

## 2023-04-03 NOTE — Telephone Encounter (Signed)
Left vm for patient- requesting a return phone call to discuss his referral.

## 2023-04-03 NOTE — Telephone Encounter (Signed)
Unable to reach patient via his phone number provided. I reviewed his chart in care everywhere. He also attempt to call [facility] this week as well and gave [facility] the number of [phone]. I called this number as well. I personally left a vm that we are "trying to reach Mr. Paul Hebert and are requesting a call back to Dr. Assunta Gambles office at [phone]."  The [facility] are located in [facility]- near [employer] and [employer]. Looks like pt still has [place] medicaid. I'm not certain this cancer center will even take his insurance.  Looks like there is a cancer center in [place] at [facility]. Before I route referrals to any facility. I need to speak to patient to see his preference. Either way, he is looking to travel an hour+ to his apts.

## 2023-04-10 NOTE — Telephone Encounter (Signed)
Sherry- fyi- I have attempted multiple times to contact patient. He doesn't answer the phone.

## 2023-05-09 ENCOUNTER — Ambulatory Visit: Payer: MEDICAID | Attending: Radiation Oncology | Admitting: Radiation Oncology

## 2023-05-21 ENCOUNTER — Emergency Department: Payer: MEDICAID

## 2023-05-21 ENCOUNTER — Other Ambulatory Visit: Payer: Self-pay

## 2023-05-21 ENCOUNTER — Emergency Department
Admission: EM | Admit: 2023-05-21 | Discharge: 2023-05-21 | Disposition: A | Discharge status: Home / Self Care | Payer: MEDICAID | Attending: Emergency Medicine | Admitting: Emergency Medicine

## 2023-05-21 DIAGNOSIS — E876 Hypokalemia: Secondary | ICD-10-CM | POA: Diagnosis not present

## 2023-05-21 DIAGNOSIS — S40212A Abrasion of left shoulder, initial encounter: Secondary | ICD-10-CM | POA: Diagnosis not present

## 2023-05-21 DIAGNOSIS — S0993XA Unspecified injury of face, initial encounter: Secondary | ICD-10-CM

## 2023-05-21 DIAGNOSIS — S0081XA Abrasion of other part of head, initial encounter: Secondary | ICD-10-CM | POA: Diagnosis not present

## 2023-05-21 DIAGNOSIS — S4992XA Unspecified injury of left shoulder and upper arm, initial encounter: Secondary | ICD-10-CM | POA: Diagnosis present

## 2023-05-21 LAB — CBC
HCT: 40.7 % (ref 39.0–52.0)
Hemoglobin: 14.2 g/dL (ref 13.0–17.0)
MCH: 32.8 pg (ref 26.0–34.0)
MCHC: 34.9 g/dL (ref 30.0–36.0)
MCV: 94 fL (ref 80.0–100.0)
Platelets: 225 10*3/uL (ref 150–400)
RBC: 4.33 MIL/uL (ref 4.22–5.81)
RDW: 13.2 % (ref 11.5–15.5)
WBC: 6 10*3/uL (ref 4.0–10.5)
nRBC: 0 % (ref 0.0–0.2)

## 2023-05-21 LAB — COMPREHENSIVE METABOLIC PANEL
ALT: 21 U/L (ref 0–44)
AST: 35 U/L (ref 15–41)
Albumin: 4.1 g/dL (ref 3.5–5.0)
Alkaline Phosphatase: 62 U/L (ref 38–126)
Anion gap: 15 (ref 5–15)
BUN: 13 mg/dL (ref 6–20)
CO2: 17 mmol/L — ABNORMAL LOW (ref 22–32)
Calcium: 9.1 mg/dL (ref 8.9–10.3)
Chloride: 104 mmol/L (ref 98–111)
Creatinine, Ser: 0.93 mg/dL (ref 0.61–1.24)
GFR, Estimated: >60 mL/min (ref >60)
Glucose, Bld: 86 mg/dL (ref 70–99)
Potassium: 3.3 mmol/L — ABNORMAL LOW (ref 3.5–5.1)
Sodium: 136 mmol/L (ref 135–145)
Total Bilirubin: 1 mg/dL (ref 0.3–1.2)
Total Protein: 7.2 g/dL (ref 6.5–8.1)

## 2023-05-21 LAB — CBG MONITORING, ED: Glucose-Capillary: 95 mg/dL (ref 70–99)

## 2023-05-21 NOTE — ED Notes (Signed)
Patient refused Chest Xray.
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
## 2023-07-16 ENCOUNTER — Inpatient Hospital Stay: Payer: MEDICAID | Attending: Oncology | Admitting: Oncology

## 2023-07-16 ENCOUNTER — Encounter: Payer: Self-pay | Admitting: Oncology

## 2023-07-16 VITALS — BP 127/86 | HR 81 | Temp 99.2°F | Resp 18 | Ht 69.0 in | Wt 155.7 lb

## 2023-07-16 DIAGNOSIS — Z9221 Personal history of antineoplastic chemotherapy: Secondary | ICD-10-CM | POA: Diagnosis not present

## 2023-07-16 DIAGNOSIS — Z8589 Personal history of malignant neoplasm of other organs and systems: Secondary | ICD-10-CM | POA: Diagnosis not present

## 2023-07-16 DIAGNOSIS — Z923 Personal history of irradiation: Secondary | ICD-10-CM | POA: Diagnosis not present

## 2023-07-16 DIAGNOSIS — C108 Malignant neoplasm of overlapping sites of oropharynx: Secondary | ICD-10-CM | POA: Insufficient documentation

## 2023-07-16 DIAGNOSIS — Z79899 Other long term (current) drug therapy: Secondary | ICD-10-CM | POA: Diagnosis not present

## 2023-07-16 DIAGNOSIS — K121 Other forms of stomatitis: Secondary | ICD-10-CM | POA: Insufficient documentation

## 2023-07-16 DIAGNOSIS — G894 Chronic pain syndrome: Secondary | ICD-10-CM | POA: Diagnosis not present

## 2023-07-19 NOTE — Progress Notes (Signed)
Hematology/Oncology Consult note [facility]  Telephone:([phone] [phone]  Patient Care Team: System, Provider Not In as PCP - General Carmina Miller, MD as Referring Physician (Radiation Oncology) Creig Hines, MD as Consulting Physician (Oncology) Leafy Ro, MD as Consulting Physician (General Surgery)   Name of the patient: Paul Hebert  [id number]  10/05/1971   Date of visit: 07/19/23  Diagnosis- h/o HPV positive SCC of oropharynx currently in remission  Chief complaint/ Reason for visit- routine f/u regarding chronic throat pain  Heme/Onc history: patient is a 52 year old male who has been having ongoing throat pain for over 6 months. Patient states that he presented with these complaints 6 months ago and had a CT soft tissue neck which did not show any malignancy.  He continued to have on and off pain which was gradually getting worse and he presented back to the ER on 09/27/2020.     CT soft tissue neck showed a left base of tongue mass measuring 3.4 x 2.6 x 3.3 cm which was invading the tongue along the anterior and medial margin and extending into the left vallecula.  Possible superior and posterior extension to involve the left palatine tonsil the lesion abuts the uvula with early extension into the soft palate.  Multiple left level 2 lymph nodes largest of which was 1.2 cm.  Additional necrotic limited to level 3 nodal conglomerate measures 1.3 cm.  No right-sided adenopathy.   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  PET CT scan Showed left base of tongue mass measuring about 2.8 cm with enlarged centrally necrotic level 2/3 left-sided and lymph nodes with an SUV about 3.7.  No hypermetabolic right neck nodes.  No findings of distant metastatic disease   Patient completed 5 cycles of weekly cisplatin chemotherapy between January to March 2022.  There were treatment interruptions due to mucositis.  He could not complete  7 cycles eventually.  He did complete his radiation treatment.   Towards the end of radiation patient was admitted for suicidal ideation.  At the end of chemoradiation patient did not have any evidence of residual disease.  However he was found to have biopsy-proven local recurrence in March 2023.     Patient completed second round of chemoradiation in late June 2023. 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 Biopsy was negative for malignancy and scans have not shown any recurrence. He was supposed to get extensive reconstructive surgery at [facility] to deal with ulceration but ultimately patient decided to not move forward with surgery. He had tracheostomy placed when he presented with bleeding from the ulceration site. Patient took out tracheostomy himself  Interval history- Patient is currently incarcerated. He has not smoked or consumed alcohol. He feels better with that and has actually gained weight. He hopes to get out of prison by Jan 2025 and move to [place] to be with his girlfriend.   He has not been able to get any pain meds or gabapentin or magic mouth wash at the prison facility. He is on duloxetine dose unclear.   ECOG PS- 1 Pain scale- 6 Opioid associated constipation- no  Review of systems- Review of Systems  Constitutional:  Negative for chills, fever, malaise/fatigue and weight loss.  HENT:  Positive for sore throat. Negative for congestion, ear discharge and nosebleeds.   Eyes:  Negative for blurred vision.  Respiratory:  Negative for cough, hemoptysis, sputum production, shortness  of breath and wheezing.   Cardiovascular:  Negative for chest pain, palpitations, orthopnea and claudication.  Gastrointestinal:  Negative for abdominal pain, blood in stool, constipation, diarrhea, heartburn, melena, nausea and vomiting.  Genitourinary:  Negative for dysuria, flank pain, frequency, hematuria and urgency.   Musculoskeletal:  Negative for back pain, joint pain and myalgias.  Skin:  Negative for rash.  Neurological:  Negative for dizziness, tingling, focal weakness, seizures, weakness and headaches.  Endo/Heme/Allergies:  Does not bruise/bleed easily.  Psychiatric/Behavioral:  Negative for depression and suicidal ideas. The patient does not have insomnia.       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[facility])      Past Surgical History:  Procedure Laterality Date   LARYNGOSCOPY Left 01/24/2022   Procedure: DIRECT LARYNGOSCOPY WITH TONSIL BIOPSY;  Surgeon: Linus Salmons, MD;  Location: [facility] ORS;  Service: ENT;  Laterality: Left;   PORT-A-CATH REMOVAL N/A 02/04/2021   Procedure: REMOVAL PORT-A-CATH;  Surgeon: Henrene Dodge, MD;  Location: [facility] ORS;  Service: General;  Laterality: N/A;   PORTA CATH INSERTION N/A 10/25/2020   Procedure: PORTA CATH INSERTION;  Surgeon: Annice Needy, MD;  Location: [facility] INVASIVE CV LAB;  Service: Cardiovascular;  Laterality: N/A;   PORTA CATH INSERTION N/A 02/09/2022   Procedure: PORTA CATH INSERTION;  Surgeon: Annice Needy, MD;  Location: [facility] INVASIVE CV LAB;  Service: Cardiovascular;  Laterality: N/A;   REMOVAL OF GASTROSTOMY TUBE N/A 02/04/2021   Procedure: REMOVAL OF GASTROSTOMY TUBE;  Surgeon: Henrene Dodge, MD;  Location: [facility] ORS;  Service: General;  Laterality: N/A;   TONGUE BIOPSY Left 01/24/2022   Procedure: TONGUE BIOPSY;  Surgeon: Linus Salmons, MD;  Location: [facility] ORS;  Service: ENT;  Laterality: Left;    Social History   Socioeconomic History   Marital status: Single    Spouse name: Not on file   Number of children: Not on file   Years of education: Not on file   Highest education level: Not on file  Occupational History   Not on file  Tobacco Use   Smoking status: Every Day    Current packs/day: 0.50    Average packs/day: 0.5  packs/day for 35.0 years (17.5 ttl pk-yrs)    Types: Cigarettes   Smokeless tobacco: Former   Tobacco comments:    Patches very rarely  Vaping Use   Vaping status: Former  Substance and Sexual Activity   Alcohol use: Not Currently   Drug use: Yes    Types: Marijuana, "Crack" cocaine, Methamphetamines    Comment: marijuana every day except the joint   Sexual activity: Not Currently  Other Topics Concern   Not on file  Social History Narrative   Not on file   Social Determinants of Health   Financial Resource Strain: Medium Risk (06/13/2022)   [facility]   Overall Financial Resource Strain (CARDIA)    Difficulty of Paying Living Expenses: Somewhat hard  Food Insecurity: Food Insecurity Present (01/31/2023)   Received from [facility]   Hunger Vital Sign    Worried About Running Out of Food in the Last Year: Sometimes true    Ran Out of Food in the Last Year: Not on file  Transportation Needs: No Transportation Needs (06/13/2022)   Received from [facility]   [facility] - Transportation  Lack of Transportation (Medical): No    Lack of Transportation (Non-Medical): No  Physical Activity: Not on file  Stress: Not on file  Social Connections: Not on file  Intimate Partner Violence: Not on file    Family History  Problem Relation Age of Onset   Cancer Father      Current Outpatient Medications:    calcium carbonate (TUMS - DOSED IN MG ELEMENTAL CALCIUM) 500 MG chewable tablet, Chew 1 tablet by mouth as needed for indigestion or heartburn., Disp: , Rfl:    dexamethasone (DECADRON) 4 MG tablet, Take 4 mg by mouth 2 (two) times daily., Disp: , Rfl:    DULoxetine (CYMBALTA) 30 MG capsule, Take 30 mg by mouth daily. Taking with 60 mg to equal 90 mg, Disp: , Rfl:    fentaNYL (DURAGESIC) 25 MCG/HR, Place 1 patch onto the skin every 3 (three) days., Disp: 5 patch, Rfl: 0   gabapentin (NEURONTIN) 800 MG tablet, TAKE 1 TABLET  BY MOUTH 3 TIMES DAILY., Disp: 42 tablet, Rfl: 0   IBU 600 MG tablet, Take 600 mg by mouth 3 (three) times daily., Disp: , Rfl:    lansoprazole (PREVACID) 30 MG capsule, Take 1 capsule (30 mg total) by mouth daily at 12 noon., Disp: 30 capsule, Rfl: 3   lidocaine (XYLOCAINE) 2 % solution, USE 15 MLS IN THE MOUTH OR THROAT EVERY 4 HOURS AS NEEDED FOR MOUTH PAIN AS DIRECTED, Disp: 600 mL, Rfl: 0   magic mouthwash w/lidocaine SOLN, Take 5 mLs by mouth 4 (four) times daily as needed for mouth pain., Disp: 210 mL, Rfl: 3   nicotine (NICODERM CQ - DOSED IN MG/24 HR) 7 mg/24hr patch, 7 mg daily., Disp: , Rfl:    ondansetron (ZOFRAN) 8 MG tablet, Take 1 tablet (8 mg total) by mouth 2 (two) times daily as needed. Start on the third day after cisplatin chemotherapy., Disp: 30 tablet, Rfl: 1   Oxycodone HCl 10 MG TABS, TAKE ONE TABLET BY MOUTH 3 TIMES DAILY, Disp: 42 tablet, Rfl: 0   sucralfate (CARAFATE) 1 g tablet, Take 1 tablet (1 g total) by mouth 3 (three) times daily before meals., Disp: 90 tablet, Rfl: 6   DULoxetine (CYMBALTA) 60 MG capsule, Take 1 capsule (60 mg total) by mouth daily., Disp: 30 capsule, Rfl: 3 No current facility-administered medications for this visit.  Facility-Administered Medications Ordered in Other Visits:    heparin lock flush 100 UNIT/ML injection, , , ,    heparin lock flush 100 UNIT/ML injection, , , ,    sodium chloride flush (NS) 0.9 % injection 10 mL, 10 mL, Intravenous, PRN, Creig Hines, MD, 10 mL at 04/05/22 1112  Physical exam:  Vitals:   07/16/23 1523  BP: 127/86  Pulse: 81  Resp: 18  Temp: 99.2 F (37.3 C)  TempSrc: Tympanic  SpO2: 100%  Weight: 155 lb 11.2 oz (70.6 kg)  Height: 5\' 9"  (1.753 m)   Physical Exam HENT:     Mouth/Throat:     Comments: Chronic ulceration noted in the left oropharynx. Tracheostomy stoma is closing up Cardiovascular:     Rate and Rhythm: Normal rate and regular rhythm.     Heart sounds: Normal heart sounds.   Pulmonary:     Effort: Pulmonary effort is normal.     Breath sounds: Normal breath sounds.  Abdominal:     General: Bowel sounds are normal. There is no distension.     Palpations: Abdomen is soft.  Tenderness: There is no abdominal tenderness.  Skin:    General: Skin is warm and dry.  Neurological:     Mental Status: He is alert and oriented to person, place, and time.         Latest Ref Rng & Units 05/21/2023   12:13 PM  CMP  Glucose 70 - 99 mg/dL 86   BUN 6 - 20 mg/dL 13   Creatinine 8.41 - 1.24 mg/dL 3.24   Sodium 401 - 027 mmol/L 136   Potassium 3.5 - 5.1 mmol/L 3.3   Chloride 98 - 111 mmol/L 104   CO2 22 - 32 mmol/L 17   Calcium 8.9 - 10.3 mg/dL 9.1   Total Protein 6.5 - 8.1 g/dL 7.2   Total Bilirubin 0.3 - 1.2 mg/dL 1.0   Alkaline Phos 38 - 126 U/L 62   AST 15 - 41 U/L 35   ALT 0 - 44 U/L 21       Latest Ref Rng & Units 05/21/2023   12:13 PM  CBC  WBC 4.0 - 10.5 K/uL 6.0   Hemoglobin 13.0 - 17.0 g/dL 25.3   Hematocrit 66.4 - 52.0 % 40.7   Platelets 150 - 400 K/uL 225      Assessment and plan- Patient is a 52 y.o. 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  He is s/p 5 cycles of weekly cisplatin chemotherapy with concurrent radiation treatment.  Chemo was stopped prematurely due to worsening mucositis and patient completed radiation treatment in April 2022.  He now has noted to have  biopsy-proven locally recurrent disease.  He completed second round of concurrent chemoradiation in June 2023.  He is here for routine f/u  Patient has chronic ulceration after his second round of radiation involving the left oropharynx which has resulted in chronic pain.  Prior to his incarceration he was on as needed oxycodone but subsequently he was on gabapentin and Cymbalta.  He last received the fentanyl patch prescription back in March 2024 and oxycodone back in April 2024.  Presently while he is incarcerated he is  unable to get Magic mouthwash or any form of opioids, ibuprofen or gabapentin.  He can only get duloxetine as per my conversation with him.  It is unclear as to what dose of duloxetine he is getting at the facility.  We will plan to reach the nurse in charge of the facility and see what kind of pain control he can get.  Patient hopes to move to [facility] upon his release from the present and transfer his care there.  Follow-up with me to be decided based on his release from the prison   Visit Diagnosis 1. History of squamous cell carcinoma   2. Chronic pain syndrome      Dr. Owens Shark, MD, MPH [facility] [phone] 07/19/2023 11:54 AM

## 2023-07-23 ENCOUNTER — Telehealth: Payer: Self-pay | Admitting: *Deleted

## 2023-07-23 ENCOUNTER — Other Ambulatory Visit: Payer: Self-pay | Admitting: *Deleted

## 2023-07-23 MED ORDER — PANTOPRAZOLE SODIUM 20 MG PO TBEC: 20.0000 mg | DELAYED_RELEASE_TABLET | Freq: Every day | ORAL | 3 refills | Status: DC

## 2023-07-23 MED ORDER — DULOXETINE HCL 60 MG PO CPEP: 60.0000 mg | ORAL_CAPSULE | Freq: Every day | ORAL | 3 refills | Status: DC

## 2023-07-23 NOTE — Telephone Encounter (Signed)
Josh asked me to send last note from Dr. Smith Robert last visit. She also requested that pt have cymbalta 60 mg and 20 mg Protonix  daily. Hoping that pt can have it while he is in jail. Fax # [phone] and to Darl Pikes at [place]. The transmission went through.

## 2023-12-19 ENCOUNTER — Inpatient Hospital Stay: Payer: MEDICAID

## 2023-12-19 ENCOUNTER — Inpatient Hospital Stay: Payer: MEDICAID | Admitting: Oncology

## 2024-01-17 ENCOUNTER — Telehealth: Payer: Self-pay | Admitting: Oncology

## 2024-01-17 NOTE — Telephone Encounter (Signed)
 Correctional facility called and patient is being released tomorrow. They asked to cancel appointments

## 2024-01-18 ENCOUNTER — Inpatient Hospital Stay: Payer: MEDICAID | Admitting: Oncology

## 2024-01-18 ENCOUNTER — Inpatient Hospital Stay: Payer: MEDICAID

## 2024-01-21 ENCOUNTER — Telehealth: Payer: Self-pay | Admitting: *Deleted

## 2024-01-21 NOTE — Telephone Encounter (Signed)
 Is he still in jail or has he been released? Where is he staying right now?

## 2024-01-21 NOTE — Telephone Encounter (Signed)
 He said that he had an appt  01/18/2024 but he was in jail. He called today to see if Smith Robert can give him another appts or referral in [place].

## 2024-02-14 ENCOUNTER — Telehealth: Payer: Self-pay

## 2024-02-14 NOTE — Telephone Encounter (Signed)
 Patient called to inform us  that he will need a referral to see a new Oncologist in [place] due to the fact he will no longer be able to get rides to the [facility] to continue seeing Dr. Randy Buttery. He also stated that he has an upcoming appointment on 02/28/24 with Dr. Elida Grounds [facility] Oncologist) in [place] regarding the surgery he will be having.   After speaking with Dr. Randy Buttery, I advised patient that she states being the fact that his cancer is in remission, there is no real need to see Dr. Doyne Genin [facility]) and it would be okay for Dr. Elida Grounds alone to continue surveillance.  Patient verbally agreed and stated he will reach back out if need be.

## 2024-02-27 NOTE — Telephone Encounter (Signed)
 Copied from CRM [id number]. Topic: Care Management - Discuss Care Plan >> Feb 27, 2024  1:48 PM Nanetta SAILOR wrote:   Erskin Lame contacted the Communication Center requesting to speak with the care team of Farid Grigorian to discuss:  Shaman is calling stating that he has an appt tomorrow on 5/15 and he received a call stating that he would have to bring 100 and something dollars for his visit because they are saying his medicaid lapsed. He is needing clarity to this because he needs this appt for him to prepare for a scheduled surgery.   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  Thank you,  Nanetta CHRISTELLA [facility]  [phone]

## 2024-02-27 NOTE — Telephone Encounter (Signed)
 Called pt back - gave him number to [employer].  He will follow up with them this afternoon.

## 2024-04-24 NOTE — Telephone Encounter (Signed)
 Spoke with patient to schedule visit for 05/23/24 with Dr. Denys. He verbalized understanding.

## 2024-06-17 ENCOUNTER — Other Ambulatory Visit: Payer: Self-pay

## 2024-06-17 ENCOUNTER — Emergency Department (EMERGENCY_DEPARTMENT_HOSPITAL)
Admission: EM | Admit: 2024-06-17 | Discharge: 2024-06-18 | Disposition: A | Discharge status: Other Acute Inpatient Hospital | Source: Home / Self Care | Attending: Emergency Medicine | Admitting: Emergency Medicine

## 2024-06-17 DIAGNOSIS — F333 Major depressive disorder, recurrent, severe with psychotic symptoms: Secondary | ICD-10-CM | POA: Insufficient documentation

## 2024-06-17 DIAGNOSIS — R45851 Suicidal ideations: Secondary | ICD-10-CM | POA: Insufficient documentation

## 2024-06-17 DIAGNOSIS — F332 Major depressive disorder, recurrent severe without psychotic features: Secondary | ICD-10-CM | POA: Diagnosis not present

## 2024-06-17 DIAGNOSIS — F32A Depression, unspecified: Secondary | ICD-10-CM

## 2024-06-17 LAB — COMPREHENSIVE METABOLIC PANEL WITH GFR
ALT: 15 U/L (ref 0–44)
AST: 20 U/L (ref 15–41)
Albumin: 4.7 g/dL (ref 3.5–5.0)
Alkaline Phosphatase: 78 U/L (ref 38–126)
Anion gap: 12 (ref 5–15)
BUN: 16 mg/dL (ref 6–20)
CO2: 25 mmol/L (ref 22–32)
Calcium: 9.4 mg/dL (ref 8.9–10.3)
Chloride: 100 mmol/L (ref 98–111)
Creatinine, Ser: 0.65 mg/dL (ref 0.61–1.24)
GFR, Estimated: >60 mL/min (ref >60)
Glucose, Bld: 91 mg/dL (ref 70–99)
Potassium: 4 mmol/L (ref 3.5–5.1)
Sodium: 137 mmol/L (ref 135–145)
Total Bilirubin: 1.2 mg/dL (ref 0.0–1.2)
Total Protein: 8 g/dL (ref 6.5–8.1)

## 2024-06-17 LAB — URINE DRUG SCREEN, QUALITATIVE (ARMC ONLY)
Amphetamines, Ur Screen: NOT DETECTED
Barbiturates, Ur Screen: NOT DETECTED
Benzodiazepine, Ur Scrn: NOT DETECTED
Cannabinoid 50 Ng, Ur ~~LOC~~: NOT DETECTED
Cocaine Metabolite,Ur ~~LOC~~: POSITIVE — AB
MDMA (Ecstasy)Ur Screen: NOT DETECTED
Methadone Scn, Ur: NOT DETECTED
Opiate, Ur Screen: NOT DETECTED
Phencyclidine (PCP) Ur S: NOT DETECTED
Tricyclic, Ur Screen: NOT DETECTED

## 2024-06-17 LAB — CBC
HCT: 42 % (ref 39.0–52.0)
Hemoglobin: 14.9 g/dL (ref 13.0–17.0)
MCH: 33.3 pg (ref 26.0–34.0)
MCHC: 35.5 g/dL (ref 30.0–36.0)
MCV: 94 fL (ref 80.0–100.0)
Platelets: 265 K/uL (ref 150–400)
RBC: 4.47 MIL/uL (ref 4.22–5.81)
RDW: 12.6 % (ref 11.5–15.5)
WBC: 7.8 K/uL (ref 4.0–10.5)
nRBC: 0 % (ref 0.0–0.2)

## 2024-06-17 LAB — SALICYLATE LEVEL: Salicylate Lvl: <7 mg/dL — ABNORMAL LOW (ref 7.0–30.0)

## 2024-06-17 LAB — ACETAMINOPHEN LEVEL: Acetaminophen (Tylenol), Serum: <10 ug/mL — ABNORMAL LOW (ref 10–30)

## 2024-06-17 LAB — ETHANOL: Alcohol, Ethyl (B): <15 mg/dL (ref <15)

## 2024-06-17 NOTE — ED Notes (Signed)
 Shift report received, patient escorted to [facility] by security, patient re wanded in intake room by [employer]. Patient placed in BHU Room 3. Blanket provided to patient. Assumed care of patient.

## 2024-06-17 NOTE — BH Assessment (Signed)
 Destination  Service Provider Request Status Services Address Phone Fax Patient Preferred  [facility]  Pending - Request Sent -- [address] [place] [address] [phone] --  [facility] Patient Placement  Pending - Request Sent -- [facility] [place] [phone] --  [facility]  Pending - Request Sent -- [address] [address] [phone] --  [facility]  Pending - Request Sent -- [address] [place] [address] [phone] --  [facility]  Pending - Request Sent -- [address], [place] [address] [phone] --  [facility]  Pending - Request Sent -- [address] [place] [address] [phone] --  [facility]  Pending - Request Sent -- [address] [place] [address] [phone] --  [facility]  Pending - Request Sent -- [place] [phone] --  [facility]  Pending - Request Sent -- [address] [place] [address] [phone] --  [facility]  Pending - Request Sent -- [address] [place] [address] [phone] --  [facility]  Pending - Request Sent -- [address] [place] [address] [phone] --

## 2024-06-17 NOTE — ED Notes (Signed)
 Dressing on pts trach stoma changed.

## 2024-06-17 NOTE — ED Notes (Signed)
Pt given PM snack at this time.  

## 2024-06-17 NOTE — ED Notes (Signed)
 Pt ambulatory without difficulty or distress to ED 21. RN in room and introduced self to pt. Pt states that he has been having a difficult time lately. He is currently waiting to have his trach stoma cover and to have something done about the burns in his mouth from his radiation. Pt states that his medicaid just recently got reinstated so he is hoping he can do this soon. Pt states that he recently broke up with his significant other after 4 years. Pt is tearful on assessment.  Pt states that he has had thoughts of harming himself but that he does not have a plan. Pt states that sometimes he prays to God that he will not wake up. Pt denies thoughts of harming other. Pt denies hallucinations.  Pt states that he does drink alcohol, usually 2 beers per day. Pt states that he has never been in withdrawal from alcohol. Pt also states that he occasionally uses cocaine  and he last used this approximately 36 hours ago.   Pt provided with a pillow and something to drink. Pt states that he will need to have his trach stoma covered because when he eat or drinks it comes out of it.

## 2024-06-17 NOTE — ED Notes (Addendum)
 Pt dressed out by this RN and and EDT Jacquline  Pt: belongings: Lighter wallet [employer] Pair of shoes Pair of socks Handkerchief  Belt Shirt Pants Underwear 2 bracelets  **Pocket knife locked up by security prior to triage.**  Pt with an eye glass sleeve, reading glasses, and a piece of paper with phone numbers on it. All of those things kept with him. Other listed items above, locked up in pt's belongings.

## 2024-06-17 NOTE — ED Notes (Signed)
VOL  pending  consult 

## 2024-06-17 NOTE — ED Notes (Signed)
 Pt given warm blanket.

## 2024-06-17 NOTE — Progress Notes (Signed)
 Patient is seen by psychiatry and recommend inpatient admission.Patient consented to voluntary admission.  Full consult note to be followed

## 2024-06-17 NOTE — ED Triage Notes (Signed)
 Pt comes in via pov stating that he is not in a good place mentally and what's to evaluated. Pt is feeling overwhelmed with his health problems and personal life. Pt cooperative in triage, and complains of mouth pain from open radiation burns in his mouth. Pt has a history of tongue and lymph node cancer. Pt's last radiation treatment was 22 months ago.  Pt complains of pain 6/10.

## 2024-06-17 NOTE — BH Assessment (Signed)
 Comprehensive Clinical Assessment (CCA) Screening, Triage and Referral Note  06/17/2024 Paul Hebert [id number]  Paul Hebert, 53 year old male who presents to [facility] ED voluntarily for treatment. Per triage note, Pt comes in via pov stating that he is not in a good place mentally and what's to evaluated. Pt is feeling overwhelmed with his health problems and personal life. Pt cooperative in triage and complains of mouth pain from open radiation burns in his mouth. Pt has a history of tongue and lymph node cancer. Pt's last radiation treatment was 22 months ago.  Pt complains of pain 6/10.   During TTS assessment pt presents alert and oriented x 4, restless but cooperative, and mood-congruent with affect. The pt does not appear to be responding to internal or external stimuli. Neither is the pt presenting with any delusional thinking. Pt verified the information provided to triage RN.   Pt identifies his main complaint to be that he's "been in a bad place." Patient reports he has been having health and relationship problems. Patient states he recently ended a 4-year relationship with his significant other, he's extremely anxious, not currently working, living at a [employer], his depressive symptoms have worsened over the past couple of days, having thoughts of suicide, with poor sleep and eating habits. "It just hit me like a ton of bricks." Patient admits to drinking 2- 24 oz beers daily since April and recent use of cocaine  about 36hrs ago. Pt reports INPT hx at [facility] a few years ago for mental stress. Patient states he is not being followed by an outpatient provider nor is he taking any psych medications at this time. "I stopped taking them because they made me feel loopy." Patient reports a prior suicide attempt where he tried to overdose. Patient denies any self-injurious behaviors. Pt denies current HI/AH/VH. Pt is unable to contract for safety and believes inpatient admission would be  beneficial.    Per Dr. Jadapalle, pt is recommended for inpatient psychiatric admission.  Chief Complaint:  Chief Complaint  Patient presents with   Suicidal   Visit Diagnosis: Suicidal  Patient Reported Information How did you hear about us ? Self  What Is the Reason for Your Visit/Call Today? Pt is feeling overwhelmed with his health problems and personal life. Worsening SI for the past couple of days.  How Long Has This Been Causing You Problems? 1 wk - 1 month  What Do You Feel Would Help You the Most Today? Treatment for Depression or other mood problem; Social Support; Stress Management; Medication(s)   Have You Recently Had Any Thoughts About Hurting Yourself? Yes  Are You Planning to Commit Suicide/Harm Yourself At This time? No   Have you Recently Had Thoughts About Hurting Someone Sherral? No  Are You Planning to Harm Someone at This Time? No  Explanation: No data recorded  Have You Used Any Alcohol or Drugs in the Past 24 Hours? Yes  How Long Ago Did You Use Drugs or Alcohol? 36 hrs ago  What Did You Use and How Much? Patient reports cocaine  use and drinks 2-24oz beers daily.   Do You Currently Have a Therapist/Psychiatrist? No  Name of Therapist/Psychiatrist: No data recorded  Have You Been Recently Discharged From Any Office Practice or Programs? No  Explanation of Discharge From Practice/Program: No data recorded   CCA Screening Triage Referral Assessment Type of Contact: Face-to-Face  Telemedicine Service Delivery:   Is this Initial or Reassessment?   Date Telepsych consult ordered in  CH[facility]:    Time Telepsych consult ordered in [facility]:    Location of Assessment: [facility] ED  Provider Location: [facility] ED    Collateral Involvement: None provided   Does Patient Have a Court Appointed Legal Guardian? No data recorded Name and Contact of Legal Guardian: No data recorded If Minor and Not Living with Parent(s), Who has Custody? No data recorded Is CPS  involved or ever been involved? No data recorded Is APS involved or ever been involved? No data recorded  Patient Determined To Be At Risk for Harm To Self or Others Based on Review of Patient Reported Information or Presenting Complaint? Yes, for Self-Harm  Method: No Plan  Availability of Means: No access or NA  Intent: Vague intent or NA  Notification Required: No need or identified person  Additional Information for Danger to Others Potential: No data recorded Additional Comments for Danger to Others Potential: No data recorded Are There Guns or Other Weapons in Your Home? No  Types of Guns/Weapons: No data recorded Are These Weapons Safely Secured?                            No data recorded Who Could Verify You Are Able To Have These Secured: No data recorded Do You Have any Outstanding Charges, Pending Court Dates, Parole/Probation? No data recorded Contacted To Inform of Risk of Harm To Self or Others: No data recorded  Does Patient Present under Involuntary Commitment? No    [place] of Residence: Paul Hebert   Patient Currently Receiving the Following Services: Not Receiving Services   Determination of Need: Emergent (2 hours)   Options For Referral: ED Visit; Medication Management; Inpatient Hospitalization; Intensive Outpatient Therapy   Disposition Recommendation per psychiatric provider: Per Dr. Jadapalle, pt is recommended for inpatient psychiatric admission.  Paul Hebert Dolly, Counselor, LCAS-A
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
## 2024-06-17 NOTE — ED Notes (Signed)
 Hospital meal provided, pt tolerated w/o complaints.  Waste discarded appropriately.

## 2024-06-17 NOTE — ED Notes (Signed)
 Vol /psych consult complete /recommend inpatient admission /moved to [facility]
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
## 2024-06-18 ENCOUNTER — Encounter: Payer: Self-pay | Admitting: Psychiatry

## 2024-06-18 ENCOUNTER — Other Ambulatory Visit: Payer: Self-pay

## 2024-06-18 ENCOUNTER — Inpatient Hospital Stay
Admission: AD | Admit: 2024-06-18 | Discharge: 2024-06-25 | DRG: 885 | Disposition: A | Discharge status: Home / Self Care | Source: Intra-hospital | Attending: Psychiatry | Admitting: Psychiatry

## 2024-06-18 DIAGNOSIS — Z9151 Personal history of suicidal behavior: Secondary | ICD-10-CM

## 2024-06-18 DIAGNOSIS — K219 Gastro-esophageal reflux disease without esophagitis: Secondary | ICD-10-CM | POA: Diagnosis present

## 2024-06-18 DIAGNOSIS — F332 Major depressive disorder, recurrent severe without psychotic features: Secondary | ICD-10-CM | POA: Diagnosis present

## 2024-06-18 DIAGNOSIS — Z604 Social exclusion and rejection: Secondary | ICD-10-CM | POA: Diagnosis present

## 2024-06-18 DIAGNOSIS — Z85818 Personal history of malignant neoplasm of other sites of lip, oral cavity, and pharynx: Secondary | ICD-10-CM

## 2024-06-18 DIAGNOSIS — Z91148 Patient's other noncompliance with medication regimen for other reason: Secondary | ICD-10-CM | POA: Diagnosis not present

## 2024-06-18 DIAGNOSIS — Z560 Unemployment, unspecified: Secondary | ICD-10-CM | POA: Diagnosis not present

## 2024-06-18 DIAGNOSIS — Z79891 Long term (current) use of opiate analgesic: Secondary | ICD-10-CM

## 2024-06-18 DIAGNOSIS — G8929 Other chronic pain: Secondary | ICD-10-CM | POA: Diagnosis present

## 2024-06-18 DIAGNOSIS — F419 Anxiety disorder, unspecified: Secondary | ICD-10-CM | POA: Diagnosis present

## 2024-06-18 DIAGNOSIS — Z653 Problems related to other legal circumstances: Secondary | ICD-10-CM

## 2024-06-18 DIAGNOSIS — R45851 Suicidal ideations: Secondary | ICD-10-CM | POA: Diagnosis present

## 2024-06-18 DIAGNOSIS — Z79899 Other long term (current) drug therapy: Secondary | ICD-10-CM | POA: Diagnosis not present

## 2024-06-18 DIAGNOSIS — Z923 Personal history of irradiation: Secondary | ICD-10-CM | POA: Diagnosis not present

## 2024-06-18 DIAGNOSIS — F339 Major depressive disorder, recurrent, unspecified: Secondary | ICD-10-CM | POA: Diagnosis not present

## 2024-06-18 DIAGNOSIS — F17210 Nicotine dependence, cigarettes, uncomplicated: Secondary | ICD-10-CM | POA: Diagnosis present

## 2024-06-18 DIAGNOSIS — Z930 Tracheostomy status: Secondary | ICD-10-CM

## 2024-06-18 DIAGNOSIS — Z5986 Financial insecurity: Secondary | ICD-10-CM

## 2024-06-18 DIAGNOSIS — Z5982 Transportation insecurity: Secondary | ICD-10-CM

## 2024-06-18 DIAGNOSIS — Z5900 Homelessness unspecified: Secondary | ICD-10-CM | POA: Diagnosis not present

## 2024-06-18 DIAGNOSIS — Z5941 Food insecurity: Secondary | ICD-10-CM

## 2024-06-18 MED ORDER — HALOPERIDOL 5 MG PO TABS: 5.0000 mg | ORAL_TABLET | Freq: Three times a day (TID) | ORAL | Status: DC | PRN

## 2024-06-18 MED ORDER — OLANZAPINE 5 MG PO TBDP: 5.0000 mg | ORAL_TABLET | Freq: Three times a day (TID) | ORAL | Status: DC | PRN

## 2024-06-18 MED ORDER — TRAZODONE HCL 50 MG PO TABS
50.0000 mg | ORAL_TABLET | Freq: Every day | ORAL | Status: DC
  Administered 2024-06-18 – 2024-06-24 (×5): 50 mg via ORAL
  Filled 2024-06-18 (×5): qty 1

## 2024-06-18 MED ORDER — DIPHENHYDRAMINE HCL 25 MG PO CAPS: 50.0000 mg | ORAL_CAPSULE | Freq: Three times a day (TID) | ORAL | Status: DC | PRN

## 2024-06-18 MED ORDER — NICOTINE 14 MG/24HR TD PT24
14.0000 mg | MEDICATED_PATCH | Freq: Every day | TRANSDERMAL | Status: DC
  Administered 2024-06-18 – 2024-06-25 (×8): 14 mg via TRANSDERMAL
  Filled 2024-06-18 (×8): qty 1

## 2024-06-18 MED ORDER — MAGNESIUM HYDROXIDE 400 MG/5ML PO SUSP: 30.0000 mL | Freq: Every day | ORAL | Status: DC | PRN

## 2024-06-18 MED ORDER — LORAZEPAM 2 MG/ML IJ SOLN: 2.0000 mg | Freq: Three times a day (TID) | INTRAMUSCULAR | Status: DC | PRN

## 2024-06-18 MED ORDER — ACETAMINOPHEN 325 MG PO TABS
650.0000 mg | ORAL_TABLET | Freq: Four times a day (QID) | ORAL | Status: DC | PRN
  Administered 2024-06-18 – 2024-06-21 (×5): 650 mg via ORAL
  Filled 2024-06-18 (×6): qty 2

## 2024-06-18 MED ORDER — HALOPERIDOL LACTATE 5 MG/ML IJ SOLN: 5.0000 mg | Freq: Three times a day (TID) | INTRAMUSCULAR | Status: DC | PRN

## 2024-06-18 MED ORDER — DIPHENHYDRAMINE HCL 50 MG/ML IJ SOLN: 50.0000 mg | Freq: Three times a day (TID) | INTRAMUSCULAR | Status: DC | PRN

## 2024-06-18 MED ORDER — ALUM & MAG HYDROXIDE-SIMETH 200-200-20 MG/5ML PO SUSP: 30.0000 mL | ORAL | Status: DC | PRN

## 2024-06-18 MED ORDER — OLANZAPINE 10 MG IM SOLR: 10.0000 mg | Freq: Three times a day (TID) | INTRAMUSCULAR | Status: DC | PRN

## 2024-06-18 MED ORDER — ACETAMINOPHEN 325 MG PO TABS: 650.0000 mg | ORAL_TABLET | Freq: Four times a day (QID) | ORAL | Status: DC | PRN

## 2024-06-18 NOTE — Progress Notes (Signed)
 Pt c/o mouth pain 6/10. Provider notified and awaiting orders. Pt reports mild anxiety 4/10 and denies depression at the moment. Pt denies SIHI, AVH.  06/18/24 1957  Psych Admission Type (Psych Patients Only)  Admission Status Voluntary  Psychosocial Assessment  Patient Complaints Anxiety;Sadness  Eye Contact Fair  Facial Expression Animated  Affect Appropriate to circumstance  Speech Logical/coherent;Other (Comment) (trach stoma)  Interaction Assertive  Motor Activity Other (Comment) (WNL)  Appearance/Hygiene Unremarkable  Behavior Characteristics Cooperative;Appropriate to situation  Mood Sad  Aggressive Behavior  Effect No apparent injury  Thought Process  Coherency WDL  Content WDL  Delusions None reported or observed  Perception WDL  Hallucination None reported or observed  Judgment Limited  Confusion WDL  Danger to Self  Current suicidal ideation? Denies  Danger to Others  Danger to Others None reported or observed

## 2024-06-18 NOTE — BH Assessment (Signed)
 Patient is to be admitted to [facility] on today 06/18/24 by Dr. Jadapalle.  Attending Physician will be Dr. Jadapalle.   Patient has been assigned to room 309, by [facility] Charge Nurse Gigi.    ER staff is aware of the admission: Olam, ER Secretary   Dr. Claudene, ER MD  Camelia, Patient's Nurse  Curtistine, Patient Access.

## 2024-06-18 NOTE — ED Notes (Signed)
 Vol/psych consult complete/recomm inpt admi/moved to [facility]

## 2024-06-18 NOTE — Plan of Care (Signed)
  Problem: Education: Goal: Emotional status will improve Outcome: Progressing   Problem: Activity: Goal: Interest or engagement in activities will improve Outcome: Progressing   Problem: Safety: Goal: Periods of time without injury will increase Outcome: Progressing

## 2024-06-18 NOTE — Tx Team (Signed)
 Initial Treatment Plan 06/18/2024 6:09 PM Paul Hebert FMW:[id number]    PATIENT STRESSORS: Financial difficulties   Health problems   Marital or family conflict   Substance abuse     PATIENT STRENGTHS: Average or above average intelligence  Communication skills  Motivation for treatment/growth  Religious Affiliation    PATIENT IDENTIFIED PROBLEMS: Depression  Relationship issues.  Financial stress                 DISCHARGE CRITERIA:  Ability to meet basic life and health needs Adequate post-discharge living arrangements Verbal commitment to aftercare and medication compliance  PRELIMINARY DISCHARGE PLAN: Attend aftercare/continuing care group Return to previous living arrangement  PATIENT/FAMILY INVOLVEMENT: This treatment plan has been presented to and reviewed with the patient, Paul Hebert, and/or family member.  The patient and family have been given the opportunity to ask questions and make suggestions.  Dollie Zachary Sandifer, RN 06/18/2024, 6:09 PM

## 2024-06-18 NOTE — ED Notes (Signed)
 Pt showered and disposed of materials appropriately. Now changing trach stoma dressing with RN Crystal

## 2024-06-18 NOTE — Progress Notes (Signed)
 53 year old male patient admitted for depression with self harm thoughts. Patient appears sad and tearful but cooperative with admission assessment. Patient uses alcohol and cocaine  occasionally. Patient have H/O cancer. He got trach stoma covered with gauze. Patient denies SI,HI and AVH at this time.Skin assessment and body search done.No contraband found.Oriented to unit and made comfortable in room. Support and encouragement given.

## 2024-06-18 NOTE — ED Notes (Signed)
Pt provided with lunch tray at bedside

## 2024-06-18 NOTE — ED Provider Notes (Signed)
 Patient going downstairs to the BMU.  Discharged to facilitate this.   Claudene Rover, MD 06/18/24 [phone]

## 2024-06-18 NOTE — Group Note (Signed)
 Date:  06/18/2024 Time:  5:42 PM  Group Topic/Focus:  Overcoming Stress:   The focus of this group is to define stress and help patients assess their triggers.    Participation Level:  Did Not Attend   Paul Hebert 06/18/2024, 5:42 PM

## 2024-06-18 NOTE — ED Notes (Signed)
 Pt provided breakfast tray.

## 2024-06-18 NOTE — Group Note (Signed)
 Date:  06/18/2024 Time:  9:25 PM  Group Topic/Focus:  Wrap-Up Group:   The focus of this group is to help patients review their daily goal of treatment and discuss progress on daily workbooks.    Participation Level:  Minimal  Participation Quality:  Appropriate and Attentive  Affect:  Appropriate  Cognitive:  Appropriate  Insight: Appropriate  Engagement in Group:  Engaged  Modes of Intervention:  Activity and Discussion  Additional Comments:     Arlester CHRISTELLA Servant 06/18/2024, 9:25 PM

## 2024-06-18 NOTE — ED Notes (Signed)
 Pt request to shower. Supplies provided. Shower light not working. Secretary filed maintenance request.

## 2024-06-19 LAB — LIPID PANEL
Cholesterol: 168 mg/dL (ref 0–200)
HDL: 58 mg/dL (ref >40)
LDL Cholesterol: 98 mg/dL (ref 0–99)
Total CHOL/HDL Ratio: 2.9 ratio
Triglycerides: 62 mg/dL (ref <150)
VLDL: 12 mg/dL (ref 0–40)

## 2024-06-19 LAB — HEMOGLOBIN A1C
Hgb A1c MFr Bld: 5.6 % (ref 4.8–5.6)
Mean Plasma Glucose: 114 mg/dL

## 2024-06-19 MED ORDER — CHLORDIAZEPOXIDE HCL 25 MG PO CAPS
25.0000 mg | ORAL_CAPSULE | Freq: Every day | ORAL | Status: AC
  Administered 2024-06-23: 25 mg via ORAL
  Filled 2024-06-19: qty 1

## 2024-06-19 MED ORDER — ONDANSETRON 4 MG PO TBDP: 4.0000 mg | ORAL_TABLET | Freq: Four times a day (QID) | ORAL | Status: AC | PRN

## 2024-06-19 MED ORDER — CHLORDIAZEPOXIDE HCL 25 MG PO CAPS
25.0000 mg | ORAL_CAPSULE | Freq: Three times a day (TID) | ORAL | Status: AC
  Administered 2024-06-20 – 2024-06-21 (×3): 25 mg via ORAL
  Filled 2024-06-19 (×3): qty 1

## 2024-06-19 MED ORDER — THIAMINE HCL 100 MG/ML IJ SOLN
100.0000 mg | Freq: Once | INTRAMUSCULAR | Status: AC
  Administered 2024-06-19: 100 mg via INTRAMUSCULAR
  Filled 2024-06-19: qty 2

## 2024-06-19 MED ORDER — LOPERAMIDE HCL 2 MG PO CAPS: 2.0000 mg | ORAL_CAPSULE | ORAL | Status: AC | PRN

## 2024-06-19 MED ORDER — ADULT MULTIVITAMIN W/MINERALS CH
1.0000 | ORAL_TABLET | Freq: Every day | ORAL | Status: DC
  Administered 2024-06-19 – 2024-06-25 (×7): 1 via ORAL
  Filled 2024-06-19 (×7): qty 1

## 2024-06-19 MED ORDER — CHLORDIAZEPOXIDE HCL 25 MG PO CAPS
25.0000 mg | ORAL_CAPSULE | Freq: Four times a day (QID) | ORAL | Status: AC | PRN
Start: 2024-06-19 — End: 2024-06-22

## 2024-06-19 MED ORDER — HYDROXYZINE HCL 25 MG PO TABS: 25.0000 mg | ORAL_TABLET | ORAL | Status: DC | PRN

## 2024-06-19 MED ORDER — CHLORDIAZEPOXIDE HCL 25 MG PO CAPS
25.0000 mg | ORAL_CAPSULE | ORAL | Status: AC
  Administered 2024-06-21 – 2024-06-22 (×2): 25 mg via ORAL
  Filled 2024-06-19 (×2): qty 1

## 2024-06-19 MED ORDER — DULOXETINE HCL 30 MG PO CPEP
30.0000 mg | ORAL_CAPSULE | Freq: Every day | ORAL | Status: DC
  Administered 2024-06-19 – 2024-06-22 (×4): 30 mg via ORAL
  Filled 2024-06-19 (×5): qty 1

## 2024-06-19 MED ORDER — GABAPENTIN 300 MG PO CAPS
300.0000 mg | ORAL_CAPSULE | Freq: Three times a day (TID) | ORAL | Status: DC
  Administered 2024-06-19 – 2024-06-25 (×17): 300 mg via ORAL
  Filled 2024-06-19 (×17): qty 1

## 2024-06-19 MED ORDER — HYDROXYZINE HCL 25 MG PO TABS
25.0000 mg | ORAL_TABLET | Freq: Four times a day (QID) | ORAL | Status: AC | PRN
  Administered 2024-06-20 – 2024-06-22 (×5): 25 mg via ORAL
  Filled 2024-06-19 (×5): qty 1

## 2024-06-19 MED ORDER — CHLORDIAZEPOXIDE HCL 25 MG PO CAPS
25.0000 mg | ORAL_CAPSULE | Freq: Four times a day (QID) | ORAL | Status: AC
  Administered 2024-06-19 – 2024-06-20 (×4): 25 mg via ORAL
  Filled 2024-06-19 (×4): qty 1

## 2024-06-19 NOTE — Plan of Care (Signed)
  Problem: Education: Goal: Emotional status will improve Outcome: Progressing   Problem: Education: Goal: Knowledge of disease or condition will improve Outcome: Progressing   Problem: Health Behavior/Discharge Planning: Goal: Ability to identify changes in lifestyle to reduce recurrence of condition will improve Outcome: Progressing

## 2024-06-19 NOTE — Progress Notes (Signed)
 Patient calm and pleasant during assessment denying SI/HI/AVH. Pt observed by this Clinical research associate interacting appropriately with staff and peers on the unit. Pt compliant with Librium  medication tonight, refused his trazodone  stating he didn't like the way it made him feel when he woke up. Pt given education, support, and encouragement to be active in his treatment plan. Pt being monitored Q 15 minutes for safety per unit protocol, remains safe on the unit
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
## 2024-06-19 NOTE — Progress Notes (Signed)
   06/19/24 1550  Spiritual Encounters  Type of Visit Follow up  Care provided to: Patient  Referral source Chaplain assessment  Reason for visit Routine spiritual support   While outside with peers, Paul Hebert engaged with me for further processing of experiences that he touched upon during group today.  Paul Hebert around his experience with cancer. One hospitalization was quiet intense and led to a six week induced coma. He finds meaning in his Paul Hebert faith and practicing gratitude. He continues coping with grief and loss due to lost relationship, financial loss, career setback. He finds support in relationship with brother who is out of state.  This chaplain provided compassionate presence and both active and reflective listening. Facilitated naming elements of grief. Utilized Worden's tasks to help identify aspects of healing grief, particularly adapting; invited reflection of ways to adapt career and name gratitude, sense of purpose, locating resources. Provided relational support and encouragement.  Paul Hebert L. [name].Div

## 2024-06-19 NOTE — Group Note (Signed)
 Recreation Therapy Group Note   Group Topic:Goal Setting  Group Date: 06/19/2024 Start Time: 1000 End Time: 1100 Facilitators: Celestia Jeoffrey BRAVO, LRT, CTRS Location: Craft Room  Group Description: Product/process development scientist. Patients were given many different magazines, a glue stick, markers, and a piece of cardstock paper. LRT and pts discussed the importance of having goals in life. LRT and pts discussed the difference between short-term and long-term goals, as well as what a SMART goal is. LRT encouraged pts to create a vision board, with images they picked and then cut out with safety scissors from the magazine, for themselves, that capture their short and long-term goals. LRT encouraged pts to show and explain their vision board to the group.   Goal Area(s) Addressed:  Patient will gain knowledge of short vs. long term goals.  Patient will identify goals for themselves. Patient will practice setting SMART goals. Patient will verbalize their goals to LRT and peers.   Affect/Mood: Appropriate   Participation Level: Active and Engaged   Participation Quality: Independent   Behavior: Appropriate, Calm, and Cooperative   Speech/Thought Process: Coherent   Insight: Good   Judgement: Good   Modes of Intervention: Art and Education   Patient Response to Interventions:  Attentive, Engaged, Interested , and Receptive   Education Outcome:  Acknowledges education   Clinical Observations/Individualized Feedback: Mackenzy was active in their participation of session activities and group discussion. Pt identified I am fighting to go back home and be with my family. Pt appropriately identified images to reflect this. Pt interacted well with LRT and peers duration of session.    Plan: Continue to engage patient in RT group sessions 2-3x/week.   Jeoffrey BRAVO Celestia, LRT, CTRS 06/19/2024 1:34 PM

## 2024-06-19 NOTE — Group Note (Signed)
 Date:  06/19/2024 Time:  10:15 AM  Group Topic/Focus:  Recovery Goals:   The focus of this group is to identify appropriate goals for recovery and establish a plan to achieve them.    Participation Level:  Did Not Attend   Paul Hebert Paul Hebert 06/19/2024, 10:15 AM

## 2024-06-19 NOTE — Plan of Care (Signed)
  Problem: Education: Goal: Knowledge of disease or condition will improve Outcome: Progressing   Problem: Health Behavior/Discharge Planning: Goal: Ability to identify changes in lifestyle to reduce recurrence of condition will improve Outcome: Progressing

## 2024-06-19 NOTE — BHH Group Notes (Signed)
 Spirituality Group   Description: Participant directed exploration of values, beliefs and meaning  **Focus on "Community & Connections": How do we know we are connecting? What does that mean? What are barriers to connecting with others? What do we gain/risk?  Reflect and locate spiritual aspects (eg, purpose, joy, meaning, sense of self) of connection.   Following a brief framework of chaplain's role and ground rules of group behavior, participants are invited to share concerns or questions that engage spiritual life. Emphasis placed on common themes and shared experiences and ways to make meaning and clarify living into one's values.   Theory/Process/Goal: Utilize the theoretical framework of group therapy established by Celena Kite, Relational Cultural Theory and Rogerian approaches to facilitate relational empathy and use of the "here and now" to foster reflection, self-awareness, and sharing.   Observations: Paul Hebert was actively engaged in the group discussion. He contributed with openness and vulnerability in ways that fostered group trust and empathy.  Paul Ballin L. Paul Hebert.Div

## 2024-06-19 NOTE — Group Note (Signed)
 Date:  06/19/2024 Time:  8:35 PM  Group Topic/Focus:  Wrap-Up Group:   The focus of this group is to help patients review how their day was and discussing what their goal for tomorrow is.    Participation Level:  Active  Participation Quality:  Appropriate  Affect:  Appropriate  Cognitive:  Appropriate  Insight: Appropriate  Engagement in Group:  Engaged  Modes of Intervention:  Activity and Discussion  Additional Comments:    Paul Hebert 06/19/2024, 8:35 PM

## 2024-06-19 NOTE — Progress Notes (Signed)
   06/19/24 0900  Psych Admission Type (Psych Patients Only)  Admission Status Voluntary  Psychosocial Assessment  Patient Complaints Anxiety;Depression (depression 9/10 anxiety 8/10/ pt states 'I do not like the way trazodone  makes me feel, I feel shitty this morning)  Eye Contact Intense  Facial Expression Sad  Affect Appropriate to circumstance  Speech Logical/coherent  Interaction Assertive  Motor Activity Other (Comment) (WNL)  Appearance/Hygiene Unremarkable  Behavior Characteristics Cooperative;Appropriate to situation  Mood Depressed;Sad;Preoccupied  Thought Process  Coherency WDL  Content WDL  Delusions None reported or observed  Perception WDL  Hallucination None reported or observed  Judgment Impaired  Confusion None  Danger to Self  Current suicidal ideation? Denies  Danger to Others  Danger to Others None reported or observed

## 2024-06-19 NOTE — Group Note (Signed)
 [facility] LCSW Group Therapy Note   Group Date: 06/19/2024 Start Time: 1300 End Time: 1400  Type of Therapy/Topic:  Group Therapy:  Feelings about Diagnosis  Participation Level:  Minimal   Description of Group:    This group will allow patients to explore their thoughts and feelings about diagnoses they have received. Patients will be guided to explore their level of understanding and acceptance of these diagnoses. Facilitator will encourage patients to process their thoughts and feelings about the reactions of others to their diagnosis, and will guide patients in identifying ways to discuss their diagnosis with significant others in their lives. This group will be process-oriented, with patients participating in exploration of their own experiences as well as giving and receiving support and challenge from other group members.   Therapeutic Goals: 1. Patient will demonstrate understanding of diagnosis as evidence by identifying two or more symptoms of the disorder:  2. Patient will be able to express two feelings regarding the diagnosis 3. Patient will demonstrate ability to communicate their needs through discussion and/or role plays  Summary of Patient Progress: Patient was present in group.  Patient discussed how he feels shame about his diagnosis.  CSW expressed that this is common.  CSW provided patient with support.    Therapeutic Modalities:   Cognitive Behavioral Therapy Brief Therapy Feelings Identification    Sherryle JINNY Margo, LCSW
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
## 2024-06-20 NOTE — Progress Notes (Signed)
 Patient calm and pleasant during assessment denying SI/HI/AVH. Pt observed by this Clinical research associate interacting appropriately with staff and peers on the unit. Pt refused his trazodone  stating he didn't like the way it made him feel when he woke up. Pt given education, support, and encouragement to be active in his treatment plan. Pt being monitored Q 15 minutes for safety per unit protocol, remains safe on the unit

## 2024-06-20 NOTE — Progress Notes (Signed)
 [facility] MD Progress Note  06/20/2024 4:07 PM Paul Hebert  MRN:  [id number]  Patient presented voluntarily for psychiatric evaluation due to severe depressive symptoms with passive suicidal ideation. He describes feeling overwhelmed and hopeless, stating, "Recently I've given up, like what's the point -- I'm 53 years old and my life is in shambles." Admission is indicated to stabilize mood and address risk in the setting of psychosocial stressors, chronic depression, and limited supports.   Subjective:  Chart reviewed, case discussed in multidisciplinary meeting, patient seen during rounds.   Patient reports he was able to sleep some overnight. He notes that gabapentin  has been helpful for both mood and anxiety symptoms. He continues to describe a depressed mood, though appears to be beginning to engage more with the unit environment. He currently denies active plan or intent, clarifying his thoughts are more passive in nature. He denies mania, hallucinations, paranoia, or delusions.   Overall assessment remains consistent with prior evaluation, with no acute changes in presentation.    Sleep: Fair  Appetite:  Fair  Past Psychiatric History: see h&P Family History:  Family History  Problem Relation Age of Onset   Cancer Father    Social History:  Social History   Substance and Sexual Activity  Alcohol Use Not Currently     Social History   Substance and Sexual Activity  Drug Use Yes   Types: Marijuana, Crack cocaine , Methamphetamines   Comment: marijuana every day except the joint    Social History   Socioeconomic History   Marital status: Single    Spouse name: Not on file   Number of children: Not on file   Years of education: Not on file   Highest education level: Not on file  Occupational History   Not on file  Tobacco Use   Smoking status: Every Day    Current packs/day: 0.50    Average packs/day: 0.5 packs/day for 35.0 years (17.5 ttl pk-yrs)    Types:  Cigarettes   Smokeless tobacco: Former   Tobacco comments:    Patches very rarely  Vaping Use   Vaping status: Former  Substance and Sexual Activity   Alcohol use: Not Currently   Drug use: Yes    Types: Marijuana, Crack cocaine , Methamphetamines    Comment: marijuana every day except the joint   Sexual activity: Not Currently  Other Topics Concern   Not on file  Social History Narrative   Not on file   Social Drivers of Health   Financial Resource Strain: Medium Risk (06/13/2022)   Received from [facility]   Overall Financial Resource Strain (CARDIA)    Difficulty of Paying Living Expenses: Somewhat hard  Food Insecurity: Food Insecurity Present (06/18/2024)   Hunger Vital Sign    Worried About Running Out of Food in the Last Year: Sometimes true    Ran Out of Food in the Last Year: Never true  Transportation Needs: Unmet Transportation Needs (06/18/2024)   PRAPARE - Administrator, Civil Service (Medical): Yes    Lack of Transportation (Non-Medical): Yes  Physical Activity: Not on file  Stress: Not on file  Social Connections: Not on file   Past Medical History:  Past Medical History:  Diagnosis Date   Cancer associated pain    GERD (gastroesophageal reflux disease)    Tonsil cancer (HCC)     Past Surgical History:  Procedure Laterality Date   LARYNGOSCOPY Left 01/24/2022   Procedure: DIRECT LARYNGOSCOPY WITH TONSIL BIOPSY;  Surgeon: Herminio Miu, MD;  Location: [facility] ORS;  Service: ENT;  Laterality: Left;   PORT-A-CATH REMOVAL N/A 02/04/2021   Procedure: REMOVAL PORT-A-CATH;  Surgeon: Desiderio Schanz, MD;  Location: [facility];  Service: General;  Laterality: N/A;   PORTA CATH INSERTION N/A 10/25/2020   Procedure: PORTA CATH INSERTION;  Surgeon: Marea Selinda RAMAN, MD;  Location: [facility] INVASIVE CV [facility];  Service: Cardiovascular;  Laterality: N/A;   PORTA CATH INSERTION N/A 02/09/2022   Procedure: PORTA CATH INSERTION;  Surgeon: Marea Selinda RAMAN, MD;  Location: [facility]  INVASIVE CV LAB;  Service: Cardiovascular;  Laterality: N/A;   REMOVAL OF GASTROSTOMY TUBE N/A 02/04/2021   Procedure: REMOVAL OF GASTROSTOMY TUBE;  Surgeon: Desiderio Schanz, MD;  Location: [facility] OR[facility];  Service: General;  Laterality: N/A;   TONGUE BIOPSY Left 01/24/2022   Procedure: TONGUE BIOPSY;  Surgeon: Herminio Miu, MD;  Location: [facility] OR[facility];  Service: ENT;  Laterality: Left;    Current Medications: Current Facility-Administered Medications  Medication Dose Route Frequency Provider Last Rate Last Admin   acetaminophen  (TYLENOL ) tablet 650 mg  650 mg Oral Q6H PRN McLauchlin, Angela, NP   650 mg at 06/20/24 0813   chlordiazePOXIDE  (LIBRIUM ) capsule 25 mg  25 mg Oral Q6H PRN Jadapalle, Sree, MD       chlordiazePOXIDE  (LIBRIUM ) capsule 25 mg  25 mg Oral TID Jadapalle, Sree, MD   25 mg at 06/20/24 1235   Followed by   NOREEN ON 06/21/2024] chlordiazePOXIDE  (LIBRIUM ) capsule 25 mg  25 mg Oral Letha Donnelly Mellow, MD       Followed by   NOREEN ON 06/23/2024] chlordiazePOXIDE  (LIBRIUM ) capsule 25 mg  25 mg Oral Daily Jadapalle, Sree, MD       DULoxetine  (CYMBALTA ) DR capsule 30 mg  30 mg Oral Daily Cleotilde Hoy HERO, NP   30 mg at 06/20/24 9187   gabapentin  (NEURONTIN ) capsule 300 mg  300 mg Oral TID Cleotilde Hoy HERO, NP   300 mg at 06/20/24 1235   hydrOXYzine  (ATARAX ) tablet 25 mg  25 mg Oral Q6H PRN Jadapalle, Sree, MD   25 mg at 06/20/24 9187   loperamide  (IMODIUM ) capsule 2-4 mg  2-4 mg Oral PRN Jadapalle, Sree, MD       multivitamin with minerals tablet 1 tablet  1 tablet Oral Daily Jadapalle, Sree, MD   1 tablet at 06/20/24 9187   nicotine  (NICODERM CQ  - dosed in mg/24 hours) patch 14 mg  14 mg Transdermal Daily Cleotilde Hoy HERO, NP   14 mg at 06/20/24 9188   OLANZapine  (ZYPREXA ) injection 10 mg  10 mg Intramuscular TID PRN Jadapalle, Sree, MD       OLANZapine  zydis (ZYPREXA ) disintegrating tablet 5 mg  5 mg Oral TID PRN Jadapalle, Sree, MD       ondansetron  (ZOFRAN -ODT) disintegrating  tablet 4 mg  4 mg Oral Q6H PRN Jadapalle, Sree, MD       traZODone  (DESYREL ) tablet 50 mg  50 mg Oral QHS McLauchlin, Angela, NP   50 mg at 06/18/24 2207   Facility-Administered Medications Ordered in Other Encounters  Medication Dose Route Frequency Provider Last Rate Last Admin   heparin  lock flush 100 UNIT/ML injection            heparin  lock flush 100 UNIT/ML injection            sodium chloride  flush (NS) 0.9 % injection 10 mL  10 mL Intravenous PRN Melanee Annah BROCKS, MD   10 mL at 04/05/22 1112  Lab Results:  Results for orders placed or performed during the hospital encounter of 06/18/24 (from the past 48 hours)  Hemoglobin A1c     Status: None   Collection Time: 06/19/24  7:04 AM  Result Value Ref Range   Hgb A1c MFr Bld 5.6 4.8 - 5.6 %    Comment: (NOTE)         Prediabetes: 5.7 - 6.4         Diabetes: >6.4         Glycemic control for adults with diabetes: <7.0    Mean Plasma Glucose 114 mg/dL    Comment: (NOTE) Performed At: [facility] [address] [place], [place] [id number] Jennette Shorter MD Ey:[id number]   Lipid panel     Status: None   Collection Time: 06/19/24  7:04 AM  Result Value Ref Range   Cholesterol 168 0 - 200 mg/dL   Triglycerides 62 <849 mg/dL   HDL 58 >59 mg/dL   Total CHOL/HDL Ratio 2.9 RATIO   VLDL 12 0 - 40 mg/dL   LDL Cholesterol 98 0 - 99 mg/dL    Comment:        Total Cholesterol/HDL:CHD Risk Coronary Heart Disease Risk Table                     Men   Women  1/2 Average Risk   3.4   3.3  Average Risk       5.0   4.4  2 X Average Risk   9.6   7.1  3 X Average Risk  23.4   11.0        Use the calculated Patient Ratio above and the CHD Risk Table to determine the patient's CHD Risk.        ATP III CLASSIFICATION (LDL):  <100     mg/dL   Optimal  899-870  mg/dL   Near or Above                    Optimal  130-159  mg/dL   Borderline  839-810  mg/dL   High  >809     mg/dL   Very High Performed at [facility], [address] [place], [place] [address]     Blood Alcohol level:  Lab Results  Component Value Date   [facility] <15 06/17/2024   ETH <10 10/05/2021    Metabolic Disorder Labs: Lab Results  Component Value Date   HGBA1C 5.6 06/19/2024   MPG 114 06/19/2024   MPG 108.28 10/05/2021   No results found for: PROLACTIN Lab Results  Component Value Date   CHOL 168 06/19/2024   TRIG 62 06/19/2024   HDL 58 06/19/2024   CHOLHDL 2.9 06/19/2024   VLDL 12 06/19/2024   LDLCALC 98 06/19/2024   LDLCALC 133 (H) 10/05/2021    Physical Findings: AIMS:  , ,  ,  ,    CIWA:  CIWA-Ar Total: 2 COWS:      Psychiatric Specialty Exam: ppearance: fair hygiene casually dressed. Behavior: Cooperative, somewhat withdrawn. Speech: Clear, normal rate, tone, and volume. Mood: depressed Affect: Flat, congruent with mood. Thought Process: Linear, goal-directed. Thought Content: Passive suicidal ideation; no plan or intent. Denies hallucinations, paranoia, or delusions. Insight: Limited. Judgment: Fair but impaired by depression. Orientation: Fully oriented. Memory: Intact. Concentration: Adequate. Impulse Control: Fair.   Musculoskeletal: Strength & Muscle Tone: within normal limits Gait & Station: normal Assets  Assets:No data recorded   Physical  Exam: Physical Exam ROS Blood pressure (!) 115/96, pulse 98, temperature 97.7 F (36.5 C), resp. rate 18, height 5' 8 (1.727 m), weight 64 kg, SpO2 100%. Body mass index is 21.44 kg/m.  Diagnosis: Principal Problem:   MDD (major depressive disorder), recurrent severe, without psychosis (HCC)   PLAN: Safety and Monitoring:  -- Voluntary admission to inpatient psychiatric unit for safety, stabilization and treatment  -- Daily contact with patient to assess and evaluate symptoms and progress in treatment  -- Patient's case to be discussed in multi-disciplinary team meeting  -- Observation Level : q15 minute checks  -- Vital signs:  q12  hours  -- Precautions: suicide, elopement, and assault -- Encouraged patient to participate in unit milieu and in scheduled group therapies  2. Psychiatric Diagnoses and Treatment:  53 year old male with history of major depressive disorder, prior suicide attempt, polysubstance use, and throat cancer presents with worsening depressive symptoms, passive SI, and significant psychosocial stressors. No evidence of mania or psychosis. He demonstrates poor support, housing instability, and ongoing stress from parole and relationship loss.  Presentation is most consistent with Major Depressive Disorder without psychotic features. Anxiety symptoms also present.  Risk Stratification: Risk factors: Past suicide attempt, chronic depression, substance use (alcohol, cocaine ), homelessness, medical comorbidities, poor support. Protective factors: Insight into need for help, willingness to engage in care, contact with one supportive sibling. Suicide risk level: Moderate, based on history, current passive SI, and psychosocial context.    Plan: Initiate Duloxetine  (Cymbalta ) for depression and chronic pain. Start Gabapentin  300 mg PO TID for anxiety, mood stabilization, and pain. Continue CIWA protocol with Librium  taper. Continue to monitor mood, anxiety, and SI closely. Engage in supportive psychotherapy and structured milieu therapy. Coordinate with parole officer and social work for discharge planning and housing stability.  Encourage abstinence from alcohol and cocaine ; discuss referral to substance treatment resources.   -- The risks/benefits/side-effects/alternatives to this medication were discussed in detail with the patient and time was given for questions. The patient consents to medication trial.                -- Metabolic profile and EKG monitoring obtained while on an atypical antipsychotic (BMI: Lipid Panel: HbgA1c: QTc:)              -- Encouraged patient to participate in unit milieu and  in scheduled group therapies                            3. Medical Issues Being Addressed: history of throat CA, keep trach site covered with clean guaze        4. Discharge Planning:   -- Social work and case management to assist with discharge planning and identification of hospital follow-up needs prior to discharge  -- Estimated LOS: 3-4 days  Hoy CHRISTELLA Pinal, NP 06/20/2024, 4:07 PM

## 2024-06-20 NOTE — Group Note (Signed)
 Date:  06/20/2024 Time:  7:14 PM  Group Topic/Focus:  Making Healthy Choices:   The focus of this group is to help patients identify negative/unhealthy choices they were using prior to admission and identify positive/healthier coping strategies to replace them upon discharge.    Participation Level:  Active  Participation Quality:  Appropriate  Affect:  Appropriate  Cognitive:  Appropriate  Insight: Appropriate  Engagement in Group:  Engaged  Modes of Intervention:  Activity  Additional Comments:    Camellia HERO Cha Gomillion 06/20/2024, 7:14 PM

## 2024-06-20 NOTE — Group Note (Signed)
 Recreation Therapy Group Note   Group Topic:Leisure Education  Group Date: 06/20/2024 Start Time: 1040 End Time: 1140 Facilitators: Celestia Jeoffrey BRAVO, LRT, CTRS Location: Craft Room  Group Description: Leisure. Patients were given the option to choose from journaling, coloring, drawing, making origami, playing with playdoh, listening to music or singing karaoke. LRT and pts discussed the meaning of leisure, the importance of participating in leisure during their free time/when they're outside of the hospital, as well as how our leisure interests can also serve as coping skills.   Goal Area(s) Addressed:  Patient will identify a current leisure interest.  Patient will learn the definition of "leisure". Patient will practice making a positive decision. Patient will have the opportunity to try a new leisure activity. Patient will communicate with peers and LRT.   Affect/Mood: Appropriate   Participation Level: Active and Engaged   Participation Quality: Independent   Behavior: Appropriate, Calm, and Cooperative   Speech/Thought Process: Coherent   Insight: Good   Judgement: Good   Modes of Intervention: Education, Exploration, Music, and Socialization   Patient Response to Interventions:  Attentive, Engaged, Interested , and Receptive   Education Outcome:  Acknowledges education   Clinical Observations/Individualized Feedback: Susie was active in their participation of session activities and group discussion. Pt identified hike and fish as things he does in his free time. Pt chose to journal while in group.    Plan: Continue to engage patient in RT group sessions 2-3x/week.   Jeoffrey BRAVO Celestia, LRT, CTRS 06/20/2024 1:27 PM

## 2024-06-20 NOTE — Progress Notes (Signed)
   06/20/24 1300  Psych Admission Type (Psych Patients Only)  Admission Status Voluntary  Psychosocial Assessment  Patient Complaints Anxiety;Depression;Worrying  Eye Contact Fair  Facial Expression Animated;Worried  Affect Appropriate to circumstance  Surveyor, minerals Activity Other (Comment) (appropriate for developmental age)  Appearance/Hygiene Unremarkable  Behavior Characteristics Cooperative  Mood Anxious;Depressed;Pleasant (Patient writes his goal for today is to work on my emotions. He writes he will pray and hope the meds y'all try work.)  Thought Scientist, water quality WDL  Content WDL  Delusions None reported or observed  Perception WDL  Hallucination None reported or observed  Judgment Poor (improving)  Confusion None  Danger to Self  Current suicidal ideation? Denies  Danger to Others  Danger to Others None reported or observed

## 2024-06-20 NOTE — Plan of Care (Signed)
  Problem: Education: Goal: Emotional status will improve Outcome: Progressing Goal: Mental status will improve Outcome: Progressing Goal: Verbalization of understanding the information provided will improve Outcome: Progressing   Problem: Coping: Goal: Ability to verbalize frustrations and anger appropriately will improve Outcome: Progressing   Problem: Health Behavior/Discharge Planning: Goal: Compliance with treatment plan for underlying cause of condition will improve Outcome: Progressing   Problem: Physical Regulation: Goal: Ability to maintain clinical measurements within normal limits will improve Outcome: Progressing   Problem: Safety: Goal: Periods of time without injury will increase Outcome: Progressing

## 2024-06-20 NOTE — BH IP Treatment Plan (Signed)
 Interdisciplinary Treatment and Diagnostic Plan Update  06/20/2024 Time of Session: 10:25 AM Paul Hebert MRN: [id number]  Principal Diagnosis: MDD (major depressive disorder), recurrent severe, without psychosis (HCC)  Secondary Diagnoses: Principal Problem:   MDD (major depressive disorder), recurrent severe, without psychosis (HCC)   Current Medications:  Current Facility-Administered Medications  Medication Dose Route Frequency Provider Last Rate Last Admin   acetaminophen  (TYLENOL ) tablet 650 mg  650 mg Oral Q6H PRN McLauchlin, Angela, NP   650 mg at 06/20/24 0813   chlordiazePOXIDE  (LIBRIUM ) capsule 25 mg  25 mg Oral Q6H PRN Jadapalle, Sree, MD       chlordiazePOXIDE  (LIBRIUM ) capsule 25 mg  25 mg Oral TID Jadapalle, Sree, MD       Followed by   NOREEN ON 06/21/2024] chlordiazePOXIDE  (LIBRIUM ) capsule 25 mg  25 mg Oral BH-qamhs Jadapalle, Sree, MD       Followed by   NOREEN ON 06/23/2024] chlordiazePOXIDE  (LIBRIUM ) capsule 25 mg  25 mg Oral Daily Jadapalle, Sree, MD       DULoxetine  (CYMBALTA ) DR capsule 30 mg  30 mg Oral Daily Cleotilde Hoy HERO, NP   30 mg at 06/20/24 9187   gabapentin  (NEURONTIN ) capsule 300 mg  300 mg Oral TID Cleotilde Hoy HERO, NP   300 mg at 06/20/24 9187   hydrOXYzine  (ATARAX ) tablet 25 mg  25 mg Oral Q6H PRN Jadapalle, Sree, MD   25 mg at 06/20/24 9187   loperamide  (IMODIUM ) capsule 2-4 mg  2-4 mg Oral PRN Jadapalle, Sree, MD       multivitamin with minerals tablet 1 tablet  1 tablet Oral Daily Jadapalle, Sree, MD   1 tablet at 06/20/24 9187   nicotine  (NICODERM CQ  - dosed in mg/24 hours) patch 14 mg  14 mg Transdermal Daily Cleotilde Hoy HERO, NP   14 mg at 06/20/24 9188   OLANZapine  (ZYPREXA ) injection 10 mg  10 mg Intramuscular TID PRN Jadapalle, Sree, MD       OLANZapine  zydis (ZYPREXA ) disintegrating tablet 5 mg  5 mg Oral TID PRN Jadapalle, Sree, MD       ondansetron  (ZOFRAN -ODT) disintegrating tablet 4 mg  4 mg Oral Q6H PRN Jadapalle, Sree, MD        traZODone  (DESYREL ) tablet 50 mg  50 mg Oral QHS McLauchlin, Angela, NP   50 mg at 06/18/24 2207   Facility-Administered Medications Ordered in Other Encounters  Medication Dose Route Frequency Provider Last Rate Last Admin   heparin  lock flush 100 UNIT/ML injection            heparin  lock flush 100 UNIT/ML injection            sodium chloride  flush (NS) 0.9 % injection 10 mL  10 mL Intravenous PRN Melanee Annah BROCKS, MD   10 mL at 04/05/22 1112   PTA Medications: Medications Prior to Admission  Medication Sig Dispense Refill Last Dose/Taking   calcium carbonate (TUMS - DOSED IN MG ELEMENTAL CALCIUM) 500 MG chewable tablet Chew 1 tablet by mouth as needed for indigestion or heartburn.      dexamethasone  (DECADRON ) 4 MG tablet Take 4 mg by mouth 2 (two) times daily.      DULoxetine  (CYMBALTA ) 60 MG capsule Take 1 capsule (60 mg total) by mouth daily. 30 capsule 3    fentaNYL  (DURAGESIC ) 25 MCG/HR Place 1 patch onto the skin every 3 (three) days. 5 patch 0    gabapentin  (NEURONTIN ) 800 MG tablet TAKE 1 TABLET BY MOUTH 3  TIMES DAILY. 42 tablet 0    IBU 600 MG tablet Take 600 mg by mouth 3 (three) times daily.      lidocaine  (XYLOCAINE ) 2 % solution USE 15 MLS IN THE MOUTH OR THROAT EVERY 4 HOURS AS NEEDED FOR MOUTH PAIN AS DIRECTED 600 mL 0    magic mouthwash w/lidocaine  SOLN Take 5 mLs by mouth 4 (four) times daily as needed for mouth pain. 210 mL 3    nicotine  (NICODERM CQ  - DOSED IN MG/24 HR) 7 mg/24hr patch 7 mg daily.      ondansetron  (ZOFRAN ) 8 MG tablet Take 1 tablet (8 mg total) by mouth 2 (two) times daily as needed. Start on the third day after cisplatin  chemotherapy. 30 tablet 1    Oxycodone  HCl 10 MG TABS TAKE ONE TABLET BY MOUTH 3 TIMES DAILY 42 tablet 0    pantoprazole  (PROTONIX ) 20 MG tablet Take 1 tablet (20 mg total) by mouth daily. 30 tablet 3    sucralfate  (CARAFATE ) 1 g tablet Take 1 tablet (1 g total) by mouth 3 (three) times daily before meals. 90 tablet 6     Patient  Stressors: Financial difficulties   Health problems   Marital or family conflict   Substance abuse    Patient Strengths: Average or above average Copy for treatment/growth  Religious Affiliation   Treatment Modalities: Medication Management, Group therapy, Case management,  1 to 1 session with clinician, Psychoeducation, Recreational therapy.   Physician Treatment Plan for Primary Diagnosis: MDD (major depressive disorder), recurrent severe, without psychosis (HCC) Long Term Goal(s): Improvement in symptoms so as ready for discharge   Short Term Goals: Ability to identify changes in lifestyle to reduce recurrence of condition will improve  Medication Management: Evaluate patient's response, side effects, and tolerance of medication regimen.  Therapeutic Interventions: 1 to 1 sessions, Unit Group sessions and Medication administration.  Evaluation of Outcomes: Not Met  Physician Treatment Plan for Secondary Diagnosis: Principal Problem:   MDD (major depressive disorder), recurrent severe, without psychosis (HCC)  Long Term Goal(s): Improvement in symptoms so as ready for discharge   Short Term Goals: Ability to identify changes in lifestyle to reduce recurrence of condition will improve     Medication Management: Evaluate patient's response, side effects, and tolerance of medication regimen.  Therapeutic Interventions: 1 to 1 sessions, Unit Group sessions and Medication administration.  Evaluation of Outcomes: Not Met   RN Treatment Plan for Primary Diagnosis: MDD (major depressive disorder), recurrent severe, without psychosis (HCC) Long Term Goal(s): Knowledge of disease and therapeutic regimen to maintain health will improve  Short Term Goals: Ability to verbalize frustration and anger appropriately will improve, Ability to demonstrate self-control, Ability to participate in decision making will improve, Ability to verbalize  feelings will improve, Ability to disclose and discuss suicidal ideas, and Ability to identify and develop effective coping behaviors will improve  Medication Management: RN will administer medications as ordered by provider, will assess and evaluate patient's response and provide education to patient for prescribed medication. RN will report any adverse and/or side effects to prescribing provider.  Therapeutic Interventions: 1 on 1 counseling sessions, Psychoeducation, Medication administration, Evaluate responses to treatment, Monitor vital signs and CBGs as ordered, Perform/monitor CIWA, COWS, AIMS and Fall Risk screenings as ordered, Perform wound care treatments as ordered.  Evaluation of Outcomes: Not Met   LCSW Treatment Plan for Primary Diagnosis: MDD (major depressive disorder), recurrent severe, without psychosis (HCC) Long Term Goal(s): Safe  transition to appropriate next level of care at discharge, Engage patient in therapeutic group addressing interpersonal concerns.  Short Term Goals: Engage patient in aftercare planning with referrals and resources, Increase social support, Increase ability to appropriately verbalize feelings, Increase emotional regulation, Facilitate acceptance of mental health diagnosis and concerns, Facilitate patient progression through stages of change regarding substance use diagnoses and concerns, Identify triggers associated with mental health/substance abuse issues, and Increase skills for wellness and recovery  Therapeutic Interventions: Assess for all discharge needs, 1 to 1 time with Social worker, Explore available resources and support systems, Assess for adequacy in community support network, Educate family and significant other(s) on suicide prevention, Complete Psychosocial Assessment, Interpersonal group therapy.  Evaluation of Outcomes: Not Met   Progress in Treatment: Attending groups: Yes. Participating in groups: Yes. Taking medication as  prescribed: Yes. Toleration medication: Yes. Family/Significant other contact made: No, will contact:  CSW to contact once permission is granted.  Patient understands diagnosis: Yes. Discussing patient identified problems/goals with staff: Yes. Medical problems stabilized or resolved: Yes. Denies suicidal/homicidal ideation: Yes. Issues/concerns per patient self-inventory: No. Other: None  New problem(s) identified: No, Describe:  None  New Short Term/Long Term Goal(s):detox, elimination of symptoms of psychosis, medication management for mood stabilization; elimination of SI thoughts; development of comprehensive mental wellness/sobriety plan.    Patient Goals:  Get my mood adjusted.  Discharge Plan or Barriers: CSW to assist with the development of appropriate discharge plan.    Reason for Continuation of Hospitalization: Anxiety Delusions  Depression Hallucinations Suicidal ideation  Estimated Length of Stay: 1-7 days.   Last 3 [place] Suicide Severity Risk Score: Flowsheet Row Admission (Current) from 06/18/2024 in [facility] INPATIENT BEH[facility]ICINE ED from 06/17/2024 in [facility] Emergency Department at [facility] ED from 05/21/2023 in [facility] Emergency Department at [facility]  C-SSRS RISK CATEGORY Low Risk High Risk No Risk    Last [facility] 2/9 Scores:    10/06/2021    3:18 AM  Depression screen PHQ 2/9  Decreased Interest 1  Down, Depressed, Hopeless 2  PHQ - 2 Score 3  Altered sleeping 2  Tired, decreased energy 1  Change in appetite 1  Feeling bad or failure about yourself  1  Trouble concentrating 1  Moving slowly or fidgety/restless 0  Suicidal thoughts 1  PHQ-9 Score 10  Difficult doing work/chores Very difficult    Scribe for Treatment Team: Alveta CHRISTELLA Kerns, LCSW 06/20/2024 10:34 AM

## 2024-06-20 NOTE — [facility] Counselor (Signed)
 CSW provided patient with residential treatment list as well as shelter list as patient reported that he did not want to return to [facility].   CSW to continue to assess.   Casia Corti, MSW, LCSWA 06/20/2024 1:39 PM

## 2024-06-20 NOTE — Group Note (Signed)
 Date:  06/20/2024 Time:  7:18 PM  Group Topic/Focus:  Spirituality:   The focus of this group is to discuss how one's spirituality can aide in recovery.    Participation Level:  Active  Participation Quality:  Appropriate  Affect:  Appropriate  Cognitive:  Appropriate  Insight: Appropriate  Engagement in Group:  Engaged  Modes of Intervention:  Activity  Additional Comments:    Camellia HERO Renesme Kerrigan 06/20/2024, 7:18 PM

## 2024-06-20 NOTE — Plan of Care (Signed)
   Problem: Education: Goal: Emotional status will improve Outcome: Progressing Goal: Mental status will improve Outcome: Progressing

## 2024-06-21 NOTE — Group Note (Signed)
 Date:  06/21/2024 Time:  4:22 AM    Additional Comments:  Did not attend the group.  Butler LITTIE Gelineau 06/21/2024, 4:22 AM

## 2024-06-21 NOTE — Group Note (Signed)
 Date:  06/21/2024 Time:  3:57 PM  Group Topic/Focus:  Goals Group:   The focus of this group is to help patients establish daily goals to achieve during treatment and discuss how the patient can incorporate goal setting into their daily lives to aide in recovery.    Participation Level:  Did Not Attend   Deitra Clap [facility] 06/21/2024, 3:57 PM

## 2024-06-21 NOTE — Group Note (Signed)
 Date:  06/21/2024 Time:  4:02 PM  Group Topic/Focus:  Wellness Toolbox:   The focus of this group is to discuss various aspects of wellness, balancing those aspects and exploring ways to increase the ability to experience wellness.  Patients will create a wellness toolbox for use upon discharge.    Participation Level:  Did Not Attend   Paul Hebert [facility] 06/21/2024, 4:02 PM

## 2024-06-21 NOTE — Group Note (Signed)
 [facility] LCSW Group Therapy Note   Group Date: 06/21/2024 Start Time: 1300 End Time: 1400   Type of Therapy/Topic:  Group Therapy:  Emotion Regulation  Participation Level:  Did Not Attend   Mood:  Description of Group:    The purpose of this group is to assist patients in learning to regulate negative emotions and experience positive emotions. Patients will be guided to discuss ways in which they have been vulnerable to their negative emotions. These vulnerabilities will be juxtaposed with experiences of positive emotions or situations, and patients challenged to use positive emotions to combat negative ones. Special emphasis will be placed on coping with negative emotions in conflict situations, and patients will process healthy conflict resolution skills.  Therapeutic Goals: Patient will identify two positive emotions or experiences to reflect on in order to balance out negative emotions:  Patient will label two or more emotions that they find the most difficult to experience:  Patient will be able to demonstrate positive conflict resolution skills through discussion or role plays:   Summary of Patient Progress:   Patient did not attend.     Therapeutic Modalities:   Cognitive Behavioral Therapy Feelings Identification Dialectical Behavioral Therapy   Alveta CHRISTELLA Kerns, LCSW

## 2024-06-21 NOTE — Plan of Care (Signed)
   Problem: Education: Goal: Emotional status will improve Outcome: Progressing Goal: Mental status will improve Outcome: Progressing

## 2024-06-21 NOTE — Group Note (Signed)
 Date:  06/21/2024 Time:  10:03 PM  Group Topic/Focus:  Developing a Wellness Toolbox:   The focus of this group is to help patients develop a wellness toolbox with skills and strategies to promote recovery upon discharge. Emotional Education:   The focus of this group is to discuss what feelings/emotions are, and how they are experienced.    Participation Level:  Active  Participation Quality:  Appropriate  Affect:  Appropriate  Cognitive:  Appropriate  Insight: Appropriate  Engagement in Group:  Engaged  Modes of Intervention:  Education and Support  Additional Comments:    Cyriah Childrey L 06/21/2024, 10:03 PM

## 2024-06-21 NOTE — Progress Notes (Signed)
   06/21/24 1000  Psych Admission Type (Psych Patients Only)  Admission Status Voluntary  Psychosocial Assessment  Patient Complaints Anxiety;Depression  Eye Contact Fair  Facial Expression Animated  Affect Appropriate to circumstance  Speech Logical/coherent  Interaction Assertive  Motor Activity Slow  Appearance/Hygiene Unremarkable  Behavior Characteristics Cooperative;Appropriate to situation  Mood Anxious  Thought Process  Coherency WDL  Content WDL  Delusions None reported or observed  Perception WDL  Hallucination None reported or observed  Judgment Impaired  Confusion None  Danger to Self  Current suicidal ideation? Denies  Danger to Others  Danger to Others None reported or observed

## 2024-06-22 DIAGNOSIS — F332 Major depressive disorder, recurrent severe without psychotic features: Secondary | ICD-10-CM | POA: Diagnosis not present

## 2024-06-22 MED ORDER — DULOXETINE HCL 30 MG PO CPEP
60.0000 mg | ORAL_CAPSULE | Freq: Every day | ORAL | Status: DC
  Administered 2024-06-23 – 2024-06-25 (×3): 60 mg via ORAL
  Filled 2024-06-22 (×3): qty 2

## 2024-06-22 MED ORDER — HYDROXYZINE HCL 25 MG PO TABS
25.0000 mg | ORAL_TABLET | Freq: Three times a day (TID) | ORAL | Status: DC | PRN
  Administered 2024-06-22 – 2024-06-23 (×3): 25 mg via ORAL
  Filled 2024-06-22 (×4): qty 1

## 2024-06-22 MED ORDER — MAGIC MOUTHWASH
5.0000 mL | Freq: Three times a day (TID) | ORAL | Status: DC
  Administered 2024-06-23 – 2024-06-25 (×8): 5 mL via ORAL
  Filled 2024-06-22 (×11): qty 5

## 2024-06-22 NOTE — Plan of Care (Signed)
   Problem: Education: Goal: Emotional status will improve Outcome: Progressing Goal: Mental status will improve Outcome: Progressing

## 2024-06-22 NOTE — [facility] Suicide Risk Assessment (Signed)
 [facility] INPATIENT:  Family/Significant Other Suicide Prevention Education  Suicide Prevention Education:  Education Completed; Cabot Cromartie, brother, [phone])  (name of family member/significant other) has been identified by the patient as the family member/significant other with whom the patient will be residing, and identified as the person(s) who will aid the patient in the event of a mental health crisis (suicidal ideations/suicide attempt).  With written consent from the patient, the family member/significant other has been provided the following suicide prevention education, prior to the and/or following the discharge of the patient.  The suicide prevention education provided includes the following: Suicide risk factors Suicide prevention and interventions National Suicide Hotline telephone number [facility] assessment telephone number [facility] Emergency Assistance 911 [facility] and/or Residential Mobile Crisis Unit telephone number  Request made of family/significant other to: Remove weapons (e.g., guns, rifles, knives), all items previously/currently identified as safety concern.   Remove drugs/medications (over-the-counter, prescriptions, illicit drugs), all items previously/currently identified as a safety concern.  The family member/significant other verbalizes understanding of the suicide prevention education information provided.  The family member/significant other agrees to remove the items of safety concern listed above.  Aldo CHRISTELLA Niece, MSW, LCSW 06/22/2024, 1:11 PM

## 2024-06-22 NOTE — Progress Notes (Signed)
   06/21/24 2000  Psych Admission Type (Psych Patients Only)  Admission Status Voluntary  Psychosocial Assessment  Patient Complaints Depression  Eye Contact Fair  Facial Expression Animated  Affect Appropriate to circumstance  Speech Logical/coherent  Interaction Assertive  Motor Activity Slow  Appearance/Hygiene Unremarkable  Behavior Characteristics Calm  Mood Anxious  Thought Process  Coherency WDL  Content WDL  Delusions None reported or observed  Perception WDL  Hallucination None reported or observed  Judgment Impaired  Confusion None  Danger to Self  Current suicidal ideation? Denies  Danger to Others  Danger to Others None reported or observed

## 2024-06-22 NOTE — Progress Notes (Signed)
 [facility] MD Progress Note  06/22/2024 1:39 PM Paul Hebert  MRN:  [id number]  Patient presented voluntarily for psychiatric evaluation due to severe depressive symptoms with passive suicidal ideation. He describes feeling overwhelmed and hopeless, stating, "Recently I've given up, like what's the point -- I'm 53 years old and my life is in shambles." Admission is indicated to stabilize mood and address risk in the setting of psychosocial stressors, chronic depression, and limited supports.   Subjective: Nursing reported the patient is reporting of feeling anxious and received hydroxyzine  yesterday.  Patient on interview reports that he has been doing well apart from his anxiety.  He reports that he is not suicidal.  Denies any homicidal thoughts.  Denies any auditory or visual hallucinations.  Patient reports that he is having pain in the mouth as he has received radiation therapy.  Will give patient Magic mouthwash to relieve his symptoms.    Sleep: Fair  Appetite:  Fair  Past Psychiatric History: see h&P Family History:  Family History  Problem Relation Age of Onset   Cancer Father    Social History:  Social History   Substance and Sexual Activity  Alcohol Use Not Currently     Social History   Substance and Sexual Activity  Drug Use Yes   Types: Marijuana, Crack cocaine , Methamphetamines   Comment: marijuana every day except the joint    Social History   Socioeconomic History   Marital status: Single    Spouse name: Not on file   Number of children: Not on file   Years of education: Not on file   Highest education level: Not on file  Occupational History   Not on file  Tobacco Use   Smoking status: Every Day    Current packs/day: 0.50    Average packs/day: 0.5 packs/day for 35.0 years (17.5 ttl pk-yrs)    Types: Cigarettes   Smokeless tobacco: Former   Tobacco comments:    Patches very rarely  Vaping Use   Vaping status: Former  Substance and Sexual Activity    Alcohol use: Not Currently   Drug use: Yes    Types: Marijuana, Crack cocaine , Methamphetamines    Comment: marijuana every day except the joint   Sexual activity: Not Currently  Other Topics Concern   Not on file  Social History Narrative   Not on file   Social Drivers of Health   Financial Resource Strain: Medium Risk (06/13/2022)   Received from [facility]   Overall Financial Resource Strain (CARDIA)    Difficulty of Paying Living Expenses: Somewhat hard  Food Insecurity: Food Insecurity Present (06/18/2024)   Hunger Vital Sign    Worried About Running Out of Food in the Last Year: Sometimes true    Ran Out of Food in the Last Year: Never true  Transportation Needs: Unmet Transportation Needs (06/18/2024)   PRAPARE - Administrator, Civil Service (Medical): Yes    Lack of Transportation (Non-Medical): Yes  Physical Activity: Not on file  Stress: Not on file  Social Connections: Not on file   Past Medical History:  Past Medical History:  Diagnosis Date   Cancer associated pain    GERD (gastroesophageal reflux disease)    Tonsil cancer (HCC)     Past Surgical History:  Procedure Laterality Date   LARYNGOSCOPY Left 01/24/2022   Procedure: DIRECT LARYNGOSCOPY WITH TONSIL BIOPSY;  Surgeon: Herminio Miu, MD;  Location: [facility] ORS;  Service: ENT;  Laterality: Left;   PORT-A-CATH  REMOVAL N/A 02/04/2021   Procedure: REMOVAL PORT-A-CATH;  Surgeon: Desiderio Schanz, MD;  Location: [facility] OR[facility];  Service: General;  Laterality: N/A;   PORTA CATH INSERTION N/A 10/25/2020   Procedure: PORTA CATH INSERTION;  Surgeon: Marea Selinda RAMAN, MD;  Location: [facility] INVASIVE CV [facility];  Service: Cardiovascular;  Laterality: N/A;   PORTA CATH INSERTION N/A 02/09/2022   Procedure: PORTA CATH INSERTION;  Surgeon: Marea Selinda RAMAN, MD;  Location: [facility] INVAS[facility] CV LAB;  Service: Cardiovascular;  Laterality: N/A;   REMOVAL OF GASTROSTOMY TUBE N/A 02/04/2021   Procedure: REMOVAL OF GASTROSTOMY TUBE;  Surgeon:  Desiderio Schanz, MD;  Location: [facility] OR[facility];  Service: General;  Laterality: N/A;   TONGUE BIOPSY Left 01/24/2022   Procedure: TONGUE BIOPSY;  Surgeon: Herminio Miu, MD;  Location: [facility] ORS;  Service: ENT;  Laterality: Left;    Current Medications: Current Facility-Administered Medications  Medication Dose Route Frequency Provider Last Rate Last Admin   acetaminophen  (TYLENOL ) tablet 650 mg  650 mg Oral Q6H PRN McLauchlin, Angela, NP   650 mg at 06/21/24 1747   [START ON 06/23/2024] chlordiazePOXIDE  (LIBRIUM ) capsule 25 mg  25 mg Oral Daily Jadapalle, Sree, MD       DULoxetine  (CYMBALTA ) DR capsule 30 mg  30 mg Oral Daily Cleotilde Hoy HERO, NP   30 mg at 06/22/24 0830   gabapentin  (NEURONTIN ) capsule 300 mg  300 mg Oral TID Cleotilde Hoy HERO, NP   300 mg at 06/22/24 1235   hydrOXYzine  (ATARAX ) tablet 25 mg  25 mg Oral TID PRN Munir Victorian, MD       magic mouthwash  5 mL Oral TID Leathia Farnell, MD       multivitamin with minerals tablet 1 tablet  1 tablet Oral Daily Jadapalle, Sree, MD   1 tablet at 06/22/24 0831   nicotine  (NICODERM CQ  - dosed in mg/24 hours) patch 14 mg  14 mg Transdermal Daily Cleotilde Hoy HERO, NP   14 mg at 06/22/24 0830   OLANZapine  (ZYPREXA ) injection 10 mg  10 mg Intramuscular TID PRN Jadapalle, Sree, MD       OLANZapine  zydis (ZYPREXA ) disintegrating tablet 5 mg  5 mg Oral TID PRN Jadapalle, Sree, MD       traZODone  (DESYREL ) tablet 50 mg  50 mg Oral QHS McLauchlin, Angela, NP   50 mg at 06/21/24 2115   Facility-Administered Medications Ordered in Other Encounters  Medication Dose Route Frequency Provider Last Rate Last Admin   heparin  lock flush 100 UNIT/ML injection            heparin  lock flush 100 UNIT/ML injection            sodium chloride  flush (NS) 0.9 % injection 10 mL  10 mL Intravenous PRN Rao, Archana C, MD   10 mL at 04/05/22 1112    Lab Results:  No results found for this or any previous visit (from the past 48 hours).   Blood  Alcohol level:  Lab Results  Component Value Date   [facility] <15 06/17/2024   ETH <10 10/05/2021    Metabolic Disorder Labs: Lab Results  Component Value Date   HGBA1C 5.6 06/19/2024   MPG 114 06/19/2024   MPG 108.28 10/05/2021   No results found for: PROLACTIN Lab Results  Component Value Date   CHOL 168 06/19/2024   TRIG 62 06/19/2024   HDL 58 06/19/2024   CHOLHDL 2.9 06/19/2024   VLDL 12 06/19/2024   LDLCALC 98 06/19/2024   LDLCALC 133 (  H) 10/05/2021    Physical Findings: AIMS:  , ,  ,  ,    CIWA:  CIWA-Ar Total: 2 COWS:      Psychiatric Specialty Exam: ppearance: fair hygiene casually dressed. Behavior: Cooperative, somewhat withdrawn. Speech: Clear, normal rate, tone, and volume. Mood: Anxious Affect: Congruent with the mood Thought Process: Linear, goal-directed. Thought Content: No suicidal thoughts.  No homicidal thoughts no AVH Insight: Fair Judgment: Fair  Orientation: Fully oriented. Memory: Intact. Concentration: Adequate. Impulse Control: Fair.   Musculoskeletal: Strength & Muscle Tone: within normal limits Gait & Station: normal Assets  Assets:No data recorded   Physical Exam: Physical Exam ROS Blood pressure 118/87, pulse 84, temperature (!) 97.1 F (36.2 C), resp. rate 20, height 5' 8 (1.727 m), weight 64 kg, SpO2 99%. Body mass index is 21.44 kg/m.  Diagnosis: Principal Problem:   MDD (major depressive disorder), recurrent severe, without psychosis (HCC)   PLAN: Safety and Monitoring:  -- Voluntary admission to inpatient psychiatric unit for safety, stabilization and treatment  -- Daily contact with patient to assess and evaluate symptoms and progress in treatment  -- Patient's case to be discussed in multi-disciplinary team meeting  -- Observation Level : q15 minute checks  -- Vital signs:  q12 hours  -- Precautions: suicide, elopement, and assault -- Encouraged patient to participate in unit milieu and in scheduled group  therapies  2. Psychiatric Diagnoses and Treatment:   Increase Cymbalta  to 60 mg daily for pain, anxiety, mood Continue gabapentin  300 mg PO TID for anxiety, mood stabilization, and pain. Continue CIWA protocol with Librium  taper. Continue to monitor mood, anxiety, and SI closely. Engage in supportive psychotherapy and structured milieu therapy. Coordinate with parole officer and social work for discharge planning and housing stability.  Encourage abstinence from alcohol and cocaine ; discuss referral to substance treatment resources.   -- The risks/benefits/side-effects/alternatives to this medication were discussed in detail with the patient and time was given for questions. The patient consents to medication trial.                -- Metabolic profile and EKG monitoring obtained while on an atypical antipsychotic (BMI: Lipid Panel: HbgA1c: QTc:)              -- Encouraged patient to participate in unit milieu and in scheduled group therapies                            3. Medical Issues Being Addressed: history of throat CA, keep trach site covered with clean guaze    Magic mouthwash    4. Discharge Planning:   -- Social work and case management to assist with discharge planning and identification of hospital follow-up needs prior to discharge  -- Estimated LOS: 3-4 days  Desmond Chimera, MD 06/22/2024, 1:39 PM

## 2024-06-22 NOTE — Progress Notes (Signed)
   06/22/24 1100  Psych Admission Type (Psych Patients Only)  Admission Status Voluntary  Psychosocial Assessment  Patient Complaints Anxiety;Depression  Eye Contact Fair  Facial Expression Animated  Affect Appropriate to circumstance  Speech Logical/coherent  Interaction Assertive  Motor Activity Slow  Appearance/Hygiene Unremarkable  Behavior Characteristics Calm  Mood Anxious  Thought Process  Coherency WDL  Content WDL  Delusions None reported or observed  Perception WDL  Hallucination None reported or observed  Judgment Impaired  Confusion None  Danger to Self  Current suicidal ideation? Denies  Danger to Others  Danger to Others None reported or observed

## 2024-06-22 NOTE — Group Note (Unsigned)
 LCSW Group Therapy Note  Group Date: 06/22/2024 Start Time: 1310 End Time: 1400   Type of Therapy and Topic:  Group Therapy - Healthy vs Unhealthy Coping Skills  Participation Level:  {[facility] PARTICIPATION [id number]}   Description of Group The focus of this group was to determine what unhealthy coping techniques typically are used by group members and what healthy coping techniques would be helpful in coping with various problems. Patients were guided in becoming aware of the differences between healthy and unhealthy coping techniques. Patients were asked to identify 2-3 healthy coping skills they would like to learn to use more effectively.  Therapeutic Goals 1. Patients learned that coping is what human beings do all day long to deal with various situations in their lives 2. Patients defined and discussed healthy vs unhealthy coping techniques 3. Patients identified their preferred coping techniques and identified whether these were healthy or unhealthy 4. Patients determined 2-3 healthy coping skills they would like to become more familiar with and use more often. 5. Patients provided support and ideas to each other   Summary of Patient Progress:  During group, *** expressed ***. Patient proved open to input from peers and feedback from CSW. Patient demonstrated *** insight into the subject matter, was respectful of peers, and participated throughout the entire session.   Therapeutic Modalities Cognitive Behavioral Therapy Motivational Interviewing  Aldo CHRISTELLA Niece, [place] 06/22/2024  2:53 PM

## 2024-06-22 NOTE — Plan of Care (Signed)
   Problem: Education: Goal: Knowledge of [place] [facility] Education information/materials will improve Outcome: Progressing Goal: Emotional status will improve Outcome: Progressing Goal: Mental status will improve Outcome: Progressing

## 2024-06-22 NOTE — Progress Notes (Signed)
 Patient endorses anxiety and depression denies SI/HI/A/VH at present prn Vistaril  25 mg given at HS and reported effective. Compliant with all medications and treatment plan. Support ongoing.

## 2024-06-22 NOTE — Group Note (Signed)
 Date:  06/22/2024 Time:  11:10 AM  Group Topic/Focus:  Emotional Education:   The focus of this group is to discuss what feelings/emotions are, and how they are experienced.    Participation Level:  Active  Participation Quality:  Appropriate  Affect:  Appropriate  Cognitive:  Appropriate  Insight: Appropriate  Engagement in Group:  Engaged  Modes of Intervention:  Discussion, Education, and Socialization  Additional Comments:    Deitra Caron [place] 06/22/2024, 11:10 AM

## 2024-06-22 NOTE — Group Note (Signed)
 Date:  06/22/2024 Time:  9:37 PM  Group Topic/Focus:  Wrap-Up Group:   The focus of this group is to help patients review their daily goal of treatment and discuss progress on daily workbooks.    Participation Level:  Active  Participation Quality:  Appropriate and Attentive  Affect:  Appropriate  Cognitive:  Alert  Insight: Appropriate  Engagement in Group:  Engaged  Modes of Intervention:  Discussion and Support  Additional Comments:     Maglione,Chaniya Genter E 06/22/2024, 9:37 PM

## 2024-06-23 DIAGNOSIS — F339 Major depressive disorder, recurrent, unspecified: Secondary | ICD-10-CM

## 2024-06-23 MED ORDER — HYDROXYZINE HCL 50 MG PO TABS
50.0000 mg | ORAL_TABLET | Freq: Three times a day (TID) | ORAL | Status: DC | PRN
  Administered 2024-06-23 – 2024-06-25 (×4): 50 mg via ORAL
  Filled 2024-06-23 (×4): qty 1

## 2024-06-23 NOTE — Progress Notes (Signed)
   06/23/24 0900  Psych Admission Type (Psych Patients Only)  Admission Status Voluntary  Psychosocial Assessment  Patient Complaints Anxiety  Eye Contact Fair  Facial Expression Animated  Affect Appropriate to circumstance  Speech Logical/coherent  Interaction Assertive  Motor Activity Slow  Appearance/Hygiene Unremarkable  Behavior Characteristics Calm;Cooperative  Mood Anxious  Thought Process  Coherency WDL  Content WDL  Delusions None reported or observed  Perception WDL  Hallucination None reported or observed  Judgment Impaired  Confusion None  Danger to Self  Current suicidal ideation? Denies  Danger to Others  Danger to Others None reported or observed

## 2024-06-23 NOTE — Group Note (Signed)
 [facility] LCSW Group Therapy Note    Group Date: 06/23/2024 Start Time: 1300 End Time: 1340  Type of Therapy and Topic:  Group Therapy:  Overcoming Obstacles  Participation Level:  [facility] PARTICIPATION LEVEL: Did Not Attend   Description of Group:   In this group patients will be encouraged to explore what they see as obstacles to their own wellness and recovery. They will be guided to discuss their thoughts, feelings, and behaviors related to these obstacles. The group will process together ways to cope with barriers, with attention given to specific choices patients can make. Each patient will be challenged to identify changes they are motivated to make in order to overcome their obstacles. This group will be process-oriented, with patients participating in exploration of their own experiences as well as giving and receiving support and challenge from other group members.  Therapeutic Goals: 1. Patient will identify personal and current obstacles as they relate to admission. 2. Patient will identify barriers that currently interfere with their wellness or overcoming obstacles.  3. Patient will identify feelings, thought process and behaviors related to these barriers. 4. Patient will identify two changes they are willing to make to overcome these obstacles:    Summary of Patient Progress X   Therapeutic Modalities:   Cognitive Behavioral Therapy Solution Focused Therapy Motivational Interviewing Relapse Prevention Therapy   Nadara JONELLE Fam, LCSW

## 2024-06-23 NOTE — Progress Notes (Signed)
 [facility] MD Progress Note  06/23/2024 3:55 PM Paul Hebert  MRN:  [id number]   Subjective:  Chart reviewed, case discussed in multidisciplinary meeting, patient seen during rounds.   Today states his anxiety is up secondary to his pain.  Endorses cancer diagnosis.  Notes he is also anxious about housing states he was at [employer] and is uncertain if he can return.  Verified with social work that he can return.  He notes primary stressors are health, finance, and social.  He reports stable sleep and appetite.  He rates depression at 7 out of 10.  He rates anxiety at 9 out of 10.  He denies SI, HI, and AVH.  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  He also has follow-up with cancer doctor to determine if he can have port removed.  After discussing we will increase hydroxyzine  for anxiety.  When discussing dry mouth offered patient lozenge and he declined.  Will continue with Magic mouthwash. Librium  taper complete denies ongoing withdrawal symptoms.  Sleep: Fair  Appetite:  Fair  Past Psychiatric History: see h&P Family History:  Family History  Problem Relation Age of Onset   Cancer Father    Social History:  Social History   Substance and Sexual Activity  Alcohol Use Not Currently     Social History   Substance and Sexual Activity  Drug Use Yes   Types: Marijuana, Crack cocaine , Methamphetamines   Comment: marijuana every day except the joint    Social History   Socioeconomic History   Marital status: Single    Spouse name: Not on file   Number of children: Not on file   Years of education: Not on file   Highest education level: Not on file  Occupational History   Not on file  Tobacco Use   Smoking status: Every Day    Current packs/day: 0.50    Average packs/day: 0.5 packs/day for 35.0 years (17.5 ttl pk-yrs)    Types: Cigarettes   Smokeless tobacco: Former   Tobacco  comments:    Patches very rarely  Vaping Use   Vaping status: Former  Substance and Sexual Activity   Alcohol use: Not Currently   Drug use: Yes    Types: Marijuana, Crack cocaine , Methamphetamines    Comment: marijuana every day except the joint   Sexual activity: Not Currently  Other Topics Concern   Not on file  Social History Narrative   Not on file   Social Drivers of Health   Financial Resource Strain: Medium Risk (06/13/2022)   Received from [facility]   Overall Financial Resource Strain (CARDIA)    Difficulty of Paying Living Expenses: Somewhat hard  Food Insecurity: Food Insecurity Present (06/18/2024)   Hunger Vital Sign    Worried About Running Out of Food in the Last Year: Sometimes true    Ran Out of Food in the Last Year: Never true  Transportation Needs: Unmet Transportation Needs (06/18/2024)   PRAPARE - Administrator, Civil Service (Medical): Yes    Lack of Transportation (Non-Medical): Yes  Physical Activity: Not on file  Stress: Not on file  Social Connections: Not on file   Past Medical History:  Past Medical History:  Diagnosis Date   Cancer associated pain    GERD (gastroesophageal reflux disease)    Tonsil cancer (HCC)     Past Surgical History:  Procedure Laterality Date   LARYNGOSCOPY Left 01/24/2022   Procedure: DIRECT LARYNGOSCOPY WITH TONSIL BIOPSY;  Surgeon: Herminio Miu, MD;  Location: [facility] ORS;  Service: ENT;  Laterality: Left;   PORT-A-CATH REMOVAL N/A 02/04/2021   Procedure: REMOVAL PORT-A-CATH;  Surgeon: Desiderio Schanz, MD;  Location: [facility] ORS;  Service: General;  Laterality: N/A;   PORTA CATH INSERTION N/A 10/25/2020   Procedure: PORTA CATH INSERTION;  Surgeon: Marea Selinda RAMAN, MD;  Location: [facility] INVASIVE CV LAB;  Service: Cardiovascular;  Laterality: N/A;   PORTA CATH INSERTION N/A 02/09/2022   Procedure: PORTA CATH INSERTION;  Surgeon: Marea Selinda RAMAN, MD;  Location: [facility] INV[facility] CV [facility];  Service: Cardiovascular;   Laterality: N/A;   REMOVAL OF GASTROSTOMY TUBE N/A 02/04/2021   Procedure: REMOVAL OF GASTROSTOMY TUBE;  Surgeon: Desiderio Schanz, MD;  Location: [facility] ORS;  Service: General;  Laterality: N/A;   TONGUE BIOPSY Left 01/24/2022   Procedure: TONGUE BIOPSY;  Surgeon: Herminio Miu, MD;  Location: [facility] ORS;  Service: ENT;  Laterality: Left;    Current Medications: Current Facility-Administered Medications  Medication Dose Route Frequency Provider Last Rate Last Admin   acetaminophen  (TYLENOL ) tablet 650 mg  650 mg Oral Q6H PRN McLauchlin, Angela, NP   650 mg at 06/21/24 1747   DULoxetine  (CYMBALTA ) DR capsule 60 mg  60 mg Oral Daily Shrivastava, Aryendra, MD   60 mg at 06/23/24 9187   gabapentin  (NEURONTIN ) capsule 300 mg  300 mg Oral TID Cleotilde Hoy HERO, NP   300 mg at 06/23/24 1222   hydrOXYzine  (ATARAX ) tablet 50 mg  50 mg Oral TID PRN Nyzir Dubois E, PA-C   50 mg at 06/23/24 1540   magic mouthwash  5 mL Oral TID Shrivastava, Aryendra, MD   5 mL at 06/23/24 1224   multivitamin with minerals tablet 1 tablet  1 tablet Oral Daily Jadapalle, Sree, MD   1 tablet at 06/23/24 9185   nicotine  (NICODERM CQ  - dosed in mg/24 hours) patch 14 mg  14 mg Transdermal Daily Cleotilde Hoy HERO, NP   14 mg at 06/23/24 0813   OLANZapine  (ZYPREXA ) injection 10 mg  10 mg Intramuscular TID PRN Jadapalle, Sree, MD       OLANZapine  zydis (ZYPREXA ) disintegrating tablet 5 mg  5 mg Oral TID PRN Jadapalle, Sree, MD       traZODone  (DESYREL ) tablet 50 mg  50 mg Oral QHS McLauchlin, Angela, NP   50 mg at 06/22/24 2122   Facility-Administered Medications Ordered in Other Encounters  Medication Dose Route Frequency Provider Last Rate Last Admin   heparin  lock flush 100 UNIT/ML injection            heparin  lock flush 100 UNIT/ML injection            sodium chloride  flush (NS) 0.9 % injection 10 mL  10 mL Intravenous PRN Rao, Archana C, MD   10 mL at 04/05/22 1112    Lab Results: No results found for this or any  previous visit (from the past 48 hours).  Blood Alcohol level:  Lab Results  Component Value Date   [facility] <15 06/17/2024   ETH <10 10/05/2021    Metabolic Disorder Labs: Lab Results  Component Value Date   HGBA1C 5.6 06/19/2024   MPG 114 06/19/2024   MPG 108.28 10/05/2021   No results found for: PROLACTIN Lab Results  Component Value Date   CHOL 168 06/19/2024   TRIG 62 06/19/2024   HDL 58 06/19/2024   CHOLHDL  2.9 06/19/2024   VLDL 12 06/19/2024   LDLCALC 98 06/19/2024   LDLCALC 133 (H) 10/05/2021    Physical Findings: AIMS:  , ,  ,  ,    CIWA:  CIWA-Ar Total: 2 COWS:      Psychiatric Specialty Exam:  Presentation  General Appearance: Casual  Eye Contact:Fair  Speech:Clear and Coherent  Speech Volume:Normal    Mood and Affect  Mood:Anxious  Affect:Congruent   Thought Process  Thought Processes:Coherent  Descriptions of Associations:Intact  Orientation:Full (Time, Place and Person)  Thought Content:WDL  Hallucinations:No data recorded Ideas of Reference:No data recorded Suicidal Thoughts:Suicidal Thoughts: No  Homicidal Thoughts:Homicidal Thoughts: No   Sensorium  Memory:Immediate Fair; Recent Fair  Judgment:No data recorded Insight:No data recorded  Executive Functions  Concentration:No data recorded Attention Span:No data recorded Recall:No data recorded Fund of Knowledge:No data recorded Language:No data recorded  Psychomotor Activity  Psychomotor Activity:No data recorded Musculoskeletal: Strength & Muscle Tone: within normal limits Gait & Station: normal Assets  Assets:Communication Skills; Desire for Improvement    Physical Exam: Physical Exam Vitals and nursing note reviewed.  HENT:     Head: Atraumatic.  Eyes:     Extraocular Movements: Extraocular movements intact.  Pulmonary:     Effort: Pulmonary effort is normal.  Neurological:     Mental Status: He is alert and oriented to person, place, and time.     Review of Systems  Psychiatric/Behavioral:  Positive for depression. Negative for hallucinations and suicidal ideas. The patient is nervous/anxious.    Blood pressure 125/88, pulse 88, temperature (!) 97.3 F (36.3 C), resp. rate 18, height 5' 8 (1.727 m), weight 64 kg, SpO2 99%. Body mass index is 21.44 kg/m.  Diagnosis: Principal Problem:   MDD (major depressive disorder), recurrent severe, without psychosis (HCC)   PLAN: Safety and Monitoring:  -- Voluntary admission to inpatient psychiatric unit for safety, stabilization and treatment  -- Daily contact with patient to assess and evaluate symptoms and progress in treatment  -- Patient's case to be discussed in multi-disciplinary team meeting  -- Observation Level : q15 minute checks  -- Vital signs:  q12 hours  -- Precautions: suicide, elopement, and assault -- Encouraged patient to participate in unit milieu and in scheduled group therapies  2. Psychiatric Diagnoses and Treatment:   Continue Cymbalta  to 60 mg daily for pain, anxiety, mood Continue gabapentin  300 mg PO TID for anxiety, mood stabilization, and pain. Increase as needed hydroxyzine  to 50 mg 3 times daily as needed Librium  taper complete Continue to monitor mood, anxiety, and SI closely. Engage in supportive psychotherapy and structured milieu therapy. Coordinate with parole officer and social work for discharge planning and housing stability.  Encourage abstinence from alcohol and cocaine ; discuss referral to substance treatment resources.   -- The risks/benefits/side-effects/alternatives to this medication were discussed in detail with the patient and time was given for questions. The patient consents to medication trial.                -- Metabolic profile and EKG monitoring obtained while on an atypical antipsychotic (BMI: Lipid Panel: HbgA1c: QTc:)              -- Encouraged patient to participate in unit milieu and in scheduled group therapies                    3. Medical Issues Being Addressed:   Continue Magic mouthwash  4. Discharge Planning:   -- Social work and case management  to assist with discharge planning and identification of hospital follow-up needs prior to discharge  -- Estimated LOS: 3-4 days  Donnice FORBES Right, PA-C 06/23/2024, 3:55 PM

## 2024-06-23 NOTE — Group Note (Signed)
 Date:  06/23/2024 Time:  6:16 PM  Group Topic/Focus:  Building Self Esteem:   The Focus of this group is helping patients become aware of the effects of self-esteem on their lives, the things they and others do that enhance or undermine their self-esteem, seeing the relationship between their level of self-esteem and the choices they make and learning ways to enhance self-esteem.    Participation Level:  Did Not Attend   Camellia HERO Laiyah Exline 06/23/2024, 6:16 PM

## 2024-06-23 NOTE — Group Note (Signed)
 Recreation Therapy Group Note   Group Topic:Stress Management  Group Date: 06/23/2024 Start Time: 1530 End Time: 1630 Facilitators: Celestia Jeoffrey BRAVO, LRT, CTRS Location: Courtyard  Group Description: Meditation. LRT and patients discussed what they know about meditation and mindfulness. LRT played a Deep Breathing Meditation exercise script for patients to follow along to. LRT and patients discussed how meditation and deep breathing can be used as a coping skill post--discharge to help manage symptoms of stress.   Goal Area(s) Addressed: Patient will practice using relaxation technique. Patient will identify a new coping skill.  Patient will follow multistep directions to reduce anxiety and stress.   Affect/Mood: Appropriate   Participation Level: Minimal    Clinical Observations/Individualized Feedback: Christropher came late to group. Pt was present for less than half the session.   Plan: Continue to engage patient in RT group sessions 2-3x/week.   Jeoffrey BRAVO Celestia, LRT, CTRS 06/23/2024 5:17 PM

## 2024-06-23 NOTE — Group Note (Signed)
 Recreation Therapy Group Note   Group Topic:General Recreation  Group Date: 06/23/2024 Start Time: 1020 End Time: 1130 Facilitators: Celestia Jeoffrey BRAVO, LRT, CTRS Location: Courtyard  Group Description: [employer]. LRT and patients played games of basketball, drew with chalk, and played corn hole while outside in the courtyard while getting fresh air and sunlight. Music was being played in the background. LRT and peers conversed about different games they have played before, what they do in their free time and anything else that is on their minds. LRT encouraged pts to drink water  after being outside, sweating and getting their heart rate up.  Goal Area(s) Addressed: Patient will build on frustration tolerance skills. Patients will partake in a competitive play game with peers. Patients will gain knowledge of new leisure interest/hobby.    Affect/Mood: Appropriate   Participation Level: Minimal    Clinical Observations/Individualized Feedback: Paul Hebert came late to group. Pt was present for less than 10 minutes.   Plan: Continue to engage patient in RT group sessions 2-3x/week.   Jeoffrey BRAVO Celestia, LRT, CTRS 06/23/2024 12:49 PM

## 2024-06-23 NOTE — Group Note (Signed)
 Date:  06/23/2024 Time:  9:10 PM  Group Topic/Focus:  Crisis Planning:   The purpose of this group is to help patients create a crisis plan for use upon discharge or in the future, as needed. Managing Feelings:   The focus of this group is to identify what feelings patients have difficulty handling and develop a plan to handle them in a healthier way upon discharge.    Participation Level:  Active  Participation Quality:  Appropriate  Affect:  Appropriate  Cognitive:  Appropriate  Insight: Appropriate  Engagement in Group:  Distracting, Engaged, and Supportive  Modes of Intervention:  Discussion, Education, and Support  Additional Comments:    Mc Hollen L 06/23/2024, 9:10 PM

## 2024-06-23 NOTE — Plan of Care (Signed)

## 2024-06-23 NOTE — Progress Notes (Signed)
   06/23/24 1953  Psych Admission Type (Psych Patients Only)  Admission Status Voluntary  Psychosocial Assessment  Patient Complaints Anxiety  Eye Contact Fair  Facial Expression Animated  Affect Appropriate to circumstance  Speech Logical/coherent  Interaction Assertive  Motor Activity Slow  Appearance/Hygiene Unremarkable  Behavior Characteristics Cooperative  Mood Pleasant;Anxious  Thought Process  Coherency WDL  Content WDL  Delusions None reported or observed  Perception WDL  Hallucination None reported or observed  Judgment Impaired  Confusion None  Danger to Self  Current suicidal ideation? Denies  Danger to Others  Danger to Others None reported or observed

## 2024-06-24 DIAGNOSIS — F339 Major depressive disorder, recurrent, unspecified: Secondary | ICD-10-CM | POA: Diagnosis not present

## 2024-06-24 NOTE — Progress Notes (Signed)
   06/24/24 0900  Psych Admission Type (Psych Patients Only)  Admission Status Voluntary  Psychosocial Assessment  Patient Complaints Anxiety  Eye Contact Fair  Facial Expression Animated  Affect Appropriate to circumstance  Speech Logical/coherent  Interaction Assertive  Motor Activity Slow  Appearance/Hygiene Unremarkable  Behavior Characteristics Cooperative  Mood Anxious;Pleasant  Thought Process  Coherency WDL  Content WDL  Delusions None reported or observed  Perception WDL  Hallucination None reported or observed  Judgment Impaired  Confusion None  Danger to Self  Current suicidal ideation? Denies  Danger to Others  Danger to Others None reported or observed   Patient states his goal is  mood swings and depression need to be stabilized before leaving here. Patient had Vistaril  with good result.

## 2024-06-24 NOTE — Group Note (Signed)
 LCSW Group Therapy Note  Group Date: 06/24/2024 Start Time: 1300 End Time: 1400   Type of Therapy and Topic:  Group Therapy: Anger Cues and Responses  Participation Level:  Did Not Attend   Description of Group:   In this group, patients learned how to recognize the physical, cognitive, emotional, and behavioral responses they have to anger-provoking situations.  They identified a recent time they became angry and how they reacted.  They analyzed how their reaction was possibly beneficial and how it was possibly unhelpful.  The group discussed a variety of healthier coping skills that could help with such a situation in the future.  Focus was placed on how helpful it is to recognize the underlying emotions to our anger, because working on those can lead to a more permanent solution as well as our ability to focus on the important rather than the urgent.  Therapeutic Goals: Patients will remember their last incident of anger and how they felt emotionally and physically, what their thoughts were at the time, and how they behaved. Patients will identify how their behavior at that time worked for them, as well as how it worked against them. Patients will explore possible new behaviors to use in future anger situations. Patients will learn that anger itself is normal and cannot be eliminated, and that healthier reactions can assist with resolving conflict rather than worsening situations.  Summary of Patient Progress:   X  Therapeutic Modalities:   Cognitive Behavioral Therapy    Sherryle JINNY Margo, LCSW 06/24/2024  2:59 PM

## 2024-06-24 NOTE — Plan of Care (Signed)
  Problem: Education: Goal: Emotional status will improve Outcome: Progressing Goal: Mental status will improve Outcome: Progressing Goal: Verbalization of understanding the information provided will improve Outcome: Progressing   Problem: Activity: Goal: Interest or engagement in activities will improve Outcome: Progressing   Problem: Coping: Goal: Ability to demonstrate self-control will improve Outcome: Progressing   Problem: Health Behavior/Discharge Planning: Goal: Identification of resources available to assist in meeting health care needs will improve Outcome: Progressing

## 2024-06-24 NOTE — BHH Counselor (Addendum)
 CSW touched base with [facility] and spoke with group home, Director who reported that he needed to speak to patient directly. Director reported that the patient went missing and then called to say he was in the hospital. Director reported that before he comes back, they need to have a phone conversation.   CSW explained this to patient and he attempted to call. [facility] did not answer.   Patient to try again in a few.   CSW to continue to assess.   Addendum 9:39 AM Update:  CSW touched base with Educational psychologist, Kayla following the patient's phone assessment. Patient has been accepted back to the [facility]. Medication restrictions communicated to PA. Discharge details communicated with director.    Semira Stoltzfus, MSW, LCSWA 06/24/2024 9:15 AM

## 2024-06-24 NOTE — Plan of Care (Signed)
   Problem: Education: Goal: Emotional status will improve Outcome: Progressing Goal: Mental status will improve Outcome: Progressing

## 2024-06-24 NOTE — Progress Notes (Signed)
 Pt calm and pleasant during assessment denying SI/HI/AVH. Pt stated he was scheduled to D/C tomorrow. Pt observed by this Clinical research associate interacting appropriately with staff and peers on the unit. Pt compliant with medication administration per MD orders. Pt given education, support, and encouragement to be active in his treatment plan. Pt being monitored Q 15 minutes for safety per unit protocol remains safe on the unit

## 2024-06-24 NOTE — Group Note (Signed)
 Date:  06/24/2024 Time:  8:43 PM  Group Topic/Focus:  Wrap-Up/Gratitude Group:   The focus of this group is to help patients review their daily goal of treatment and discuss progress on daily workbooks.Also discussing what the pt are grateful for in their lives and the true meaning of gratefulness.    Participation Level:  Active  Participation Quality:  Appropriate  Affect:  Appropriate  Cognitive:  Appropriate  Insight: Appropriate  Engagement in Group:  Engaged  Modes of Intervention:  Discussion  Additional Comments:    Paul Hebert 06/24/2024, 8:43 PM

## 2024-06-24 NOTE — Progress Notes (Signed)
 [facility] MD Progress Note  06/24/2024 10:03 AM Paul Hebert  MRN:  [id number]   Subjective:  Chart reviewed, case discussed in multidisciplinary meeting, patient seen during rounds.   9/9 patient seen today for follow-up alert and oriented.  They are pleasant and cooperative on exam.  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  Anxiety is stable.  Appetite and mood are stable.  Sleep reports frequent nighttime awakenings no difficulty getting to sleep.  Has never had prior sleep study.  He denies SI HI and AVH.  Medication list is reviewed with patient as the mission will only except him on a few medications we will send tomorrow over and allow them to review.  He continues to be future oriented he request a 30-day supply of his medications on discharge.  He is optimistic that he will get his disability soon and be able to leave the shelter.  He voices no concerns or complaints at this time.  9/8 Today states his anxiety is up secondary to his pain.  Endorses cancer diagnosis.  Notes he is also anxious about housing states he was at [employer] and is uncertain if he can return.  Verified with social work that he can return.  He notes primary stressors are health, finance, and social.  He reports stable sleep and appetite.  He rates depression at 7 out of 10.  He rates anxiety at 9 out of 10.  He denies SI, HI, and AVH.  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  He also has follow-up with cancer doctor to determine if he can have port removed.  After discussing we will increase hydroxyzine  for anxiety.  When discussing dry mouth offered patient lozenge and he declined.  Will continue with Magic mouthwash. Librium  taper complete denies ongoing withdrawal symptoms.  Sleep: Fair  Appetite:  Fair  Past Psychiatric History: see h&P Family History:  Family History  Problem Relation Age of Onset    Cancer Father    Social History:  Social History   Substance and Sexual Activity  Alcohol Use Not Currently     Social History   Substance and Sexual Activity  Drug Use Yes   Types: Marijuana, Crack cocaine , Methamphetamines   Comment: marijuana every day except the joint    Social History   Socioeconomic History   Marital status: Single    Spouse name: Not on file   Number of children: Not on file   Years of education: Not on file   Highest education level: Not on file  Occupational History   Not on file  Tobacco Use   Smoking status: Every Day    Current packs/day: 0.50    Average packs/day: 0.5 packs/day for 35.0 years (17.5 ttl pk-yrs)    Types: Cigarettes   Smokeless tobacco: Former   Tobacco comments:    Patches very rarely  Vaping Use   Vaping status: Former  Substance and Sexual Activity   Alcohol use: Not Currently   Drug use: Yes    Types: Marijuana, Crack cocaine , Methamphetamines    Comment: marijuana every day except the joint   Sexual activity: Not Currently  Other Topics Concern   Not on file  Social History Narrative   Not on file   Social Drivers of Health   Financial Resource Strain: Medium Risk (06/13/2022)   Received from [facility]   Overall  Financial Resource Strain (CARDIA)    Difficulty of Paying Living Expenses: Somewhat hard  Food Insecurity: Food Insecurity Present (06/18/2024)   Hunger Vital Sign    Worried About Running Out of Food in the Last Year: Sometimes true    Ran Out of Food in the Last Year: Never true  Transportation Needs: Unmet Transportation Needs (06/18/2024)   PRAPARE - Administrator, Civil Service (Medical): Yes    Lack of Transportation (Non-Medical): Yes  Physical Activity: Not on file  Stress: Not on file  Social Connections: Not on file   Past Medical History:  Past Medical History:  Diagnosis Date   Cancer associated pain    GERD (gastroesophageal reflux disease)    Tonsil cancer  (HCC)     Past Surgical History:  Procedure Laterality Date   LARYNGOSCOPY Left 01/24/2022   Procedure: DIRECT LARYNGOSCOPY WITH TONSIL BIOPSY;  Surgeon: Herminio Miu, MD;  Location: [facility];  Service: ENT;  Laterality: Left;   PORT-A-CATH REMOVAL N/A 02/04/2021   Procedure: REMOVAL PORT-A-CATH;  Surgeon: Desiderio Schanz, MD;  Location: [facility];  Service: General;  Laterality: N/A;   PORTA CATH INSERTION N/A 10/25/2020   Procedure: PORTA CATH INSERTION;  Surgeon: Marea Selinda RAMAN, MD;  Location: [facility];  Service: Cardiovascular;  Laterality: N/A;   PORTA CATH INSERTION N/A 02/09/2022   Procedure: PORTA CATH INSERTION;  Surgeon: Marea Selinda RAMAN, MD;  Location: [facility];  Service: Cardiovascular;  Laterality: N/A;   REMOVAL OF GASTROSTOMY TUBE N/A 02/04/2021   Procedure: REMOVAL OF GASTROSTOMY TUBE;  Surgeon: Desiderio Schanz, MD;  Location: [facility];  Service: General;  Laterality: N/A;   TONGUE BIOPSY Left 01/24/2022   Procedure: TONGUE BIOPSY;  Surgeon: Herminio Miu, MD;  Location: [facility];  Service: ENT;  Laterality: Left;    Current Medications: Current Facility-Administered Medications  Medication Dose Route Frequency Provider Last Rate Last Admin   acetaminophen  (TYLENOL ) tablet 650 mg  650 mg Oral Q6H PRN McLauchlin, Angela, NP   650 mg at 06/21/24 1747   DULoxetine  (CYMBALTA ) DR capsule 60 mg  60 mg Oral Daily Shrivastava, Aryendra, MD   60 mg at 06/24/24 9176   gabapentin  (NEURONTIN ) capsule 300 mg  300 mg Oral TID Cleotilde Hoy HERO, NP   300 mg at 06/24/24 9176   hydrOXYzine  (ATARAX ) tablet 50 mg  50 mg Oral TID PRN Brittaney Beaulieu E, PA-C   50 mg at 06/24/24 9176   magic mouthwash  5 mL Oral TID Shrivastava, Aryendra, MD   5 mL at 06/24/24 9176   multivitamin with minerals tablet 1 tablet  1 tablet Oral Daily Jadapalle, Sree, MD   1 tablet at 06/24/24 9177   nicotine  (NICODERM CQ  - dosed in mg/24 hours) patch 14 mg  14 mg Transdermal Daily Cleotilde Hoy HERO, NP   14 mg at 06/24/24 9176   OLANZapine  (ZYPREXA ) injection 10 mg  10 mg Intramuscular TID PRN Jadapalle, Sree, MD       OLANZapine  zydis (ZYPREXA ) disintegrating tablet 5 mg  5 mg Oral TID PRN Jadapalle, Sree, MD       traZODone  (DESYREL ) tablet 50 mg  50 mg Oral QHS McLauchlin, Angela, NP   50 mg at 06/23/24 2114   Facility-Administered Medications Ordered in Other Encounters  Medication Dose Route Frequency Provider Last Rate Last Admin   heparin  lock flush 100 UNIT/ML injection            heparin  lock flush  100 UNIT/ML injection            sodium chloride  flush (NS) 0.9 % injection 10 mL  10 mL Intravenous PRN Rao, Archana C, MD   10 mL at 04/05/22 1112    Lab Results: No results found for this or any previous visit (from the past 48 hours).  Blood Alcohol level:  Lab Results  Component Value Date   [facility] <15 06/17/2024   ETH <10 10/05/2021    Metabolic Disorder Labs: Lab Results  Component Value Date   HGBA1C 5.6 06/19/2024   MPG 114 06/19/2024   MPG 108.28 10/05/2021   No results found for: PROLACTIN Lab Results  Component Value Date   CHOL 168 06/19/2024   TRIG 62 06/19/2024   HDL 58 06/19/2024   CHOLHDL 2.9 06/19/2024   VLDL 12 06/19/2024   LDLCALC 98 06/19/2024   LDLCALC 133 (H) 10/05/2021    Physical Findings: AIMS:  , ,  ,  ,    CIWA:  CIWA-Ar Total: 2 COWS:      Psychiatric Specialty Exam:  Presentation  General Appearance: Casual  Eye Contact:Fair  Speech:Clear and Coherent  Speech Volume:Normal    Mood and Affect  Mood:Anxious  Affect:Congruent   Thought Process  Thought Processes:Coherent  Descriptions of Associations:Intact  Orientation:Full (Time, Place and Person)  Thought Content:WDL  Hallucinations:No data recorded Ideas of Reference:No data recorded Suicidal Thoughts:Suicidal Thoughts: No  Homicidal Thoughts:Homicidal Thoughts: No   Sensorium  Memory:Immediate Fair; Recent Fair  Judgment:No data  recorded Insight:No data recorded  Executive Functions  Concentration:No data recorded Attention Span:No data recorded Recall:No data recorded Fund of Knowledge:No data recorded Language:No data recorded  Psychomotor Activity  Psychomotor Activity:No data recorded Musculoskeletal: Strength & Muscle Tone: within normal limits Gait & Station: normal Assets  Assets:Communication Skills; Desire for Improvement    Physical Exam: Physical Exam Vitals and nursing note reviewed.  HENT:     Head: Atraumatic.  Eyes:     Extraocular Movements: Extraocular movements intact.  Pulmonary:     Effort: Pulmonary effort is normal.  Neurological:     Mental Status: He is alert and oriented to person, place, and time.    Review of Systems  Psychiatric/Behavioral:  Positive for depression. Negative for hallucinations and suicidal ideas. The patient is nervous/anxious.    Blood pressure 124/75, pulse 81, temperature (!) 97.3 F (36.3 C), resp. rate (!) 1, height 5' 8 (1.727 m), weight 64 kg, SpO2 98%. Body mass index is 21.44 kg/m.  Diagnosis: Principal Problem:   MDD (major depressive disorder), recurrent severe, without psychosis (HCC)   PLAN: Safety and Monitoring:  -- Voluntary admission to inpatient psychiatric unit for safety, stabilization and treatment  -- Daily contact with patient to assess and evaluate symptoms and progress in treatment  -- Patient's case to be discussed in multi-disciplinary team meeting  -- Observation Level : q15 minute checks  -- Vital signs:  q12 hours  -- Precautions: suicide, elopement, and assault -- Encouraged patient to participate in unit milieu and in scheduled group therapies  2. Psychiatric Diagnoses and Treatment:   Continue Cymbalta  to 60 mg daily for pain, anxiety, mood Continue gabapentin  300 mg PO TID for anxiety, mood stabilization, and pain. Continue hydroxyzine  to 50 mg 3 times daily as needed Librium  taper complete- no withdrawal  symptoms Vitals stable  Continue to monitor mood Will be appropriate for discharge in coming days.  Engage in supportive psychotherapy and structured milieu therapy. Coordinate with parole officer  and social work for discharge planning and housing stability.  Encourage abstinence from alcohol and cocaine ; discuss referral to substance treatment resources.   -- The risks/benefits/side-effects/alternatives to this medication were discussed in detail with the patient and time was given for questions. The patient consents to medication trial.                -- Metabolic profile and EKG monitoring obtained while on an atypical antipsychotic (BMI: Lipid Panel: HbgA1c: QTc:)              -- Encouraged patient to participate in unit milieu and in scheduled group therapies                   3. Medical Issues Being Addressed:   Continue Magic mouthwash  4. Discharge Planning:   -- Social work and case management to assist with discharge planning and identification of hospital follow-up needs prior to discharge  -- Estimated LOS: 3-4 days  Donnice FORBES Right, PA-C 06/24/2024, 10:03 AM

## 2024-06-24 NOTE — Group Note (Signed)
 Recreation Therapy Group Note   Group Topic:Coping Skills  Group Date: 06/24/2024 Start Time: 1530 End Time: 1630 Facilitators: Celestia Jeoffrey BRAVO, LRT, CTRS Location: Courtyard  Group Description: Yoga. LRT and patients discussed the benefits of yoga and how it differs from strength exercises. LRT educated patients on the mental and physical benefits of yoga and deep breathing and how it can be used as a Associate Professor. LRT instructed patients on different stretching and yoga poses to complete that focused on all parts of the body, as well as deep breathing. Pt encouraged to stop movement at any time if they feel discomfort or pain.   Goal Area(s) Addressed: Patient will practice using relaxation technique. Patient will identify a new coping skill.  Patient will follow multistep directions to reduce anxiety and stress.  Affect/Mood: Appropriate   Participation Level: Minimal    Clinical Observations/Individualized Feedback: Paul Hebert was present in the courtyard. Pt chose not to complete yoga exercises.   Plan: Continue to engage patient in RT group sessions 2-3x/week.   Jeoffrey BRAVO Celestia, LRT, CTRS 06/24/2024 4:50 PM

## 2024-06-24 NOTE — Group Note (Signed)
 Recreation Therapy Group Note   Group Topic:Health and Wellness  Group Date: 06/24/2024 Start Time: 1000 End Time: 1100 Facilitators: Celestia Jeoffrey BRAVO, LRT, CTRS Location: Courtyard  Group Description: [employer]. LRT and patients played games of basketball, drew with chalk, and played corn hole while outside in the courtyard while getting fresh air and sunlight. Music was being played in the background. LRT and peers conversed about different games they have played before, what they do in their free time and anything else that is on their minds. LRT encouraged pts to drink water  after being outside, sweating and getting their heart rate up.  Goal Area(s) Addressed: Patient will build on frustration tolerance skills. Patients will partake in a competitive play game with peers. Patients will gain knowledge of new leisure interest/hobby.    Affect/Mood: Appropriate   Participation Level: Active   Participation Quality: Independent   Behavior: Appropriate   Speech/Thought Process: Coherent   Insight: Fair   Judgement: Fair    Modes of Intervention: Activity   Patient Response to Interventions:  Receptive   Education Outcome:  Acknowledges education   Clinical Observations/Individualized Feedback: Paul Hebert was active in their participation of session activities and group discussion. Pt interacted well with LRT and peers duration of session.    Plan: Continue to engage patient in RT group sessions 2-3x/week.   Jeoffrey BRAVO Celestia, LRT, CTRS 06/24/2024 11:12 AM

## 2024-06-25 ENCOUNTER — Other Ambulatory Visit: Payer: Self-pay

## 2024-06-25 MED ORDER — GABAPENTIN 400 MG PO CAPS
400.0000 mg | ORAL_CAPSULE | Freq: Two times a day (BID) | ORAL | 0 refills | Status: DC
  Filled 2024-06-25: qty 60, 30d supply, fill #0

## 2024-06-25 MED ORDER — HYDROXYZINE HCL 50 MG PO TABS
50.0000 mg | ORAL_TABLET | Freq: Every day | ORAL | 0 refills | Status: DC | PRN
  Filled 2024-06-25: qty 30, 30d supply, fill #0

## 2024-06-25 MED ORDER — GABAPENTIN 300 MG PO CAPS
300.0000 mg | ORAL_CAPSULE | Freq: Three times a day (TID) | ORAL | 0 refills | Status: DC
  Filled 2024-06-25: qty 90, 30d supply, fill #0

## 2024-06-25 MED ORDER — TRAZODONE HCL 50 MG PO TABS
50.0000 mg | ORAL_TABLET | Freq: Every day | ORAL | 0 refills | Status: DC
  Filled 2024-06-25: qty 30, 30d supply, fill #0

## 2024-06-25 MED ORDER — GABAPENTIN 400 MG PO CAPS: 400.0000 mg | ORAL_CAPSULE | Freq: Two times a day (BID) | ORAL | Status: DC

## 2024-06-25 MED ORDER — DULOXETINE HCL 60 MG PO CPEP
60.0000 mg | ORAL_CAPSULE | Freq: Every day | ORAL | 0 refills | Status: DC
  Filled 2024-06-25: qty 30, 30d supply, fill #0

## 2024-06-25 NOTE — [facility] Suicide Risk Assessment (Signed)
 [facility] Discharge Suicide Risk Assessment   Principal Problem: MDD (major depressive disorder), recurrent severe, without psychosis (HCC) Discharge Diagnoses: Principal Problem:   MDD (major depressive disorder), recurrent severe, without psychosis (HCC)   Total Time spent with patient: 1 hour  Musculoskeletal: Strength & Muscle Tone: within normal limits Gait & Station: normal Patient leans: N/A  Psychiatric Specialty Exam  Presentation  General Appearance:  Casual  Eye Contact: Fair  Speech: Clear and Coherent  Speech Volume: Normal  Handedness:No data recorded  Mood and Affect  Mood: Euthymic  Duration of Depression Symptoms: Greater than two weeks  Affect: Congruent   Thought Process  Thought Processes: Coherent  Descriptions of Associations:Intact  Orientation:Full (Time, Place and Person)  Thought Content:WDL  History of Schizophrenia/Schizoaffective disorder:No  Duration of Psychotic Symptoms:No data recorded Hallucinations:Hallucinations: None  Ideas of Reference:None  Suicidal Thoughts:Suicidal Thoughts: No  Homicidal Thoughts:Homicidal Thoughts: No   Sensorium  Memory: Immediate Fair; Recent Fair  Judgment: Good  Insight: Good   Executive Functions  Concentration: Fair  Attention Span: Fair  Recall: Fair  Fund of Knowledge: Fair  Language: Fair   Psychomotor Activity  Psychomotor Activity: Psychomotor Activity: Normal   Assets  Assets: Communication Skills; Desire for Improvement   Sleep  Sleep: Sleep: Good  Estimated Sleeping Duration (Last 24 Hours): 7.75-10.25 hours  Physical Exam: Physical Exam Vitals and nursing note reviewed.  HENT:     Head: Atraumatic.  Eyes:     Extraocular Movements: Extraocular movements intact.  Pulmonary:     Effort: Pulmonary effort is normal.  Neurological:     Mental Status: He is alert and oriented to person, place, and time.  Psychiatric:        Mood and  Affect: Mood normal.        Behavior: Behavior normal.        Thought Content: Thought content normal.        Judgment: Judgment normal.    Review of Systems  Psychiatric/Behavioral:  Negative for depression, hallucinations, memory loss, substance abuse and suicidal ideas. The patient is not nervous/anxious and does not have insomnia.    Blood pressure 118/89, pulse 78, temperature 97.7 F (36.5 C), temperature source Oral, resp. rate (!) 22, height 5' 8 (1.727 m), weight 64 kg, SpO2 98%. Body mass index is 21.44 kg/m.  Mental Status Per Nursing Assessment::   On Admission:  NA  Demographic Factors:  NA  Loss Factors: Financial problems/change in socioeconomic status  Historical Factors: Impulsivity  Risk Reduction Factors:   Positive coping skills or problem solving skills  Continued Clinical Symptoms:  Previous Psychiatric Diagnoses and Treatments Medical Diagnoses and Treatments/Surgeries  Cognitive Features That Contribute To Risk:  None    Suicide Risk:  Minimal: No identifiable suicidal ideation.  Patients presenting with no risk factors but with morbid ruminations; may be classified as minimal risk based on the severity of the depressive symptoms   Follow-up Information     [facility] Follow up.   Why: In person assessment for therapy and medication management is 07/02/24 at 9 AM. Contact information: [address] [place] [address] [phone]                 Plan Of Care/Follow-up recommendations:  # It is recommended to the patient to continue psychiatric medications as prescribed, after discharge from the hospital.   # It is recommended to the patient to follow up with your outpatient psychiatric provider and PCP. # It was  discussed with the patient, the impact of alcohol, drugs, tobacco have been there overall psychiatric and medical wellbeing, and total abstinence from substance use was recommended. # Prescriptions  provided or sent directly to preferred pharmacy at discharge. Patient agreeable to plan. Given the opportunity to ask questions. Appears to feel comfortable with discharge.  # In the event of worsening symptoms, the patient is instructed to call the crisis hotline (988), 911 and or go to the nearest ED for appropriate evaluation and treatment of symptoms. To follow-up with primary care provider for other medical issues, concerns and or health care needs # Patient was discharged to [facility] as requested with a plan to follow up as noted above.    Donnice FORBES Right, PA-C 06/25/2024, 10:55 AM

## 2024-06-25 NOTE — Progress Notes (Signed)
 Patient denies SI/I/AVH at this time. Discharge instructions, AVS, prescriptions, and transition record reviewed with patient. Patient agrees to comply with medication management, follow-up visit and outpatient therapy. Patient belongings returned to patient. Patient questions and concerns addressed and answered.  Patient ambulatory off unit. Patient discharged via Taxi to [facility].

## 2024-06-25 NOTE — Discharge Summary (Signed)
 Physician Discharge Summary Note  Patient:  Paul Hebert is an 53 y.o., male MRN:  [id number] DOB:  23-Oct-1970 Patient phone:  [phone] (home)  Patient address:   [address] [place] [address],   Total time spent: 40 min Date of Admission:  06/18/2024 Date of Discharge: 06/25/24  Reason for Admission:  The patient, a 53 year old male with a history of major depressive disorder, prior suicide attempt, polysubstance use, and throat cancer, presented voluntarily for psychiatric evaluation due to severe depressive symptoms with passive suicidal ideation. He reported feeling overwhelmed, hopeless, and stating, "Recently I've given up, like what's the point I'm 53 years old and my life is in shambles." He endorsed weeks of depressed mood, hopelessness, anhedonia, poor focus, anxiety, and social withdrawal. He reported a prior overdose attempt 2.5 years ago but denied current plan or intent. He described ongoing psychosocial stressors including medical comorbidities, housing instability, parole stress, and poor support system. Admission was indicated for safety, stabilization, and treatment of mood and anxiety symptoms in the context of moderate suicide risk.  Principal Problem: MDD (major depressive disorder), recurrent severe, without psychosis (HCC) Discharge Diagnoses: Principal Problem:   MDD (major depressive disorder), recurrent severe, without psychosis (HCC)   Past Psychiatric History: See H&P  Family Psychiatric  History: See H&P Social History:  Social History   Substance and Sexual Activity  Alcohol Use Not Currently     Social History   Substance and Sexual Activity  Drug Use Yes   Types: Marijuana, Crack cocaine , Methamphetamines   Comment: marijuana every day except the joint    Social History   Socioeconomic History   Marital status: Single    Spouse name: Not on file   Number of children: Not on file   Years of education: Not on file   Highest education  level: Not on file  Occupational History   Not on file  Tobacco Use   Smoking status: Every Day    Current packs/day: 0.50    Average packs/day: 0.5 packs/day for 35.0 years (17.5 ttl pk-yrs)    Types: Cigarettes   Smokeless tobacco: Former   Tobacco comments:    Patches very rarely  Vaping Use   Vaping status: Former  Substance and Sexual Activity   Alcohol use: Not Currently   Drug use: Yes    Types: Marijuana, Crack cocaine , Methamphetamines    Comment: marijuana every day except the joint   Sexual activity: Not Currently  Other Topics Concern   Not on file  Social History Narrative   Not on file   Social Drivers of Health   Financial Resource Strain: Medium Risk (06/13/2022)   Received from [facility]   Overall Financial Resource Strain (CARDIA)    Difficulty of Paying Living Expenses: Somewhat hard  Food Insecurity: Food Insecurity Present (06/18/2024)   Hunger Vital Sign    Worried About Running Out of Food in the Last Year: Sometimes true    Ran Out of Food in the Last Year: Never true  Transportation Needs: Unmet Transportation Needs (06/18/2024)   PRAPARE - Administrator, Civil Service (Medical): Yes    Lack of Transportation (Non-Medical): Yes  Physical Activity: Not on file  Stress: Not on file  Social Connections: Not on file   Past Medical History:  Past Medical History:  Diagnosis Date   Cancer associated pain    GERD (gastroesophageal reflux disease)    Tonsil cancer (HCC)     Past Surgical  History:  Procedure Laterality Date   LARYNGOSCOPY Left 01/24/2022   Procedure: DIRECT LARYNGOSCOPY WITH TONSIL BIOPSY;  Surgeon: Herminio Miu, MD;  Location: [facility] ORS;  Service: ENT;  Laterality: Left;   PORT-A-CATH REMOVAL N/A 02/04/2021   Procedure: REMOVAL PORT-A-CATH;  Surgeon: Desiderio Schanz, MD;  Location: [facility] ORS;  Service: General;  Laterality: N/A;   PORTA CATH INSERTION N/A 10/25/2020   Procedure: PORTA CATH INSERTION;  Surgeon: Marea Selinda RAMAN, MD;  Location: [facility] INVASIVE CV LAB;  Service: Cardiovascular;  Laterality: N/A;   PORTA CATH INSERTION N/A 02/09/2022   Procedure: PORTA CATH INSERTION;  Surgeon: Marea Selinda RAMAN, MD;  Location: [facility] INV[facility] CV [facility];  Service: Cardiovascular;  Laterality: N/A;   REMOVAL OF GASTROSTOMY TUBE N/A 02/04/2021   Procedure: REMOVAL OF GASTROSTOMY TUBE;  Surgeon: Desiderio Schanz, MD;  Location: [facility] ORS;  Service: General;  Laterality: N/A;   TONGUE BIOPSY Left 01/24/2022   Procedure: TONGUE BIOPSY;  Surgeon: Herminio Miu, MD;  Location: [facility] ORS;  Service: ENT;  Laterality: Left;   Family History:  Family History  Problem Relation Age of Onset   Cancer Father     Hospital Course:   The patient was admitted voluntarily to the psychiatric unit for stabilization and safety, placed on suicide, elopement, and assault precautions with q15 minute checks, and his case was reviewed in multidisciplinary treatment team meetings. He engaged in milieu therapy and supportive psychotherapy throughout his hospitalization.  Psychopharmacologic interventions included initiation of Cymbalta , titrated to 60 mg daily for depression and anxiety, gabapentin  titrated to 400 mg BID for pain and anxiety, hydroxyzine  50 mg PRN for anxiety, and trazodone  50 mg PRN for insomnia. He tolerated medications well without adverse effects, and he voiced improved anxiety control and stabilization of mood. Magic mouthwash was initiated for throat pain related to prior radiation therapy.  Over the course of treatment, the patient became more engaged in unit activities and displayed gradual improvement in affect. He consistently denied SI, HI, and AVH after stabilization, reported decreased depressive intensity, and noted gabapentin  was particularly helpful for mood and anxiety. Nursing documented improved participation and use of coping strategies. He remained future oriented, expressing plans to continue medical follow-up with his  oncologist and surgeon regarding tracheostomy closure and port removal, and to pursue disability benefits.  Social work coordinated discharge to [facility], confirmed acceptance, and reviewed housing resources. The patient expressed optimism about transition to the shelter and his ability to maintain outpatient follow-up. Substance use resources and relapse prevention were discussed, and the patient verbalized understanding.  By the time of discharge, he demonstrated insight into his illness, engaged appropriately with peers and staff, and voiced commitment to outpatient medication compliance and follow-up care.  Day of Discharge: On day of discharge he was alert and oriented, pleasant, and cooperative. He was linear, logical, and future oriented. He denied SI, HI, and AVH. He demonstrated good insight into the need for continued medication compliance and outpatient follow-up. He participated in the unit milieu and was focused on his future medical care and transition back to [facility].  A detailed risk assessment was completed, with acute suicide risk assessed as low and acute violence risk as low. Patient was educated and verbalized understanding of the discharge plan, including medications, follow-up appointments, mental health resources, and crisis services. He was instructed to call 911 or present to the nearest emergency room should he experience mood decompensation or return of suicidal or homicidal ideations. Patient verbalized understanding and  agreed to the plan of care.  Physical Findings: AIMS:  , ,  ,  ,    CIWA:  CIWA-Ar Total: 2 COWS:        Psychiatric Specialty Exam:  Presentation  General Appearance:  Casual  Eye Contact: Fair  Speech: Clear and Coherent  Speech Volume: Normal    Mood and Affect  Mood: Euthymic  Affect: Congruent   Thought Process  Thought Processes: Coherent  Descriptions of  Associations:Intact  Orientation:Full (Time, Place and Person)  Thought Content:WDL  Hallucinations:No data recorded  Ideas of Reference:None  Suicidal Thoughts:No data recorded  Homicidal Thoughts:No data recorded   Sensorium  Memory: Immediate Fair; Recent Fair  Judgment: Good  Insight: Good   Executive Functions  Concentration: Fair  Attention Span: Fair  Recall: Fair  Fund of Knowledge: Fair  Language: Fair   Psychomotor Activity  Psychomotor Activity: No data recorded  Musculoskeletal: Strength & Muscle Tone: within normal limits Gait & Station: normal Assets  Assets: Manufacturing systems engineer; Desire for Improvement   Sleep  Sleep: No data recorded    Physical Exam: Physical Exam Vitals and nursing note reviewed.  HENT:     Head: Atraumatic.  Eyes:     Extraocular Movements: Extraocular movements intact.  Pulmonary:     Effort: Pulmonary effort is normal.  Neurological:     Mental Status: He is alert and oriented to person, place, and time.    Review of Systems  Psychiatric/Behavioral:  Negative for hallucinations, memory loss, substance abuse and suicidal ideas. The patient is nervous/anxious. The patient does not have insomnia.    Blood pressure 118/89, pulse 78, temperature 97.7 F (36.5 C), temperature source Oral, resp. rate (!) 22, height 5' 8 (1.727 m), weight 64 kg, SpO2 98%. Body mass index is 21.44 kg/m.   Social History   Tobacco Use  Smoking Status Every Day   Current packs/day: 0.50   Average packs/day: 0.5 packs/day for 35.0 years (17.5 ttl pk-yrs)   Types: Cigarettes  Smokeless Tobacco Former  Tobacco Comments   Patches very rarely   Tobacco Cessation:  A prescription for an FDA-approved tobacco cessation medication was offered at discharge and the patient refused   Blood Alcohol level:  Lab Results  Component Value Date   [facility] <15 06/17/2024   ETH <10 10/05/2021    Metabolic Disorder Labs:  Lab  Results  Component Value Date   HGBA1C 5.6 06/19/2024   MPG 114 06/19/2024   MPG 108.28 10/05/2021   No results found for: PROLACTIN Lab Results  Component Value Date   CHOL 168 06/19/2024   TRIG 62 06/19/2024   HDL 58 06/19/2024   CHOLHDL 2.9 06/19/2024   VLDL 12 06/19/2024   LDLCALC 98 06/19/2024   LDLCALC 133 (H) 10/05/2021    See Psychiatric Specialty Exam and Suicide Risk Assessment completed by Attending Physician prior to discharge.  Discharge destination:  Other:  [place] [facility]  Is patient on multiple antipsychotic therapies at discharge:  No   Has Patient had three or more failed trials of antipsychotic monotherapy by history:  No  Recommended Plan for Multiple Antipsychotic Therapies: NA   Allergies as of 06/25/2024   No Known Allergies      Medication List     STOP taking these medications    calcium carbonate 500 MG chewable tablet Commonly known as: TUMS - dosed in mg elemental calcium   dexamethasone  4 MG tablet Commonly known as: DECADRON    fentaNYL  25 MCG/HR Commonly  known as: DURAGESIC    gabapentin  800 MG tablet Commonly known as: NEURONTIN  Replaced by: gabapentin  400 MG capsule   IBU 600 MG tablet Generic drug: ibuprofen    lidocaine  2 % solution Commonly known as: XYLOCAINE    magic mouthwash w/lidocaine  Soln   nicotine  7 mg/24hr patch Commonly known as: NICODERM CQ  - dosed in mg/24 hr   ondansetron  8 MG tablet Commonly known as: Zofran    Oxycodone  HCl 10 MG Tabs   pantoprazole  20 MG tablet Commonly known as: Protonix    sucralfate  1 g tablet Commonly known as: Carafate        TAKE these medications      Indication  DULoxetine  60 MG capsule Commonly known as: CYMBALTA  Take 1 capsule (60 mg total) by mouth daily.  Indication: Generalized Anxiety Disorder, Major Depressive Disorder, Musculoskeletal Pain   gabapentin  400 MG capsule Commonly known as: NEURONTIN  Take 1 capsule (400 mg total) by mouth 2 (two)  times daily. Replaces: gabapentin  800 MG tablet  Indication: Neuropathic Pain   hydrOXYzine  50 MG tablet Commonly known as: ATARAX  Take 1 tablet (50 mg total) by mouth daily as needed for anxiety.  Indication: allergies, anxiety   traZODone  50 MG tablet Commonly known as: DESYREL  Take 1 tablet (50 mg total) by mouth at bedtime.  Indication: Trouble Sleeping        Follow-up Information     [facility] Follow up.   Why: In person assessment for therapy and medication management is 07/02/24 at 9 AM. Contact information: [address] [place] [address] [phone]                 Follow-up recommendations:  # It is recommended to the patient to continue psychiatric medications as prescribed, after discharge from the hospital.   # It is recommended to the patient to follow up with your outpatient psychiatric provider and PCP. # It was discussed with the patient, the impact of alcohol, drugs, tobacco have been there overall psychiatric and medical wellbeing, and total abstinence from substance use was recommended. # Prescriptions provided or sent directly to preferred pharmacy at discharge. Patient agreeable to plan. Given the opportunity to ask questions. Appears to feel comfortable with discharge.  # In the event of worsening symptoms, the patient is instructed to call the crisis hotline (988), 911 and or go to the nearest ED for appropriate evaluation and treatment of symptoms. To follow-up with primary care provider for other medical issues, concerns and or health care needs # Patient was discharged as requested with a plan to follow up as noted above.      Signed: Donnice FORBES Right, PA-C 06/27/2024, 5:12 PM

## 2024-06-25 NOTE — Progress Notes (Signed)
  [facility] Adult Case Management Discharge Plan :  Will you be returning to the same living situation after discharge:  Yes,  Patient to return to [facility].  At discharge, do you have transportation home?: Yes,  CSW has arranged taxi services on patient's behalf.  Do you have the ability to pay for your medications: Yes,  [place] [employer] / [employer]  Release of information consent forms completed and in the chart;  Patient's signature needed at discharge.  Patient to Follow up at:  Follow-up Information     [facility] Follow up.   Why: In person assessment for therapy and medication management is 07/02/24 at 9 AM. Contact information: [address] [place] [address] [phone]                 Next level of care provider has access to [facility] Link:no  Safety Planning and Suicide Prevention discussed: Yes,  Education Completed; Darry Kelnhofer, brother, [phone])  (name of family member/significant other) has been identified by the patient as the family member/significant other with whom the patient will be residing, and identified as the person(s) who will aid the patient in the event of a mental health crisis (suicidal ideations/suicide attempt).  With written consent from the patient, the family member/significant other has been provided the following suicide prevention education, prior to the and/or following the discharge of the patient.     Has patient been referred to the Quitline?: Patient refused referral for treatment  Patient has been referred for addiction treatment: Yes, the patient will follow up with an outpatient provider for substance use disorder. Psychiatrist/APP: appointment made and Therapist: appointment made  Alveta CHRISTELLA Kerns, LCSW 06/25/2024, 9:19 AM

## 2024-06-25 NOTE — Plan of Care (Signed)
   Problem: Education: Goal: Emotional status will improve Outcome: Progressing Goal: Mental status will improve Outcome: Progressing

## 2024-06-25 NOTE — Group Note (Signed)
 [facility] LCSW Group Therapy Note   Group Date: 06/25/2024 Start Time: 1030 End Time: 1136   Type of Therapy/Topic:  Group Therapy:  Balance in Life  Participation Level:  Active   Description of Group:    This group will address the concept of balance and how it feels and looks when one is unbalanced. Patients will be encouraged to process areas in their lives that are out of balance, and identify reasons for remaining unbalanced. Facilitators will guide patients utilizing problem- solving interventions to address and correct the stressor making their life unbalanced. Understanding and applying boundaries will be explored and addressed for obtaining  and maintaining a balanced life. Patients will be encouraged to explore ways to assertively make their unbalanced needs known to significant others in their lives, using other group members and facilitator for support and feedback.  Therapeutic Goals: Patient will identify two or more emotions or situations they have that consume much of in their lives. Patient will identify signs/triggers that life has become out of balance:  Patient will identify two ways to set boundaries in order to achieve balance in their lives:  Patient will demonstrate ability to communicate their needs through discussion and/or role plays  Summary of Patient Progress: The patient and group discussed various stressors they were experiencing and actively working to navigate. Using solution-focused techniques, the group and facilitator collaborated to develop helpful strategies and perspectives for managing these challenges. The session emphasized improving balance, self-awareness, decision-making, and emotional regulation. The patient was supportive of peers, open to feedback, and actively engaged throughout the session.    Therapeutic Modalities:   Cognitive Behavioral Therapy Solution-Focused Therapy Assertiveness Training   Alveta CHRISTELLA Kerns, LCSW

## 2024-06-25 NOTE — Progress Notes (Signed)
   06/25/24 1000  Psych Admission Type (Psych Patients Only)  Admission Status Voluntary  Psychosocial Assessment  Patient Complaints Anxiety;Depression  Eye Contact Fair  Facial Expression Anxious  Affect Anxious  Speech Logical/coherent  Interaction Assertive  Motor Activity Other (Comment) (appropriate for developmental age)  Appearance/Hygiene Unremarkable  Behavior Characteristics Cooperative;Anxious  Mood Anxious;Pleasant (Patient writes his goal for today is regulating my mood swings and anxiety. He writes will be pray and take my medications to help him reach that goal.)  Thought Process  Coherency WDL  Content WDL  Delusions None reported or observed  Perception WDL  Hallucination None reported or observed  Judgment WDL  Confusion None  Danger to Self  Current suicidal ideation? Denies  Danger to Others  Danger to Others None reported or observed

## 2024-06-25 NOTE — Plan of Care (Signed)
?  Problem: Education: ?Goal: Knowledge of [facility] Education information/materials will improve ?Outcome: Adequate for Discharge ?Goal: Emotional status will improve ?Outcome: Adequate for Discharge ?Goal: Mental status will improve ?Outcome: Adequate for Discharge ?Goal: Verbalization of understanding the information provided will improve ?Outcome: Adequate for Discharge ?  ?Problem: Activity: ?Goal: Interest or engagement in activities will improve ?Outcome: Adequate for Discharge ?Goal: Sleeping patterns will improve ?Outcome: Adequate for Discharge ?  ?Problem: Coping: ?Goal: Ability to verbalize frustrations and anger appropriately will improve ?Outcome: Adequate for Discharge ?Goal: Ability to demonstrate self-control will improve ?Outcome: Adequate for Discharge ?  ?Problem: Health Behavior/Discharge Planning: ?Goal: Identification of resources available to assist in meeting health care needs will improve ?Outcome: Adequate for Discharge ?Goal: Compliance with treatment plan for underlying cause of condition will improve ?Outcome: Adequate for Discharge ?  ?Problem: Physical Regulation: ?Goal: Ability to maintain clinical measurements within normal limits will improve ?Outcome: Adequate for Discharge ?  ?Problem: Safety: ?Goal: Periods of time without injury will increase ?Outcome: Adequate for Discharge ?  ?Problem: Education: ?Goal: Knowledge of disease or condition will improve ?Outcome: Adequate for Discharge ?Goal: Understanding of discharge needs will improve ?Outcome: Adequate for Discharge ?  ?Problem: Health Behavior/Discharge Planning: ?Goal: Ability to identify changes in lifestyle to reduce recurrence of condition will improve ?Outcome: Adequate for Discharge ?Goal: Identification of resources available to assist in meeting health care needs will improve ?Outcome: Adequate for Discharge ?  ?Problem: Physical Regulation: ?Goal: Complications related to the disease process, condition or  treatment will be avoided or minimized ?Outcome: Adequate for Discharge ?  ?Problem: Safety: ?Goal: Ability to remain free from injury will improve ?Outcome: Adequate for Discharge ?  ?

## 2024-09-02 ENCOUNTER — Emergency Department
Admission: EM | Admit: 2024-09-02 | Discharge: 2024-09-03 | Disposition: A | Discharge status: Other Acute Inpatient Hospital | Source: Home / Self Care | Attending: Emergency Medicine | Admitting: Emergency Medicine

## 2024-09-02 ENCOUNTER — Other Ambulatory Visit: Payer: Self-pay

## 2024-09-02 DIAGNOSIS — F322 Major depressive disorder, single episode, severe without psychotic features: Secondary | ICD-10-CM | POA: Insufficient documentation

## 2024-09-02 DIAGNOSIS — R45851 Suicidal ideations: Secondary | ICD-10-CM | POA: Insufficient documentation

## 2024-09-02 DIAGNOSIS — Z85818 Personal history of malignant neoplasm of other sites of lip, oral cavity, and pharynx: Secondary | ICD-10-CM | POA: Insufficient documentation

## 2024-09-02 LAB — COMPREHENSIVE METABOLIC PANEL WITH GFR
ALT: 10 U/L (ref 0–44)
AST: 22 U/L (ref 15–41)
Albumin: 4.6 g/dL (ref 3.5–5.0)
Alkaline Phosphatase: 81 U/L (ref 38–126)
Anion gap: 11 (ref 5–15)
BUN: 11 mg/dL (ref 6–20)
CO2: 29 mmol/L (ref 22–32)
Calcium: 9.5 mg/dL (ref 8.9–10.3)
Chloride: 100 mmol/L (ref 98–111)
Creatinine, Ser: 0.93 mg/dL (ref 0.61–1.24)
GFR, Estimated: >60 mL/min (ref >60)
Glucose, Bld: 102 mg/dL — ABNORMAL HIGH (ref 70–99)
Potassium: 4.3 mmol/L (ref 3.5–5.1)
Sodium: 140 mmol/L (ref 135–145)
Total Bilirubin: 0.2 mg/dL (ref 0.0–1.2)
Total Protein: 7 g/dL (ref 6.5–8.1)

## 2024-09-02 LAB — CBC
HCT: 42 % (ref 39.0–52.0)
Hemoglobin: 14.5 g/dL (ref 13.0–17.0)
MCH: 33.3 pg (ref 26.0–34.0)
MCHC: 34.5 g/dL (ref 30.0–36.0)
MCV: 96.6 fL (ref 80.0–100.0)
Platelets: 227 K/uL (ref 150–400)
RBC: 4.35 MIL/uL (ref 4.22–5.81)
RDW: 12.6 % (ref 11.5–15.5)
WBC: 7 K/uL (ref 4.0–10.5)
nRBC: 0 % (ref 0.0–0.2)

## 2024-09-02 LAB — ETHANOL: Alcohol, Ethyl (B): <15 mg/dL (ref <15)

## 2024-09-02 LAB — URINE DRUG SCREEN
Amphetamines: NEGATIVE
Barbiturates: NEGATIVE
Benzodiazepines: NEGATIVE
Cocaine: POSITIVE — AB
Fentanyl: NEGATIVE
Methadone Scn, Ur: NEGATIVE
Opiates: NEGATIVE
Tetrahydrocannabinol: NEGATIVE

## 2024-09-02 MED ORDER — DIPHENHYDRAMINE HCL 25 MG PO CAPS
25.0000 mg | ORAL_CAPSULE | Freq: Once | ORAL | Status: AC
  Administered 2024-09-02: 25 mg via ORAL
  Filled 2024-09-02: qty 1
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
## 2024-09-02 NOTE — ED Provider Notes (Signed)
 [facility] Provider Note    Event Date/Time   First MD Initiated Contact with Patient 09/02/24 1830     (approximate)   History   Psychiatric Evaluation   HPI  Paul Hebert is a 53 y.o. male who presents today with concern of psychiatric evaluation.  Has been having suicidal ideations over the last few days, recently ran out of his home psychiatric medications.  He tells me that his best friend recently committed suicide, and now he is also having similar thoughts.  Denies any attempts today.  Denies any other medical complaints although he does have underlying history of cancer that is now in remission.  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      Physical Exam   Triage Vital Signs: ED Triage Vitals  Encounter Vitals Group     BP 09/02/24 1805 (!) 138/103     Girls Systolic BP Percentile --      Girls Diastolic BP Percentile --      Boys Systolic BP Percentile --      Boys Diastolic BP Percentile --      Pulse Rate 09/02/24 1805 90     Resp 09/02/24 1805 17     Temp 09/02/24 1805 97.8 F (36.6 C)     Temp Source 09/02/24 1805 Oral     SpO2 09/02/24 1805 100 %     Weight 09/02/24 1809 145 lb (65.8 kg)     Height 09/02/24 1809 5' 8 (1.727 m)     Head Circumference --      Peak Flow --      Pain Score 09/02/24 1806 7     Pain Loc --      Pain Education --      Exclude from Growth Chart --     Most recent vital signs: Vitals:   09/02/24 1805  BP: (!) 138/103  Pulse: 90  Resp: 17  Temp: 97.8 F (36.6 C)  SpO2: 100%     General: Awake, no distress.  CV:  Good peripheral perfusion.  Resp:  Normal effort.  Abd:  No distention.  Other:     ED Results / Procedures / Treatments   Labs (all labs ordered are listed, but only abnormal results are displayed) Labs Reviewed  COMPREHENSIVE METABOLIC PANEL WITH GFR - Abnormal; Notable  for the following components:      Result Value   Glucose, Bld 102 (*)    All other components within normal limits  URINE DRUG SCREEN - Abnormal; Notable for the following components:   Cocaine  POSITIVE (*)    All other components within normal limits  ETHANOL  CBC     EKG     RADIOLOGY   PROCEDURES:  Critical Care performed: No  Procedures   MEDICATIONS ORDERED IN ED: Medications  diphenhydrAMINE  (BENADRYL ) capsule 25 mg (25 mg Oral Given 09/02/24 1935)     IMPRESSION / MDM / ASSESSMENT AND PLAN / ED COURSE  I reviewed the triage vital signs and the nursing notes.                               Patient's presentation is most consistent with acute, uncomplicated illness.  53 year old male who presents today with concern of suicidal ideations.  Recently ran out of his home medication and is  now having increased frequency of suicidal ideations.  He appears well he is not in any acute distress.  Will have the patient evaluated by psychiatry for further disposition and management..  The patient has been placed in psychiatric observation due to the need to provide a safe environment for the patient while obtaining psychiatric consultation and evaluation, as well as ongoing medical and medication management to treat the patient's condition.  The patient has not been placed under full IVC at this time.        FINAL CLINICAL IMPRESSION(S) / ED DIAGNOSES   Final diagnoses:  Suicidal ideation     Rx / DC Orders   ED Discharge Orders     None        Note:  This document was prepared using Dragon voice recognition software and may include unintentional dictation errors.   Fernand Rossie HERO, MD 09/02/24 [phone]

## 2024-09-02 NOTE — ED Notes (Signed)
Pt given PM snack.  

## 2024-09-02 NOTE — ED Notes (Signed)
 Pt reports itchy feeling all over body that pt attributes to paper scrubs. As alternative scrubs are not available, MD notified and Benadryl  ordered.

## 2024-09-02 NOTE — ED Notes (Signed)
 Pt transferred from quad area.  Received report from dorian rn.  Pt informed of cameras, bathrooms and rules of unit on door at nurses station.  Pt calm and cooperative.

## 2024-09-02 NOTE — ED Notes (Signed)
 Tv turned on.

## 2024-09-02 NOTE — BH Assessment (Signed)
 Comprehensive Clinical Assessment (CCA) Note  09/02/2024 Paul Hebert [id number] Recommendations for Services/Supports/Treatments: Consulted with NP Jon HERO., who recommended pt for inpatient treatment. Paul Hebert is a 53 year old, English speaking, Caucasian male. Pt is voluntary. Per triage note: Pt arrives via POV in need of psych evaluation. Pt endorses SI and states that it's a good thing that I don't have access to a gun. Pt states that he's not in a good place right now. Pt just lost his best friend to suicide, lost his only child in 2010 and hasn't taken their meds in 5-6 weeks. Pt states that the isolation where they live is making things worse. Pt denies any AH/VH at this time. Pt is A&Ox4 and ambulatory during triage.    On assessment the pt. was depressed and drowsy. Pt had slowed psychomotor activity, however, pt attempted to answer assessment questions to the best of his ability. Pt reported that he had thoughts of SI while [place]. Pt identified a need for mood stabilization when asked about his current needs. Pt reports drinking alcohol, daily and occasional cocaine  use; last use was 3 days ago. Pt identified his main stressors just getting out of a 5-year toxic relationship, his mental health problems and medication noncompliance, his medical problems, and grief due to his friend "blowing his brains out about 1 week ago". Pt expressed feelings of sadness and hopelessness. Pt was not responding to internal/external stimuli, nor did pt. present with delusional thoughts. Pt had clear speech and was oriented x4. Pt presented with a depressed mood; affect was flat. Pt had a disheveled appearance. Pt denied SI/HI/AV/H. Pt reported that he'd just moved into his own apartment. 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   SI  CCA Screening, Triage and Referral (STR)  Patient Reported Information How did you hear about us ? Self  Referral name: No data recorded Referral phone number: No data recorded  Whom do you see for routine medical problems? No data recorded Practice/Facility Name: No data recorded Practice/Facility Phone Number: No data recorded Name of Contact: No data recorded Contact Number: No data recorded Contact Fax Number: No data recorded Prescriber Name: No data recorded Prescriber Address (if known): No data recorded  What Is the Reason for Your Visit/Call Today? Pt is feeling overwhelmed with his health problems and personal life. Worsening SI for the past couple of days.  How Long Has This Been Causing You Problems? 1 wk - 1 month  What Do You Feel Would Help You the Most Today? Treatment for Depression or other mood problem; Social Support; Stress Management; Medication(s)   Have You Recently Been in Any Inpatient Treatment (Hospital/Detox/Crisis Center/28-Day Program)? No data recorded Name/Location of Program/Hospital:No data recorded How Long Were You There? No data recorded When Were You Discharged? No data recorded  Have You Ever Received Services From [facility] Before? No data recorded Who Do You See at [facility]? No data recorded  Have You Recently Had Any Thoughts About Hurting Yourself? Yes  Are You Planning to Commit Suicide/Harm Yourself At This time? No   Have you Recently Had Thoughts About Hurting Someone [place]? No  Explanation: No data recorded  Have You Used Any Alcohol or Drugs in the Past 24  Hours? Yes  How Long Ago Did You Use Drugs or Alcohol? No data recorded What Did You Use and How Much? Patient reports cocaine  use and drinks 2-24oz beers daily.   Do You Currently Have a Therapist/Psychiatrist? No  Name of Therapist/Psychiatrist: No data recorded  Have  You Been Recently Discharged From Any Office Practice or Programs? No  Explanation of Discharge From Practice/Program: No data recorded    CCA Screening Triage Referral Assessment Type of Contact: Face-to-Face  Is this Initial or Reassessment? No data recorded Date Telepsych consult ordered in [facility]:  No data recorded Time Telepsych consult ordered in [facility]:  No data recorded  Patient Reported Information Reviewed? No data recorded Patient Left Without Being Seen? No data recorded Reason for Not Completing Assessment: No data recorded  Collateral Involvement: None provided   Does Patient Have a Court Appointed Legal Guardian? No data recorded Name and Contact of Legal Guardian: No data recorded If Minor and Not Living with Parent(s), Who has Custody? No data recorded Is CPS involved or ever been involved? No data recorded Is APS involved or ever been involved? No data recorded  Patient Determined To Be At Risk for Harm To Self or Others Based on Review of Patient Reported Information or Presenting Complaint? Yes, for Self-Harm  Method: No Plan  Availability of Means: No access or NA  Intent: Vague intent or NA  Notification Required: No need or identified person  Additional Information for Danger to Others Potential: No data recorded Additional Comments for Danger to Others Potential: No data recorded Are There Guns or Other Weapons in Your Home? No  Types of Guns/Weapons: No data recorded Are These Weapons Safely Secured?                            No data recorded Who Could Verify You Are Able To Have These Secured: No data recorded Do You Have any Outstanding Charges, Pending Court Dates, Parole/Probation? No data recorded Contacted To Inform of Risk of Harm To Self or Others: No data recorded  Location of Assessment: [facility]   Does Patient Present under Involuntary Commitment? No  IVC Papers Initial File Date: No data recorded  [place] of Residence:  [place]   Patient Currently Receiving the Following Services: Not Receiving Services   Determination of Need: Emergent (2 hours)   Options For Referral: ED Visit; Medication Management; Inpatient Hospitalization; Intensive Outpatient Therapy     CCA Biopsychosocial Intake/Chief Complaint:  No data recorded Current Symptoms/Problems: No data recorded  Patient Reported Schizophrenia/Schizoaffective Diagnosis in Past: No   Strengths: Pt is able to express his thoughts, feelings, and concerns. He is actively looking for assistance.  Preferences: No data recorded Abilities: No data recorded  Type of Services Patient Feels are Needed: No data recorded  Initial Clinical Notes/Concerns: No data recorded  Mental Health Symptoms Depression:  Difficulty Concentrating; Fatigue; Hopelessness; Irritability; Sleep (too much or little); Worthlessness; Tearfulness; Increase/decrease in appetite   Duration of Depressive symptoms: Greater than two weeks   Mania:  None   Anxiety:   Difficulty concentrating; Worrying; Sleep; Fatigue; Tension   Psychosis:  None   Duration of Psychotic symptoms: No data recorded  Trauma:  None   Obsessions:  None   Compulsions:  None   Inattention:  None   Hyperactivity/Impulsivity:  Feeling of restlessness   Oppositional/Defiant Behaviors:  N/A   Emotional Irregularity:  Potentially harmful impulsivity; Recurrent suicidal behaviors/gestures/threats  Other Mood/Personality Symptoms:  None noted    Mental Status Exam Appearance and self-care  Stature:  Average   Weight:  Average weight   Clothing:  No data recorded  Grooming:  Neglected   Cosmetic use:  None   Posture/gait:  Normal   Motor activity:  Not Remarkable   Sensorium  Attention:  Normal   Concentration:  Normal   Orientation:  X5   Recall/memory:  Normal   Affect and Mood  Affect:  Anxious; Depressed   Mood:  Anxious; Depressed   Relating  Eye contact:   Normal   Facial expression:  Depressed; Anxious   Attitude toward examiner:  Cooperative   Thought and Language  Speech flow: Normal   Thought content:  Appropriate to Mood and Circumstances   Preoccupation:  No data recorded  Hallucinations:  None   Organization:  No data recorded  [employer] of Knowledge:  Average   Intelligence:  Average   Abstraction:  Functional   Judgement:  Poor   Reality Testing:  Distorted   Insight:  Fair   Decision Making:  Impulsive   Social Functioning  Social Maturity:  Impulsive   Social Judgement:  Chief Of Staff   Stress  Stressors:  Surveyor, Quantity; Housing; Illness; Transitions   Coping Ability:  Exhausted; Overwhelmed; Deficient supports   Skill Deficits:  Self-control; Decision making   Supports:  Support needed     Religion:    Leisure/Recreation:    Exercise/Diet:     CCA Employment/Education Employment/Work Situation:    Education:     CCA Family/Childhood History Family and Relationship History:    Childhood History:     Child/Adolescent Assessment:     CCA Substance Use Alcohol/Drug Use:                           ASAM's:  Six Dimensions of Multidimensional Assessment  Dimension 1:  Acute Intoxication and/or Withdrawal Potential:      Dimension 2:  Biomedical Conditions and Complications:      Dimension 3:  Emotional, Behavioral, or Cognitive Conditions and Complications:     Dimension 4:  Readiness to Change:     Dimension 5:  Relapse, Continued use, or Continued Problem Potential:     Dimension 6:  Recovery/Living Environment:     ASAM Severity Score:    ASAM Recommended Level of Treatment:     Substance use Disorder (SUD)    Recommendations for Services/Supports/Treatments:    DSM5 Diagnoses: Patient Active Problem List   Diagnosis Date Noted   MDD (major depressive disorder), recurrent severe, without psychosis (HCC) 06/18/2024   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   Homeless 10/09/2021   Polysubstance abuse (HCC) 10/09/2021   Substance-induced psychotic disorder (HCC)    Psychoactive substance-induced psychosis (HCC) 10/04/2021   Severe recurrent major depression without psychotic features (HCC) 02/05/2021   Cocaine  abuse (HCC) 01/28/2021   Alcohol abuse 01/28/2021   Severe major depression, single episode (HCC) 01/28/2021   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

## 2024-09-03 ENCOUNTER — Encounter: Payer: Self-pay | Admitting: Psychiatry

## 2024-09-03 ENCOUNTER — Inpatient Hospital Stay
Admission: AD | Admit: 2024-09-03 | Discharge: 2024-09-08 | DRG: 885 | Disposition: A | Discharge status: Home / Self Care | Source: Intra-hospital | Attending: Psychiatry | Admitting: Psychiatry

## 2024-09-03 DIAGNOSIS — R45851 Suicidal ideations: Secondary | ICD-10-CM | POA: Diagnosis present

## 2024-09-03 DIAGNOSIS — F329 Major depressive disorder, single episode, unspecified: Principal | ICD-10-CM | POA: Diagnosis present

## 2024-09-03 DIAGNOSIS — G4700 Insomnia, unspecified: Secondary | ICD-10-CM | POA: Diagnosis present

## 2024-09-03 DIAGNOSIS — R682 Dry mouth, unspecified: Secondary | ICD-10-CM | POA: Diagnosis present

## 2024-09-03 DIAGNOSIS — K1379 Other lesions of oral mucosa: Secondary | ICD-10-CM | POA: Diagnosis present

## 2024-09-03 DIAGNOSIS — Z5982 Transportation insecurity: Secondary | ICD-10-CM

## 2024-09-03 DIAGNOSIS — F1290 Cannabis use, unspecified, uncomplicated: Secondary | ICD-10-CM | POA: Diagnosis present

## 2024-09-03 DIAGNOSIS — F22 Delusional disorders: Secondary | ICD-10-CM | POA: Diagnosis present

## 2024-09-03 DIAGNOSIS — F332 Major depressive disorder, recurrent severe without psychotic features: Principal | ICD-10-CM | POA: Diagnosis present

## 2024-09-03 DIAGNOSIS — Z91148 Patient's other noncompliance with medication regimen for other reason: Secondary | ICD-10-CM

## 2024-09-03 DIAGNOSIS — R21 Rash and other nonspecific skin eruption: Secondary | ICD-10-CM | POA: Diagnosis present

## 2024-09-03 DIAGNOSIS — Z5941 Food insecurity: Secondary | ICD-10-CM

## 2024-09-03 DIAGNOSIS — Z9151 Personal history of suicidal behavior: Secondary | ICD-10-CM | POA: Diagnosis not present

## 2024-09-03 DIAGNOSIS — Z5948 Other specified lack of adequate food: Secondary | ICD-10-CM

## 2024-09-03 DIAGNOSIS — T50995A Adverse effect of other drugs, medicaments and biological substances, initial encounter: Secondary | ICD-10-CM | POA: Diagnosis present

## 2024-09-03 DIAGNOSIS — Z79899 Other long term (current) drug therapy: Secondary | ICD-10-CM

## 2024-09-03 DIAGNOSIS — Y842 Radiological procedure and radiotherapy as the cause of abnormal reaction of the patient, or of later complication, without mention of misadventure at the time of the procedure: Secondary | ICD-10-CM | POA: Diagnosis present

## 2024-09-03 DIAGNOSIS — F17210 Nicotine dependence, cigarettes, uncomplicated: Secondary | ICD-10-CM | POA: Diagnosis present

## 2024-09-03 DIAGNOSIS — F4310 Post-traumatic stress disorder, unspecified: Secondary | ICD-10-CM | POA: Diagnosis present

## 2024-09-03 DIAGNOSIS — F411 Generalized anxiety disorder: Secondary | ICD-10-CM | POA: Diagnosis present

## 2024-09-03 DIAGNOSIS — Z23 Encounter for immunization: Secondary | ICD-10-CM | POA: Diagnosis not present

## 2024-09-03 DIAGNOSIS — Z59868 Other specified financial insecurity: Secondary | ICD-10-CM | POA: Diagnosis not present

## 2024-09-03 DIAGNOSIS — Z85818 Personal history of malignant neoplasm of other sites of lip, oral cavity, and pharynx: Secondary | ICD-10-CM

## 2024-09-03 DIAGNOSIS — F4321 Adjustment disorder with depressed mood: Secondary | ICD-10-CM | POA: Diagnosis present

## 2024-09-03 DIAGNOSIS — Z91040 Latex allergy status: Secondary | ICD-10-CM

## 2024-09-03 DIAGNOSIS — Z653 Problems related to other legal circumstances: Secondary | ICD-10-CM

## 2024-09-03 DIAGNOSIS — L509 Urticaria, unspecified: Secondary | ICD-10-CM | POA: Diagnosis not present

## 2024-09-03 MED ORDER — HALOPERIDOL 5 MG PO TABS: 5.0000 mg | ORAL_TABLET | Freq: Three times a day (TID) | ORAL | Status: DC | PRN

## 2024-09-03 MED ORDER — DIPHENHYDRAMINE HCL 25 MG PO CAPS
50.0000 mg | ORAL_CAPSULE | Freq: Three times a day (TID) | ORAL | Status: DC | PRN
  Administered 2024-09-03: 50 mg via ORAL
  Filled 2024-09-03: qty 2

## 2024-09-03 MED ORDER — HYDROXYZINE HCL 25 MG PO TABS
50.0000 mg | ORAL_TABLET | Freq: Every day | ORAL | Status: DC | PRN
Start: 2024-09-03 — End: 2024-09-03

## 2024-09-03 MED ORDER — DULOXETINE HCL 30 MG PO CPEP
60.0000 mg | ORAL_CAPSULE | Freq: Every day | ORAL | Status: DC
  Administered 2024-09-04 – 2024-09-08 (×5): 60 mg via ORAL
  Filled 2024-09-03 (×5): qty 2

## 2024-09-03 MED ORDER — GABAPENTIN 100 MG PO CAPS
100.0000 mg | ORAL_CAPSULE | Freq: Two times a day (BID) | ORAL | Status: DC
  Administered 2024-09-04 – 2024-09-05 (×3): 100 mg via ORAL
  Filled 2024-09-03 (×3): qty 1

## 2024-09-03 MED ORDER — HALOPERIDOL LACTATE 5 MG/ML IJ SOLN: 5.0000 mg | Freq: Three times a day (TID) | INTRAMUSCULAR | Status: DC | PRN

## 2024-09-03 MED ORDER — MAGNESIUM HYDROXIDE 400 MG/5ML PO SUSP
30.0000 mL | Freq: Every day | ORAL | Status: DC | PRN
Start: 2024-09-03 — End: 2024-09-08
  Administered 2024-09-05: 30 mL via ORAL
  Filled 2024-09-03 (×2): qty 30

## 2024-09-03 MED ORDER — NICOTINE 21 MG/24HR TD PT24
21.0000 mg | MEDICATED_PATCH | Freq: Every day | TRANSDERMAL | Status: DC
  Administered 2024-09-04 – 2024-09-07 (×4): 21 mg via TRANSDERMAL
  Filled 2024-09-03 (×5): qty 1

## 2024-09-03 MED ORDER — HYDROXYZINE HCL 50 MG PO TABS
50.0000 mg | ORAL_TABLET | Freq: Every day | ORAL | Status: DC | PRN
Start: 2024-09-03 — End: 2024-09-08
  Administered 2024-09-06 – 2024-09-08 (×3): 50 mg via ORAL
  Filled 2024-09-03 (×3): qty 1

## 2024-09-03 MED ORDER — GABAPENTIN 300 MG PO CAPS
400.0000 mg | ORAL_CAPSULE | Freq: Two times a day (BID) | ORAL | Status: DC
  Administered 2024-09-03 (×2): 400 mg via ORAL
  Filled 2024-09-03 (×2): qty 1

## 2024-09-03 MED ORDER — DIPHENHYDRAMINE HCL 50 MG/ML IJ SOLN: 50.0000 mg | Freq: Three times a day (TID) | INTRAMUSCULAR | Status: DC | PRN

## 2024-09-03 MED ORDER — TRAZODONE HCL 50 MG PO TABS
50.0000 mg | ORAL_TABLET | Freq: Every day | ORAL | Status: DC
  Administered 2024-09-03 – 2024-09-04 (×2): 50 mg via ORAL
  Filled 2024-09-03 (×2): qty 1

## 2024-09-03 MED ORDER — TRAZODONE HCL 50 MG PO TABS
50.0000 mg | ORAL_TABLET | Freq: Every day | ORAL | Status: DC
  Administered 2024-09-03: 50 mg via ORAL
  Filled 2024-09-03: qty 1

## 2024-09-03 MED ORDER — LORAZEPAM 2 MG/ML IJ SOLN: 2.0000 mg | Freq: Three times a day (TID) | INTRAMUSCULAR | Status: DC | PRN

## 2024-09-03 MED ORDER — LORAZEPAM 2 MG/ML IJ SOLN
2.0000 mg | Freq: Three times a day (TID) | INTRAMUSCULAR | Status: DC | PRN
Start: 2024-09-03 — End: 2024-09-08

## 2024-09-03 MED ORDER — DULOXETINE HCL 60 MG PO CPEP
60.0000 mg | ORAL_CAPSULE | Freq: Every day | ORAL | Status: DC
  Administered 2024-09-03: 60 mg via ORAL
  Filled 2024-09-03: qty 1

## 2024-09-03 MED ORDER — DIPHENHYDRAMINE HCL 50 MG/ML IJ SOLN
50.0000 mg | Freq: Three times a day (TID) | INTRAMUSCULAR | Status: DC | PRN
Start: 2024-09-03 — End: 2024-09-08

## 2024-09-03 MED ORDER — ALUM & MAG HYDROXIDE-SIMETH 200-200-20 MG/5ML PO SUSP: 30.0000 mL | ORAL | Status: DC | PRN

## 2024-09-03 MED ORDER — HALOPERIDOL LACTATE 5 MG/ML IJ SOLN: 10.0000 mg | Freq: Three times a day (TID) | INTRAMUSCULAR | Status: DC | PRN

## 2024-09-03 MED ORDER — ACETAMINOPHEN 325 MG PO TABS
650.0000 mg | ORAL_TABLET | Freq: Four times a day (QID) | ORAL | Status: DC | PRN
  Administered 2024-09-05 – 2024-09-07 (×2): 650 mg via ORAL
  Filled 2024-09-03 (×3): qty 2

## 2024-09-03 NOTE — ED Notes (Signed)
 Report has been called. Pt transported to [facility] with tech and security at this time. Pt aware of admission. Pt calm and cooperative. Pt denies any questions at this time.

## 2024-09-03 NOTE — ED Notes (Signed)
 Pt requested and given ear plugs to help sleep

## 2024-09-03 NOTE — ED Notes (Signed)
 vol/consult done/recommended for inpatient psychiatric hospitalization after medically cleared.

## 2024-09-03 NOTE — ED Notes (Addendum)
 Report called to Leita RN at this time. RN was told to hold off bringing down this pt down at this time.

## 2024-09-03 NOTE — Tx Team (Signed)
 Initial Treatment Plan 09/03/2024 7:02 PM Paul Hebert [id number]    PATIENT STRESSORS: Loss of best friend to recent suicide      PATIENT STRENGTHS: Capable of independent living  Communication skills  General fund of knowledge  Motivation for treatment/growth    PATIENT IDENTIFIED PROBLEMS:                      DISCHARGE CRITERIA:  Ability to meet basic life and health needs Improved stabilization in mood, thinking, and/or behavior Motivation to continue treatment in a less acute level of care Verbal commitment to aftercare and medication compliance  PRELIMINARY DISCHARGE PLAN: Return to previous living arrangement  PATIENT/FAMILY INVOLVEMENT: This treatment plan has been presented to and reviewed with the patient, Paul Hebert. The patient has been given the opportunity to ask questions and make suggestions.  Leita MARLA Rosella, RN 09/03/2024, 7:02 PM

## 2024-09-03 NOTE — ED Notes (Signed)
 Pt given dinner tray and beverage

## 2024-09-03 NOTE — Group Note (Signed)
 Date:  09/03/2024 Time:  8:47 PM  Group Topic/Focus:  Wrap-Up Group:   The focus of this group is to help patients review their daily goal of treatment and discuss progress on daily workbooks.    Participation Level:  Active  Participation Quality:  Appropriate and Attentive  Affect:  Appropriate  Cognitive:  Alert and Appropriate  Insight: Appropriate and Good  Engagement in Group:  Engaged  Modes of Intervention:  Orientation  Additional Comments:     Arlester CHRISTELLA Servant 09/03/2024, 8:47 PM

## 2024-09-03 NOTE — ED Provider Notes (Signed)
 Emergency Medicine Observation Re-evaluation Note  Paul Hebert is a 53 y.o. male, seen on rounds today.  Pt initially presented to the ED for complaints of Psychiatric Evaluation Currently, the patient is sleeping without distress.  Physical Exam  BP (!) 138/103 (BP Location: Left Arm)   Pulse 90   Temp 97.8 F (36.6 C) (Oral)   Resp 17   Ht 5' 8 (1.727 m)   Wt 65.8 kg   SpO2 100%   BMI 22.05 kg/m  Physical Exam   Sleeping without distress.  Resting.  Nursing advising no issues overnight  ED Course / MDM  EKG:   I have reviewed the labs performed to date as well as medications administered while in observation.  Psychiatry does recommend inpatient hospitalization  Plan  We recommend inpatient psychiatric hospitalization when medically cleared. Patient is under voluntary admission status at this time; please IVC if attempts to leave hospital per psych notes.    Dicky Anes, MD 09/03/24 [phone]

## 2024-09-03 NOTE — Consult Note (Signed)
 [facility] Psychiatric Consult Initial  Patient Name: .Paul Hebert  MRN: [id number]  DOB: [birth year]  Consult Order details:  Orders (From admission, onward)     Start     Ordered   09/02/24 1914  CONSULT TO CALL ACT TEAM       Ordering Provider: Fernand Rossie HERO, MD  Provider:  (Not yet assigned)  Question:  Reason for Consult?  Answer:  Psych consult   09/02/24 1913   09/02/24 1914  IP CONSULT TO PSYCHIATRY       Ordering Provider: Fernand Rossie HERO, MD  Provider:  (Not yet assigned)  Question:  Reason for consult:  Answer:  Medication management   09/02/24 1913             Mode of Visit: Tele-visit Virtual Statement:TELE PSYCHIATRY ATTESTATION & CONSENT As the provider for this telehealth consult, I attest that I verified the patient's identity using two separate identifiers, introduced myself to the patient, provided my credentials, disclosed my location, and performed this encounter via a HIPAA-compliant, real-time, face-to-face, two-way, interactive audio and video platform and with the full consent and agreement of the patient (or guardian as applicable.) Patient physical location: [facility]. Telehealth provider physical location: home office in state of [place] .   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   SI  Assessment  Paul Hebert is a 53 y.o. male admitted: Presented to the EDfor 09/02/2024  6:25 PM for worsening suicidal ideation in the context of medication noncompliance, recent traumatic loss, and exacerbation of depression/anxiety symptoms. He carries the psychiatric diagnoses of PTSD, Major Depressive Disorder, and Generalized Anxiety Disorder, and has a past medical history significant for substance use disorders, disability, and extensive  incarceration history.  His current presentation of suicidal ideation, severe anxiety, and mood instability is most consistent with acute exacerbation of Major Depressive Disorder. He meets criteria for inpatient admission due to Major Depressive Disorder, recurrent, severe with suicidal ideation, based on persistent depressive symptoms, hopelessness, significant anxiety, SI, and functional decline.  Current outpatient psychotropic medications include Cymbalta , with previously partial but beneficial response. He was non-compliant with medications prior to admission, as evidenced by being off Cymbalta  for 5-6 weeks. On initial examination, the patient is anxious, dysphoric, cooperative, endorsing SI without plan, with paranoia and impaired judgment.  Diagnoses:  Active Hospital problems: Active Problems:   * No active hospital problems. *    Plan   ## Psychiatric Medication Recommendations:  Continue current regimen   ## Medical Decision Making Capacity: Not specifically addressed in this encounter  ## Disposition:-- We recommend inpatient psychiatric hospitalization when medically cleared. Patient is under voluntary admission status at this time; please IVC if attempts to leave hospital.  ## Behavioral / Environmental: - No specific recommendations at this time.     ## Safety and Observation Level:  - Based on my clinical evaluation, I estimate the patient to be at high risk of self harm in the current setting. - At this time, we recommend  routine. This decision is based on my review of the chart including patient's history and current presentation, interview of the patient, mental status examination, and consideration of suicide risk including evaluating suicidal ideation, plan, intent, suicidal or self-harm behaviors, risk factors, and protective factors.  This judgment is based on our ability to directly address suicide risk, implement suicide prevention strategies, and develop a safety  plan while the patient is in the clinical setting. Please contact our team if there is a concern that risk level has changed.  CSSR Risk Category:C-SSRS RISK CATEGORY: High Risk  Suicide Risk Assessment: Patient has following modifiable risk factors for suicide: active suicidal ideation, which we are addressing by inpatient admission. Patient has following non-modifiable or demographic risk factors for suicide: male gender Patient has the following protective factors against suicide: Access to outpatient mental health care  Thank you for this consult request. Recommendations have been communicated to the primary team.  We will recommend inpatient admission at this time.   Salihah Peckham, NP       History of Present Illness  Relevant Aspects of Hospital ED Course:  Admitted on 09/02/2024 for MDD recurrent severe.   Patient Report:  The patient is a 53 year old male presenting voluntarily for psychiatric evaluation due to worsening suicidal ideation over the past several days. He reports he has been off all psychiatric medications for 5-6 weeks after running out.  He states:  "My anxiety is through the roof."  "I'm in a bad place mentally."  "I keep thinking of ways to kill myself."  "My best friend just blew his brains out, and now I'm having the same thoughts."  He reports no suicide attempt today. He denies homicidal ideation and denies auditory or visual hallucinations. He endorses paranoia, stating: "The world is out to get me."  He reports significant trauma history, including:  Loss of his only child in 2010.  Spent 29 years in prison; currently has 5 weeks left of probation.  Long-standing diagnoses of PTSD, depression, and anxiety.  Substance use:  Alcohol daily,  Cocaine  occasionally,  Marijuana previously, but not currently due to parole restrictions.  He reports he is on disability and struggling emotionally. He identifies Cymbalta  as his only  psychiatric medication prior to running out.  Psych ROS:  Depression: yes  Anxiety:  yes Mania (lifetime and current): yes Psychosis: (lifetime and current): no  Review of Systems  Constitutional: Negative.   HENT: Negative.    Eyes: Negative.   Respiratory: Negative.    Cardiovascular: Negative.   Gastrointestinal: Negative.   Genitourinary: Negative.   Musculoskeletal: Negative.   Skin: Negative.   Neurological: Negative.   Psychiatric/Behavioral:  Positive for depression and suicidal ideas.      Psychiatric and Social History  Psychiatric History:  Information collected from chart history AND mother  Prev Dx/Sx: Depression Current Psych Provider: unknown Home Meds (current): unknown Previous Med Trials: none reported Therapy: none reported  Prior Psych Hospitalization: no   Prior Self Harm: no  Prior Violence: no  Family Psych History: no Family Hx suicide: no  Social History:  Developmental Hx: unknown Educational Hx: unknown Occupational Hx: unknown [employer] Hx: 29 years in prison, on probation 5 more weeks Living Situation: lives alone Spiritual Hx: not reported Access to weapons/lethal means: no   Substance History Alcohol: yes  Type of alcohol not reported Last Drink daily Number of drinks per day not reported History of alcohol withdrawal seizures not reported History of DT's none Tobacco: denies Illicit drugs: cocaine  sometimes Prescription drug abuse: denies Rehab hx: denies  Exam Findings   Vital Signs:  Temp:  [97.8 F (36.6 C)] 97.8 F (36.6 C) (11/18 1805) Pulse Rate:  [90] 90 (11/18 1805) Resp:  [17] 17 (11/18 1805) BP: (  138)/(103) 138/103 (11/18 1805) SpO2:  [100 %] 100 % (11/18 1805) Weight:  [65.8 kg] 65.8 kg (11/18 1809) Blood pressure (!) 138/103, pulse 90, temperature 97.8 F (36.6 C), temperature source Oral, resp. rate 17, height 5' 8 (1.727 m), weight 65.8 kg, SpO2 100%. Body mass index is 22.05 kg/m.  Physical  Exam HENT:     Head: Normocephalic.     Nose: Nose normal.  Pulmonary:     Effort: Pulmonary effort is normal.  Musculoskeletal:        General: Normal range of motion.     Cervical back: Normal range of motion.  Skin:    General: Skin is dry.  Neurological:     Mental Status: He is alert.      Other History   These have been pulled in through the EMR, reviewed, and updated if appropriate.  Family History:  The patient's family history includes Cancer in his father.  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[facility])     Surgical History: Past Surgical History:  Procedure Laterality Date   LARYNGOSCOPY Left 01/24/2022   Procedure: DIRECT LARYNGOSCOPY WITH TONSIL BIOPSY;  Surgeon: Herminio Miu, MD;  Location: [facility] ORS;  Service: ENT;  Laterality: Left;   PORT-A-CATH REMOVAL N/A 02/04/2021   Procedure: REMOVAL PORT-A-CATH;  Surgeon: Desiderio Schanz, MD;  Location: [facility] ORS;  Service: General;  Laterality: N/A;   PORTA CATH INSERTION N/A 10/25/2020   Procedure: PORTA CATH INSERTION;  Surgeon: Marea Selinda RAMAN, MD;  Location: [facility] INVASIVE CV LAB;  Service: Cardiovascular;  Laterality: N/A;   PORTA CATH INSERTION N/A 02/09/2022   Procedure: PORTA CATH INSERTION;  Surgeon: Marea Selinda RAMAN, MD;  Location: [facility] CV [facility];  Service: Cardiovascular;  Laterality: N/A;   REMOVAL OF GASTROSTOMY TUBE N/A 02/04/2021   Procedure: REMOVAL OF GASTROSTOMY TUBE;  Surgeon: Desiderio Schanz, MD;  Location: [facility] ORS;  Service: General;  Laterality: N/A;   TONGUE BIOPSY Left 01/24/2022   Procedure: TONGUE BIOPSY;  Surgeon: Herminio Miu, MD;  Location: [facility] ORS;  Service: ENT;  Laterality: Left;     Medications:   Current Facility-Administered Medications:    DULoxetine  (CYMBALTA ) DR capsule 60 mg, 60 mg, Oral, Daily, Ward, Kristen N, DO   gabapentin  (NEURONTIN ) capsule 400 mg, 400 mg, Oral, BID,  Ward, Kristen N, DO, 400 mg at 09/03/24 0124   hydrOXYzine  (ATARAX ) tablet 50 mg, 50 mg, Oral, Daily PRN, Ward, Kristen N, DO   traZODone  (DESYREL ) tablet 50 mg, 50 mg, Oral, QHS, Ward, Kristen N, DO, 50 mg at 09/03/24 0124  Current Outpatient Medications:    nystatin  (MYCOSTATIN /NYSTOP ) powder, Apply 1 Application topically 3 (three) times daily., Disp: , Rfl:    DULoxetine  (CYMBALTA ) 60 MG capsule, Take 1 capsule (60 mg total) by mouth daily. (Patient not taking: Reported on 09/02/2024), Disp: 30 capsule, Rfl: 0   gabapentin  (NEURONTIN ) 400 MG capsule, Take 1 capsule (400 mg total) by mouth 2 (two) times daily. (Patient not taking: Reported on 09/02/2024), Disp: 60 capsule, Rfl: 0   hydrOXYzine  (ATARAX ) 50 MG tablet, Take 1 tablet (50 mg total) by mouth daily as needed for anxiety. (Patient not taking: Reported on 09/02/2024), Disp: 30 tablet, Rfl: 0   traZODone  (DESYREL ) 50 MG tablet, Take 1 tablet (50 mg total) by mouth at bedtime. (Patient not taking: Reported on 09/02/2024), Disp: 30 tablet, Rfl: 0  Facility-Administered Medications Ordered in  Other Encounters:    heparin  lock flush 100 UNIT/ML injection, , , ,    heparin  lock flush 100 UNIT/ML injection, , , ,    sodium chloride  flush (NS) 0.9 % injection 10 mL, 10 mL, Intravenous, PRN, Melanee Annah BROCKS, MD, 10 mL at 04/05/22 1112  Allergies: No Known Allergies  Clemens Lachman, NP

## 2024-09-03 NOTE — ED Notes (Signed)
 Pt provided with breakfast tray no other needs at this time

## 2024-09-03 NOTE — BH Assessment (Signed)
 Patient is to be admitted to [facility] on today 09/03/24 by Dr. Jadapalle.  Attending Physician will be Dr. Jadapalle.   Patient has been assigned to room 301, by [facility] Charge Nurse Leita.    ER staff is aware of the admission: Olam, ER Secretary   Dr. Dicky, ER MD  Leonor, Patient's Nurse  Curtistine, Patient Access.

## 2024-09-03 NOTE — ED Notes (Signed)
 Meal tray provided. Tray checked for potential hazards. Pt denies no other needs at this time.

## 2024-09-03 NOTE — ED Notes (Signed)
 Pt given water

## 2024-09-03 NOTE — Progress Notes (Signed)
   09/03/24 2100  Psych Admission Type (Psych Patients Only)  Admission Status Voluntary  Psychosocial Assessment  Patient Complaints None  Eye Contact Fair  Facial Expression Other (Comment) (WDL)  Affect Flat  Speech Soft  Interaction Minimal (Visible in the milieu)  Motor Activity Other (Comment) (WDL)  Appearance/Hygiene Unremarkable  Behavior Characteristics Appropriate to situation;Cooperative  Mood Anxious;Depressed  Aggressive Behavior  Effect No apparent injury  Thought Process  Coherency WDL  Content WDL  Delusions None reported or observed  Perception WDL  Hallucination None reported or observed  Judgment Limited  Confusion None  Danger to Self  Current suicidal ideation? Denies  Agreement Not to Harm Self Yes  Description of Agreement Verbal  Danger to Others  Danger to Others None reported or observed

## 2024-09-03 NOTE — ED Notes (Signed)
Vol /psych consult ordered/ pending  

## 2024-09-04 DIAGNOSIS — F329 Major depressive disorder, single episode, unspecified: Secondary | ICD-10-CM | POA: Diagnosis not present

## 2024-09-04 DIAGNOSIS — R21 Rash and other nonspecific skin eruption: Secondary | ICD-10-CM | POA: Diagnosis not present

## 2024-09-04 MED ORDER — HYDROCORTISONE 1 % EX CREA
TOPICAL_CREAM | Freq: Three times a day (TID) | CUTANEOUS | Status: DC
  Filled 2024-09-04: qty 28

## 2024-09-04 MED ORDER — INFLUENZA VIRUS VACC SPLIT PF (FLUZONE) 0.5 ML IM SUSY
0.5000 mL | PREFILLED_SYRINGE | INTRAMUSCULAR | Status: AC
Start: 2024-09-05 — End: 2024-09-08
  Administered 2024-09-08: 0.5 mL via INTRAMUSCULAR
  Filled 2024-09-04: qty 0.5

## 2024-09-04 MED ORDER — PNEUMOCOCCAL 20-VAL CONJ VACC 0.5 ML IM SUSY
0.5000 mL | PREFILLED_SYRINGE | INTRAMUSCULAR | Status: AC
Start: 2024-09-05 — End: 2024-09-08
  Administered 2024-09-08: 0.5 mL via INTRAMUSCULAR
  Filled 2024-09-04: qty 0.5

## 2024-09-04 MED ORDER — PREDNISONE 10 MG PO TABS
20.0000 mg | ORAL_TABLET | Freq: Once | ORAL | Status: AC
  Administered 2024-09-04: 20 mg via ORAL
  Filled 2024-09-04: qty 2

## 2024-09-04 NOTE — Group Note (Signed)
 Recreation Therapy Group Note   Group Topic:Stress Management  Group Date: 09/04/2024 Start Time: 1000 End Time: 1040 Facilitators: Celestia Jeoffrey BRAVO, LRT, CTRS Location: Dayroom  Group Description: PMR (Progressive Muscle Relaxation). LRT educates patients on what PMR is and the benefits that come from it. Patients are asked to sit with their feet flat on the floor while sitting up and all the way back in their chair, if possible. LRT and pts follow a prompt through a speaker that requires you to tense and release different muscles in their body and focus on their breathing. During session, lights are off and soft music is being played. Pts are given a stress ball to use if needed.   Goal Area(s) Addressed:  Patients will be able to describe progressive muscle relaxation.  Patient will practice using relaxation technique. Patient will identify a new coping skill.  Patient will follow multistep directions to reduce anxiety and stress.   Affect/Mood: N/A   Participation Level: Did not attend    Clinical Observations/Individualized Feedback: Patient did not attend group.   Plan: Continue to engage patient in RT group sessions 2-3x/week.   Jeoffrey BRAVO Celestia, LRT, CTRS 09/04/2024 11:39 AM
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
## 2024-09-04 NOTE — Group Note (Signed)
 Recreation Therapy Group Note   Group Topic:General Recreation  Group Date: 09/04/2024 Start Time: 1500 End Time: 1555 Facilitators: Celestia Jeoffrey BRAVO, LRT, CTRS Location: Courtyard  Group Description: [employer]. LRT and patients played games of basketball, drew with chalk, and played corn hole while outside in the courtyard while getting fresh air and sunlight. Music was being played in the background. LRT and peers conversed about different games they have played before, what they do in their free time and anything else that is on their minds. LRT encouraged pts to drink water  after being outside, sweating and getting their heart rate up.  Goal Area(s) Addressed: Patient will build on frustration tolerance skills. Patients will partake in a competitive play game with peers. Patients will gain knowledge of new leisure interest/hobby.    Affect/Mood: Appropriate   Participation Level: Engaged   Participation Quality: Independent   Behavior: Cooperative   Speech/Thought Process: Coherent   Insight: Fair   Judgement: Fair    Modes of Intervention: Activity   Patient Response to Interventions:  Receptive   Education Outcome:  Acknowledges education   Clinical Observations/Individualized Feedback: Paul Hebert was active in their participation of session activities and group discussion. Pt interacted well with LRT and peers duration of session.    Plan: Continue to engage patient in RT group sessions 2-3x/week.   Jeoffrey BRAVO Celestia, LRT, CTRS 09/04/2024 4:57 PM

## 2024-09-04 NOTE — Progress Notes (Signed)
   09/04/24 0815  Psych Admission Type (Psych Patients Only)  Admission Status Voluntary  Psychosocial Assessment  Patient Complaints None  Eye Contact Fair  Facial Expression Sullen  Affect Flat  Speech Soft  Interaction Minimal  Motor Activity Other (Comment) (WDL)  Appearance/Hygiene Unremarkable  Behavior Characteristics Appropriate to situation  Mood Depressed;Anxious  Aggressive Behavior  Effect No apparent injury  Thought Process  Coherency WDL  Content WDL  Delusions None reported or observed  Perception WDL  Hallucination None reported or observed  Judgment Limited  Confusion None  Danger to Self  Current suicidal ideation? Denies  Agreement Not to Harm Self Yes  Description of Agreement Verbal  Danger to Others  Danger to Others None reported or observed

## 2024-09-04 NOTE — [facility] Suicide Risk Assessment (Signed)
 [facility] INPATIENT:  Family/Significant Other Suicide Prevention Education  Suicide Prevention Education:  Patient Refusal for Family/Significant Other Suicide Prevention Education: The patient JAICOB DIA has refused to provide written consent for family/significant other to be provided Family/Significant Other Suicide Prevention Education during admission and/or prior to discharge.  Physician notified.  SPE completed with pt, as pt refused to consent to family contact. SPI pamphlet provided to pt and pt was encouraged to share information with support network, ask questions, and talk about any concerns relating to SPE. Pt denies access to guns/firearms and verbalized understanding of information provided. Mobile Crisis information also provided to pt.   Sherryle JINNY Margo 09/04/2024, 10:31 AM

## 2024-09-04 NOTE — Plan of Care (Signed)

## 2024-09-04 NOTE — Progress Notes (Signed)
 Pt frustrated and has increasing anxiety because he is having to wait for hospitalist to evaluate his extensive rash/hives surrounding his stoma  Onset followed tape being applied to his neck around stoma.  The provider has ordered a consult for hospitalist to evaluate.  Pt removed the large bandaid that was over the stoma and his skin is just as inflammed where the bandaid was adhered to his skin directly around stoma.

## 2024-09-04 NOTE — Group Note (Signed)
 LCSW Group Therapy Note  Group Date: 09/04/2024 Start Time: 1300 End Time: 1400   Type of Therapy and Topic:  Group Therapy: Using I Statements  Participation Level:  Active  Description of Group:  Patients were asked to provide details of some interpersonal conflicts they have experienced. Patients were then educated about "I" statements, communication which focuses on feelings or views of the speaker rather than what the other person is doing. T group members were asked to reflect on past conflicts and to provide specific examples for utilizing "I" statements.  Therapeutic Goals:  Patients will verbalize understanding of ineffective communication and effective communication. Patients will be able to empathize with whom they are having conflict. Patients will practice effective communication in the form of "I" statements.    Summary of Patient Progress:  Patient shared their personal feelings about their current plans and endeavors. The patient was present and active throughout the session and proved open to feedback from CSW and peers. Patient demonstrated proficient insight into the subject matter, was respectful of peers, and was present throughout the entire session.  Therapeutic Modalities:   Cognitive Behavioral Therapy Solution-Focused Therapy    Alveta CHRISTELLA Kerns, LCSW 09/04/2024  2:05 PM
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
## 2024-09-04 NOTE — Plan of Care (Signed)
   Problem: Education: Goal: Knowledge of Paul Hebert General Education information/materials will improve Outcome: Progressing Goal: Emotional status will improve Outcome: Progressing Goal: Mental status will improve Outcome: Progressing Goal: Verbalization of understanding the information provided will improve Outcome: Progressing   Problem: Activity: Goal: Interest or engagement in activities will improve Outcome: Progressing Goal: Sleeping patterns will improve Outcome: Progressing   Problem: Coping: Goal: Ability to verbalize frustrations and anger appropriately will improve Outcome: Progressing Goal: Ability to demonstrate self-control will improve Outcome: Progressing

## 2024-09-04 NOTE — Group Note (Signed)
 Date:  09/04/2024 Time:  8:42 PM  Group Topic/Focus:  Orientation:   The focus of this group is to educate the patient on the purpose and policies of crisis stabilization and provide a format to answer questions about their admission.  The group details unit policies and expectations of patients while admitted. Self Care:   The focus of this group is to help patients understand the importance of self-care in order to improve or restore emotional, physical, spiritual, interpersonal, and financial health.    Participation Level:  Active  Participation Quality:  Appropriate and Attentive  Affect:  Appropriate  Cognitive:  Alert and Appropriate  Insight: Appropriate, Good, and Improving  Engagement in Group:  Developing/Improving and Engaged  Modes of Intervention:  Clarification, Discussion, Education, Orientation, Rapport Building, and Support  Additional Comments:     Amenda Duclos 09/04/2024, 8:42 PM

## 2024-09-04 NOTE — Consult Note (Signed)
 Initial Consultation Note   Patient: Paul Hebert FMW:[id number] DOB: 15-Jan-1971 PCP: Pcp, No DOA: 09/03/2024 DOS: the patient was seen and examined on 09/04/2024 Primary service: Donnelly Mellow, MD  Referring physician: Dr. Donnelly Reason for consult: neck skin rashes   Assessment and Plan: Skin rash in the neck. Status post tracheostomy. Patient to develop macular rashes in the neck around the tracheostomy site.  This happened after patient was placed on a tape, which has since removed.  The rash is very itching.  This appeared to be secondary to allergic reaction. Will give a dose of prednisone , also added hydrocortisone ointment. Anticipated recovery quickly, will follow-up 1 more time tomorrow.  Suicidal ideation. Managed by psychiatry.    HPI: Paul Hebert is a 53 y.o. male with past medical history of tonsil cancer, status post tracheostomy, gastroesophageal reflux disease.  He was admitted to psychiatry for suicidal ideation.  He normally had to close the tracheostomy opening for eating.  This time, a tape was placed.  After removing the tape, patient was found to have a rash around the site, with significant itching. Patient does not have any shortness of breath, no cough.  No fever or chills.  Review of Systems: As mentioned in the history of present illness. All other systems reviewed and are negative. Past Medical History:  Diagnosis Date   Cancer associated pain    GERD (gastroesophageal reflux disease)    Tonsil cancer [facility])    Past Surgical History:  Procedure Laterality Date   LARYNGOSCOPY Left 01/24/2022   Procedure: DIRECT LARYNGOSCOPY WITH TONSIL BIOPSY;  Surgeon: Herminio Miu, MD;  Location: [facility] ORS;  Service: ENT;  Laterality: Left;   PORT-A-CATH REMOVAL N/A 02/04/2021   Procedure: REMOVAL PORT-A-CATH;  Surgeon: Desiderio Schanz, MD;  Location: [facility] ORS;  Service: General;  Laterality: N/A;   PORTA CATH INSERTION N/A 10/25/2020   Procedure: PORTA CATH  INSERTION;  Surgeon: Marea Selinda RAMAN, MD;  Location: [facility] INVASIVE CV LAB;  Service: Cardiovascular;  Laterality: N/A;   PORTA CATH INSERTION N/A 02/09/2022   Procedure: PORTA CATH INSERTION;  Surgeon: Marea Selinda RAMAN, MD;  Location: [facility] INVASIVE CV LAB;  Service: Cardiovascular;  Laterality: N/A;   REMOVAL OF GASTROSTOMY TUBE N/A 02/04/2021   Procedure: REMOVAL OF GASTROSTOMY TUBE;  Surgeon: Desiderio Schanz, MD;  Location: [facility] ORS;  Service: General;  Laterality: N/A;   TONGUE BIOPSY Left 01/24/2022   Procedure: TONGUE BIOPSY;  Surgeon: Herminio Miu, MD;  Location: [facility] ORS;  Service: ENT;  Laterality: Left;   Social History:  reports that he has been smoking cigarettes. He has a 17.5 pack-year smoking history. He has quit using smokeless tobacco. He reports current alcohol use. He reports current drug use. Drugs: Marijuana, Crack cocaine , and Methamphetamines.  No Known Allergies  Family History  Problem Relation Age of Onset   Cancer Father     Prior to Admission medications   Medication Sig Start Date End Date Taking? Authorizing Provider  DULoxetine  (CYMBALTA ) 60 MG capsule Take 1 capsule (60 mg total) by mouth daily. Patient not taking: Reported on 09/02/2024 06/26/24   Millington, Matthew E, PA-C  gabapentin  (NEURONTIN ) 400 MG capsule Take 1 capsule (400 mg total) by mouth 2 (two) times daily. Patient not taking: Reported on 09/02/2024 06/25/24   Millington, Matthew E, PA-C  hydrOXYzine  (ATARAX ) 50 MG tablet Take 1 tablet (50 mg total) by mouth daily as needed for anxiety. Patient not taking: Reported on 09/02/2024 06/25/24   Millington, Matthew E, PA-C  nystatin  (MYCOSTATIN /NYSTOP ) powder Apply 1 Application topically 3 (three) times daily. 07/09/24   [provider]  traZODone  (DESYREL ) 50 MG tablet Take 1 tablet (50 mg total) by mouth at bedtime. Patient not taking: Reported on 09/02/2024 06/25/24   Debbie Donnice BRAVO, PA-C    Physical Exam: Vitals:   09/03/24 1800  09/04/24 0614  BP: 115/84 110/74  Pulse: 74 69  Resp: 16 20  Temp: 97.9 F (36.6 C) 98.4 F (36.9 C)  TempSrc: Oral Oral  SpO2: 100% 97%  Weight: 64.4 kg   Height: 5' 8 (1.727 m)    Physical Exam Constitutional:      General: He is not in acute distress.    Appearance: Normal appearance. He is not ill-appearing or toxic-appearing.  HENT:     Head: Normocephalic and atraumatic.     Nose: Nose normal. No congestion.     Mouth/Throat:     Mouth: Mucous membranes are moist.     Pharynx: Oropharynx is clear.  Eyes:     Extraocular Movements: Extraocular movements intact.     Conjunctiva/sclera: Conjunctivae normal.     Pupils: Pupils are equal, round, and reactive to light.  Neck:     Comments: Post tracheostomy Cardiovascular:     Rate and Rhythm: Normal rate and regular rhythm.     Heart sounds: No murmur heard.    No friction rub. No gallop.  Pulmonary:     Effort: Pulmonary effort is normal.     Breath sounds: Normal breath sounds. No wheezing or rales.  Abdominal:     General: Abdomen is flat. Bowel sounds are normal. There is no distension.     Palpations: Abdomen is soft.     Tenderness: There is no abdominal tenderness.  Musculoskeletal:        General: No swelling or tenderness. Normal range of motion.     Cervical back: Normal range of motion. No rigidity or tenderness.  Lymphadenopathy:     Cervical: No cervical adenopathy.  Skin:    General: Skin is warm and dry.     Coloration: Skin is not jaundiced.  Neurological:     General: No focal deficit present.     Mental Status: He is alert and oriented to person, place, and time.     Cranial Nerves: No cranial nerve deficit.  Psychiatric:     Comments: Flat affect     Data Reviewed:   Lab results reviewed.   Family Communication: None Primary team communication: D/w Dr. Jadappale Thank you very much for involving us  in the care of your patient.  Author: Murvin Mana, MD 09/04/2024 11:34 AM  For  on call review [email or web address].

## 2024-09-05 DIAGNOSIS — L509 Urticaria, unspecified: Secondary | ICD-10-CM

## 2024-09-05 MED ORDER — ADULT MULTIVITAMIN W/MINERALS CH
1.0000 | ORAL_TABLET | Freq: Every day | ORAL | Status: DC
  Administered 2024-09-05 – 2024-09-08 (×4): 1 via ORAL
  Filled 2024-09-05 (×4): qty 1

## 2024-09-05 MED ORDER — FOLIC ACID 1 MG PO TABS
1.0000 mg | ORAL_TABLET | Freq: Every day | ORAL | Status: DC
  Administered 2024-09-05 – 2024-09-08 (×4): 1 mg via ORAL
  Filled 2024-09-05 (×4): qty 1

## 2024-09-05 MED ORDER — GABAPENTIN 300 MG PO CAPS
300.0000 mg | ORAL_CAPSULE | Freq: Three times a day (TID) | ORAL | Status: DC
  Administered 2024-09-05 – 2024-09-07 (×6): 300 mg via ORAL
  Filled 2024-09-05 (×6): qty 1

## 2024-09-05 MED ORDER — TRAZODONE HCL 100 MG PO TABS
100.0000 mg | ORAL_TABLET | Freq: Every day | ORAL | Status: DC
  Administered 2024-09-05 – 2024-09-07 (×3): 100 mg via ORAL
  Filled 2024-09-05 (×3): qty 1

## 2024-09-05 MED ORDER — ENSURE PLUS HIGH PROTEIN PO LIQD
237.0000 mL | Freq: Three times a day (TID) | ORAL | Status: DC
  Administered 2024-09-05 – 2024-09-08 (×6): 237 mL via ORAL

## 2024-09-05 MED ORDER — GABAPENTIN 100 MG PO CAPS
200.0000 mg | ORAL_CAPSULE | Freq: Once | ORAL | Status: AC
  Administered 2024-09-05: 200 mg via ORAL
  Filled 2024-09-05: qty 2

## 2024-09-05 MED ORDER — THIAMINE MONONITRATE 100 MG PO TABS
100.0000 mg | ORAL_TABLET | Freq: Every day | ORAL | Status: DC
  Administered 2024-09-05 – 2024-09-08 (×4): 100 mg via ORAL
  Filled 2024-09-05 (×4): qty 1

## 2024-09-05 NOTE — Group Note (Signed)
 Date:  09/05/2024 Time:  10:35 AM  Group Topic/Focus:  Dimensions of Wellness:   The focus of this group is to introduce the topic of wellness and discuss the role each dimension of wellness plays in total health.    Participation Level:  Active  Participation Quality:  Appropriate  Affect:  Appropriate  Cognitive:  Alert  Insight: Appropriate  Engagement in Group:  Engaged  Modes of Intervention:  Activity, Discussion, and Education  Additional Comments:    Skippy LITTIE Bennett 09/05/2024, 10:35 AM

## 2024-09-05 NOTE — Group Note (Signed)
 Date:  09/05/2024 Time:  8:36 PM  Group Topic/Focus:  Wrap-Up Group:   The focus of this group is to help patients review their daily goal of treatment and discuss progress on daily workbooks.    Participation Level:  Active  Participation Quality:  Appropriate, Attentive, and Sharing  Affect:  Appropriate  Cognitive:  Appropriate  Insight: Appropriate  Engagement in Group:  Engaged  Modes of Intervention:  Discussion  Additional Comments:     Kerri Katz 09/05/2024, 8:36 PM

## 2024-09-05 NOTE — Progress Notes (Signed)
 [facility] MD Progress Note  09/05/2024 9:22 AM Paul Hebert  MRN:  [id number]  Paul Hebert is a 53 y.o. male who presents today with concern of psychiatric evaluation.  Has been having suicidal ideations over the last few days, recently ran out of his home psychiatric medications.  He tells me that his best friend recently committed suicide, and now he is also having similar thoughts.  Denies any attempts today.  Denies any other medical complaints although he does have underlying history of cancer that is now in remission.  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   Subjective:  Chart reviewed, case discussed in multidisciplinary meeting, patient seen during rounds.   On exam today patient is found in room.  They are alert and oriented.  They are pleasant and cooperative.  He reports he is feeling better today.  He indicates he is having mouth pain secondary to radiation burns which has previously responded to gabapentin  400 mg 3 times daily.  Discussed had been off of for a while we will start at 300 mg tid.  He was agreeable to nutrition and SLP consult.  The rash around his trach is improving.  He denies SI, HI, and AVH.  He discusses some guilt over the best friend committing suicide he feels like if he had loaned him some money that might of stopped him from doing that.  Discussed processing guilt and grief.  Discussed coping skills.  Encourage patient to continue participating in group sessions.  He is open to enrolling in therapy.  He is open to following up with [facility].  Will increase trazodone .  He is also interested in following up with AA.   Past Psychiatric History: see h&P Family History:  Family History  Problem Relation Age of Onset   Cancer Father    Social History:  Social History   Substance and Sexual Activity  Alcohol Use Yes   Comment: Patient reports 1 pint to 1/5th daily on  09/03/24     Social History   Substance and Sexual Activity  Drug Use Yes   Types: Marijuana, Crack cocaine , Methamphetamines   Comment: marijuana every day except the joint    Social History   Socioeconomic History   Marital status: Single    Spouse name: Not on file   Number of children: Not on file   Years of education: Not on file   Highest education level: Not on file  Occupational History   Not on file  Tobacco Use   Smoking status: Every Day    Current packs/day: 0.50    Average packs/day: 0.5 packs/day for 35.0 years (17.5 ttl pk-yrs)    Types: Cigarettes   Smokeless tobacco: Former   Tobacco comments:    Patches very rarely  Vaping Use   Vaping status: Former  Substance and Sexual Activity   Alcohol use: Yes    Comment: Patient reports 1 pint to 1/5th daily on 09/03/24   Drug use: Yes    Types: Marijuana, Crack cocaine , Methamphetamines    Comment: marijuana every day except the joint   Sexual activity: Not Currently  Other Topics Concern   Not on file  Social History Narrative   Not on file   Social Drivers of Health   Financial Resource Strain: Medium Risk (06/13/2022)   Received from [facility]   Overall Financial Resource Strain (CARDIA)  Difficulty of Paying Living Expenses: Somewhat hard  Food Insecurity: Food Insecurity Present (09/03/2024)   Hunger Vital Sign    Worried About Running Out of Food in the Last Year: Sometimes true    Ran Out of Food in the Last Year: Sometimes true  Transportation Needs: No Transportation Needs (09/03/2024)   PRAPARE - Administrator, Civil Service (Medical): No    Lack of Transportation (Non-Medical): No  Recent Concern: Transportation Needs - Unmet Transportation Needs (06/18/2024)   PRAPARE - Administrator, Civil Service (Medical): Yes    Lack of Transportation (Non-Medical): Yes  Physical Activity: Not on file  Stress: Not on file  Social Connections: Not on file   Past  Medical History:  Past Medical History:  Diagnosis Date   Cancer associated pain    GERD (gastroesophageal reflux disease)    Tonsil cancer (HCC)     Past Surgical History:  Procedure Laterality Date   LARYNGOSCOPY Left 01/24/2022   Procedure: DIRECT LARYNGOSCOPY WITH TONSIL BIOPSY;  Surgeon: Herminio Miu, MD;  Location: [facility] ORS;  Service: ENT;  Laterality: Left;   PORT-A-CATH REMOVAL N/A 02/04/2021   Procedure: REMOVAL PORT-A-CATH;  Surgeon: Desiderio Schanz, MD;  Location: [facility] OR[facility];  Service: General;  Laterality: N/A;   PORTA CATH INSERTION N/A 10/25/2020   Procedure: PORTA CATH INSERTION;  Surgeon: Marea Selinda RAMAN, MD;  Location: [facility];  Service: Cardiovascular;  Laterality: N/A;   PORTA CATH INSERTION N/A 02/09/2022   Procedure: PORTA CATH INSERTION;  Surgeon: Marea Selinda RAMAN, MD;  Location: [facility];  Service: Cardiovascular;  Laterality: N/A;   REMOVAL OF GASTROSTOMY TUBE N/A 02/04/2021   Procedure: REMOVAL OF GASTROSTOMY TUBE;  Surgeon: Desiderio Schanz, MD;  Location: [facility] OR[facility];  Service: General;  Laterality: N/A;   TONGUE BIOPSY Left 01/24/2022   Procedure: TONGUE BIOPSY;  Surgeon: Herminio Miu, MD;  Location: [facility] OR[facility];  Service: ENT;  Laterality: Left;    Current Medications: Current Facility-Administered Medications  Medication Dose Route Frequency Provider Last Rate Last Admin   acetaminophen  (TYLENOL ) tablet 650 mg  650 mg Oral Q6H PRN Smith, Annie B, NP       alum & mag hydroxide-simeth (MAALOX/MYLANTA) 200-200-20 MG/5ML suspension 30 mL  30 mL Oral Q4H PRN Smith, Annie B, NP       haloperidol  (HALDOL ) tablet 5 mg  5 mg Oral TID PRN Smith, Annie B, NP       And   diphenhydrAMINE  (BENADRYL ) capsule 50 mg  50 mg Oral TID PRN Smith, Annie B, NP   50 mg at 09/03/24 2311   haloperidol  lactate (HALDOL ) injection 5 mg  5 mg Intramuscular TID PRN Smith, Annie B, NP       And   diphenhydrAMINE  (BENADRYL ) injection 50 mg  50 mg Intramuscular TID PRN Smith,  Annie B, NP       And   LORazepam  (ATIVAN ) injection 2 mg  2 mg Intramuscular TID PRN Smith, Annie B, NP       haloperidol  lactate (HALDOL ) injection 10 mg  10 mg Intramuscular TID PRN Smith, Annie B, NP       And   diphenhydrAMINE  (BENADRYL ) injection 50 mg  50 mg Intramuscular TID PRN Smith, Annie B, NP       And   LORazepam  (ATIVAN ) injection 2 mg  2 mg Intramuscular TID PRN Smith, Annie B, NP       DULoxetine  (CYMBALTA ) DR capsule 60 mg  60 mg Oral Daily Smith, Annie B, NP   60 mg at 09/05/24 9173   gabapentin  (NEURONTIN ) capsule 300 mg  300 mg Oral TID Ermal Haberer E, PA-C       hydrocortisone  cream 1 %   Topical TID Zhang, Dekui, MD   Given at 09/05/24 9173   hydrOXYzine  (ATARAX ) tablet 50 mg  50 mg Oral Daily PRN Smith, Annie B, NP       influenza vac split trivalent PF (FLUZONE ) injection 0.5 mL  0.5 mL Intramuscular Tomorrow-1000 Donnelly Mellow, MD       magnesium  hydroxide (MILK OF MAGNESIA) suspension 30 mL  30 mL Oral Daily PRN Smith, Annie B, NP       nicotine  (NICODERM CQ  - dosed in mg/24 hours) patch 21 mg  21 mg Transdermal Daily Jadapalle, Sree, MD   21 mg at 09/05/24 9171   pneumococcal 20-valent conjugate vaccine (PREVNAR 20 ) injection 0.5 mL  0.5 mL Intramuscular Tomorrow-1000 Donnelly Mellow, MD       traZODone  (DESYREL ) tablet 50 mg  50 mg Oral QHS Smith, Annie B, NP   50 mg at 09/04/24 2110   Facility-Administered Medications Ordered in Other Encounters  Medication Dose Route Frequency Provider Last Rate Last Admin   heparin  lock flush 100 UNIT/ML injection            heparin  lock flush 100 UNIT/ML injection            sodium chloride  flush (NS) 0.9 % injection 10 mL  10 mL Intravenous PRN Rao, Archana C, MD   10 mL at 04/05/22 1112    Lab Results: No results found for this or any previous visit (from the past 48 hours).  Blood Alcohol level:  Lab Results  Component Value Date   [facility] <15 09/02/2024   ETH <15 06/17/2024    Metabolic Disorder Labs: Lab  Results  Component Value Date   HGBA1C 5.6 06/19/2024   MPG 114 06/19/2024   MPG 108.28 10/05/2021   No results found for: PROLACTIN Lab Results  Component Value Date   CHOL 168 06/19/2024   TRIG 62 06/19/2024   HDL 58 06/19/2024   CHOLHDL 2.9 06/19/2024   VLDL 12 06/19/2024   LDLCALC 98 06/19/2024   LDLCALC 133 (H) 10/05/2021    Physical Findings: AIMS:  , ,  ,  ,    CIWA:    COWS:      Psychiatric Specialty Exam:  Presentation  General Appearance:  Appropriate for Environment  Eye Contact: Fair  Speech: Clear and Coherent  Speech Volume: Normal    Mood and Affect  Mood: Depressed  Affect: Congruent   Thought Process  Thought Processes: Coherent; Linear  Orientation:Full (Time, Place and Person)  Thought Content:WDL  Hallucinations:Hallucinations: None  Ideas of Reference:None  Suicidal Thoughts:Suicidal Thoughts: Yes, Active SI Active Intent and/or Plan: Without Intent; Without Plan  Homicidal Thoughts:Homicidal Thoughts: No   Sensorium  Memory: Immediate Fair; Recent Fair  Judgment: Poor  Insight: Fair   Chartered Certified Accountant: Fair  Attention Span: Fair  Recall: [employer] of Knowledge: Fair  Language: Fair   Psychomotor Activity  Psychomotor Activity: Psychomotor Activity: Normal  Musculoskeletal: Strength & Muscle Tone: within normal limits Gait & Station: normal Assets  Assets: Manufacturing Systems Engineer; Desire for Improvement    Physical Exam: Physical Exam Vitals and nursing note reviewed.  HENT:     Head: Atraumatic.  Eyes:     Extraocular Movements: Extraocular movements intact.  Pulmonary:     Effort: Pulmonary effort is normal.  Neurological:     Mental Status: He is alert and oriented to person, place, and time.  Psychiatric:        Behavior: Behavior normal.    Review of Systems  Psychiatric/Behavioral:  Positive for depression. Negative for hallucinations, substance  abuse and suicidal ideas. The patient is nervous/anxious and has insomnia.    Blood pressure (!) 110/96, pulse 70, temperature 98.4 F (36.9 C), temperature source Oral, resp. rate 20, height 5' 8 (1.727 m), weight 64.4 kg, SpO2 98%. Body mass index is 21.59 kg/m.  Diagnosis: Principal Problem:   MDD (major depressive disorder)   PLAN: Safety and Monitoring:  -- Voluntary admission to inpatient psychiatric unit for safety, stabilization and treatment  -- Daily contact with patient to assess and evaluate symptoms and progress in treatment  -- Patient's case to be discussed in multi-disciplinary team meeting  -- Observation Level : q15 minute checks  -- Vital signs:  q12 hours  -- Precautions: suicide, elopement, and assault -- Encouraged patient to participate in unit milieu and in scheduled group therapies  2. Psychiatric Treatment:  Scheduled Medications:  Continue Cymbalta  60 mg daily.  Increase gabapentin  to 300 mg 3 times daily  Increase trazodone  to 100 mg nightly    -- The risks/benefits/side-effects/alternatives to this medication were discussed in detail with the patient and time was given for questions. The patient consents to medication trial.  3. Medical Issues Being Addressed:   SLP and nutrition consult no acute concerns.  4. Discharge Planning:   -- Social work and case management to assist with discharge planning and identification of hospital follow-up needs prior to discharge  -- Estimated LOS: 3-4 days  Donnice FORBES Right, PA-C 09/05/2024, 9:22 AM

## 2024-09-05 NOTE — BH IP Treatment Plan (Signed)
 Interdisciplinary Treatment and Diagnostic Plan Update  09/05/2024 Time of Session: 1:38 PM Paul Hebert MRN: [id number]  Principal Diagnosis: MDD (major depressive disorder)  Secondary Diagnoses: Principal Problem:   MDD (major depressive disorder)   Current Medications:  Current Facility-Administered Medications  Medication Dose Route Frequency Provider Last Rate Last Admin   acetaminophen  (TYLENOL ) tablet 650 mg  650 mg Oral Q6H PRN Smith, Annie B, NP       alum & mag hydroxide-simeth (MAALOX/MYLANTA) 200-200-20 MG/5ML suspension 30 mL  30 mL Oral Q4H PRN Smith, Annie B, NP       haloperidol  (HALDOL ) tablet 5 mg  5 mg Oral TID PRN Smith, Annie B, NP       And   diphenhydrAMINE  (BENADRYL ) capsule 50 mg  50 mg Oral TID PRN Smith, Annie B, NP   50 mg at 09/03/24 2311   haloperidol  lactate (HALDOL ) injection 5 mg  5 mg Intramuscular TID PRN Smith, Annie B, NP       And   diphenhydrAMINE  (BENADRYL ) injection 50 mg  50 mg Intramuscular TID PRN Smith, Annie B, NP       And   LORazepam  (ATIVAN ) injection 2 mg  2 mg Intramuscular TID PRN Smith, Annie B, NP       haloperidol  lactate (HALDOL ) injection 10 mg  10 mg Intramuscular TID PRN Smith, Annie B, NP       And   diphenhydrAMINE  (BENADRYL ) injection 50 mg  50 mg Intramuscular TID PRN Smith, Annie B, NP       And   LORazepam  (ATIVAN ) injection 2 mg  2 mg Intramuscular TID PRN Smith, Annie B, NP       DULoxetine  (CYMBALTA ) DR capsule 60 mg  60 mg Oral Daily Smith, Annie B, NP   60 mg at 09/05/24 9173   gabapentin  (NEURONTIN ) capsule 300 mg  300 mg Oral TID Millington, Matthew E, PA-C   300 mg at 09/05/24 1210   hydrocortisone  cream 1 %   Topical TID Laurita Pillion, MD   Given at 09/05/24 1211   hydrOXYzine  (ATARAX ) tablet 50 mg  50 mg Oral Daily PRN Smith, Annie B, NP       influenza vac split trivalent PF (FLUZONE ) injection 0.5 mL  0.5 mL Intramuscular Tomorrow-1000 Donnelly Mellow, MD       magnesium  hydroxide (MILK OF MAGNESIA)  suspension 30 mL  30 mL Oral Daily PRN Smith, Annie B, NP       nicotine  (NICODERM CQ  - dosed in mg/24 hours) patch 21 mg  21 mg Transdermal Daily Jadapalle, Sree, MD   21 mg at 09/05/24 9171   pneumococcal 20-valent conjugate vaccine (PREVNAR 20 ) injection 0.5 mL  0.5 mL Intramuscular Tomorrow-1000 Donnelly Mellow, MD       traZODone  (DESYREL ) tablet 100 mg  100 mg Oral QHS Millington, Matthew E, PA-C       Facility-Administered Medications Ordered in Other Encounters  Medication Dose Route Frequency Provider Last Rate Last Admin   heparin  lock flush 100 UNIT/ML injection            heparin  lock flush 100 UNIT/ML injection            sodium chloride  flush (NS) 0.9 % injection 10 mL  10 mL Intravenous PRN Melanee Annah BROCKS, MD   10 mL at 04/05/22 1112   PTA Medications: Medications Prior to Admission  Medication Sig Dispense Refill Last Dose/Taking   DULoxetine  (CYMBALTA ) 60 MG capsule Take 1 capsule (60  mg total) by mouth daily. (Patient not taking: Reported on 09/02/2024) 30 capsule 0    gabapentin  (NEURONTIN ) 400 MG capsule Take 1 capsule (400 mg total) by mouth 2 (two) times daily. (Patient not taking: Reported on 09/02/2024) 60 capsule 0    hydrOXYzine  (ATARAX ) 50 MG tablet Take 1 tablet (50 mg total) by mouth daily as needed for anxiety. (Patient not taking: Reported on 09/02/2024) 30 tablet 0    nystatin  (MYCOSTATIN /NYSTOP ) powder Apply 1 Application topically 3 (three) times daily.      traZODone  (DESYREL ) 50 MG tablet Take 1 tablet (50 mg total) by mouth at bedtime. (Patient not taking: Reported on 09/02/2024) 30 tablet 0     Patient Stressors: Loss of best friend to recent suicide     Patient Strengths: Capable of independent living  Forensic Psychologist fund of knowledge  Motivation for treatment/growth   Treatment Modalities: Medication Management, Group therapy, Case management,  1 to 1 session with clinician, Psychoeducation, Recreational therapy.   Physician  Treatment Plan for Primary Diagnosis: MDD (major depressive disorder) Long Term Goal(s): Improvement in symptoms so as ready for discharge   Short Term Goals: Ability to identify changes in lifestyle to reduce recurrence of condition will improve Ability to disclose and discuss suicidal ideas Ability to demonstrate self-control will improve Ability to identify and develop effective coping behaviors will improve  Medication Management: Evaluate patient's response, side effects, and tolerance of medication regimen.  Therapeutic Interventions: 1 to 1 sessions, Unit Group sessions and Medication administration.  Evaluation of Outcomes: Not Met  Physician Treatment Plan for Secondary Diagnosis: Principal Problem:   MDD (major depressive disorder)  Long Term Goal(s): Improvement in symptoms so as ready for discharge   Short Term Goals: Ability to identify changes in lifestyle to reduce recurrence of condition will improve Ability to disclose and discuss suicidal ideas Ability to demonstrate self-control will improve Ability to identify and develop effective coping behaviors will improve     Medication Management: Evaluate patient's response, side effects, and tolerance of medication regimen.  Therapeutic Interventions: 1 to 1 sessions, Unit Group sessions and Medication administration.  Evaluation of Outcomes: Not Met   RN Treatment Plan for Primary Diagnosis: MDD (major depressive disorder) Long Term Goal(s): Knowledge of disease and therapeutic regimen to maintain health will improve  Short Term Goals: Ability to verbalize frustration and anger appropriately will improve, Ability to demonstrate self-control, Ability to participate in decision making will improve, Ability to verbalize feelings will improve, Ability to disclose and discuss suicidal ideas, and Ability to identify and develop effective coping behaviors will improve  Medication Management: RN will administer medications as  ordered by provider, will assess and evaluate patient's response and provide education to patient for prescribed medication. RN will report any adverse and/or side effects to prescribing provider.  Therapeutic Interventions: 1 on 1 counseling sessions, Psychoeducation, Medication administration, Evaluate responses to treatment, Monitor vital signs and CBGs as ordered, Perform/monitor CIWA, COWS, AIMS and Fall Risk screenings as ordered, Perform wound care treatments as ordered.  Evaluation of Outcomes: Not Met   LCSW Treatment Plan for Primary Diagnosis: MDD (major depressive disorder) Long Term Goal(s): Safe transition to appropriate next level of care at discharge, Engage patient in therapeutic group addressing interpersonal concerns.  Short Term Goals: Engage patient in aftercare planning with referrals and resources, Increase social support, Increase ability to appropriately verbalize feelings, Increase emotional regulation, Facilitate acceptance of mental health diagnosis and concerns, Facilitate patient progression through stages of change  regarding substance use diagnoses and concerns, Identify triggers associated with mental health/substance abuse issues, and Increase skills for wellness and recovery  Therapeutic Interventions: Assess for all discharge needs, 1 to 1 time with Social worker, Explore available resources and support systems, Assess for adequacy in community support network, Educate family and significant other(s) on suicide prevention, Complete Psychosocial Assessment, Interpersonal group therapy.  Evaluation of Outcomes: Not Met   Progress in Treatment: Attending groups: Yes. Participating in groups: Yes. Taking medication as prescribed: Yes. Toleration medication: Yes. Family/Significant other contact made: No, will contact:  Patient declined contact. CSW to contact if permission is granted.  Patient understands diagnosis: Yes. Discussing patient identified  problems/goals with staff: Yes. Medical problems stabilized or resolved: Yes. Denies suicidal/homicidal ideation: Yes. Issues/concerns per patient self-inventory: No. Other: None  New problem(s) identified: No, Describe:  None  New Short Term/Long Term Goal(s):detox, elimination of symptoms of psychosis, medication management for mood stabilization; elimination of SI thoughts; development of comprehensive mental wellness/sobriety plan.    Patient Goals:  Get my mood stabilized.   Discharge Plan or Barriers: CSW to assist with the development of appropriate discharge plan.    Reason for Continuation of Hospitalization: Anxiety Delusions  Depression Suicidal ideation  Estimated Length of Stay: 1-7 days.   Last 3 [place] Suicide Severity Risk Score: Flowsheet Row Admission (Current) from 09/03/2024 in [facility] ED from 09/02/2024 in [facility] Admission (Discharged) from 06/18/2024 in [facility]PATIENT BEH[facility] MEDICINE  C-SSRS RISK CATEGORY High Risk High Risk Low Risk    Last PHQ 2/9 Scores:    10/06/2021    3:18 AM  Depression screen PHQ 2/9  Decreased Interest 1  Down, Depressed, Hopeless 2  PHQ - 2 Score 3  Altered sleeping 2  Tired, decreased energy 1  Change in appetite 1  Feeling bad or failure about yourself  1  Trouble concentrating 1  Moving slowly or fidgety/restless 0  Suicidal thoughts 1  PHQ-9 Score 10   Difficult doing work/chores Very difficult     Data saved with a previous flowsheet row definition    Scribe for Treatment Team: Page Pucciarelli M Nita Whitmire, KEN 09/05/2024 1:57 PM

## 2024-09-05 NOTE — Plan of Care (Signed)
   Problem: Education: Goal: Emotional status will improve Outcome: Progressing Goal: Mental status will improve Outcome: Progressing Goal: Verbalization of understanding the information provided will improve Outcome: Progressing

## 2024-09-05 NOTE — Group Note (Signed)
 Recreation Therapy Group Note   Group Topic:Other  Group Date: 09/05/2024 Start Time: 1530 End Time: 1620 Facilitators: Celestia Jeoffrey BRAVO, LRT, CTRS Location: Courtyard  Group Description: [employer]. LRT and patients played games of basketball, drew with chalk, and played corn hole while outside in the courtyard while getting fresh air and sunlight. Music was being played in the background. LRT and peers conversed about different games they have played before, what they do in their free time and anything else that is on their minds. LRT encouraged pts to drink water  after being outside, sweating and getting their heart rate up.  Goal Area(s) Addressed: Patient will build on frustration tolerance skills. Patients will partake in a competitive play game with peers. Patients will gain knowledge of new leisure interest/hobby.    Affect/Mood: Appropriate   Participation Level: Active   Participation Quality: Independent   Behavior: Appropriate   Speech/Thought Process: Coherent   Insight: Fair   Judgement: Fair    Modes of Intervention: Activity   Patient Response to Interventions:  Receptive   Education Outcome:  In group clarification offered    Clinical Observations/Individualized Feedback: Paul Hebert was active in their participation of session activities and group discussion. Pt interacted well with LRT and peers duration of session.    Plan: Continue to engage patient in RT group sessions 2-3x/week.   Jeoffrey BRAVO Celestia, LRT, CTRS 09/05/2024 5:16 PM

## 2024-09-05 NOTE — Progress Notes (Signed)
 NUTRITION ASSESSMENT  Pt identified as at risk on the Malnutrition Screen Tool  INTERVENTION:  -Continue regular diet -MVI with minerals daily -1 mg folic acid  daily -100 mg thiamine  daily -Ensure Plus High Protein po TID, each supplement provides 350 kcal and 20 grams of protein  -Magic cup TID with meals, each supplement provides 290 kcal and 9 grams of protein  -Consult to Spiritual Care  NUTRITION DIAGNOSIS: Unintentional weight loss related to sub-optimal intake as evidenced by pt report.   Goal: Pt to meet >/= 90% of their estimated nutrition needs.  Monitor:  PO intake  Assessment:   Paul Hebert is a 53 y.o. male who presents today with concern of psychiatric evaluation. Has been having suicidal ideations over the last few days PTA, recently ran out of his home psychiatric medications. He reports that his best friend recently committed suicide, and now he is also having similar thoughts.   Patient admitted with severe and recurrent MDD.   Per H&P, patient with history of throat cancer and tracheostomy. He is followed by Dr Denys ([facility] at [facility]); plan to tracheostomy closure next month.   Case discussed with SLP prior to visit. She reports she is recommending a regular diet with thin liquids. He has oral pain secondary to radiation as well as poor dentition. Patient does not have any mechanical swallowing deficits, however, reports pain with all foods and liquids, other than water  and tea.   Spoke with patient at bedside, who was eager to engage RD in conversation and was very forthcoming about his health, social, and psychological issues. He reports that he has undergone treatment for throat cancer and his tracheostomy site is often uncomfortable for him when eating and when going out in public. He has also had a lot of emotional stress, including a recent break-up (with girlfriend of 5 years- most of his belongings are still in their home in [place]) one year ago,  passing of multiple family members, relocating, and difficulty obtaining medications. Patient states wen he is unable to afford his gabpentin he uses alcohol for pain management (1/5 daily). He states that he knows this practice is detrimental to his health and he is appreciative of the resources that he has been given including RHC, AA, and outpatient psychiatry. He expresses hope and positivity in improving his overall health and wellbeing. Emotional support provided. Offered spiritual care consult; patient agreeable and eager to speak with a chaplain as he has a strong faith.   Patient endorses that everything he eats is painful except tea and water . He states that he often eats soft foods such as mashed potatoes, even though this still hurts him. He is agreeable to regular diet so he has the widest variety of meal selections on the menu and he can shoose what he feels like eating. He states scrambled eggs are often my go to. He states that he has had a feeding tube in the past and is sick og Ensure, oatmeal, and soup as he had been on a liquid diet for a prolonged period of time. He no longer has the feeding tube and is hopeful that stoma surgery will improve his swallowing and quality of life. Discussed importance of good meal and supplement intake to promote healing. Patinet is amenable to Ensure and Magic Cups. Reviewed care plan; patient is appreciative of visit.   Patient denies any recent weight loss; noted weight has been stable over the past 2 months. He admits to history of weight  loss secondary of throat cancer. Per CareEverywhere, weight was 149# on 07/09/24 encounter. Patient has experienced a 5.2% weight loss over the past 2 months, which is not significant for time frame.   Labs reviewed.    53 y.o. male  Height: Ht Readings from Last 1 Encounters:  09/03/24 5' 8 (1.727 m)    Weight: Wt Readings from Last 1 Encounters:  09/03/24 64.4 kg    Weight Hx: Wt Readings from Last  10 Encounters:  09/03/24 64.4 kg  09/02/24 65.8 kg  06/18/24 64 kg  06/17/24 70.6 kg  07/16/23 70.6 kg  05/21/23 60.4 kg  01/17/23 60.4 kg  11/17/22 64 kg  11/08/22 63.5 kg  10/25/22 67.1 kg    BMI:  Body mass index is 21.59 kg/m. BMI WDL.   Estimated Nutritional Needs: Kcal: 25-30 kcal/kg Protein: > 1 gram protein/kg Fluid: 1 ml/kcal  Diet Order:  Diet Order             Diet regular Room service appropriate? Yes; Fluid consistency: Thin  Diet effective now                  Pt is also offered choice of unit snacks mid-morning and mid-afternoon.  Pt is eating as desired.   Lab results and medications reviewed.   Margery ORN, RD, LDN, CDCES Registered Dietitian III Certified Diabetes Care and Education Specialist If unable to reach this RD, please use RD Inpatient group chat on secure chat between hours of 8am-4 pm daily

## 2024-09-05 NOTE — Progress Notes (Signed)
   09/05/24 0826  Psych Admission Type (Psych Patients Only)  Admission Status Voluntary  Psychosocial Assessment  Patient Complaints None  Eye Contact Fair  Facial Expression Animated  Affect Appropriate to circumstance  Speech Logical/coherent  Interaction Assertive  Motor Activity Other (Comment) (WDL)  Appearance/Hygiene Unremarkable  Behavior Characteristics Cooperative;Appropriate to situation  Mood Anxious  Thought Process  Coherency WDL  Content WDL  Delusions None reported or observed  Perception WDL  Hallucination None reported or observed  Judgment Impaired  Confusion None  Danger to Self  Current suicidal ideation? Denies  Agreement Not to Harm Self Yes  Description of Agreement Verbal  Danger to Others  Danger to Others None reported or observed

## 2024-09-05 NOTE — Group Note (Signed)
 Recreation Therapy Group Note   Group Topic:Leisure Education  Group Date: 09/05/2024 Start Time: 1010 End Time: 1115 Facilitators: Celestia Jeoffrey BRAVO, LRT, CTRS Location: Craft Room  Group Description: Leisure. Patients were given the option to choose from journaling, coloring, drawing, making origami, playing with playdoh, listening to music or singing karaoke. LRT and pts discussed the meaning of leisure, the importance of participating in leisure during their free time/when they're outside of the hospital, as well as how our leisure interests can also serve as coping skills.   Goal Area(s) Addressed:  Patient will identify a current leisure interest.  Patient will learn the definition of "leisure". Patient will practice making a positive decision. Patient will have the opportunity to try a new leisure activity. Patient will communicate with peers and LRT.    Affect/Mood: Appropriate   Participation Level: Active and Engaged   Participation Quality: Independent   Behavior: Calm and Cooperative   Speech/Thought Process: Coherent   Insight: Good   Judgement: Good   Modes of Intervention: Clarification, Education, Exploration, and Music   Patient Response to Interventions:  Avoidant, Engaged, and Receptive   Education Outcome:  Acknowledges education   Clinical Observations/Individualized Feedback: Paul Hebert was active in their participation of session activities and group discussion. Pt identified hike and fish as things he does in his free time.    Plan: Continue to engage patient in RT group sessions 2-3x/week.   Jeoffrey BRAVO Celestia, LRT, CTRS 09/05/2024 11:36 AM

## 2024-09-05 NOTE — BHH Group Notes (Signed)
 Spirituality Group   Group Goal: Support / Education around grief and loss   Group Description: Following introductions and group rules, group members engaged in facilitated group dialog and support around topic of loss, with particular support around experiences of loss in their lives. Group members identified types of loss (relationships / self / things) as well as patterns, circumstances, and changes that precipitate loss. Reflection invited on thoughts / feelings around loss, normalized grief responses, and recognized variety in grief experience. Group noted Worden's four tasks of grief in discussion. Group drew on Adlerian / Rogerian, narrative, MI, with Yalom's group therapy as a primary framework.   Observations: Paul Hebert was reserved but shared when invited directly. Honest in his self-assessment and looks forward to surgery to close stoma.  Paul Hebert L. [name].Div

## 2024-09-05 NOTE — Progress Notes (Signed)
   09/05/24 1700  Spiritual Encounters  Type of Visit Initial  Care provided to: Patient  Referral source Other (comment) ([facility])  Reason for visit Routine spiritual support  OnCall Visit Yes  Spiritual Framework  Presenting Themes Meaning/purpose/sources of inspiration;Values and beliefs;Significant life change;Coping tools;Impactful experiences and emotions;Rituals and practive;Community and relationships  Values/beliefs Faith and connection to the divine.  Community/Connection Family (Patient calls older brother his rock.)  Strengths Knows there is a purpose for his life and a reson he's still here.  Needs/Challenges/Barriers Addiction, grief, loss and SI thoughts in the past.  Patient Stress Factors Major life changes;Health changes;Loss;Financial concerns;Other (Comment) (Intimate relationships.)  Family Stress Factors None identified  Goals  Self/Personal Goals Stay on medications, stay in community with like-minded people and keep focus on God.  Clinical Care Goals Continue to take meds and follow-up with cancer doctors.  Interventions  Spiritual Care Interventions Made Prayer;Encouragement;Meaning making;Explored values/beliefs/practices/strengths;Narrative/life review;Reflective listening;Compassionate presence;Reconciliation with self/others  Intervention Outcomes  Outcomes Awareness around self/spiritual resourses;Connection to values and goals of care;Reduced isolation   Chaplain offered a compassionate presence and reflective listening as patient shared about, loss, shame, guilt, grief and faith.    Rev. Rana M. Nicholaus, M.Div. Chaplain Resident [facility] Chaplain

## 2024-09-05 NOTE — Progress Notes (Signed)
 Progress Note   Patient: Paul Hebert FMW:[id number] DOB: 11/17/1970 DOA: 09/03/2024     2 DOS: the patient was seen and examined on 09/05/2024   Brief hospital course:  GLORIA LAMBERTSON is a 53 y.o. male with past medical history of tonsil cancer, status post tracheostomy, gastroesophageal reflux disease.  He was admitted to psychiatry for suicidal ideation.  He normally had to close the tracheostomy opening for eating.  This time, a tape was placed.  After removing the tape, patient was found to have a rash around the site, with significant itching. Patient did not have any shortness of breath, no cough.  TRH service was therefore contacted to assist with management  Assessment and Plan:  Skin rash in the neck secondary to allergic reaction likely secondary to latex dressing. Skin rash is significantly improved today Patient has silicone dressing in place and tolerating diet with no complaints  Status post tracheostomy. Outpatient follow-up for tracheostomy reversal scheduled for December 2025  Subjective:  Patient seen and examined at bedside this morning He admits to improvement in the rash No more itching, shortness of breath chest pain cough  Physical Exam: Extraocular Movements: Extraocular movements intact.     Conjunctiva/sclera: Conjunctivae normal.     Pupils: Pupils are equal, round, and reactive to light.  Neck:     Comments: Post tracheostomy Cardiovascular:     Rate and Rhythm: Normal rate and regular rhythm.     Heart sounds: No murmur heard.    No friction rub. No gallop.  Pulmonary:     Effort: Pulmonary effort is normal.     Breath sounds: Normal breath sounds. No wheezing or rales.  Abdominal:     General: Abdomen is flat. Bowel sounds are normal. There is no distension.     Palpations: Abdomen is soft.     Tenderness: There is no abdominal tenderness.  Musculoskeletal:        General: No swelling or tenderness. Normal range of motion.     Cervical back:  Normal range of motion. No rigidity or tenderness.  Lymphadenopathy:     Cervical: No cervical adenopathy.  Skin:    General: Skin is warm and dry.     Coloration: Skin is not jaundiced.  Neurological:     General: No focal deficit present.     Mental Status: He is alert and oriented to person, place, and time.    Vitals:   09/03/24 1800 09/04/24 0614 09/04/24 1619 09/05/24 0611  BP: 115/84 110/74 125/80 (!) 110/96  Pulse: 74 69 65 70  Resp: 16 20  20   Temp: 97.9 F (36.6 C) 98.4 F (36.9 C)    TempSrc: Oral Oral    SpO2: 100% 97% 100% 98%  Weight: 64.4 kg     Height: 5' 8 (1.727 m)       Data Reviewed:    Latest Ref Rng & Units 09/02/2024    6:16 PM 06/17/2024   12:07 PM 05/21/2023   12:13 PM  CBC  WBC 4.0 - 10.5 K/uL 7.0  7.8  6.0   Hemoglobin 13.0 - 17.0 g/dL 85.4  85.0  85.7   Hematocrit 39.0 - 52.0 % 42.0  42.0  40.7   Platelets 150 - 400 K/uL 227  265  225      Given resolution of rash and improvement of itching TRH service will sign off at this time.  Thank you for involving us  in the management of this patient.  Please reach  out should further questions arise.   Author: Drue ONEIDA Potter, MD 09/05/2024 3:48 PM  For on call review [email or web address].

## 2024-09-05 NOTE — Plan of Care (Signed)
 Upon assessment during this shift, Paul Hebert verbalized when asked how are you? he stated better than yesterday.   Paul Hebert denies SI/HI. Paul Hebert contracted for safety verbally and no other issues reported or observed. Paul Hebert also reported he has a very small Bowel movement.  Paul Hebert is a 53 y.o. male Paul Hebert. No diagnosis found. Past Medical History:  Diagnosis Date   Cancer associated pain    GERD (gastroesophageal reflux disease)    Tonsil cancer (HCC)    Current Facility-Administered Medications  Medication Dose Route Frequency Provider Last Rate Last Admin   acetaminophen  (TYLENOL ) tablet 650 mg  650 mg Oral Q6H PRN Smith, Annie B, NP       alum & mag hydroxide-simeth (MAALOX/MYLANTA) 200-200-20 MG/5ML suspension 30 mL  30 mL Oral Q4H PRN Smith, Annie B, NP       haloperidol  (HALDOL ) tablet 5 mg  5 mg Oral TID PRN Smith, Annie B, NP       And   diphenhydrAMINE  (BENADRYL ) capsule 50 mg  50 mg Oral TID PRN Smith, Annie B, NP   50 mg at 09/03/24 2311   haloperidol  lactate (HALDOL ) injection 5 mg  5 mg Intramuscular TID PRN Smith, Annie B, NP       And   diphenhydrAMINE  (BENADRYL ) injection 50 mg  50 mg Intramuscular TID PRN Smith, Annie B, NP       And   LORazepam  (ATIVAN ) injection 2 mg  2 mg Intramuscular TID PRN Smith, Annie B, NP       haloperidol  lactate (HALDOL ) injection 10 mg  10 mg Intramuscular TID PRN Smith, Annie B, NP       And   diphenhydrAMINE  (BENADRYL ) injection 50 mg  50 mg Intramuscular TID PRN Smith, Annie B, NP       And   LORazepam  (ATIVAN ) injection 2 mg  2 mg Intramuscular TID PRN Smith, Annie B, NP       DULoxetine  (CYMBALTA ) DR capsule 60 mg  60 mg Oral Daily Smith, Annie B, NP   60 mg at 09/04/24 0815   gabapentin  (NEURONTIN ) capsule 100 mg  100 mg Oral BID Smith, Annie B, NP   100 mg at 09/04/24 1728   hydrocortisone  cream 1 %   Topical TID Laurita Pillion, MD   Given at 09/04/24 1728   hydrOXYzine  (ATARAX ) tablet 50 mg  50 mg Oral Daily PRN Smith,  Annie B, NP       influenza vac split trivalent PF (FLUZONE ) injection 0.5 mL  0.5 mL Intramuscular Tomorrow-1000 Donnelly Mellow, MD       magnesium  hydroxide (MILK OF MAGNESIA) suspension 30 mL  30 mL Oral Daily PRN Smith, Annie B, NP       nicotine  (NICODERM CQ  - dosed in mg/24 hours) patch 21 mg  21 mg Transdermal Daily Jadapalle, Sree, MD   21 mg at 09/04/24 0816   pneumococcal 20-valent conjugate vaccine (PREVNAR 20 ) injection 0.5 mL  0.5 mL Intramuscular Tomorrow-1000 Donnelly Mellow, MD       traZODone  (DESYREL ) tablet 50 mg  50 mg Oral QHS Smith, Annie B, NP   50 mg at 09/04/24 2110   Facility-Administered Medications Ordered in Other Encounters  Medication Dose Route Frequency Provider Last Rate Last Admin   heparin  lock flush 100 UNIT/ML injection            heparin  lock flush 100 UNIT/ML injection            sodium chloride  flush (NS) 0.9 %  injection 10 mL  10 mL Intravenous PRN Rao, Archana C, MD   10 mL at 04/05/22 1112   No Known Allergies Principal Problem:   MDD (major depressive disorder)  Blood pressure 125/80, pulse 65, temperature 98.4 F (36.9 C), temperature source Oral, resp. rate 20, height 5' 8 (1.727 m), weight 64.4 kg, SpO2 100%.  Subjective Objective Assessment & Plan  Paul Hebert Paul Hebert 09/05/2024

## 2024-09-05 NOTE — Evaluation (Signed)
 Clinical/Bedside Swallow Evaluation Patient Details  Name: Paul Hebert MRN: [id number] Date of Birth: 06/14/1971  Today's Date: 09/05/2024 Time: SLP Start Time (ACUTE ONLY): 1207 SLP Stop Time (ACUTE ONLY): 1227 SLP Time Calculation (min) (ACUTE ONLY): 20 min  Past Medical History:  Past Medical History:  Diagnosis Date   Cancer associated pain    GERD (gastroesophageal reflux disease)    Tonsil cancer [facility])    Past Surgical History:  Past Surgical History:  Procedure Laterality Date   LARYNGOSCOPY Left 01/24/2022   Procedure: DIRECT LARYNGOSCOPY WITH TONSIL BIOPSY;  Surgeon: Herminio Miu, MD;  Location: [facility] ORS;  Service: ENT;  Laterality: Left;   PORT-A-CATH REMOVAL N/A 02/04/2021   Procedure: REMOVAL PORT-A-CATH;  Surgeon: Desiderio Schanz, MD;  Location: [facility] ORS;  Service: General;  Laterality: N/A;   PORTA CATH INSERTION N/A 10/25/2020   Procedure: PORTA CATH INSERTION;  Surgeon: Marea Selinda RAMAN, MD;  Location: [facility] INVASIVE CV LAB;  Service: Cardiovascular;  Laterality: N/A;   PORTA CATH INSERTION N/A 02/09/2022   Procedure: PORTA CATH INSERTION;  Surgeon: Marea Selinda RAMAN, MD;  Location: [facility] INVASIVE CV LAB;  Service: Cardiovascular;  Laterality: N/A;   REMOVAL OF GASTROSTOMY TUBE N/A 02/04/2021   Procedure: REMOVAL OF GASTROSTOMY TUBE;  Surgeon: Desiderio Schanz, MD;  Location: [facility] ORS;  Service: General;  Laterality: N/A;   TONGUE BIOPSY Left 01/24/2022   Procedure: TONGUE BIOPSY;  Surgeon: Herminio Miu, MD;  Location: [facility] ORS;  Service: ENT;  Laterality: Left;   HPI:  Pt is a 53 year old male with 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 . Pt is followed by Reyes Ovens at [facility] with recent appt on 08/20/2024. Surgery scheduled on 09/30/2024 for closure of TC fistula.    Assessment / Plan / Recommendation  Clinical Impression  Pt has a bandage over stoma and  places his finger over bandage to improve phonation. He is a great historian. He confirms the above information and provides 6-7 chronic pain on left BOT. Pain is not decreased with medication, food textures or temperatures. He doesn't report any acute issues related to pain or oral anatomy.       Oral mechanical examination revealed bleeding at the lingual gum line of left posterior molars with fillings missing on last 2 molars. Hemiatrophy of the left tongue present. I was not able to visualize the area that pt indicates as being the location of his radiation burns as these areas are more posterior in nature.       Finally he presents with adequate oropharyngeal abilities when consuming PO during this session. As such, skilled ST services don't appear indicated at this time. SLP Visit Diagnosis: Dysphagia, oral phase (R13.11)    Aspiration Risk  Mild aspiration risk    Diet Recommendation Regular;Thin liquid    Liquid Administration via: Cup;Straw Medication Administration: Whole meds with liquid Supervision: Patient able to self feed Compensations: Minimize environmental distractions;Slow rate;Small sips/bites Postural Changes: Seated upright at 90 degrees;Remain upright for at least 30 minutes after po intake    Other  Recommendations Recommended Consults:  (already followed by [facility]) Oral Care Recommendations: Oral care BID     Functional Status Assessment Patient has not had a recent decline in their functional status    Swallow Study   General Date of Onset: 09/03/24 (chronic oral pain ~ 2023) HPI: Pt is a 53 year old male with  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 . Pt is followed by Reyes Ovens at [facility] with recent appt on 08/20/2024. Surgery scheduled on 09/30/2024 for closure of TC fistula. Type of Study: Bedside Swallow Evaluation Previous Swallow Assessment:  none inchart Diet Prior to this Study: Regular;Thin liquids (Level 0) Temperature Spikes Noted: No Respiratory Status: Room air History of Recent Intubation: No Behavior/Cognition: Alert;Cooperative;Pleasant mood Oral Cavity Assessment: Within Functional Limits Oral Care Completed by SLP: No Oral Cavity - Dentition: Poor condition;Missing dentition;Adequate natural dentition Vision: Functional for self-feeding Self-Feeding Abilities: Able to feed self Patient Positioning: Upright in chair Baseline Vocal Quality: Normal (has stoma but was covered with bandage) Volitional Cough: Strong Volitional Swallow: Able to elicit    Oral/Motor/Sensory Function Overall Oral Motor/Sensory Function: Mild impairment Facial ROM: Within Functional Limits Facial Symmetry: Within Functional Limits Facial Strength: Within Functional Limits Facial Sensation: Within Functional Limits Lingual ROM: Reduced left Lingual Symmetry: Abnormal symmetry left Lingual Strength: Reduced Lingual Sensation: Reduced Velum: Within Functional Limits Mandible: Within Functional Limits   Ice Chips Ice chips: Not tested   Thin Liquid Thin Liquid: Within functional limits Presentation: Self Fed;Cup    Nectar Thick Nectar Thick Liquid: Not tested   Honey Thick Honey Thick Liquid: Not tested   Puree Puree: Within functional limits Presentation: Self Fed   Solid     Solid: Not tested     Kalilah Barua B. Rubbie, M.S., CCC-SLP, Tree Surgeon Certified Brain Injury Specialist [facility]  [facility] Rehabilitation Services Office [phone] Ascom [phone] Fax [phone]

## 2024-09-06 MED ORDER — NICOTINE POLACRILEX 2 MG MT GUM
2.0000 mg | CHEWING_GUM | OROMUCOSAL | Status: DC | PRN
  Administered 2024-09-07: 2 mg via ORAL
  Filled 2024-09-06: qty 1

## 2024-09-06 NOTE — Progress Notes (Signed)
   09/05/24 2000  Psych Admission Type (Psych Patients Only)  Admission Status Voluntary  Psychosocial Assessment  Patient Complaints None  Eye Contact Fair  Facial Expression Flat  Affect Appropriate to circumstance  Speech Logical/coherent  Interaction Assertive  Motor Activity Other (Comment) (15 minutes safety checks maintained)  Appearance/Hygiene Unremarkable  Behavior Characteristics Cooperative;Appropriate to situation  Mood Anxious  Thought Process  Coherency WDL  Content WDL  Delusions None reported or observed  Perception WDL  Hallucination None reported or observed  Judgment Impaired  Confusion None  Danger to Self  Current suicidal ideation? Denies  Agreement Not to Harm Self Yes  Description of Agreement verbal  Danger to Others  Danger to Others None reported or observed   No distress noted denies SI/HI/AVH, airway is intact no respiratory distress. 15 minutes safety checks.

## 2024-09-06 NOTE — Progress Notes (Signed)
   09/06/24 0808  Psychosocial Assessment  Patient Complaints Anxiety  Eye Contact Fair  Facial Expression Other (Comment) (WDL)  Affect Appropriate to circumstance  Speech Logical/coherent  Interaction Assertive  Motor Activity Other (Comment) (WDL)  Appearance/Hygiene Unremarkable  Behavior Characteristics Cooperative;Appropriate to situation  Mood Anxious  Thought Process  Coherency WDL  Content WDL  Delusions None reported or observed  Perception WDL  Hallucination None reported or observed  Judgment Impaired  Confusion None  Danger to Self  Current suicidal ideation? Denies  Agreement Not to Harm Self Yes  Description of Agreement Verbal  Danger to Others  Danger to Others None reported or observed

## 2024-09-06 NOTE — Plan of Care (Signed)
 ?  Problem: Education: ?Goal: Mental status will improve ?Outcome: Progressing ?Goal: Verbalization of understanding the information provided will improve ?Outcome: Progressing ?  ?

## 2024-09-06 NOTE — Group Note (Signed)
 [facility] LCSW Group Therapy Note   Group Date: 09/06/2024 Start Time: 1330 End Time: 1430   Type of Therapy/Topic:  Group Therapy:  Emotion Regulation  Participation Level:  Active   Mood:  Description of Group:    The purpose of this group is to assist patients in learning to regulate negative emotions and experience positive emotions. Patients will be guided to discuss ways in which they have been vulnerable to their negative emotions. These vulnerabilities will be juxtaposed with experiences of positive emotions or situations, and patients challenged to use positive emotions to combat negative ones. Special emphasis will be placed on coping with negative emotions in conflict situations, and patients will process healthy conflict resolution skills.  Therapeutic Goals: Patient will identify two positive emotions or experiences to reflect on in order to balance out negative emotions:  Patient will label two or more emotions that they find the most difficult to experience:  Patient will be able to demonstrate positive conflict resolution skills through discussion or role plays:   Summary of Patient Progress:   Patient was attentive and engaged during today's group session    Therapeutic Modalities:   Cognitive Behavioral Therapy Feelings Identification Dialectical Behavioral Therapy   Aldo CHRISTELLA Niece, LCSW

## 2024-09-06 NOTE — Progress Notes (Signed)
   09/06/24 2100  Psych Admission Type (Psych Patients Only)  Admission Status Voluntary  Psychosocial Assessment  Patient Complaints None  Eye Contact Fair  Facial Expression Flat  Affect Appropriate to circumstance  Speech Logical/coherent  Interaction Assertive  Motor Activity Other (Comment) (15 minutes safety checks maintained)  Appearance/Hygiene Unremarkable  Behavior Characteristics Cooperative  Mood Anxious  Thought Process  Coherency WDL  Content WDL  Delusions None reported or observed  Perception WDL  Hallucination None reported or observed  Judgment Impaired  Confusion None  Danger to Self  Current suicidal ideation? Denies  Agreement Not to Harm Self Yes  Description of Agreement verbal  Danger to Others  Danger to Others None reported or observed   Patient alert and oriented x 4, affect is flat, but brightens upon approach. Patient denies SI/HI/AVH. Patient has a stoma from a past tacheostomy,  15 minutes safety checks maintained.

## 2024-09-06 NOTE — Progress Notes (Signed)
 [facility] MD Progress Note  09/06/2024 2:13 PM Paul Hebert  MRN:  [id number]  Paul Hebert is a 53 y.o. male who presents today with concern of psychiatric evaluation.  Has been having suicidal ideations over the last few days, recently ran out of his home psychiatric medications.  He tells me that his best friend recently committed suicide, and now he is also having similar thoughts.  Denies any attempts today.  Denies any other medical complaints although he does have underlying history of cancer that is now in remission.  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   Subjective:  Chart reviewed, case discussed in multidisciplinary meeting, patient seen during rounds.  11/22: On exam patient is alert and oriented.  They are pleasant and cooperative.  They continue to note improving depression.  They deny SI, HI, and AVH.  They are linear logical and future oriented.  Reviewed crisis resources.  Encouraged patient to continue to work to develop coping skills.  They demonstrate good insight into the need for outpatient follow-up and medication compliance.  They note some ongoing pain and dry mouth.  They voiced no further concerns or complaints.   11/21 On exam today patient is found in room.  They are alert and oriented.  They are pleasant and cooperative.  He reports he is feeling better today.  He indicates he is having mouth pain secondary to radiation burns which has previously responded to gabapentin  400 mg 3 times daily.  Discussed had been off of for a while we will start at 300 mg tid.  He was agreeable to nutrition and SLP consult.  The rash around his trach is improving.  He denies SI, HI, and AVH.  He discusses some guilt over the best friend committing suicide he feels like if he had loaned him some money that might of stopped him from doing that.  Discussed processing guilt and grief.  Discussed  coping skills.  Encourage patient to continue participating in group sessions.  He is open to enrolling in therapy.  He is open to following up with [facility].  Will increase trazodone .  He is also interested in following up with AA.   Past Psychiatric History: see h&P Family History:  Family History  Problem Relation Age of Onset   Cancer Father    Social History:  Social History   Substance and Sexual Activity  Alcohol Use Yes   Comment: Patient reports 1 pint to 1/5th daily on 09/03/24     Social History   Substance and Sexual Activity  Drug Use Yes   Types: Marijuana, Crack cocaine , Methamphetamines   Comment: marijuana every day except the joint    Social History   Socioeconomic History   Marital status: Single    Spouse name: Not on file   Number of children: Not on file   Years of education: Not on file   Highest education level: Not on file  Occupational History   Not on file  Tobacco Use   Smoking status: Every Day    Current packs/day: 0.50    Average packs/day: 0.5 packs/day for 35.0 years (17.5 ttl pk-yrs)    Types: Cigarettes   Smokeless tobacco: Former   Tobacco comments:    Patches very rarely  Vaping Use   Vaping status: Former  Substance and Sexual Activity   Alcohol use: Yes    Comment: Patient  reports 1 pint to 1/5th daily on 09/03/24   Drug use: Yes    Types: Marijuana, Crack cocaine , Methamphetamines    Comment: marijuana every day except the joint   Sexual activity: Not Currently  Other Topics Concern   Not on file  Social History Narrative   Not on file   Social Drivers of Health   Financial Resource Strain: Medium Risk (06/13/2022)   Received from [facility]   Overall Financial Resource Strain (CARDIA)    Difficulty of Paying Living Expenses: Somewhat hard  Food Insecurity: Food Insecurity Present (09/03/2024)   Hunger Vital Sign    Worried About Running Out of Food in the Last Year: Sometimes true    Ran Out of Food in the  Last Year: Sometimes true  Transportation Needs: No Transportation Needs (09/03/2024)   PRAPARE - Administrator, Civil Service (Medical): No    Lack of Transportation (Non-Medical): No  Recent Concern: Transportation Needs - Unmet Transportation Needs (06/18/2024)   PRAPARE - Administrator, Civil Service (Medical): Yes    Lack of Transportation (Non-Medical): Yes  Physical Activity: Not on file  Stress: Not on file  Social Connections: Not on file   Past Medical History:  Past Medical History:  Diagnosis Date   Cancer associated pain    GERD (gastroesophageal reflux disease)    Tonsil cancer (HCC)     Past Surgical History:  Procedure Laterality Date   LARYNGOSCOPY Left 01/24/2022   Procedure: DIRECT LARYNGOSCOPY WITH TONSIL BIOPSY;  Surgeon: Herminio Miu, MD;  Location: [facility] OR[facility];  Service: ENT;  Laterality: Left;   PORT-A-CATH REMOVAL N/A 02/04/2021   Procedure: REMOVAL PORT-A-CATH;  Surgeon: Desiderio Schanz, MD;  Location: [facility];  Service: General;  Laterality: N/A;   PORTA CATH INSERTION N/A 10/25/2020   Procedure: PORTA CATH INSERTION;  Surgeon: Marea Selinda RAMAN, MD;  Location: [facility];  Service: Cardiovascular;  Laterality: N/A;   PORTA CATH INSERTION N/A 02/09/2022   Procedure: PORTA CATH INSERTION;  Surgeon: Marea Selinda RAMAN, MD;  Location: [facility];  Service: Cardiovascular;  Laterality: N/A;   REMOVAL OF GASTROSTOMY TUBE N/A 02/04/2021   Procedure: REMOVAL OF GASTROSTOMY TUBE;  Surgeon: Desiderio Schanz, MD;  Location: [facility];  Service: General;  Laterality: N/A;   TONGUE BIOPSY Left 01/24/2022   Procedure: TONGUE BIOPSY;  Surgeon: Herminio Miu, MD;  Location: [facility];  Service: ENT;  Laterality: Left;    Current Medications: Current Facility-Administered Medications  Medication Dose Route Frequency Provider Last Rate Last Admin   acetaminophen  (TYLENOL ) tablet 650 mg  650 mg Oral Q6H PRN Smith, Annie B, NP   650 mg at 09/05/24  2055   alum & mag hydroxide-simeth (MAALOX/MYLANTA) 200-200-20 MG/5ML suspension 30 mL  30 mL Oral Q4H PRN Smith, Annie B, NP       haloperidol  (HALDOL ) tablet 5 mg  5 mg Oral TID PRN Smith, Annie B, NP       And   diphenhydrAMINE  (BENADRYL ) capsule 50 mg  50 mg Oral TID PRN Smith, Annie B, NP   50 mg at 09/03/24 2311   haloperidol  lactate (HALDOL ) injection 5 mg  5 mg Intramuscular TID PRN Smith, Annie B, NP       And   diphenhydrAMINE  (BENADRYL ) injection 50 mg  50 mg Intramuscular TID PRN Smith, Annie B, NP       And   LORazepam  (ATIVAN ) injection 2 mg  2 mg Intramuscular  TID PRN Smith, Annie B, NP       haloperidol  lactate (HALDOL ) injection 10 mg  10 mg Intramuscular TID PRN Smith, Annie B, NP       And   diphenhydrAMINE  (BENADRYL ) injection 50 mg  50 mg Intramuscular TID PRN Smith, Annie B, NP       And   LORazepam  (ATIVAN ) injection 2 mg  2 mg Intramuscular TID PRN Smith, Annie B, NP       DULoxetine  (CYMBALTA ) DR capsule 60 mg  60 mg Oral Daily Smith, Annie B, NP   60 mg at 09/06/24 9191   feeding supplement (ENSURE PLUS HIGH PROTEIN) liquid 237 mL  237 mL Oral TID BM Jadapalle, Sree, MD   237 mL at 09/05/24 1459   folic acid  (FOLVITE ) tablet 1 mg  1 mg Oral Daily Jadapalle, Sree, MD   1 mg at 09/06/24 9191   gabapentin  (NEURONTIN ) capsule 300 mg  300 mg Oral TID Alanys Godino E, PA-C   300 mg at 09/06/24 1226   hydrocortisone  cream 1 %   Topical TID Laurita Pillion, MD   Given at 09/06/24 1226   hydrOXYzine  (ATARAX ) tablet 50 mg  50 mg Oral Daily PRN Smith, Annie B, NP       influenza vac split trivalent PF (FLUZONE ) injection 0.5 mL  0.5 mL Intramuscular Tomorrow-1000 Donnelly Mellow, MD       magnesium  hydroxide (MILK OF MAGNESIA) suspension 30 mL  30 mL Oral Daily PRN Smith, Annie B, NP   30 mL at 09/05/24 1733   multivitamin with minerals tablet 1 tablet  1 tablet Oral Daily Jadapalle, Sree, MD   1 tablet at 09/06/24 9191   nicotine  (NICODERM CQ  - dosed in mg/24 hours) patch  21 mg  21 mg Transdermal Daily Jadapalle, Sree, MD   21 mg at 09/06/24 0809   pneumococcal 20-valent conjugate vaccine (PREVNAR 20 ) injection 0.5 mL  0.5 mL Intramuscular Tomorrow-1000 Jadapalle, Sree, MD       thiamine  (VITAMIN B1) tablet 100 mg  100 mg Oral Daily Jadapalle, Sree, MD   100 mg at 09/06/24 9191   traZODone  (DESYREL ) tablet 100 mg  100 mg Oral QHS Abid Bolla E, PA-C   100 mg at 09/05/24 2056   Facility-Administered Medications Ordered in Other Encounters  Medication Dose Route Frequency Provider Last Rate Last Admin   heparin  lock flush 100 UNIT/ML injection            heparin  lock flush 100 UNIT/ML injection            sodium chloride  flush (NS) 0.9 % injection 10 mL  10 mL Intravenous PRN Rao, Archana C, MD   10 mL at 04/05/22 1112    Lab Results: No results found for this or any previous visit (from the past 48 hours).  Blood Alcohol level:  Lab Results  Component Value Date   [facility] <15 09/02/2024   ETH <15 06/17/2024    Metabolic Disorder Labs: Lab Results  Component Value Date   HGBA1C 5.6 06/19/2024   MPG 114 06/19/2024   MPG 108.28 10/05/2021   No results found for: PROLACTIN Lab Results  Component Value Date   CHOL 168 06/19/2024   TRIG 62 06/19/2024   HDL 58 06/19/2024   CHOLHDL 2.9 06/19/2024   VLDL 12 06/19/2024   LDLCALC 98 06/19/2024   LDLCALC 133 (H) 10/05/2021    Physical Findings: AIMS:  , ,  ,  ,    CIWA:  COWS:      Psychiatric Specialty Exam:  Presentation  General Appearance:  Appropriate for Environment  Eye Contact: Fair  Speech: Clear and Coherent  Speech Volume: Normal    Mood and Affect  Mood: Depressed  Affect: Congruent   Thought Process  Thought Processes: Coherent; Linear  Orientation:Full (Time, Place and Person)  Thought Content:WDL  Hallucinations:No data recorded  Ideas of Reference:None  Suicidal Thoughts:No data recorded  Homicidal Thoughts:No data recorded   Sensorium   Memory: Immediate Fair; Recent Fair  Judgment: Poor  Insight: Fair   Chartered Certified Accountant: Fair  Attention Span: Fair  Recall: [employer] of Knowledge: Fair  Language: Fair   Psychomotor Activity  Psychomotor Activity: No data recorded  Musculoskeletal: Strength & Muscle Tone: within normal limits Gait & Station: normal Assets  Assets: Manufacturing Systems Engineer; Desire for Improvement    Physical Exam: Physical Exam Vitals and nursing note reviewed.  HENT:     Head: Atraumatic.  Eyes:     Extraocular Movements: Extraocular movements intact.  Pulmonary:     Effort: Pulmonary effort is normal.  Neurological:     Mental Status: He is alert and oriented to person, place, and time.  Psychiatric:        Behavior: Behavior normal.    Review of Systems  Psychiatric/Behavioral:  Positive for depression. Negative for hallucinations, substance abuse and suicidal ideas. The patient is nervous/anxious and has insomnia.    Blood pressure 126/89, pulse 69, temperature 98.4 F (36.9 C), temperature source Oral, resp. rate 20, height 5' 8 (1.727 m), weight 64.4 kg, SpO2 100%. Body mass index is 21.59 kg/m.  Diagnosis: Principal Problem:   MDD (major depressive disorder)   PLAN: Safety and Monitoring:  -- Voluntary admission to inpatient psychiatric unit for safety, stabilization and treatment  -- Daily contact with patient to assess and evaluate symptoms and progress in treatment  -- Patient's case to be discussed in multi-disciplinary team meeting  -- Observation Level : q15 minute checks  -- Vital signs:  q12 hours  -- Precautions: suicide, elopement, and assault -- Encouraged patient to participate in unit milieu and in scheduled group therapies  2. Psychiatric Treatment:  Scheduled Medications:  Continue Cymbalta  60 mg daily.  Continue gabapentin  to 300 mg 3 times daily  Continue trazodone  to 100 mg nightly    -- The  risks/benefits/side-effects/alternatives to this medication were discussed in detail with the patient and time was given for questions. The patient consents to medication trial.  3. Medical Issues Being Addressed:   SLP and nutrition consult no acute concerns.  4. Discharge Planning:   -- Social work and case management to assist with discharge planning and identification of hospital follow-up needs prior to discharge  -- Estimated LOS: 3-4 days  Donnice FORBES Right, PA-C 09/06/2024, 2:13 PM

## 2024-09-06 NOTE — Group Note (Signed)
 Date:  09/06/2024 Time:  8:30 PM  Group Topic/Focus:  Making Healthy Choices:   The focus of this group is to help patients identify negative/unhealthy choices they were using prior to admission and identify positive/healthier coping strategies to replace them upon discharge. Self Care:   The focus of this group is to help patients understand the importance of self-care in order to improve or restore emotional, physical, spiritual, interpersonal, and financial health. Wrap-Up Group:   The focus of this group is to help patients review their daily goal of treatment and discuss progress on daily workbooks.    Participation Level:  Active  Participation Quality:  Appropriate  Affect:  Appropriate  Cognitive:  Alert, Appropriate, and Oriented  Insight: Appropriate and Good  Engagement in Group:  Engaged  Modes of Intervention:  Discussion and Support  Additional Comments:  N/A  Butler LITTIE Gelineau 09/06/2024, 8:30 PM

## 2024-09-07 MED ORDER — GABAPENTIN 300 MG PO CAPS: 300.0000 mg | ORAL_CAPSULE | Freq: Three times a day (TID) | ORAL | Status: DC

## 2024-09-07 MED ORDER — GABAPENTIN 300 MG PO CAPS
300.0000 mg | ORAL_CAPSULE | Freq: Three times a day (TID) | ORAL | Status: DC
  Administered 2024-09-07 – 2024-09-08 (×4): 300 mg via ORAL
  Filled 2024-09-07 (×4): qty 1

## 2024-09-07 NOTE — Plan of Care (Signed)
   Problem: Education: Goal: Emotional status will improve Outcome: Progressing Goal: Mental status will improve Outcome: Progressing

## 2024-09-07 NOTE — Group Note (Signed)
 Date:  09/07/2024 Time:  9:27 PM  Group Topic/Focus:  Activity Group: The focus of the group is to encourage patients to go outside to the courtyard and get some fresh air and some exercise.    Participation Level:  Did Not Attend   Camellia HERO Shelma Eiben 09/07/2024, 9:27 PM

## 2024-09-07 NOTE — Group Note (Signed)
 Date:  09/07/2024 Time:  8:29 PM  Group Topic/Focus:  Making Healthy Choices:   The focus of this group is to help patients identify negative/unhealthy choices they were using prior to admission and identify positive/healthier coping strategies to replace them upon discharge. Managing Feelings:   The focus of this group is to identify what feelings patients have difficulty handling and develop a plan to handle them in a healthier way upon discharge. Self Care:   The focus of this group is to help patients understand the importance of self-care in order to improve or restore emotional, physical, spiritual, interpersonal, and financial health.    Participation Level:  Active  Participation Quality:  Appropriate  Affect:  Appropriate  Cognitive:  Appropriate and Oriented  Insight: Appropriate  Engagement in Group:  Engaged  Modes of Intervention:  Discussion and Support  Additional Comments:  N/A  Butler LITTIE Gelineau 09/07/2024, 8:29 PM

## 2024-09-07 NOTE — Progress Notes (Signed)
   09/07/24 0835  Psych Admission Type (Psych Patients Only)  Admission Status Voluntary  Psychosocial Assessment  Patient Complaints None  Eye Contact Fair  Facial Expression Other (Comment) (WDL)  Affect Appropriate to circumstance  Speech Logical/coherent  Interaction Assertive  Motor Activity Other (Comment) (WDL)  Appearance/Hygiene Unremarkable  Behavior Characteristics Cooperative  Mood Anxious  Thought Process  Coherency WDL  Content WDL  Delusions None reported or observed  Perception WDL  Hallucination None reported or observed  Judgment Impaired  Confusion None  Danger to Self  Current suicidal ideation? Denies  Agreement Not to Harm Self Yes  Description of Agreement Verbal  Danger to Others  Danger to Others None reported or observed
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
## 2024-09-07 NOTE — Group Note (Signed)
 Date:  09/07/2024 Time:  9:36 PM  Group Topic/Focus:  Spirituality:   The focus of this group is to discuss how one's spirituality can aide in recovery.    Participation Level:  Did Not Attend   [name] 09/07/2024, 9:36 PM

## 2024-09-08 ENCOUNTER — Other Ambulatory Visit: Payer: Self-pay

## 2024-09-08 MED ORDER — VITAMIN B-1 100 MG PO TABS: 100.0000 mg | ORAL_TABLET | Freq: Every day | ORAL | 0 refills | Status: AC

## 2024-09-08 MED ORDER — GABAPENTIN 300 MG PO CAPS: 300.0000 mg | ORAL_CAPSULE | Freq: Three times a day (TID) | ORAL | 0 refills | Status: AC

## 2024-09-08 MED ORDER — ADULT MULTIVITAMIN W/MINERALS CH: 1.0000 | ORAL_TABLET | Freq: Every day | ORAL | 0 refills | Status: AC

## 2024-09-08 MED ORDER — FOLIC ACID 1 MG PO TABS: 1.0000 mg | ORAL_TABLET | Freq: Every day | ORAL | 0 refills | Status: AC

## 2024-09-08 MED ORDER — HYDROXYZINE HCL 50 MG PO TABS: 50.0000 mg | ORAL_TABLET | Freq: Every day | ORAL | 0 refills | Status: AC | PRN

## 2024-09-08 MED ORDER — NICOTINE 21 MG/24HR TD PT24: 21.0000 mg | MEDICATED_PATCH | Freq: Every day | TRANSDERMAL | 0 refills | Status: AC

## 2024-09-08 MED ORDER — DULOXETINE HCL 60 MG PO CPEP
60.0000 mg | ORAL_CAPSULE | Freq: Every day | ORAL | 0 refills | Status: AC
Start: 2024-09-09 — End: ?

## 2024-09-08 MED ORDER — TRAZODONE HCL 100 MG PO TABS: 100.0000 mg | ORAL_TABLET | Freq: Every evening | ORAL | 0 refills | Status: AC | PRN
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
## 2024-09-08 NOTE — Discharge Summary (Signed)
 Physician Discharge Summary Note  Patient:  Paul Hebert is an 53 y.o., male MRN:  [id number] DOB:  12/09/1970 Patient phone:  [phone] (home)  Patient address:   [address] [place] [address],   Total time spent: 40 min Date of Admission:  09/03/2024 Date of Discharge: 09/08/2024  Reason for Admission:  Suicidal ideation   Principal Problem: MDD (major depressive disorder) Discharge Diagnoses: Principal Problem:   MDD (major depressive disorder)   Past Psychiatric History: see h&p  Family Psychiatric  History: see h&p Social History:  Social History   Substance and Sexual Activity  Alcohol Use Yes   Comment: Patient reports 1 pint to 1/5th daily on 09/03/24     Social History   Substance and Sexual Activity  Drug Use Yes   Types: Marijuana, Crack cocaine , Methamphetamines   Comment: marijuana every day except the joint    Social History   Socioeconomic History   Marital status: Single    Spouse name: Not on file   Number of children: Not on file   Years of education: Not on file   Highest education level: Not on file  Occupational History   Not on file  Tobacco Use   Smoking status: Every Day    Current packs/day: 0.50    Average packs/day: 0.5 packs/day for 35.0 years (17.5 ttl pk-yrs)    Types: Cigarettes   Smokeless tobacco: Former   Tobacco comments:    Patches very rarely  Vaping Use   Vaping status: Former  Substance and Sexual Activity   Alcohol use: Yes    Comment: Patient reports 1 pint to 1/5th daily on 09/03/24   Drug use: Yes    Types: Marijuana, Crack cocaine , Methamphetamines    Comment: marijuana every day except the joint   Sexual activity: Not Currently  Other Topics Concern   Not on file  Social History Narrative   Not on file   Social Drivers of Health   Financial Resource Strain: Medium Risk (06/13/2022)   Received from [facility]   Overall Financial Resource Strain (CARDIA)    Difficulty of Paying  Living Expenses: Somewhat hard  Food Insecurity: Food Insecurity Present (09/03/2024)   Hunger Vital Sign    Worried About Running Out of Food in the Last Year: Sometimes true    Ran Out of Food in the Last Year: Sometimes true  Transportation Needs: No Transportation Needs (09/03/2024)   PRAPARE - Administrator, Civil Service (Medical): No    Lack of Transportation (Non-Medical): No  Recent Concern: Transportation Needs - Unmet Transportation Needs (06/18/2024)   PRAPARE - Administrator, Civil Service (Medical): Yes    Lack of Transportation (Non-Medical): Yes  Physical Activity: Not on file  Stress: Not on file  Social Connections: Not on file   Past Medical History:  Past Medical History:  Diagnosis Date   Cancer associated pain    GERD (gastroesophageal reflux disease)    Tonsil cancer (HCC)     Past Surgical History:  Procedure Laterality Date   LARYNGOSCOPY Left 01/24/2022   Procedure: DIRECT LARYNGOSCOPY WITH TONSIL BIOPSY;  Surgeon: Herminio Miu, MD;  Location: [facility] ORS;  Service: ENT;  Laterality: Left;   PORT-A-CATH REMOVAL N/A 02/04/2021   Procedure: REMOVAL PORT-A-CATH;  Surgeon: Desiderio Schanz, MD;  Location: [facility] ORS;  Service: General;  Laterality: N/A;   PORTA CATH INSERTION N/A 10/25/2020   Procedure: PORTA CATH INSERTION;  Surgeon: Marea,  Selinda RAMAN, MD;  Location: [facility] INVASIVE CV LAB;  Service: Cardiovascular;  Laterality: N/A;   PORTA CATH INSERTION N/A 02/09/2022   Procedure: PORTA CATH INSERTION;  Surgeon: Marea Selinda RAMAN, MD;  Location: [facility] INVASIVE CV LAB;  Service: Cardiovascular;  Laterality: N/A;   REMOVAL OF GASTROSTOMY TUBE N/A 02/04/2021   Procedure: REMOVAL OF GASTROSTOMY TUBE;  Surgeon: Desiderio Schanz, MD;  Location: [facility] ORS;  Service: General;  Laterality: N/A;   TONGUE BIOPSY Left 01/24/2022   Procedure: TONGUE BIOPSY;  Surgeon: Herminio Miu, MD;  Location: [facility] ORS;  Service: ENT;  Laterality: Left;   Family History:  Family  History  Problem Relation Age of Onset   Cancer Father     Hospital Course:    On admission, patient presented with suicidal ideations. During the admission, the patient was restarted on previously prescribed medications after reporting a lapse in adherence. He responded well to treatment with Cymbalta , gabapentin , and trazodone . Hospitalist team evaluated and treated a rash, ultimately signing off. The patient participated in therapeutic discussions focused on processing guilt and grief, identifying coping strategies, and improving emotional regulation. They attended group therapy sessions and expressed willingness to continue therapy and engage with AA following discharge.  The patient consistently presented as alert, oriented, pleasant, and cooperative. They reported progressive improvement in mood and reduction in depressive symptoms. Anxiety was described as baseline. They denied suicidal ideation, homicidal ideation, and auditory or visual hallucinations on serial interviews. They maintained safe behaviors on the unit. Their thought processes remained linear, logical, and future-oriented, and they demonstrated good insight regarding the need for outpatient follow-up and medication adherence. No medication adverse effects were reported. With continued improvement, lack of safety concerns, and voluntary status, discharge planning proceeded as requested, with patient not meeting IVC criteria. Patient denied access to guns or other lethal means. With patient's consent, safe discharge planning was discussed with patient's brother, [name], who did not voice any safety concerns.   Detailed risk assessment is complete based on clinical exam and individual risk factors and acute suicide risk is low and acute violence risk is low.    On the day of discharge, patient denies SI/HI/plan and denies hallucinations.  Patient remains future oriented and is willing to participate in outpatient mental health  services.  Currently, all modifiable risk of harm to self/harm to others have been addressed and patient is no longer appropriate for the acute inpatient setting and is able to continue treatment for mental health needs in the community with the supports as indicated below.  Patient is educated and verbalized understanding of discharge plan of care including medications, follow-up appointments, mental health resources and further crisis services in the community.  He is instructed to call 911 or present to the nearest emergency room should he experience any decompensation in mood, disturbance of bowel or return of suicidal/homicidal ideations.  Patient verbalizes understanding of this education and agrees to this plan of care  Physical Findings: AIMS:  , ,  ,  ,    CIWA:    COWS:      Psychiatric Specialty Exam:  Presentation  General Appearance:  Appropriate for Environment  Eye Contact: Good  Speech: Clear and Coherent  Speech Volume: Normal    Mood and Affect  Mood: Euthymic  Affect: Congruent   Thought Process  Thought Processes: Coherent; Linear  Descriptions of Associations:Intact  Orientation:Full (Time, Place and Person)  Thought Content:WDL  Hallucinations:Hallucinations: None  Ideas of Reference:None  Suicidal Thoughts:Suicidal Thoughts: No  Homicidal Thoughts:Homicidal Thoughts: No   Sensorium  Memory: Immediate Fair; Recent Fair  Judgment: Good  Insight: Good   Executive Functions  Concentration: Good  Attention Span: Good  Recall: Good  Fund of Knowledge: Good  Language: Good   Psychomotor Activity  Psychomotor Activity: Psychomotor Activity: Normal  Musculoskeletal: Strength & Muscle Tone: within normal limits Gait & Station: normal Assets  Assets: Manufacturing Systems Engineer; Desire for Improvement; Financial Resources/Insurance; Resilience; Housing; Leisure Time   Sleep  Sleep: Sleep: Fair    Physical  Exam: Physical Exam Vitals and nursing note reviewed.  Constitutional:      Appearance: Normal appearance.  HENT:     Head: Normocephalic and atraumatic.     Nose: Nose normal.  Eyes:     Conjunctiva/sclera: Conjunctivae normal.  Pulmonary:     Effort: Pulmonary effort is normal.  Neurological:     Mental Status: He is alert and oriented to person, place, and time.  Psychiatric:        Attention and Perception: Attention and perception normal. He does not perceive auditory or visual hallucinations.        Mood and Affect: Mood and affect normal.        Speech: Speech normal.        Behavior: Behavior is cooperative.        Thought Content: Thought content normal. Thought content is not paranoid or delusional. Thought content does not include homicidal or suicidal ideation. Thought content does not include homicidal or suicidal plan.        Cognition and Memory: Cognition normal.        Judgment: Judgment normal.    Review of Systems  Psychiatric/Behavioral:  Positive for depression. Negative for hallucinations and suicidal ideas. The patient is nervous/anxious. The patient does not have insomnia.    Blood pressure (!) 134/92, pulse 82, temperature 97.7 F (36.5 C), temperature source Oral, resp. rate 20, height 5' 8 (1.727 m), weight 64.4 kg, SpO2 98%. Body mass index is 21.59 kg/m.   Social History   Tobacco Use  Smoking Status Every Day   Current packs/day: 0.50   Average packs/day: 0.5 packs/day for 35.0 years (17.5 ttl pk-yrs)   Types: Cigarettes  Smokeless Tobacco Former  Tobacco Comments   Patches very rarely   Tobacco Cessation:  A prescription for an FDA-approved tobacco cessation medication provided at discharge   Blood Alcohol level:  Lab Results  Component Value Date   [facility] <15 09/02/2024   ETH <15 06/17/2024    Metabolic Disorder Labs:  Lab Results  Component Value Date   HGBA1C 5.6 06/19/2024   MPG 114 06/19/2024   MPG 108.28 10/05/2021   No  results found for: PROLACTIN Lab Results  Component Value Date   CHOL 168 06/19/2024   TRIG 62 06/19/2024   HDL 58 06/19/2024   CHOLHDL 2.9 06/19/2024   VLDL 12 06/19/2024   LDLCALC 98 06/19/2024   LDLCALC 133 (H) 10/05/2021    See Psychiatric Specialty Exam and Suicide Risk Assessment completed by Attending Physician prior to discharge.  Discharge destination:  Home  Is patient on multiple antipsychotic therapies at discharge:  No   Has Patient had three or more failed trials of antipsychotic monotherapy by history:  No  Recommended Plan for Multiple Antipsychotic Therapies: NA  Discharge Instructions     Diet - low sodium heart healthy   Complete by: As directed    Increase activity slowly   Complete by: As directed  Allergies as of 09/08/2024   No Known Allergies      Medication List     STOP taking these medications    nystatin  powder Commonly known as: MYCOSTATIN /NYSTOP        TAKE these medications      Indication  DULoxetine  60 MG capsule Commonly known as: CYMBALTA  Take 1 capsule (60 mg total) by mouth daily. Start taking on: September 09, 2024  Indication: Generalized Anxiety Disorder, Major Depressive Disorder, Musculoskeletal Pain   folic acid  1 MG tablet Commonly known as: FOLVITE  Take 1 tablet (1 mg total) by mouth daily. Start taking on: September 09, 2024  Indication: folate deficiency   gabapentin  300 MG capsule Commonly known as: NEURONTIN  Take 1 capsule (300 mg total) by mouth 3 (three) times daily. What changed:  medication strength how much to take when to take this  Indication: Neuropathic Pain   hydrOXYzine  50 MG tablet Commonly known as: ATARAX  Take 1 tablet (50 mg total) by mouth daily as needed for anxiety.  Indication: Feeling Anxious   multivitamin with minerals Tabs tablet Take 1 tablet by mouth daily. Start taking on: September 09, 2024  Indication: Vitamin Deficiency   nicotine  21 mg/24hr patch Commonly  known as: NICODERM CQ  - dosed in mg/24 hours Place 1 patch (21 mg total) onto the skin daily. Start taking on: September 09, 2024  Indication: Nicotine  Addiction   thiamine  100 MG tablet Commonly known as: Vitamin B-1 Take 1 tablet (100 mg total) by mouth daily. Start taking on: September 09, 2024  Indication: Deficiency of Vitamin B1   traZODone  100 MG tablet Commonly known as: DESYREL  Take 1 tablet (100 mg total) by mouth at bedtime as needed for sleep. What changed:  medication strength how much to take when to take this reasons to take this  Indication: Trouble Sleeping        Follow-up Information     [facility], [facility] Follow up.   Why: Appointment is scheduled for 09/17/2024 at [facility] information: [address] [place] [address] [phone]                 Follow-up recommendations:  Activity:  as tolerated    Signed: Camelia LITTIE Lukes, PA-C 09/08/2024, 1:16 PM

## 2024-09-08 NOTE — Plan of Care (Signed)
°  Problem: Education: Goal: Emotional status will improve Outcome: Progressing   Problem: Education: Goal: Mental status will improve Outcome: Progressing   Problem: Education: Goal: Knowledge of [facility] Education information/materials will improve Outcome: Progressing

## 2024-09-08 NOTE — Progress Notes (Signed)
 Discharge Note:  Patient denies SI/HI/AVH at this time. Discharge instructions, AVS, prescriptions, and transition record gone over with patient. Patient agrees to comply with medication management, follow-up visit, and outpatient therapy. Patient belongings returned to patient. Patient questions and concerns addressed and answered. Patient ambulatory off unit. Pt provided with paper RX to have filled at any pharmacy

## 2024-09-08 NOTE — Progress Notes (Signed)
   09/07/24 2000  Psych Admission Type (Psych Patients Only)  Admission Status Voluntary  Psychosocial Assessment  Patient Complaints None  Eye Contact Fair  Facial Expression Flat  Affect Appropriate to circumstance  Speech Logical/coherent  Interaction Assertive  Motor Activity Other (Comment) (15 minutes safety checks maintained)  Appearance/Hygiene Unremarkable  Behavior Characteristics Cooperative;Appropriate to situation  Mood Anxious  Thought Process  Coherency WDL  Content WDL  Delusions None reported or observed  Perception WDL  Hallucination None reported or observed  Judgment Impaired  Confusion None  Danger to Self  Current suicidal ideation? Denies  Agreement Not to Harm Self Yes  Description of Agreement verbal  Danger to Others  Danger to Others None reported or observed   Patient alert and oriented x 4, affect is flat but brightens upon approach, his thoughts ae organized a coherent. Patient denies SI/HI/AVH, 15 minutes safety checks maintained.

## 2024-09-08 NOTE — Progress Notes (Signed)
  [facility] Adult Case Management Discharge Plan :  Will you be returning to the same living situation after discharge:  Yes,  pt reports that he is returning home  At discharge, do you have transportation home?: Yes,  CSW to assist with transportation.  Do you have the ability to pay for your medications: Yes,  MEDICAID [place] / MEDICAID [place]  Release of information consent forms completed and in the chart;  Patient's signature needed at discharge.  Patient to Follow up at:  Follow-up Information     [facility] Follow up.   Why: Appointment is scheduled for 09/17/2024 at [facility] information: [address] [place] [address] [phone]                 Next level of care provider has access to [facility] Link:no  Safety Planning and Suicide Prevention discussed: Yes,  SPE completed with the patient.  Patient declined collateral contact.      Has patient been referred to the Quitline?: Patient refused referral for treatment  Patient has been referred for addiction treatment: Yes, the patient will follow up with an outpatient provider for substance use disorder. Therapist: appointment made  Sherryle JINNY Margo, LCSW 09/08/2024, 9:41 AM

## 2024-10-03 NOTE — Anesthesia Postprocedure Evaluation (Signed)
 Patient: Paul Hebert  Scheduled: Procedures: SURG CLO TRACHEOSTOMY/FISTULA; W/PLASTIC REPR  Anesthesia Start Date/Time: 10/03/24 0745   Procedure: SURG CLO TRACHEOSTOMY/FISTULA; W/PLASTIC REPR (Midline: Neck)   Anesthesia type: general   Diagnosis: Tracheocutaneous fistula following tracheostomy    (CMS-HCC)  [J95.04]   Pre-op diagnosis: Tracheocutaneous fistula following tracheostomy     (CMS-HCC) [J95.04]   Location: [facility] 2ND [facility]LR OR 02 / OR [facility]   Surgeons: Denys Reyes Lunger, MD     Final Anesthesia Type: general  Anesthesia Staff: Anesthesiologist: Dala Gustav FALCON, MD Anesthesia Provider: Tawnya Oneil DASEN, MD Anesthesiologist Assistant Student: Lilyan Bouchard      Type Details Placement Removal   Tracheostomy Placement Date: 06/21/22; Placement Time: 1230; Placed By: ENT MD; Jamal AdolphusBETHA Andes; Size: 6; Tube Style: Cuffed; Removal Date: 10/03/24; Removal Time: 0849 06/21/22 1230 by Faustino Eva BROCKS, CRT 10/03/24 0849 by Tawnya Oneil DASEN, MD   Airway Placement Date: 10/03/24; Placement Time: 9246; Pre O2/Mask induction: Preoxygenated with 100% O2 by face mask; Mask ventilation: not attempted; Airway Device: Oral pharyngeal airway; Size: 7; Placed By: Student Anesthesiology Assistant; Insertion attempts: 1; Placement Assessment: Equal Bilateral Breath Sounds, Positive ETCO2; Intubation Trauma: Atraumatic Placement; Removal Date: 10/03/24; Removal Time: 0849 10/03/24 0753 by Tawnya Oneil DASEN, MD 10/03/24 0849 by Tawnya Oneil DASEN, MD       Patient Location: [facility] 2ND [facility]  Last vitals:  Vitals Value Taken Time  BP 109/67 10/03/24 11:00  Temp 36.4 C (97.5 F) 10/03/24 09:01  Pulse 82 10/03/24 11:00  Resp 12 10/03/24 11:00  SpO2 94 % 10/03/24 11:00  SpO2 Pulse 82 10/03/24 11:00      Level of consciousness: sleepy but conscious Post vitals: stable PONV Present? no Pain score: 5 Pain management: satisfactory to patient Airway patency: patent Cardiovascular  status: stable and acceptable Respiratory status: acceptable, nonlabored ventilation, spontaneous ventilation and room air Hydration status: acceptable Comments: Sleeping comfortably    PACU PONV Meds: None  PACU Pain Meds: 10/03/2024  9:21 AM: HYDROmorphone  (DILAUDID ) injection Syrg 0.5 mg Dose: 0.5 mg 10/03/2024  9:30 AM: HYDROmorphone  (DILAUDID ) injection Syrg 0.5 mg Dose: 0.5 mg 10/03/2024  9:50 AM: HYDROmorphone  (DILAUDID ) injection Syrg 0.5 mg Dose: 0.5 mg 10/03/2024  9:20 AM: oxyCODONE  (ROXICODONE ) immediate release tablet 10 mg Dose: 10 mg  Anesthetic complications: There were no known notable events for this encounter.   _______________ Gustav FALCON Dala, MD 10/03/24

## 2024-10-03 NOTE — Unmapped External Note (Signed)
 Otolaryngology Operative Report  Date of Service: 10/03/24 Attending Surgeon: Reyes HERO. Denys, MD Assistants: None  Preoperative Diagnosis: Head and neck cancer, tracheocutaneous fistula, status post radiation  Postoperative Diagnosis:  Same.   Procedure(s) Performed:  Closure of tracheocutaneous fistula  Anesthesia: General endotracheal.   Estimated Blood Loss: Minimal  Complications: None  Specimens:  None  Operative Findings:  1.  Tracheocutaneous fistula closed in layers.  Procedure:  The patient was appropriately identified in the preoperative holding area.  Risks, benefits and alternatives were reviewed with the patient, who agreed to proceed and signed consent.  The patient was subsequently transferred back to the operating room by Anesthesia staff and placed in supine position on the operating table.  Following, first pre-procedural timeout, including patient and procedure verification, general anesthesia was induced without complication.  The patient was intubated, circuit connected and adequate ventilation was confirmed.     Patient was then prepped and draped in the usual sterile fashion.  A second timeout was performed indicate the correct patient and procedure.  The area around the stoma was infiltrated with 3 cc of 1% lidocaine  with 1 100,000 epinephrine .  The skin was incised approximately 1 cm from the introitus of the fistula and skin flaps were elevated circumferentially.  The skin flaps were then imbricated using 4-0 Vicryl to close the fistula.  Skin flaps were then elevated laterally to expose the strap muscles.  These were then closed over top of the fistula using 3-0 Vicryl's as a second layer.  A #10 Blake drain was then placed into the wound.  The skin was closed in layers using 3-0 Vicryl deeply and 4-0 nylon on the skin.  The patient was then turned back over anesthesia who awoken without complication.  He was transferred to the PACU in stable  condition.

## 2024-10-09 ENCOUNTER — Emergency Department
Admission: EM | Admit: 2024-10-09 | Discharge: 2024-10-10 | Disposition: A | Discharge status: Other Acute Inpatient Hospital

## 2024-10-09 ENCOUNTER — Other Ambulatory Visit: Payer: Self-pay

## 2024-10-09 DIAGNOSIS — R000 Tachycardia, unspecified: Secondary | ICD-10-CM | POA: Diagnosis not present

## 2024-10-09 DIAGNOSIS — R45851 Suicidal ideations: Secondary | ICD-10-CM | POA: Diagnosis not present

## 2024-10-09 DIAGNOSIS — J9502 Infection of tracheostomy stoma: Secondary | ICD-10-CM | POA: Insufficient documentation

## 2024-10-09 DIAGNOSIS — Q321 Other congenital malformations of trachea: Secondary | ICD-10-CM | POA: Insufficient documentation

## 2024-10-09 LAB — COMPREHENSIVE METABOLIC PANEL WITH GFR
ALT: 16 U/L (ref 0–44)
AST: 23 U/L (ref 15–41)
Albumin: 4.2 g/dL (ref 3.5–5.0)
Alkaline Phosphatase: 66 U/L (ref 38–126)
Anion gap: 16 — ABNORMAL HIGH (ref 5–15)
BUN: 11 mg/dL (ref 6–20)
CO2: 21 mmol/L — ABNORMAL LOW (ref 22–32)
Calcium: 9.2 mg/dL (ref 8.9–10.3)
Chloride: 100 mmol/L (ref 98–111)
Creatinine, Ser: 0.78 mg/dL (ref 0.61–1.24)
GFR, Estimated: >60 mL/min
Glucose, Bld: 113 mg/dL — ABNORMAL HIGH (ref 70–99)
Potassium: 3.8 mmol/L (ref 3.5–5.1)
Sodium: 137 mmol/L (ref 135–145)
Total Bilirubin: 0.3 mg/dL (ref 0.0–1.2)
Total Protein: 7.6 g/dL (ref 6.5–8.1)

## 2024-10-09 LAB — CBC WITH DIFFERENTIAL/PLATELET
Abs Immature Granulocytes: 0.03 K/uL (ref 0.00–0.07)
Basophils Absolute: 0 K/uL (ref 0.0–0.1)
Basophils Relative: 0 %
Eosinophils Absolute: 0.2 K/uL (ref 0.0–0.5)
Eosinophils Relative: 2 %
HCT: 42.3 % (ref 39.0–52.0)
Hemoglobin: 14.7 g/dL (ref 13.0–17.0)
Immature Granulocytes: 0 %
Lymphocytes Relative: 18 %
Lymphs Abs: 1.5 K/uL (ref 0.7–4.0)
MCH: 33 pg (ref 26.0–34.0)
MCHC: 34.8 g/dL (ref 30.0–36.0)
MCV: 94.8 fL (ref 80.0–100.0)
Monocytes Absolute: 0.5 K/uL (ref 0.1–1.0)
Monocytes Relative: 5 %
Neutro Abs: 6.3 K/uL (ref 1.7–7.7)
Neutrophils Relative %: 75 %
Platelets: 257 K/uL (ref 150–400)
RBC: 4.46 MIL/uL (ref 4.22–5.81)
RDW: 12.6 % (ref 11.5–15.5)
WBC: 8.5 K/uL (ref 4.0–10.5)
nRBC: 0 % (ref 0.0–0.2)

## 2024-10-09 LAB — ETHANOL: Alcohol, Ethyl (B): 115 mg/dL — ABNORMAL HIGH

## 2024-10-09 LAB — LACTIC ACID, PLASMA: Lactic Acid, Venous: 1.3 mmol/L (ref 0.5–1.9)

## 2024-10-09 MED ORDER — MORPHINE SULFATE (PF) 4 MG/ML IV SOLN
4.0000 mg | Freq: Once | INTRAVENOUS | Status: AC
  Administered 2024-10-09: 4 mg via INTRAVENOUS
  Filled 2024-10-09: qty 1

## 2024-10-09 NOTE — ED Triage Notes (Addendum)
 Pt to ED via [facility] from bench on the street. Pt has trach fistula in place and had follow up on Monday and was clear. Pt reports now believes trach fistula is infected. Pt reports daily etoh and last cocaine  use yesterday.   Pt endorses daily SI with a plan to drink himself and get high.   HR 102 97% RA  142/97

## 2024-10-09 NOTE — ED Notes (Addendum)
 Knife found on pt - obtained and locked away with security

## 2024-10-09 NOTE — ED Provider Notes (Signed)
 "  [facility] Provider Note    Event Date/Time   First MD Initiated Contact with Patient 10/09/24 2120     (approximate)   History   Post-op Problem and Suicidal   HPI  Paul Hebert is a 53 y.o. male presenting with concern of infected surgical site.  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  Seems that recently was seen to have a layered closure and repair of the fistula.  Most recently seen by his ENT at [facility] on the 22nd.  Just recently postoperative on 19 December.  He states that he feels like the fistula has become infected, he has noticed some pustular discharge coming from the area as well as some erythema and he is having some trouble swallowing due to swelling developing along the area.  He has not had any fevers or chills but complains of significant pain for which nothing has been helping control the pain.  He also endorses suicidal ideations, he states he just cannot deal with this anymore and he wants to end his life with plan of drinking himself to death or overdosing on something.     Physical Exam   Triage Vital Signs: ED Triage Vitals [10/09/24 2009]  Encounter Vitals Group     BP 104/81     Girls Systolic BP Percentile      Girls Diastolic BP Percentile      Boys Systolic BP Percentile      Boys Diastolic BP Percentile      Pulse Rate (!) 106     Resp 16     Temp 98.9 F (37.2 C)     Temp Source Oral     SpO2 98 %     Weight      Height      Head Circumference      Peak Flow      Pain Score 10     Pain Loc      Pain Education      Exclude from Growth Chart     Most recent vital signs: Vitals:   10/09/24 2009  BP: 104/81  Pulse: (!) 106  Resp: 16  Temp: 98.9 F (37.2 C)  SpO2: 98%     General: Awake, appears uncomfortable Neck:  There does appear to be some mild erythema along the surgical site, as well as some scant discharge appreciated.  The  area of the tracheostomy is still slightly open, I do not appreciate any crepitus along the area CV:  Good peripheral perfusion.  Slightly tachycardic Resp:  Normal effort.  Able to speak in full sentences Abd:  No distention.  Soft nontender Other:     ED Results / Procedures / Treatments   Labs (all labs ordered are listed, but only abnormal results are displayed) Labs Reviewed  COMPREHENSIVE METABOLIC PANEL WITH GFR - Abnormal; Notable for the following components:      Result Value   CO2 21 (*)    Glucose, Bld 113 (*)    Anion gap 16 (*)    All other components within normal limits  ETHANOL - Abnormal; Notable for the following components:   Alcohol, Ethyl (B) 115 (*)    All other components within normal limits  LACTIC ACID, PLASMA  CBC WITH DIFFERENTIAL/PLATELET  LACTIC ACID, PLASMA  URINE DRUG SCREEN     EKG     RADIOLOGY   PROCEDURES:  Critical Care  performed: No  Procedures   MEDICATIONS ORDERED IN ED: Medications  morphine  (PF) 4 MG/ML injection 4 mg (4 mg Intravenous Given 10/09/24 2206)     IMPRESSION / MDM / ASSESSMENT AND PLAN / ED COURSE  I reviewed the triage vital signs and the nursing notes.                               Patient's presentation is most consistent with acute complicated illness / injury requiring diagnostic workup.  53 year old male with history of a tracheostomy requiring repair secondary to a fistula that developed and is about 6 days postop from repair of the fistula presenting with concern of infection along the tracheostomy site.  He appears very uncomfortable fortunately his vitals are reassuring and the tracheostomy site does not appear to be grossly infected, and may be postsurgical changes that I am appreciating, he does not have any leukocytosis and lactic acid here is normal.  Given the findings ordering CT imaging of the neck, will follow-up results of this and determine further workup accordingly.       FINAL  CLINICAL IMPRESSION(S) / ED DIAGNOSES   Final diagnoses:  Tracheal fistula     Rx / DC Orders   ED Discharge Orders     None        Note:  This document was prepared using Dragon voice recognition software and may include unintentional dictation errors.   Fernand Rossie HERO, MD 10/09/24 2237  "

## 2024-10-09 NOTE — ED Notes (Signed)
 Pt arrived back in from outside, wishes to be seen by doc now.

## 2024-10-09 NOTE — ED Notes (Addendum)
 Pt dressed out by this RN and EDT Ariel  1 red bandana 1 black pants 1 blue striped underwear 1 pair gray socks 1 pair black socks 1 black jacket 1 gray sports coat 1 gray long sleeve shirt  1 pair black shoes 1 blue bag 1 cell phone 1 wallet 1 vape pen 1 pair glasses  1 set of keys 1 pack of cigarettes 1 belt

## 2024-10-10 ENCOUNTER — Emergency Department

## 2024-10-10 MED ORDER — HYDROMORPHONE HCL 1 MG/ML IJ SOLN
0.5000 mg | Freq: Once | INTRAMUSCULAR | Status: AC
  Administered 2024-10-10: 0.5 mg via INTRAVENOUS
  Filled 2024-10-10: qty 0.5

## 2024-10-10 MED ORDER — LACTATED RINGERS IV BOLUS
1000.0000 mL | Freq: Once | INTRAVENOUS | Status: AC
  Administered 2024-10-10: 1000 mL via INTRAVENOUS

## 2024-10-10 MED ORDER — MORPHINE SULFATE (PF) 4 MG/ML IV SOLN
4.0000 mg | Freq: Once | INTRAVENOUS | Status: AC
  Administered 2024-10-10: 4 mg via INTRAVENOUS
  Filled 2024-10-10: qty 1

## 2024-10-10 MED ORDER — IOHEXOL 300 MG/ML  SOLN
75.0000 mL | Freq: Once | INTRAMUSCULAR | Status: AC | PRN
  Administered 2024-10-10: 75 mL via INTRAVENOUS

## 2024-10-10 MED ORDER — SODIUM CHLORIDE 0.9 % IV SOLN
2.0000 g | INTRAVENOUS | Status: AC
  Administered 2024-10-10: 2 g via INTRAVENOUS
  Filled 2024-10-10: qty 12.5

## 2024-10-10 MED ORDER — VANCOMYCIN HCL 1500 MG/300ML IV SOLN
1500.0000 mg | Freq: Once | INTRAVENOUS | Status: AC
  Administered 2024-10-10: 1500 mg via INTRAVENOUS
  Filled 2024-10-10: qty 300

## 2024-10-10 NOTE — ED Notes (Signed)
 Called to [facility] per MD Gordan @146am Wallace Faxed & Images [employer]/Rep Amy

## 2024-10-10 NOTE — Progress Notes (Signed)
 ED Pharmacy Antibiotic Sign Off An antibiotic consult was received from an ED provider for Vancomycin  per pharmacy dosing for probable infection of tracheostomy site after recent surgery at [facility] . A chart review was completed to assess appropriateness.   The following one time order(s) were placed:  Vancomycin  1500 mg per pt wt: 64.4 kg from [facility] note on 09/08/24  Further antibiotic and/or antibiotic pharmacy consults should be ordered by the admitting provider if indicated.   Thank you for allowing pharmacy to be a part of this patient's care.   Thank you, Rankin CANDIE Dills, PharmD, [facility] 10/10/2024 1:23 AM

## 2024-10-10 NOTE — ED Provider Notes (Signed)
----------------------------------------- °  12:13 AM on 10/10/2024 -----------------------------------------  Blood pressure 104/81, pulse (!) 106, temperature 98.9 F (37.2 C), temperature source Oral, resp. rate 16, SpO2 98%.   Assuming care from Dr. Fernand.  In short, Paul Hebert is a 53 y.o. male with a chief complaint of surgical site complication, as well as passive SI in the setting of throat/neck cancer.  Refer to the original H&P for additional details.  The current plan of care is to follow up on CT and reassess.  Patient is about 6 days out from last ENT surgery at [facility].   Clinical Course as of 10/10/24 0716  Fri Oct 10, 2024  0140 CT Soft Tissue Neck W Contrast I independently viewed and interpreted the patient's neck CT and it is difficult to appreciate given the recent surgeries but there is no obvious drainable abscess.  Radiology commented that there appears to be edema consistent with new infection.  I reassessed the patient and he is breathing comfortably.  He has a trail of purulent material draining from the tracheostomy/surgical site.  There is an area of erythema and swelling just to the left of the tracheostomy site that is also the area of greatest tenderness.  I ordered cefepime  2 g IV and vancomycin  per pharmacy consult for broad-spectrum antibiotic treatment.  I made the patient n.p.o. and ordered LR 1 L bolus.  Morphine  4 mg IV for pain.  Upon reassessment, we discussed his need for transfer back to [facility] with which he agreed.  We also discussed the feelings of helplessness and hopelessness he expressed earlier.  While he is having passive suicidal ideation, he is not wanting to kill himself, he is just feeling hopeless after having such a long course of his illness.  He is willing to contract for safety and wants to get help and treatment.  He does not meet involuntary commitment criteria but may benefit from psychiatric consultation once his urgent medical issues have  been addressed [CF]  0157 Spoke with Amy at [facility] logistic center, she will put me in touch with ENT shortly [CF]  0217 Consulted by phone with Dr. Kathlean Boehringer with [facility].  We discussed the case and she agrees with the current plan and will accept the patient for transfer to a floor bed.  They will call back with bed assignment.  I also updated her regarding the passive SI [CF]  0715 Patient still awaiting transport to [facility].  Transferring ED care to Dr. Ernest [CF]    Clinical Course User Index [CF] Gordan Huxley, MD     Medications  morphine  (PF) 4 MG/ML injection 4 mg (4 mg Intravenous Given 10/09/24 2206)  iohexol  (OMNIPAQUE ) 300 MG/ML solution 75 mL (75 mLs Intravenous Contrast Given 10/10/24 0010)  morphine  (PF) 4 MG/ML injection 4 mg (4 mg Intravenous Given 10/10/24 0030)  ceFEPIme  (MAXIPIME ) 2 g in sodium chloride  0.9 % 100 mL IVPB (0 g Intravenous Stopped 10/10/24 0218)  vancomycin  (VANCOREADY) IVPB 1500 mg/300 mL (0 mg Intravenous Stopped 10/10/24 0413)  morphine  (PF) 4 MG/ML injection 4 mg (4 mg Intravenous Given 10/10/24 0204)  lactated ringers  bolus 1,000 mL (0 mLs Intravenous Stopped 10/10/24 0415)     ED Discharge Orders     None      Final diagnoses:  Tracheal fistula  Tracheostomy infection (HCC)     Gordan Huxley, MD 10/10/24 [phone]

## 2024-10-10 NOTE — ED Notes (Signed)
 [facility] transport called  nothing available, called [facility] spoke to Nataya for transport to [facility] Room [phone]   will transport

## 2024-10-10 NOTE — ED Notes (Signed)
 Spoke Rep Maudie from [place] at 2:28am, and the patient bed assignment is 6- acute neurosurgery (ent) Bed# 6415. Report can be called at [phone] option 2. Rep maudie said that they sent message over to [facility] for transport. They will call back when [facility] is available.

## 2024-10-15 LAB — AEROBIC/ANAEROBIC CULTURE W GRAM STAIN (SURGICAL/DEEP WOUND)
